# Patient Record
Sex: Male | Born: 1960 | Race: Black or African American | Hispanic: No | Marital: Single | Smoking: Never smoker
Health system: Southern US, Community
[De-identification: ages and names within clinical notes are randomized; demographics above are authoritative.]

## PROBLEM LIST (undated history)

## (undated) DIAGNOSIS — K3184 Gastroparesis: Secondary | ICD-10-CM

## (undated) DIAGNOSIS — N400 Enlarged prostate without lower urinary tract symptoms: Secondary | ICD-10-CM

## (undated) DIAGNOSIS — E1101 Type 2 diabetes mellitus with hyperosmolarity with coma: Secondary | ICD-10-CM

## (undated) DIAGNOSIS — F32A Depression, unspecified: Secondary | ICD-10-CM

## (undated) DIAGNOSIS — J189 Pneumonia, unspecified organism: Secondary | ICD-10-CM

## (undated) DIAGNOSIS — F329 Major depressive disorder, single episode, unspecified: Secondary | ICD-10-CM

## (undated) DIAGNOSIS — E871 Hypo-osmolality and hyponatremia: Secondary | ICD-10-CM

## (undated) DIAGNOSIS — R739 Hyperglycemia, unspecified: Secondary | ICD-10-CM

## (undated) DIAGNOSIS — F840 Autistic disorder: Secondary | ICD-10-CM

## (undated) DIAGNOSIS — R339 Retention of urine, unspecified: Secondary | ICD-10-CM

## (undated) DIAGNOSIS — F79 Unspecified intellectual disabilities: Secondary | ICD-10-CM

## (undated) DIAGNOSIS — F940 Selective mutism: Secondary | ICD-10-CM

## (undated) DIAGNOSIS — N390 Urinary tract infection, site not specified: Secondary | ICD-10-CM

## (undated) DIAGNOSIS — N289 Disorder of kidney and ureter, unspecified: Secondary | ICD-10-CM

---

## 2003-11-16 ENCOUNTER — Other Ambulatory Visit: Payer: Self-pay

## 2006-07-10 ENCOUNTER — Emergency Department: Payer: Self-pay | Admitting: Emergency Medicine

## 2009-07-27 ENCOUNTER — Emergency Department: Payer: Self-pay | Admitting: Emergency Medicine

## 2010-01-11 ENCOUNTER — Inpatient Hospital Stay: Payer: Self-pay | Admitting: Internal Medicine

## 2010-02-08 ENCOUNTER — Inpatient Hospital Stay: Payer: Self-pay | Admitting: Internal Medicine

## 2010-03-25 ENCOUNTER — Emergency Department: Payer: Self-pay | Admitting: Emergency Medicine

## 2010-05-13 ENCOUNTER — Emergency Department (HOSPITAL_COMMUNITY): Admission: EM | Admit: 2010-05-13 | Discharge: 2010-05-13 | Payer: Self-pay | Admitting: Emergency Medicine

## 2010-05-28 ENCOUNTER — Emergency Department (HOSPITAL_COMMUNITY): Admission: EM | Admit: 2010-05-28 | Discharge: 2010-05-29 | Payer: Self-pay | Admitting: Emergency Medicine

## 2010-06-19 ENCOUNTER — Inpatient Hospital Stay (HOSPITAL_COMMUNITY): Admission: EM | Admit: 2010-06-19 | Discharge: 2010-06-22 | Payer: Self-pay | Admitting: Emergency Medicine

## 2010-06-25 ENCOUNTER — Emergency Department (HOSPITAL_COMMUNITY): Admission: EM | Admit: 2010-06-25 | Discharge: 2010-06-25 | Payer: Self-pay | Admitting: Emergency Medicine

## 2010-07-11 ENCOUNTER — Emergency Department (HOSPITAL_COMMUNITY)
Admission: EM | Admit: 2010-07-11 | Discharge: 2010-07-11 | Payer: Self-pay | Source: Home / Self Care | Admitting: Emergency Medicine

## 2010-07-19 ENCOUNTER — Emergency Department (HOSPITAL_COMMUNITY): Admission: EM | Admit: 2010-07-19 | Discharge: 2010-07-19 | Payer: Self-pay | Admitting: Emergency Medicine

## 2010-07-25 ENCOUNTER — Emergency Department (HOSPITAL_COMMUNITY)
Admission: EM | Admit: 2010-07-25 | Discharge: 2010-07-25 | Payer: Self-pay | Source: Home / Self Care | Admitting: Emergency Medicine

## 2010-08-14 ENCOUNTER — Emergency Department (HOSPITAL_COMMUNITY)
Admission: EM | Admit: 2010-08-14 | Discharge: 2010-08-14 | Payer: Self-pay | Source: Home / Self Care | Admitting: Emergency Medicine

## 2010-09-24 ENCOUNTER — Emergency Department (HOSPITAL_COMMUNITY)
Admission: EM | Admit: 2010-09-24 | Discharge: 2010-09-24 | Payer: Self-pay | Source: Home / Self Care | Admitting: Emergency Medicine

## 2010-09-26 LAB — DIFFERENTIAL
Basophils Absolute: 0 10*3/uL (ref 0.0–0.1)
Basophils Relative: 1 % (ref 0–1)
Eosinophils Absolute: 0.1 10*3/uL (ref 0.0–0.7)
Neutro Abs: 4.5 10*3/uL (ref 1.7–7.7)
Neutrophils Relative %: 70 % (ref 43–77)

## 2010-09-26 LAB — BASIC METABOLIC PANEL
CO2: 27 mEq/L (ref 19–32)
Calcium: 9.2 mg/dL (ref 8.4–10.5)
Chloride: 100 mEq/L (ref 96–112)
GFR calc Af Amer: >60 mL/min (ref >60)
Sodium: 133 mEq/L — ABNORMAL LOW (ref 135–145)

## 2010-09-26 LAB — GLUCOSE, CAPILLARY
Glucose-Capillary: 52 mg/dL — ABNORMAL LOW (ref 70–99)
Glucose-Capillary: 165 mg/dL — ABNORMAL HIGH (ref 70–99)

## 2010-09-26 LAB — URINE MICROSCOPIC-ADD ON

## 2010-09-26 LAB — CBC
Hemoglobin: 11 g/dL — ABNORMAL LOW (ref 13.0–17.0)
Platelets: 181 10*3/uL (ref 150–400)
RBC: 4.16 MIL/uL — ABNORMAL LOW (ref 4.22–5.81)

## 2010-09-26 LAB — URINALYSIS, ROUTINE W REFLEX MICROSCOPIC
Specific Gravity, Urine: 1.01 (ref 1.005–1.030)
Urine Glucose, Fasting: NEGATIVE mg/dL
pH: 6 (ref 5.0–8.0)

## 2010-09-27 LAB — URINE CULTURE: Colony Count: >=100000

## 2010-11-14 LAB — BASIC METABOLIC PANEL
BUN: 29 mg/dL — ABNORMAL HIGH (ref 6–23)
BUN: 18 mg/dL (ref 6–23)
Calcium: 9 mg/dL (ref 8.4–10.5)
Calcium: 9.2 mg/dL (ref 8.4–10.5)
Calcium: 9.3 mg/dL (ref 8.4–10.5)
Chloride: 101 mEq/L (ref 96–112)
GFR calc non Af Amer: >60 mL/min (ref >60)
GFR calc non Af Amer: >60 mL/min (ref >60)
GFR calc non Af Amer: >60 mL/min (ref >60)
GFR calc non Af Amer: >60 mL/min (ref >60)
Glucose, Bld: 121 mg/dL — ABNORMAL HIGH (ref 70–99)
Glucose, Bld: 149 mg/dL — ABNORMAL HIGH (ref 70–99)
Glucose, Bld: 160 mg/dL — ABNORMAL HIGH (ref 70–99)
Potassium: 3.3 mEq/L — ABNORMAL LOW (ref 3.5–5.1)
Potassium: 3.5 mEq/L (ref 3.5–5.1)
Potassium: 3.5 mEq/L (ref 3.5–5.1)
Sodium: 135 mEq/L (ref 135–145)
Sodium: 139 mEq/L (ref 135–145)
Sodium: 142 mEq/L (ref 135–145)

## 2010-11-14 LAB — URINALYSIS, ROUTINE W REFLEX MICROSCOPIC
Bilirubin Urine: NEGATIVE
Glucose, UA: 100 mg/dL — AB
Nitrite: POSITIVE — AB
Specific Gravity, Urine: 1.02 (ref 1.005–1.030)
Specific Gravity, Urine: 1.02 (ref 1.005–1.030)
Urobilinogen, UA: 0.2 mg/dL (ref 0.0–1.0)
Urobilinogen, UA: 0.2 mg/dL (ref 0.0–1.0)
pH: 6.5 (ref 5.0–8.0)
pH: 6 (ref 5.0–8.0)

## 2010-11-14 LAB — URINE MICROSCOPIC-ADD ON

## 2010-11-14 LAB — DIFFERENTIAL
Basophils Absolute: 0 10*3/uL (ref 0.0–0.1)
Basophils Relative: 0 % (ref 0–1)
Lymphocytes Relative: 11 % — ABNORMAL LOW (ref 12–46)
Monocytes Relative: 6 % (ref 3–12)
Neutro Abs: 5.7 10*3/uL (ref 1.7–7.7)
Neutrophils Relative %: 82 % — ABNORMAL HIGH (ref 43–77)

## 2010-11-14 LAB — URINE CULTURE: Colony Count: >=100000

## 2010-11-14 LAB — GLUCOSE, CAPILLARY
Glucose-Capillary: 157 mg/dL — ABNORMAL HIGH (ref 70–99)
Glucose-Capillary: 161 mg/dL — ABNORMAL HIGH (ref 70–99)
Glucose-Capillary: 131 mg/dL — ABNORMAL HIGH (ref 70–99)
Glucose-Capillary: 154 mg/dL — ABNORMAL HIGH (ref 70–99)
Glucose-Capillary: 54 mg/dL — ABNORMAL LOW (ref 70–99)

## 2010-11-14 LAB — CBC
HCT: 32 % — ABNORMAL LOW (ref 39.0–52.0)
Hemoglobin: 10.6 g/dL — ABNORMAL LOW (ref 13.0–17.0)
Hemoglobin: 10.4 g/dL — ABNORMAL LOW (ref 13.0–17.0)
MCHC: 33.5 g/dL (ref 30.0–36.0)
MCV: 76.9 fL — ABNORMAL LOW (ref 78.0–100.0)
RDW: 14.3 % (ref 11.5–15.5)
RDW: 14.9 % (ref 11.5–15.5)
WBC: 6.3 10*3/uL (ref 4.0–10.5)
WBC: 6.9 10*3/uL (ref 4.0–10.5)

## 2010-11-14 LAB — LACTIC ACID, PLASMA: Lactic Acid, Venous: 2.5 mmol/L — ABNORMAL HIGH (ref 0.5–2.2)

## 2010-11-15 LAB — GLUCOSE, CAPILLARY
Glucose-Capillary: 115 mg/dL — ABNORMAL HIGH (ref 70–99)
Glucose-Capillary: 130 mg/dL — ABNORMAL HIGH (ref 70–99)
Glucose-Capillary: 140 mg/dL — ABNORMAL HIGH (ref 70–99)
Glucose-Capillary: 141 mg/dL — ABNORMAL HIGH (ref 70–99)
Glucose-Capillary: 143 mg/dL — ABNORMAL HIGH (ref 70–99)
Glucose-Capillary: 184 mg/dL — ABNORMAL HIGH (ref 70–99)
Glucose-Capillary: 203 mg/dL — ABNORMAL HIGH (ref 70–99)
Glucose-Capillary: 207 mg/dL — ABNORMAL HIGH (ref 70–99)
Glucose-Capillary: 228 mg/dL — ABNORMAL HIGH (ref 70–99)
Glucose-Capillary: 275 mg/dL — ABNORMAL HIGH (ref 70–99)
Glucose-Capillary: 304 mg/dL — ABNORMAL HIGH (ref 70–99)
Glucose-Capillary: 416 mg/dL — ABNORMAL HIGH (ref 70–99)
Glucose-Capillary: 55 mg/dL — ABNORMAL LOW (ref 70–99)
Glucose-Capillary: 58 mg/dL — ABNORMAL LOW (ref 70–99)
Glucose-Capillary: 60 mg/dL — ABNORMAL LOW (ref 70–99)
Glucose-Capillary: 61 mg/dL — ABNORMAL LOW (ref 70–99)
Glucose-Capillary: 67 mg/dL — ABNORMAL LOW (ref 70–99)
Glucose-Capillary: 96 mg/dL (ref 70–99)

## 2010-11-15 LAB — MAGNESIUM: Magnesium: 2.5 mg/dL (ref 1.5–2.5)

## 2010-11-15 LAB — URINE CULTURE

## 2010-11-15 LAB — URINALYSIS, ROUTINE W REFLEX MICROSCOPIC
Glucose, UA: >1000 mg/dL — AB
Leukocytes, UA: NEGATIVE
Nitrite: NEGATIVE
Specific Gravity, Urine: >1.03 — ABNORMAL HIGH (ref 1.005–1.030)
Urobilinogen, UA: 1 mg/dL (ref 0.0–1.0)
pH: 5.5 (ref 5.0–8.0)

## 2010-11-15 LAB — COMPREHENSIVE METABOLIC PANEL
ALT: 42 U/L (ref 0–53)
AST: 39 U/L — ABNORMAL HIGH (ref 0–37)
AST: 50 U/L — ABNORMAL HIGH (ref 0–37)
Albumin: 3.7 g/dL (ref 3.5–5.2)
Albumin: 4.5 g/dL (ref 3.5–5.2)
BUN: 53 mg/dL — ABNORMAL HIGH (ref 6–23)
CO2: 19 mEq/L (ref 19–32)
Calcium: 10 mg/dL (ref 8.4–10.5)
Calcium: 9 mg/dL (ref 8.4–10.5)
Calcium: 9.3 mg/dL (ref 8.4–10.5)
Creatinine, Ser: 2.19 mg/dL — ABNORMAL HIGH (ref 0.4–1.5)
Creatinine, Ser: 2.25 mg/dL — ABNORMAL HIGH (ref 0.4–1.5)
Creatinine, Ser: 2.91 mg/dL — ABNORMAL HIGH (ref 0.4–1.5)
GFR calc Af Amer: 38 mL/min — ABNORMAL LOW (ref >60)
GFR calc Af Amer: 39 mL/min — ABNORMAL LOW (ref >60)
GFR calc non Af Amer: 32 mL/min — ABNORMAL LOW (ref >60)
Glucose, Bld: 434 mg/dL — ABNORMAL HIGH (ref 70–99)
Sodium: 133 mEq/L — ABNORMAL LOW (ref 135–145)
Total Bilirubin: 1.3 mg/dL — ABNORMAL HIGH (ref 0.3–1.2)
Total Protein: 6.8 g/dL (ref 6.0–8.3)
Total Protein: 8.5 g/dL — ABNORMAL HIGH (ref 6.0–8.3)

## 2010-11-15 LAB — HEMOGLOBIN A1C: Hgb A1c MFr Bld: 9.4 % — ABNORMAL HIGH (ref <5.7)

## 2010-11-15 LAB — BASIC METABOLIC PANEL
BUN: 25 mg/dL — ABNORMAL HIGH (ref 6–23)
Chloride: 110 mEq/L (ref 96–112)
Creatinine, Ser: 1.43 mg/dL (ref 0.4–1.5)
Glucose, Bld: 126 mg/dL — ABNORMAL HIGH (ref 70–99)

## 2010-11-15 LAB — CBC
MCH: 26.7 pg (ref 26.0–34.0)
MCH: 26.9 pg (ref 26.0–34.0)
MCH: 26.9 pg (ref 26.0–34.0)
MCHC: 33.7 g/dL (ref 30.0–36.0)
MCHC: 34 g/dL (ref 30.0–36.0)
MCHC: 34.2 g/dL (ref 30.0–36.0)
MCV: 78.8 fL (ref 78.0–100.0)
Platelets: 186 10*3/uL (ref 150–400)
Platelets: 198 10*3/uL (ref 150–400)
RDW: 14.3 % (ref 11.5–15.5)
RDW: 14.4 % (ref 11.5–15.5)
RDW: 14.9 % (ref 11.5–15.5)

## 2010-11-15 LAB — DIFFERENTIAL
Basophils Absolute: 0.2 10*3/uL — ABNORMAL HIGH (ref 0.0–0.1)
Eosinophils Absolute: 0 10*3/uL (ref 0.0–0.7)
Eosinophils Relative: 1 % (ref 0–5)
Lymphocytes Relative: 12 % (ref 12–46)
Lymphocytes Relative: 20 % (ref 12–46)
Lymphocytes Relative: 22 % (ref 12–46)
Lymphs Abs: 0.9 10*3/uL (ref 0.7–4.0)
Lymphs Abs: 1.7 10*3/uL (ref 0.7–4.0)
Monocytes Absolute: 0.8 10*3/uL (ref 0.1–1.0)
Monocytes Absolute: 0.8 10*3/uL (ref 0.1–1.0)
Monocytes Relative: 8 % (ref 3–12)
Monocytes Relative: 9 % (ref 3–12)
Neutro Abs: 5.3 10*3/uL (ref 1.7–7.7)
Neutrophils Relative %: 79 % — ABNORMAL HIGH (ref 43–77)

## 2010-11-15 LAB — URINE MICROSCOPIC-ADD ON

## 2010-11-15 LAB — KETONES, QUALITATIVE

## 2010-11-15 LAB — LIPID PANEL
Cholesterol: 202 mg/dL — ABNORMAL HIGH (ref 0–200)
Total CHOL/HDL Ratio: 3.8 RATIO

## 2010-11-15 LAB — PHOSPHORUS: Phosphorus: 3.1 mg/dL (ref 2.3–4.6)

## 2010-11-15 LAB — TSH: TSH: 0.507 u[IU]/mL (ref 0.350–4.500)

## 2010-11-16 LAB — COMPREHENSIVE METABOLIC PANEL WITH GFR
ALT: 37 U/L (ref 0–53)
AST: 50 U/L — ABNORMAL HIGH (ref 0–37)
Albumin: 3.8 g/dL (ref 3.5–5.2)
Alkaline Phosphatase: 79 U/L (ref 39–117)
BUN: 24 mg/dL — ABNORMAL HIGH (ref 6–23)
CO2: 24 meq/L (ref 19–32)
Calcium: 9.3 mg/dL (ref 8.4–10.5)
Chloride: 108 meq/L (ref 96–112)
Creatinine, Ser: 1.53 mg/dL — ABNORMAL HIGH (ref 0.4–1.5)
GFR calc non Af Amer: 49 mL/min — ABNORMAL LOW
Glucose, Bld: 56 mg/dL — ABNORMAL LOW (ref 70–99)
Potassium: 3.9 meq/L (ref 3.5–5.1)
Sodium: 140 meq/L (ref 135–145)
Total Bilirubin: 1 mg/dL (ref 0.3–1.2)
Total Protein: 7.4 g/dL (ref 6.0–8.3)

## 2010-11-16 LAB — URINE MICROSCOPIC-ADD ON

## 2010-11-16 LAB — DIFFERENTIAL
Basophils Absolute: 0.1 10*3/uL (ref 0.0–0.1)
Basophils Relative: 1 % (ref 0–1)
Lymphocytes Relative: 26 % (ref 12–46)
Monocytes Relative: 9 % (ref 3–12)
Neutro Abs: 4.3 10*3/uL (ref 1.7–7.7)
Neutrophils Relative %: 61 % (ref 43–77)

## 2010-11-16 LAB — URINE CULTURE
Colony Count: >=100000
Culture  Setup Time: 201109262016

## 2010-11-16 LAB — CBC
HCT: 33.9 % — ABNORMAL LOW (ref 39.0–52.0)
MCH: 26.6 pg (ref 26.0–34.0)
MCV: 79.3 fL (ref 78.0–100.0)
RDW: 14.3 % (ref 11.5–15.5)
WBC: 7 10*3/uL (ref 4.0–10.5)

## 2010-11-16 LAB — GLUCOSE, CAPILLARY
Glucose-Capillary: 119 mg/dL — ABNORMAL HIGH (ref 70–99)
Glucose-Capillary: 79 mg/dL (ref 70–99)

## 2010-11-16 LAB — URINALYSIS, ROUTINE W REFLEX MICROSCOPIC
Nitrite: POSITIVE — AB
Specific Gravity, Urine: 1.02 (ref 1.005–1.030)
Urobilinogen, UA: 1 mg/dL (ref 0.0–1.0)

## 2010-11-30 ENCOUNTER — Emergency Department (HOSPITAL_COMMUNITY): Payer: Medicaid Other

## 2010-11-30 ENCOUNTER — Emergency Department (HOSPITAL_COMMUNITY)
Admission: EM | Admit: 2010-11-30 | Discharge: 2010-11-30 | Disposition: A | Discharge status: Home / Self Care | Payer: Medicaid Other | Attending: Emergency Medicine | Admitting: Emergency Medicine

## 2010-11-30 DIAGNOSIS — R209 Unspecified disturbances of skin sensation: Secondary | ICD-10-CM | POA: Insufficient documentation

## 2010-11-30 DIAGNOSIS — I10 Essential (primary) hypertension: Secondary | ICD-10-CM | POA: Insufficient documentation

## 2010-11-30 DIAGNOSIS — Z79899 Other long term (current) drug therapy: Secondary | ICD-10-CM | POA: Insufficient documentation

## 2010-11-30 DIAGNOSIS — N390 Urinary tract infection, site not specified: Secondary | ICD-10-CM | POA: Insufficient documentation

## 2010-11-30 DIAGNOSIS — E1169 Type 2 diabetes mellitus with other specified complication: Secondary | ICD-10-CM | POA: Insufficient documentation

## 2010-11-30 LAB — GLUCOSE, CAPILLARY: Glucose-Capillary: 143 mg/dL — ABNORMAL HIGH (ref 70–99)

## 2010-11-30 LAB — URINE MICROSCOPIC-ADD ON

## 2010-11-30 LAB — URINALYSIS, ROUTINE W REFLEX MICROSCOPIC
Bilirubin Urine: NEGATIVE
Ketones, ur: NEGATIVE mg/dL
Nitrite: NEGATIVE
Protein, ur: NEGATIVE mg/dL
pH: 7 (ref 5.0–8.0)

## 2010-12-02 ENCOUNTER — Emergency Department (HOSPITAL_COMMUNITY)
Admission: EM | Admit: 2010-12-02 | Discharge: 2010-12-02 | Disposition: A | Discharge status: Home / Self Care | Payer: Medicaid Other | Attending: Emergency Medicine | Admitting: Emergency Medicine

## 2010-12-02 DIAGNOSIS — F3289 Other specified depressive episodes: Secondary | ICD-10-CM | POA: Insufficient documentation

## 2010-12-02 DIAGNOSIS — F840 Autistic disorder: Secondary | ICD-10-CM | POA: Insufficient documentation

## 2010-12-02 DIAGNOSIS — F329 Major depressive disorder, single episode, unspecified: Secondary | ICD-10-CM | POA: Insufficient documentation

## 2010-12-02 DIAGNOSIS — I10 Essential (primary) hypertension: Secondary | ICD-10-CM | POA: Insufficient documentation

## 2010-12-02 DIAGNOSIS — E1169 Type 2 diabetes mellitus with other specified complication: Secondary | ICD-10-CM | POA: Insufficient documentation

## 2010-12-02 LAB — BASIC METABOLIC PANEL
CO2: 28 mEq/L (ref 19–32)
Chloride: 101 mEq/L (ref 96–112)
GFR calc Af Amer: >60 mL/min (ref >60)
Sodium: 137 mEq/L (ref 135–145)

## 2010-12-02 LAB — CBC
HCT: 33.4 % — ABNORMAL LOW (ref 39.0–52.0)
Hemoglobin: 10.7 g/dL — ABNORMAL LOW (ref 13.0–17.0)
MCH: 25.8 pg — ABNORMAL LOW (ref 26.0–34.0)
MCHC: 32 g/dL (ref 30.0–36.0)
MCV: 80.7 fL (ref 78.0–100.0)

## 2010-12-02 LAB — DIFFERENTIAL
Lymphocytes Relative: 14 % (ref 12–46)
Monocytes Absolute: 0.5 10*3/uL (ref 0.1–1.0)
Monocytes Relative: 8 % (ref 3–12)
Neutro Abs: 4.6 10*3/uL (ref 1.7–7.7)

## 2010-12-02 LAB — GLUCOSE, CAPILLARY: Glucose-Capillary: 406 mg/dL — ABNORMAL HIGH (ref 70–99)

## 2010-12-04 LAB — URINE CULTURE: Colony Count: >=100000

## 2010-12-16 ENCOUNTER — Inpatient Hospital Stay (HOSPITAL_COMMUNITY)
Admission: EM | Admit: 2010-12-16 | Discharge: 2010-12-25 | DRG: 638 | Disposition: A | Discharge status: Nursing Home (Includes ICF) | Payer: Medicaid Other | Attending: Internal Medicine | Admitting: Internal Medicine

## 2010-12-16 ENCOUNTER — Emergency Department (HOSPITAL_COMMUNITY): Payer: Medicaid Other

## 2010-12-16 DIAGNOSIS — E1069 Type 1 diabetes mellitus with other specified complication: Principal | ICD-10-CM | POA: Diagnosis present

## 2010-12-16 DIAGNOSIS — R339 Retention of urine, unspecified: Secondary | ICD-10-CM | POA: Diagnosis present

## 2010-12-16 DIAGNOSIS — E871 Hypo-osmolality and hyponatremia: Secondary | ICD-10-CM | POA: Diagnosis present

## 2010-12-16 DIAGNOSIS — N390 Urinary tract infection, site not specified: Secondary | ICD-10-CM | POA: Diagnosis present

## 2010-12-16 DIAGNOSIS — E1065 Type 1 diabetes mellitus with hyperglycemia: Principal | ICD-10-CM | POA: Diagnosis present

## 2010-12-16 DIAGNOSIS — F79 Unspecified intellectual disabilities: Secondary | ICD-10-CM | POA: Diagnosis present

## 2010-12-16 DIAGNOSIS — E870 Hyperosmolality and hypernatremia: Principal | ICD-10-CM | POA: Diagnosis present

## 2010-12-16 DIAGNOSIS — F840 Autistic disorder: Secondary | ICD-10-CM | POA: Diagnosis present

## 2010-12-16 DIAGNOSIS — B965 Pseudomonas (aeruginosa) (mallei) (pseudomallei) as the cause of diseases classified elsewhere: Secondary | ICD-10-CM | POA: Diagnosis present

## 2010-12-16 DIAGNOSIS — Z794 Long term (current) use of insulin: Secondary | ICD-10-CM

## 2010-12-16 LAB — BLOOD GAS, ARTERIAL
Acid-base deficit: 6.8 mmol/L — ABNORMAL HIGH (ref 0.0–2.0)
pCO2 arterial: 31.9 mmHg — ABNORMAL LOW (ref 35.0–45.0)
pO2, Arterial: 60.9 mmHg — ABNORMAL LOW (ref 80.0–100.0)

## 2010-12-16 LAB — BASIC METABOLIC PANEL
BUN: 28 mg/dL — ABNORMAL HIGH (ref 6–23)
CO2: 22 mEq/L (ref 19–32)
Calcium: 9.3 mg/dL (ref 8.4–10.5)
Creatinine, Ser: 1.83 mg/dL — ABNORMAL HIGH (ref 0.4–1.5)
Glucose, Bld: 630 mg/dL (ref 70–99)

## 2010-12-16 LAB — URINALYSIS, ROUTINE W REFLEX MICROSCOPIC
Glucose, UA: >1000 mg/dL — AB
Nitrite: POSITIVE — AB
Protein, ur: NEGATIVE mg/dL
Urobilinogen, UA: 0.2 mg/dL (ref 0.0–1.0)

## 2010-12-16 LAB — DIFFERENTIAL
Eosinophils Absolute: 0.1 10*3/uL (ref 0.0–0.7)
Lymphocytes Relative: 10 % — ABNORMAL LOW (ref 12–46)
Lymphs Abs: 0.7 10*3/uL (ref 0.7–4.0)
Neutrophils Relative %: 84 % — ABNORMAL HIGH (ref 43–77)

## 2010-12-16 LAB — GLUCOSE, CAPILLARY
Glucose-Capillary: 439 mg/dL — ABNORMAL HIGH (ref 70–99)
Glucose-Capillary: 160 mg/dL — ABNORMAL HIGH (ref 70–99)
Glucose-Capillary: 201 mg/dL — ABNORMAL HIGH (ref 70–99)
Glucose-Capillary: 138 mg/dL — ABNORMAL HIGH (ref 70–99)

## 2010-12-16 LAB — CBC
MCV: 83.2 fL (ref 78.0–100.0)
Platelets: 187 10*3/uL (ref 150–400)
RBC: 3.98 MIL/uL — ABNORMAL LOW (ref 4.22–5.81)
WBC: 7.4 10*3/uL (ref 4.0–10.5)

## 2010-12-16 LAB — URINE MICROSCOPIC-ADD ON

## 2010-12-16 LAB — MRSA PCR SCREENING: MRSA by PCR: POSITIVE — AB

## 2010-12-17 LAB — BASIC METABOLIC PANEL
BUN: 22 mg/dL (ref 6–23)
CO2: 25 mEq/L (ref 19–32)
Glucose, Bld: 317 mg/dL — ABNORMAL HIGH (ref 70–99)
Potassium: 4.2 mEq/L (ref 3.5–5.1)
Sodium: 133 mEq/L — ABNORMAL LOW (ref 135–145)

## 2010-12-17 LAB — GLUCOSE, CAPILLARY
Glucose-Capillary: 298 mg/dL — ABNORMAL HIGH (ref 70–99)
Glucose-Capillary: 311 mg/dL — ABNORMAL HIGH (ref 70–99)
Glucose-Capillary: 210 mg/dL — ABNORMAL HIGH (ref 70–99)
Glucose-Capillary: 46 mg/dL — ABNORMAL LOW (ref 70–99)
Glucose-Capillary: 154 mg/dL — ABNORMAL HIGH (ref 70–99)

## 2010-12-17 LAB — LACTIC ACID, PLASMA: Lactic Acid, Venous: 2.1 mmol/L (ref 0.5–2.2)

## 2010-12-18 LAB — BASIC METABOLIC PANEL
BUN: 17 mg/dL (ref 6–23)
CO2: 25 mEq/L (ref 19–32)
Chloride: 105 mEq/L (ref 96–112)
Creatinine, Ser: 1.14 mg/dL (ref 0.4–1.5)
GFR calc Af Amer: >60 mL/min (ref >60)
Potassium: 3.6 mEq/L (ref 3.5–5.1)

## 2010-12-18 LAB — GLUCOSE, CAPILLARY
Glucose-Capillary: 167 mg/dL — ABNORMAL HIGH (ref 70–99)
Glucose-Capillary: 55 mg/dL — ABNORMAL LOW (ref 70–99)
Glucose-Capillary: 209 mg/dL — ABNORMAL HIGH (ref 70–99)
Glucose-Capillary: 181 mg/dL — ABNORMAL HIGH (ref 70–99)
Glucose-Capillary: 134 mg/dL — ABNORMAL HIGH (ref 70–99)

## 2010-12-18 LAB — CBC
Hemoglobin: 10.4 g/dL — ABNORMAL LOW (ref 13.0–17.0)
MCH: 26.3 pg (ref 26.0–34.0)
MCV: 81.8 fL (ref 78.0–100.0)
Platelets: 204 10*3/uL (ref 150–400)
RBC: 3.95 MIL/uL — ABNORMAL LOW (ref 4.22–5.81)
WBC: 5.5 10*3/uL (ref 4.0–10.5)

## 2010-12-18 LAB — DIFFERENTIAL
Lymphs Abs: 2 10*3/uL (ref 0.7–4.0)
Monocytes Relative: 8 % (ref 3–12)
Neutro Abs: 2.7 10*3/uL (ref 1.7–7.7)
Neutrophils Relative %: 49 % (ref 43–77)

## 2010-12-19 LAB — GLUCOSE, CAPILLARY
Glucose-Capillary: 346 mg/dL — ABNORMAL HIGH (ref 70–99)
Glucose-Capillary: 55 mg/dL — ABNORMAL LOW (ref 70–99)

## 2010-12-19 LAB — URINE CULTURE

## 2010-12-19 LAB — BASIC METABOLIC PANEL
BUN: 16 mg/dL (ref 6–23)
CO2: 25 mEq/L (ref 19–32)
Glucose, Bld: 173 mg/dL — ABNORMAL HIGH (ref 70–99)
Potassium: 3.8 mEq/L (ref 3.5–5.1)
Sodium: 136 mEq/L (ref 135–145)

## 2010-12-20 LAB — BASIC METABOLIC PANEL
BUN: 22 mg/dL (ref 6–23)
Chloride: 101 mEq/L (ref 96–112)
GFR calc Af Amer: >60 mL/min (ref >60)
GFR calc non Af Amer: 51 mL/min — ABNORMAL LOW (ref >60)
Potassium: 3.8 mEq/L (ref 3.5–5.1)
Sodium: 134 mEq/L — ABNORMAL LOW (ref 135–145)

## 2010-12-20 LAB — GLUCOSE, CAPILLARY
Glucose-Capillary: 231 mg/dL — ABNORMAL HIGH (ref 70–99)
Glucose-Capillary: 255 mg/dL — ABNORMAL HIGH (ref 70–99)
Glucose-Capillary: 386 mg/dL — ABNORMAL HIGH (ref 70–99)
Glucose-Capillary: 66 mg/dL — ABNORMAL LOW (ref 70–99)

## 2010-12-20 NOTE — Consult Note (Signed)
NAME:  [name], [name]           ACCOUNT NO.:  [id number]  MEDICAL RECORD NO.:  [id number]           PATIENT TYPE:  I  LOCATION:  IC10                          FACILITY:  [facility]  PHYSICIAN:  Barrington Worley, MD DATE OF BIRTH:  1960/09/13  DATE OF CONSULTATION:  12/19/2010 DATE OF DISCHARGE:                                CONSULTATION   REASON FOR CONSULT:  Diabetes.  HISTORY OF PRESENT ILLNESS:  This is a 50 year old African American gentleman with multiple medical problems including depression, gastroparesis, psychotic disorders, mutism, metal retardation, urinary retention, and indwelling Foley catheter, type 1 diabetes since childhood, and benign prostatic hypertrophy.  The patient is known to me from outpatient visit where he was accompanied by his caretaker several weeks ago and was found to have high A1c and for which he was put on basal bolus insulin utilizing Lantus and NovoLog and was brought to ER for hyperglycemia and was found to have glucose of 630 with nonketotic hyperosmolar state.  He had hyponatremia of 127, subsequently hospitalized to ICU where he received IV insulin per Glucommander protocol.  He now has stabilized his glycemia near normal, however, it stayed fluctuations.  He did not have fever, chest pain, or shortness of breath.  Endocrinology consult was requested to readjust the insulin dosage.  PAST MEDICAL HISTORY:  As above.  PAST SURGICAL HISTORY:  Benign prostatic hypertrophy, indwelling urinary Foley catheter.  SOCIAL HISTORY:  Autism, mental retardation.  Negative for smoking, alcohol, or drug use.  HOME MEDICATIONS: 1. NovoLog 10 units t.i.d. a.c. per sliding scale. 2. Lantus 30 units at bedtime. 3. Geodon 40 mg p.o. b.i.d. 4. Milk of magnesia. 5. Sertraline. 6. Flomax. 7. Trazodone. 8. Metoclopramide.  REVIEW OF SYSTEMS:  Unobtainable at this point.  PHYSICAL EXAMINATION:  GENERAL:  He is alert and awake, not in  acute distress. VITAL SIGNS:  Blood pressure 129/89, pulse rate 80, and temperature 98.1. HEENT:  Moist mucous membranes.  No JVD.  No thyromegaly. CHEST:  Clear to auscultation bilaterally. CEREBROVASCULAR:  Normal S1 and S2.  No murmur, no gallop. ABDOMEN:  Soft and nontender. EXTREMITIES:  No edema. CENTRAL NERVOUS SYSTEM:  Nonfocal.  The patient is aphasic.  His most recent labs showed sodium 136, potassium 3.8, chloride 101, bicarb 25, glucose 173, BUN 16, creatinine 1.25, and glucose 190.  ASSESSMENT/PLAN:  Type 1 diabetes, presenting with impending diabetic ketoacidosis with severe hyperglycemia and hyponatremia, currently status post stabilization using IV insulin, which was subsequently switched basal bolus insulin using Lantus and NovoLog.  Currently, his glycemic excursions are due to his varying oral intake.  I wound advise to continue the same dose of insulin which is significantly currently lower than his home dosage.  This needs to be followed and adjusted according to fingerstick readings.  I will continue to follow up the patient since he is now metabolically stable.  He can be treated on regular floor status post to an ICU.          ______________________________ Loni Beckwith, MD     GN/MEDQ  D:  12/19/2010  T:  12/19/2010  Job:  [id number]  Electronically Signed by Concha Se  Cinthya Bors MD on 12/20/2010 11:22:42 AM

## 2010-12-21 LAB — BASIC METABOLIC PANEL
BUN: 21 mg/dL (ref 6–23)
Creatinine, Ser: 1.26 mg/dL (ref 0.4–1.5)
GFR calc non Af Amer: >60 mL/min (ref >60)
Glucose, Bld: 113 mg/dL — ABNORMAL HIGH (ref 70–99)
Potassium: 4.1 mEq/L (ref 3.5–5.1)

## 2010-12-21 LAB — GLUCOSE, CAPILLARY: Glucose-Capillary: 79 mg/dL (ref 70–99)

## 2010-12-22 LAB — BASIC METABOLIC PANEL
Calcium: 9.4 mg/dL (ref 8.4–10.5)
Chloride: 105 mEq/L (ref 96–112)
Creatinine, Ser: 1.16 mg/dL (ref 0.4–1.5)
GFR calc Af Amer: >60 mL/min (ref >60)
Sodium: 138 mEq/L (ref 135–145)

## 2010-12-22 LAB — GLUCOSE, CAPILLARY
Glucose-Capillary: 47 mg/dL — ABNORMAL LOW (ref 70–99)
Glucose-Capillary: 184 mg/dL — ABNORMAL HIGH (ref 70–99)
Glucose-Capillary: 140 mg/dL — ABNORMAL HIGH (ref 70–99)
Glucose-Capillary: 143 mg/dL — ABNORMAL HIGH (ref 70–99)

## 2010-12-23 LAB — GLUCOSE, CAPILLARY
Glucose-Capillary: 170 mg/dL — ABNORMAL HIGH (ref 70–99)
Glucose-Capillary: 122 mg/dL — ABNORMAL HIGH (ref 70–99)

## 2010-12-23 LAB — BASIC METABOLIC PANEL
BUN: 18 mg/dL (ref 6–23)
Calcium: 9 mg/dL (ref 8.4–10.5)
Chloride: 106 mEq/L (ref 96–112)
GFR calc Af Amer: >60 mL/min (ref >60)
Glucose, Bld: 46 mg/dL — ABNORMAL LOW (ref 70–99)
Potassium: 3.5 mEq/L (ref 3.5–5.1)

## 2010-12-24 LAB — CBC
HCT: 33.8 % — ABNORMAL LOW (ref 39.0–52.0)
Hemoglobin: 11 g/dL — ABNORMAL LOW (ref 13.0–17.0)
MCH: 26.6 pg (ref 26.0–34.0)
MCHC: 32.5 g/dL (ref 30.0–36.0)
MCV: 81.6 fL (ref 78.0–100.0)

## 2010-12-24 LAB — DIFFERENTIAL
Lymphocytes Relative: 29 % (ref 12–46)
Monocytes Absolute: 0.5 10*3/uL (ref 0.1–1.0)
Monocytes Relative: 8 % (ref 3–12)
Neutro Abs: 3.3 10*3/uL (ref 1.7–7.7)

## 2010-12-24 LAB — URINALYSIS, ROUTINE W REFLEX MICROSCOPIC
Bilirubin Urine: NEGATIVE
Ketones, ur: NEGATIVE mg/dL
Nitrite: NEGATIVE
Specific Gravity, Urine: 1.025 (ref 1.005–1.030)
Urobilinogen, UA: 0.2 mg/dL (ref 0.0–1.0)

## 2010-12-24 LAB — GLUCOSE, CAPILLARY: Glucose-Capillary: 220 mg/dL — ABNORMAL HIGH (ref 70–99)

## 2010-12-24 LAB — BASIC METABOLIC PANEL
CO2: 25 mEq/L (ref 19–32)
Calcium: 9 mg/dL (ref 8.4–10.5)
Creatinine, Ser: 1.15 mg/dL (ref 0.4–1.5)
Glucose, Bld: 290 mg/dL — ABNORMAL HIGH (ref 70–99)
Sodium: 136 mEq/L (ref 135–145)

## 2010-12-25 LAB — GLUCOSE, CAPILLARY: Glucose-Capillary: 223 mg/dL — ABNORMAL HIGH (ref 70–99)

## 2010-12-25 LAB — BASIC METABOLIC PANEL
GFR calc non Af Amer: 59 mL/min — ABNORMAL LOW (ref >60)
Glucose, Bld: 380 mg/dL — ABNORMAL HIGH (ref 70–99)
Potassium: 4.2 mEq/L (ref 3.5–5.1)
Sodium: 134 mEq/L — ABNORMAL LOW (ref 135–145)

## 2011-01-02 NOTE — H&P (Signed)
NAME:  [name], [name]           ACCOUNT NO.:  [id number]  MEDICAL RECORD NO.:  [id number]           PATIENT TYPE:  I  LOCATION:  IC10                          FACILITY:  [facility]  PHYSICIAN:  Unk Lightning, MDDATE OF BIRTH:  [birth year]  DATE OF ADMISSION:  12/16/2010 DATE OF DISCHARGE:  LH                             HISTORY & PHYSICAL   The patient is a 51 year old black male nursing home resident, patient of Dr. Legrand Rams who has extensive history of mental retardation, history of psychotic disorder, autism and mutism who apparently has glucose checked and was in excess of high and he was seen in the ER and found to have a glucose of 630 and essentially with a nonketotic hyperosmolar state. His sodium was mildly diminished at 127 corrected was fairly normal with mild renal failure.  He had dry parched mucosa.  He does not give a history.  He is not verbal and the patient will be placed in ICU on Glucommander protocol with attention to fluid resuscitation, electrolyte abnormalities and glycemic control.  He has a indwelling Foley catheter and his urine shows a few white cells.  I am not sure if this was UTI or just chronic colonization.  We will send UACNS to sort this.  PAST MEDICAL HISTORY:  Significant for insulin-dependent diabetes, benign prostatic hypertrophy, depression, gastroparesis, psychotic disorder, mutism, mental retardation, autism, urinary retention, indwelling Foley catheter, status post septicemia.  CURRENT MEDICINES: 1. Novolin 70/30 given for sliding scale regimen. 2. Geodon 40 p.o. b.i.d. 3. Milk of magnesia 2.5 mL p.o. daily p.r.n. 4. Ranitidine 150 p.o. b.i.d.. 5. Sertraline 100 mg p.o. daily. 6. Flomax 0.4 mg p.o. daily. 7. Trazodone 50 mg p.o. at bedtime. 8. Lantus insulin 30 units at bedtime daily. 9. Metoclopramide 10 mg a.c. t.i.d.  PHYSICAL EXAMINATION:  VITAL SIGNS:  Blood pressure is 145/57, temperature 97.7, pulse is 86 and regular,  respiratory rate is 17. EYES:  PERRLA.  Extraocular movements intact.  Sclerae clear. Conjunctivae pink. NECK:  No JVD, no carotid bruits, no thyromegaly, no thyroid bruits. TONGUE:  Dry partial mucosa. LUNGS:  Clear to A and P.  No rales, wheezes or rhonchi appreciable. Diminished breath sounds at base. HEART:  Regular rhythm, 1/6 aortic outflow murmur, systolic.  No S3, S4, or gallops.  No heaves, thrills, or rubs. ABDOMEN:  Soft and nontender.  Bowel sounds are normoactive.  No guarding, rebound, masses, or hepatosplenomegaly. EXTREMITIES:  Trace to 1+ pedal edema.  The patient moves all four extremities.  He is nonverbal due to aforementioned conditions.  IMPRESSION: 1. Hyperosmolar nonketotic state. 2. Hyponatremia with a sodium of 127, mild chronic renal failure with     a creatinine of 1.83, mild anemia with hemoglobin of 10.7,     indwelling Foley catheter and mental retardation, autism, mutism     and depression as well as benign prostatic hypertrophy and insulin-     dependent diabetes.  PLAN:  At present is to continue all current medicines, put him on ICU, Glucommander protocol when the patient __________ altered glycemic scheduling and I will make further recommendations as the database expands.  We will also  send UACNS.     Unk Lightning, MD     RMD/MEDQ  D:  12/16/2010  T:  12/17/2010  Job:  [id number]  Electronically Signed by Lucia Gaskins MD on 01/02/2011 01:43:23 PM

## 2011-01-09 NOTE — Discharge Summary (Signed)
  NAME:  [name], [name]           ACCOUNT NO.:  [id number]  MEDICAL RECORD NO.:  [id number]           PATIENT TYPE:  I  LOCATION:  A325                          FACILITY:  [facility]  PHYSICIAN:  Leialoha Hanna, MD DATE OF BIRTH:  Jan 17, 1961  DATE OF ADMISSION:  12/16/2010 DATE OF DISCHARGE:  LH                              DISCHARGE SUMMARY   DISCHARGE DIAGNOSES: 1. Type 1 diabetes, chronically uncontrolled. 2. Impending diabetic ketoacidosis with severe hyperglycemia.  This is a 50 year old African American gentleman with multiple medical problems including type 1 diabetes since early age, gastroparesis, mutism, mental retardation, depression, urinary retention with indwelling Foley catheter came in with severe hyperglycemia with hyponatremia with impending DKA.  The patient was subsequently put on IV insulin for glycemic stabilization utilizing Glucommander with achievement of near normal glycemia.  However, subsequently the patient continued to have fluctuation in glucose for which his basal insulin had to be readjusted several times.  He was also diagnosed with Pseudomonas aeruginosa UTI, placed on multiple antibiotics for which he was treated with IV ceftazidime and p.o. ciprofloxacin.  Endocrinology consult was utilized while he was in the hospital for these insulin adjustments.  DISCHARGE MEDICATIONS: 1. Insulin glargine 20 units nightly. 2. Insulin NovoLog 5 units t.i.d. a.c. plus moderate or sliding scale. 3. Zoloft  100 mg p.o. daily. 4. Flomax 0.4 mg p.o. daily. 5. Trazodone 50 mg p.o. daily. 6. Geodon 40 mg p.o. daily. 7. Acetaminophen 500 mg every 6 hours p.o. p.r.n. pain. 8. Dulcolax suppository 10 mg PR every 4 hours. 9. Metoclopramide as needed.  DISCHARGE DIAGNOSES:  Severe hyperglycemia with impending diabetic ketoacidosis, uncontrolled type 1 diabetes, Pseudomonas aeruginosa UTI, hyponatremia.  PROCEDURES:  None.  DISCHARGE INSTRUCTIONS:  The patient  will continue his outpatient endocrinology followup for management of type 1 diabetes.  He will continue with indwelling catheter and outpatient followup with his urologist for catheter change as scheduled.  He will see his primary care physician Dr. Dorris Fetch in 1 week after discharge and his urologist for his Foley catheter issues in 2 weeks.                                           ______________________________ Loni Beckwith, MD     GN/MEDQ  D:  12/25/2010  T:  12/25/2010  Job:  [id number]  Electronically Signed by Loni Beckwith MD on 01/09/2011 01:30:51 PM

## 2011-01-14 ENCOUNTER — Emergency Department (HOSPITAL_COMMUNITY)
Admission: EM | Admit: 2011-01-14 | Discharge: 2011-01-14 | Disposition: A | Discharge status: Home Health | Payer: Medicaid Other | Attending: Emergency Medicine | Admitting: Emergency Medicine

## 2011-01-14 DIAGNOSIS — E1169 Type 2 diabetes mellitus with other specified complication: Secondary | ICD-10-CM | POA: Insufficient documentation

## 2011-01-14 LAB — BASIC METABOLIC PANEL
BUN: 26 mg/dL — ABNORMAL HIGH (ref 6–23)
CO2: 26 mEq/L (ref 19–32)
GFR calc non Af Amer: 48 mL/min — ABNORMAL LOW (ref >60)
Glucose, Bld: 118 mg/dL — ABNORMAL HIGH (ref 70–99)
Potassium: 3.7 mEq/L (ref 3.5–5.1)
Sodium: 136 mEq/L (ref 135–145)

## 2011-01-14 LAB — CBC
HCT: 33.6 % — ABNORMAL LOW (ref 39.0–52.0)
Hemoglobin: 10.7 g/dL — ABNORMAL LOW (ref 13.0–17.0)
MCV: 82.2 fL (ref 78.0–100.0)
RDW: 14 % (ref 11.5–15.5)
WBC: 6.4 10*3/uL (ref 4.0–10.5)

## 2011-01-14 LAB — DIFFERENTIAL
Basophils Relative: 1 % (ref 0–1)
Eosinophils Absolute: 0.2 10*3/uL (ref 0.0–0.7)
Eosinophils Relative: 3 % (ref 0–5)
Monocytes Absolute: 0.5 10*3/uL (ref 0.1–1.0)
Monocytes Relative: 8 % (ref 3–12)
Neutrophils Relative %: 65 % (ref 43–77)

## 2011-01-14 LAB — GLUCOSE, CAPILLARY: Glucose-Capillary: 160 mg/dL — ABNORMAL HIGH (ref 70–99)

## 2011-01-15 ENCOUNTER — Emergency Department (HOSPITAL_COMMUNITY)
Admission: EM | Admit: 2011-01-15 | Discharge: 2011-01-15 | Disposition: A | Discharge status: Home / Self Care | Payer: Medicaid Other | Attending: Emergency Medicine | Admitting: Emergency Medicine

## 2011-01-15 DIAGNOSIS — F79 Unspecified intellectual disabilities: Secondary | ICD-10-CM | POA: Insufficient documentation

## 2011-01-15 DIAGNOSIS — F329 Major depressive disorder, single episode, unspecified: Secondary | ICD-10-CM | POA: Insufficient documentation

## 2011-01-15 DIAGNOSIS — F840 Autistic disorder: Secondary | ICD-10-CM | POA: Insufficient documentation

## 2011-01-15 DIAGNOSIS — R259 Unspecified abnormal involuntary movements: Secondary | ICD-10-CM | POA: Insufficient documentation

## 2011-01-15 DIAGNOSIS — F3289 Other specified depressive episodes: Secondary | ICD-10-CM | POA: Insufficient documentation

## 2011-01-15 DIAGNOSIS — Z79899 Other long term (current) drug therapy: Secondary | ICD-10-CM | POA: Insufficient documentation

## 2011-01-15 DIAGNOSIS — E1169 Type 2 diabetes mellitus with other specified complication: Secondary | ICD-10-CM | POA: Insufficient documentation

## 2011-01-15 DIAGNOSIS — Z794 Long term (current) use of insulin: Secondary | ICD-10-CM | POA: Insufficient documentation

## 2011-01-15 DIAGNOSIS — R404 Transient alteration of awareness: Secondary | ICD-10-CM | POA: Insufficient documentation

## 2011-01-15 DIAGNOSIS — I10 Essential (primary) hypertension: Secondary | ICD-10-CM | POA: Insufficient documentation

## 2011-01-15 LAB — COMPREHENSIVE METABOLIC PANEL
AST: 35 U/L (ref 0–37)
Albumin: 3.9 g/dL (ref 3.5–5.2)
Alkaline Phosphatase: 83 U/L (ref 39–117)
BUN: 30 mg/dL — ABNORMAL HIGH (ref 6–23)
Chloride: 100 mEq/L (ref 96–112)
GFR calc Af Amer: >60 mL/min (ref >60)
Potassium: 3.9 mEq/L (ref 3.5–5.1)
Sodium: 137 mEq/L (ref 135–145)
Total Protein: 7.6 g/dL (ref 6.0–8.3)

## 2011-01-15 LAB — DIFFERENTIAL
Basophils Absolute: 0.1 10*3/uL (ref 0.0–0.1)
Lymphocytes Relative: 26 % (ref 12–46)
Lymphs Abs: 1.6 10*3/uL (ref 0.7–4.0)
Neutro Abs: 3.9 10*3/uL (ref 1.7–7.7)
Neutrophils Relative %: 63 % (ref 43–77)

## 2011-01-15 LAB — URINE MICROSCOPIC-ADD ON

## 2011-01-15 LAB — POCT CARDIAC MARKERS: Myoglobin, poc: 57.4 ng/mL (ref 12–200)

## 2011-01-15 LAB — CBC
HCT: 32.1 % — ABNORMAL LOW (ref 39.0–52.0)
Hemoglobin: 10.3 g/dL — ABNORMAL LOW (ref 13.0–17.0)
MCV: 82.5 fL (ref 78.0–100.0)
RBC: 3.89 MIL/uL — ABNORMAL LOW (ref 4.22–5.81)
RDW: 14.1 % (ref 11.5–15.5)
WBC: 6.3 10*3/uL (ref 4.0–10.5)

## 2011-01-15 LAB — URINALYSIS, ROUTINE W REFLEX MICROSCOPIC
Glucose, UA: >1000 mg/dL — AB
Protein, ur: NEGATIVE mg/dL
Specific Gravity, Urine: 1.02 (ref 1.005–1.030)
Urobilinogen, UA: 1 mg/dL (ref 0.0–1.0)

## 2011-01-15 LAB — APTT: aPTT: 28 seconds (ref 24–37)

## 2011-01-15 LAB — GLUCOSE, CAPILLARY

## 2011-01-15 LAB — PROTIME-INR: INR: 1.06 (ref 0.00–1.49)

## 2011-01-18 LAB — URINE CULTURE
Colony Count: >=100000
Culture  Setup Time: 201205152110

## 2011-01-20 ENCOUNTER — Emergency Department (HOSPITAL_COMMUNITY)
Admission: EM | Admit: 2011-01-20 | Discharge: 2011-01-20 | Disposition: A | Discharge status: Home / Self Care | Payer: Medicaid Other | Attending: Emergency Medicine | Admitting: Emergency Medicine

## 2011-01-20 ENCOUNTER — Emergency Department (HOSPITAL_COMMUNITY): Payer: Medicaid Other

## 2011-01-20 DIAGNOSIS — E119 Type 2 diabetes mellitus without complications: Secondary | ICD-10-CM | POA: Insufficient documentation

## 2011-01-20 DIAGNOSIS — I10 Essential (primary) hypertension: Secondary | ICD-10-CM | POA: Insufficient documentation

## 2011-01-20 DIAGNOSIS — F840 Autistic disorder: Secondary | ICD-10-CM | POA: Insufficient documentation

## 2011-01-20 DIAGNOSIS — R109 Unspecified abdominal pain: Secondary | ICD-10-CM | POA: Insufficient documentation

## 2011-01-20 DIAGNOSIS — N19 Unspecified kidney failure: Secondary | ICD-10-CM | POA: Insufficient documentation

## 2011-01-20 LAB — URINALYSIS, ROUTINE W REFLEX MICROSCOPIC
Bilirubin Urine: NEGATIVE
Hgb urine dipstick: NEGATIVE
Ketones, ur: 15 mg/dL — AB
Protein, ur: NEGATIVE mg/dL
Urobilinogen, UA: 0.2 mg/dL (ref 0.0–1.0)

## 2011-01-20 LAB — DIFFERENTIAL
Basophils Relative: 1 % (ref 0–1)
Lymphocytes Relative: 14 % (ref 12–46)
Lymphs Abs: 1.1 10*3/uL (ref 0.7–4.0)
Monocytes Relative: 6 % (ref 3–12)
Neutro Abs: 6.1 10*3/uL (ref 1.7–7.7)
Neutrophils Relative %: 78 % — ABNORMAL HIGH (ref 43–77)

## 2011-01-20 LAB — COMPREHENSIVE METABOLIC PANEL
AST: 37 U/L (ref 0–37)
CO2: 22 mEq/L (ref 19–32)
Chloride: 97 mEq/L (ref 96–112)
Creatinine, Ser: 2.28 mg/dL — ABNORMAL HIGH (ref 0.4–1.5)
GFR calc Af Amer: 37 mL/min — ABNORMAL LOW (ref >60)
GFR calc non Af Amer: 31 mL/min — ABNORMAL LOW (ref >60)
Total Bilirubin: 0.5 mg/dL (ref 0.3–1.2)

## 2011-01-20 LAB — CBC
Hemoglobin: 11.6 g/dL — ABNORMAL LOW (ref 13.0–17.0)
MCH: 26.7 pg (ref 26.0–34.0)
MCV: 82 fL (ref 78.0–100.0)
RBC: 4.34 MIL/uL (ref 4.22–5.81)

## 2011-01-20 LAB — GLUCOSE, CAPILLARY: Glucose-Capillary: 358 mg/dL — ABNORMAL HIGH (ref 70–99)

## 2011-03-22 ENCOUNTER — Encounter: Payer: Self-pay | Admitting: Emergency Medicine

## 2011-03-22 ENCOUNTER — Emergency Department (HOSPITAL_COMMUNITY)
Admission: EM | Admit: 2011-03-22 | Discharge: 2011-03-22 | Disposition: A | Discharge status: Other Acute Inpatient Hospital | Payer: Medicaid Other | Attending: Emergency Medicine | Admitting: Emergency Medicine

## 2011-03-22 DIAGNOSIS — R739 Hyperglycemia, unspecified: Secondary | ICD-10-CM

## 2011-03-22 DIAGNOSIS — Z794 Long term (current) use of insulin: Secondary | ICD-10-CM | POA: Insufficient documentation

## 2011-03-22 DIAGNOSIS — E119 Type 2 diabetes mellitus without complications: Secondary | ICD-10-CM | POA: Insufficient documentation

## 2011-03-22 DIAGNOSIS — F840 Autistic disorder: Secondary | ICD-10-CM | POA: Insufficient documentation

## 2011-03-22 DIAGNOSIS — F79 Unspecified intellectual disabilities: Secondary | ICD-10-CM | POA: Insufficient documentation

## 2011-03-22 DIAGNOSIS — N390 Urinary tract infection, site not specified: Secondary | ICD-10-CM

## 2011-03-22 HISTORY — DX: Depression, unspecified: F32.A

## 2011-03-22 HISTORY — DX: Selective mutism: F94.0

## 2011-03-22 HISTORY — DX: Benign prostatic hyperplasia without lower urinary tract symptoms: N40.0

## 2011-03-22 HISTORY — DX: Hypo-osmolality and hyponatremia: E87.1

## 2011-03-22 HISTORY — DX: Major depressive disorder, single episode, unspecified: F32.9

## 2011-03-22 HISTORY — DX: Type 2 diabetes mellitus with hyperosmolarity with coma: E11.01

## 2011-03-22 HISTORY — DX: Autistic disorder: F84.0

## 2011-03-22 HISTORY — DX: Gastroparesis: K31.84

## 2011-03-22 HISTORY — DX: Retention of urine, unspecified: R33.9

## 2011-03-22 HISTORY — DX: Unspecified intellectual disabilities: F79

## 2011-03-22 HISTORY — DX: Hyperglycemia, unspecified: R73.9

## 2011-03-22 HISTORY — DX: Disorder of kidney and ureter, unspecified: N28.9

## 2011-03-22 LAB — CBC
HCT: 31.6 % — ABNORMAL LOW (ref 39.0–52.0)
Hemoglobin: 10.2 g/dL — ABNORMAL LOW (ref 13.0–17.0)
MCH: 26.9 pg (ref 26.0–34.0)
MCHC: 32.3 g/dL (ref 30.0–36.0)
RDW: 13.5 % (ref 11.5–15.5)

## 2011-03-22 LAB — URINALYSIS, ROUTINE W REFLEX MICROSCOPIC
Bilirubin Urine: NEGATIVE
Ketones, ur: >80 mg/dL — AB
Nitrite: POSITIVE — AB
Protein, ur: NEGATIVE mg/dL
Urobilinogen, UA: 0.2 mg/dL (ref 0.0–1.0)

## 2011-03-22 LAB — GLUCOSE, CAPILLARY

## 2011-03-22 LAB — BASIC METABOLIC PANEL
BUN: 47 mg/dL — ABNORMAL HIGH (ref 6–23)
Creatinine, Ser: 1.98 mg/dL — ABNORMAL HIGH (ref 0.50–1.35)
GFR calc Af Amer: 44 mL/min — ABNORMAL LOW (ref >60)
GFR calc non Af Amer: 36 mL/min — ABNORMAL LOW (ref >60)

## 2011-03-22 LAB — DIFFERENTIAL
Basophils Relative: 0 % (ref 0–1)
Eosinophils Absolute: 0.1 10*3/uL (ref 0.0–0.7)
Monocytes Absolute: 0.5 10*3/uL (ref 0.1–1.0)
Monocytes Relative: 5 % (ref 3–12)
Neutrophils Relative %: 83 % — ABNORMAL HIGH (ref 43–77)

## 2011-03-22 MED ORDER — SODIUM CHLORIDE 0.9 % IV SOLN
Freq: Once | INTRAVENOUS | Status: AC
  Administered 2011-03-22: 10:00:00 via INTRAVENOUS

## 2011-03-22 MED ORDER — CEPHALEXIN 500 MG PO CAPS: 500.0000 mg | ORAL_CAPSULE | Freq: Four times a day (QID) | ORAL | Status: AC

## 2011-03-22 MED ORDER — SODIUM CHLORIDE 0.9 % IV SOLN
Freq: Once | INTRAVENOUS | Status: AC
  Administered 2011-03-22: 09:00:00 via INTRAVENOUS

## 2011-03-22 MED ORDER — CEPHALEXIN 500 MG PO CAPS
500.0000 mg | ORAL_CAPSULE | Freq: Once | ORAL | Status: AC
  Administered 2011-03-22: 500 mg via ORAL
  Filled 2011-03-22: qty 1

## 2011-03-22 MED ORDER — SODIUM CHLORIDE 0.9 % IV BOLUS (SEPSIS)
1000.0000 mL | Freq: Once | INTRAVENOUS | Status: AC
  Administered 2011-03-22: 1000 mL via INTRAVENOUS

## 2011-03-22 MED ORDER — INSULIN ASPART 100 UNIT/ML ~~LOC~~ SOLN
20.0000 [IU] | Freq: Once | SUBCUTANEOUS | Status: AC
  Administered 2011-03-22: 20 [IU] via SUBCUTANEOUS

## 2011-03-22 NOTE — ED Provider Notes (Signed)
History     Chief Complaint  Patient presents with  . Hyperglycemia   HPI Comments: Pt brought to ED from NH with report of high blood sugar this AM, unsure of exact measurement. States he did not eat breakfast this AM and also did not take usual 5 units novolog. Pt answers yes to feeling tired but denies pain, change in vision, or nausea.   Patient is a 50 y.o. male presenting with diabetes problem. The history is provided by the patient. The history is limited by the condition of the patient and the absence of a caregiver (mutism/autism).  Diabetes He has type 2 diabetes mellitus. Pertinent negatives for hypoglycemia include no headaches or seizures. (None) Associated symptoms include fatigue and polydipsia. Pertinent negatives for diabetes include no blurred vision, no chest pain and no polyuria. (Unknown) Current diabetic treatment includes insulin injections. He is currently taking insulin pre-breakfast, pre-dinner and pre-lunch.    Past Medical History  Diagnosis Date  . Renal disorder   . Elective mutism   . Autism   . Hyperosmolar (nonketotic) coma   . Hyperglycemia   . Diabetes mellitus   . Hyponatremia   . Mental retardation   . Urinary retention   . BPH (benign prostatic hyperplasia)   . Gastroparesis   . Depression     History reviewed. No pertinent past surgical history.  History reviewed. No pertinent family history.  History  Substance Use Topics  . Smoking status: Never Smoker   . Smokeless tobacco: Not on file  . Alcohol Use: No      Review of Systems  Constitutional: Positive for fatigue.  HENT: Negative for congestion, sinus pressure and ear discharge.   Eyes: Negative for blurred vision and discharge.  Respiratory: Negative for cough.   Cardiovascular: Negative for chest pain.  Gastrointestinal: Negative for nausea, abdominal pain and diarrhea.  Genitourinary: Negative for polyuria, frequency and hematuria.  Musculoskeletal: Negative for back  pain.  Skin: Negative for rash.  Neurological: Negative for seizures and headaches.  Hematological: Positive for polydipsia.  Psychiatric/Behavioral: Negative for hallucinations.  All other systems reviewed and are negative.    Physical Exam  BP 147/94  Pulse 93  Temp(Src) 97.7 F (36.5 C) (Oral)  Resp 24  Ht 6\' 1"  (1.854 m)  Wt 160 lb (72.576 kg)  BMI 21.11 kg/m2  SpO2 92%  Physical Exam  Constitutional: He appears well-developed.  HENT:  Head: Normocephalic and atraumatic.  Mouth/Throat: Mucous membranes are dry.  Eyes: Conjunctivae and EOM are normal. No scleral icterus.  Neck: Neck supple. No thyromegaly present.  Cardiovascular: Normal rate and regular rhythm.  Exam reveals no gallop and no friction rub.   No murmur heard. Pulmonary/Chest: No stridor. He has no wheezes. He has no rales. He exhibits no tenderness.  Abdominal: He exhibits no distension. There is no tenderness. There is no rebound.  Genitourinary:       Foley catheter in place, still draining moderately dark urine after 1 L bolus in ED  Musculoskeletal: Normal range of motion. He exhibits no edema.  Lymphadenopathy:    He has no cervical adenopathy.  Neurological: He is alert.       Awake and alert; does not speak but does answer yes/no questions by shaking his head; follows all commands well; strength normal in all extremities  Skin: No rash noted. No erythema.  Psychiatric: He has a normal mood and affect. His behavior is normal.    ED Course  Procedures  Results for  orders placed during the hospital encounter of 03/22/11  GLUCOSE, CAPILLARY      Component Value Range   Glucose-Capillary >600 (*) 70 - 99 (mg/dL)  CBC      Component Value Range   WBC 9.9  4.0 - 10.5 (K/uL)   RBC 3.79 (*) 4.22 - 5.81 (MIL/uL)   Hemoglobin 10.2 (*) 13.0 - 17.0 (g/dL)   HCT 31.6 (*) 39.0 - 52.0 (%)   MCV 83.4  78.0 - 100.0 (fL)   MCH 26.9  26.0 - 34.0 (pg)   MCHC 32.3  30.0 - 36.0 (g/dL)   RDW 13.5  11.5 -  15.5 (%)   Platelets 191  150 - 400 (K/uL)  DIFFERENTIAL      Component Value Range   Neutrophils Relative 83 (*) 43 - 77 (%)   Neutro Abs 8.3 (*) 1.7 - 7.7 (K/uL)   Lymphocytes Relative 10 (*) 12 - 46 (%)   Lymphs Abs 1.0  0.7 - 4.0 (K/uL)   Monocytes Relative 5  3 - 12 (%)   Monocytes Absolute 0.5  0.1 - 1.0 (K/uL)   Eosinophils Relative 1  0 - 5 (%)   Eosinophils Absolute 0.1  0.0 - 0.7 (K/uL)   Basophils Relative 0  0 - 1 (%)   Basophils Absolute 0.0  0.0 - 0.1 (K/uL)  BASIC METABOLIC PANEL      Component Value Range   Sodium 128 (*) 135 - 145 (mEq/L)   Potassium 5.1  3.5 - 5.1 (mEq/L)   Chloride 90 (*) 96 - 112 (mEq/L)   CO2 14 (*) 19 - 32 (mEq/L)   Glucose, Bld 677 (*) 70 - 99 (mg/dL)   BUN 47 (*) 6 - 23 (mg/dL)   Creatinine, Ser 1.98 (*) 0.50 - 1.35 (mg/dL)   Calcium 9.8  8.4 - 10.5 (mg/dL)   GFR calc non Af Amer 36 (*) >60 (mL/min)   GFR calc Af Amer 44 (*) >60 (mL/min)  URINALYSIS, ROUTINE W REFLEX MICROSCOPIC      Component Value Range   Color, Urine YELLOW  YELLOW    Appearance CLEAR  CLEAR    Specific Gravity, Urine 1.010  1.005 - 1.030    pH 7.0  5.0 - 8.0    Glucose, UA >1000 (*) NEGATIVE (mg/dL)   Hgb urine dipstick NEGATIVE  NEGATIVE    Bilirubin Urine NEGATIVE  NEGATIVE    Ketones, ur >80 (*) NEGATIVE (mg/dL)   Protein, ur NEGATIVE  NEGATIVE (mg/dL)   Urobilinogen, UA 0.2  0.0 - 1.0 (mg/dL)   Nitrite POSITIVE (*) NEGATIVE    Leukocytes, UA TRACE (*) NEGATIVE   GLUCOSE, CAPILLARY      Component Value Range   Glucose-Capillary 526 (*) 70 - 99 (mg/dL)  URINE MICROSCOPIC-ADD ON      Component Value Range   WBC, UA 7-10  <3 (WBC/hpf)   Bacteria, UA MANY (*) RARE    I personally performed the services described in this documentation, which was scribed in my presence. The recorded information has been reviewed and considered.   MDM Results discussed.  Pt improved    Written by Caryl Bis acting as scribe for Maudry Diego, MD.   Maudry Diego, MD 03/22/11 [phone]

## 2011-03-22 NOTE — ED Notes (Signed)
PT to ED from [facility] due to glucometer reading of "high". Pt moaning at times. PT gestures hurting (soreness) all over. NAD at this time. Pt answers questions appropriately by gestures and shaking head.

## 2011-03-22 NOTE — ED Notes (Signed)
Staff from [facility] here to pick pt up, foley is to remain in place, pt assisted to wheelchair, crackers and juice given per his request,

## 2011-03-22 NOTE — ED Notes (Signed)
Pt blood sugar read high on the meter this am. Pt brought in by nursing facility.

## 2011-03-22 NOTE — ED Notes (Signed)
Called [facility], gave report to [employer] (med tech).  They to send someone to pick up pt

## 2011-03-22 NOTE — ED Notes (Signed)
Critical Glucose reading received from Lab. Glucose of 667 reported to Dr Roderic Palau.

## 2011-03-24 ENCOUNTER — Emergency Department (HOSPITAL_COMMUNITY)
Admission: EM | Admit: 2011-03-24 | Discharge: 2011-03-25 | Disposition: A | Discharge status: Skilled Nursing Facility | Payer: Medicaid Other | Attending: Emergency Medicine | Admitting: Emergency Medicine

## 2011-03-24 ENCOUNTER — Emergency Department (HOSPITAL_COMMUNITY): Payer: Medicaid Other

## 2011-03-24 ENCOUNTER — Encounter (HOSPITAL_COMMUNITY): Payer: Self-pay | Admitting: *Deleted

## 2011-03-24 DIAGNOSIS — N390 Urinary tract infection, site not specified: Secondary | ICD-10-CM | POA: Insufficient documentation

## 2011-03-24 DIAGNOSIS — Z79899 Other long term (current) drug therapy: Secondary | ICD-10-CM | POA: Insufficient documentation

## 2011-03-24 DIAGNOSIS — F79 Unspecified intellectual disabilities: Secondary | ICD-10-CM | POA: Insufficient documentation

## 2011-03-24 DIAGNOSIS — F840 Autistic disorder: Secondary | ICD-10-CM | POA: Insufficient documentation

## 2011-03-24 DIAGNOSIS — E119 Type 2 diabetes mellitus without complications: Secondary | ICD-10-CM | POA: Insufficient documentation

## 2011-03-24 DIAGNOSIS — Z794 Long term (current) use of insulin: Secondary | ICD-10-CM | POA: Insufficient documentation

## 2011-03-24 DIAGNOSIS — F3289 Other specified depressive episodes: Secondary | ICD-10-CM | POA: Insufficient documentation

## 2011-03-24 DIAGNOSIS — F329 Major depressive disorder, single episode, unspecified: Secondary | ICD-10-CM | POA: Insufficient documentation

## 2011-03-24 LAB — COMPREHENSIVE METABOLIC PANEL
ALT: 28 U/L (ref 0–53)
AST: 29 U/L (ref 0–37)
Albumin: 3.2 g/dL — ABNORMAL LOW (ref 3.5–5.2)
Alkaline Phosphatase: 76 U/L (ref 39–117)
CO2: 27 mEq/L (ref 19–32)
Chloride: 101 mEq/L (ref 96–112)
Creatinine, Ser: 1.15 mg/dL (ref 0.50–1.35)
GFR calc non Af Amer: >60 mL/min (ref >60)
Potassium: 3.7 mEq/L (ref 3.5–5.1)
Sodium: 135 mEq/L (ref 135–145)
Total Bilirubin: 0.4 mg/dL (ref 0.3–1.2)

## 2011-03-24 LAB — GLUCOSE, CAPILLARY: Glucose-Capillary: 207 mg/dL — ABNORMAL HIGH (ref 70–99)

## 2011-03-24 LAB — URINALYSIS, ROUTINE W REFLEX MICROSCOPIC
Bilirubin Urine: NEGATIVE
Glucose, UA: 500 mg/dL — AB
Ketones, ur: NEGATIVE mg/dL
Protein, ur: NEGATIVE mg/dL
Urobilinogen, UA: 0.2 mg/dL (ref 0.0–1.0)

## 2011-03-24 LAB — URINE MICROSCOPIC-ADD ON

## 2011-03-24 LAB — URINE CULTURE
Colony Count: 90000
Culture  Setup Time: 201207200344

## 2011-03-24 LAB — CBC
MCV: 79.2 fL (ref 78.0–100.0)
Platelets: 238 10*3/uL (ref 150–400)
RBC: 3.95 MIL/uL — ABNORMAL LOW (ref 4.22–5.81)
WBC: 6.2 10*3/uL (ref 4.0–10.5)

## 2011-03-24 MED ORDER — SODIUM CHLORIDE 0.9 % IJ SOLN
INTRAMUSCULAR | Status: AC
  Filled 2011-03-24: qty 10

## 2011-03-24 NOTE — ED Provider Notes (Signed)
History     Chief Complaint  Patient presents with  . Abdominal Pain   Patient is a 50 y.o. male presenting with abdominal pain. The history is provided by the patient and the nursing home. The history is limited by the absence of a caregiver (Pt does not speak. Signals head yes or no).  Abdominal Pain The primary symptoms of the illness include abdominal pain. The primary symptoms of the illness do not include fever, shortness of breath, vomiting or dysuria. The current episode started 3 to 5 hours ago.  Additional symptoms associated with the illness include constipation. Symptoms associated with the illness do not include chills, diaphoresis, urgency, hematuria, frequency or back pain. Significant associated medical issues include diabetes. Significant associated medical issues do not include PUD.    Past Medical History  Diagnosis Date  . Renal disorder   . Elective mutism   . Autism   . Hyperosmolar (nonketotic) coma   . Hyperglycemia   . Diabetes mellitus   . Hyponatremia   . Mental retardation   . Urinary retention   . BPH (benign prostatic hyperplasia)   . Gastroparesis   . Depression     History reviewed. No pertinent past surgical history.  History reviewed. No pertinent family history.  History  Substance Use Topics  . Smoking status: Never Smoker   . Smokeless tobacco: Not on file  . Alcohol Use: No      Review of Systems  Constitutional: Positive for appetite change. Negative for fever, chills, diaphoresis and activity change.       All ROS Neg except as noted in HPI  HENT: Negative for nosebleeds, trouble swallowing and neck pain.   Eyes: Negative for photophobia and discharge.  Respiratory: Negative for cough and shortness of breath.   Cardiovascular: Negative for chest pain and palpitations.  Gastrointestinal: Positive for abdominal pain and constipation. Negative for vomiting.  Genitourinary: Negative for dysuria, urgency, frequency and hematuria.    Musculoskeletal: Negative for back pain and arthralgias.  Skin: Negative.   Neurological: Negative for dizziness, seizures and speech difficulty.  Psychiatric/Behavioral:       Unable to check due to pt not talking, only nodding head.    Physical Exam  BP 188/75  Pulse 67  Temp(Src) 98.3 F (36.8 C) (Oral)  Resp 21  SpO2 100%  Physical Exam  Nursing note and vitals reviewed. Constitutional: He appears well-developed and well-nourished.  HENT:  Head: Normocephalic and atraumatic.  Eyes: Conjunctivae are normal. Pupils are equal, round, and reactive to light.  Neck: Normal range of motion.  Cardiovascular: Normal rate and normal heart sounds.   Pulmonary/Chest: Breath sounds normal. No stridor. No respiratory distress.  Abdominal: Soft. He exhibits no mass. There is no tenderness. There is no rebound and no guarding.  Musculoskeletal: Normal range of motion.  Lymphadenopathy:    He has no cervical adenopathy.  Neurological: He is alert.       Can not assess further. Pt does not speak, only motions head yes and no.  Skin: Skin is warm and dry.    ED Course  Procedures  MDM I have reviewed nursing notes, vital signs, and all appropriate lab and imaging results for this patient.      Lenox Ahr, [place] 03/25/11 [place], [place] 03/25/11 [phone]

## 2011-03-24 NOTE — ED Notes (Signed)
Rolled over on side,feels better.  states pain is 1/10

## 2011-03-24 NOTE — ED Notes (Signed)
Pt brought in by [facility] for c/o abd pain from [facility]; pt states he hasn't had BM x3 days

## 2011-03-24 NOTE — ED Notes (Signed)
Pt brought in by [facility] for c/o abd pain that started today; pt states he has not had a BM x 3 days

## 2011-03-25 LAB — GLUCOSE, CAPILLARY: Glucose-Capillary: 154 mg/dL — ABNORMAL HIGH (ref 70–99)

## 2011-03-25 MED ORDER — DEXTROSE 5 % IV SOLN
INTRAVENOUS | Status: AC
  Filled 2011-03-25: qty 1

## 2011-03-25 MED ORDER — CEFTRIAXONE SODIUM 1 G IJ SOLR
1.0000 g | Freq: Once | INTRAMUSCULAR | Status: AC
  Administered 2011-03-25: 1 g via INTRAVENOUS

## 2011-03-25 NOTE — Progress Notes (Signed)
Urine in foley bag clear. Tolerated IV Rocephin without problem. Pt appears comfortable at discharge.

## 2011-03-25 NOTE — ED Provider Notes (Signed)
History     Chief Complaint  Patient presents with  . Abdominal Pain   Patient is a 50 y.o. male presenting with abdominal pain. The history is provided by the patient and the nursing home. The history is limited by a developmental delay.  Abdominal Pain The primary symptoms of the illness include abdominal pain. The current episode started 6 to 12 hours ago. The onset of the illness was gradual. The problem has not changed since onset. Change in bowel habit: no bm in 3 days. Additional symptoms associated with the illness include constipation.    Past Medical History  Diagnosis Date  . Renal disorder   . Elective mutism   . Autism   . Hyperosmolar (nonketotic) coma   . Hyperglycemia   . Diabetes mellitus   . Hyponatremia   . Mental retardation   . Urinary retention   . BPH (benign prostatic hyperplasia)   . Gastroparesis   . Depression     History reviewed. No pertinent past surgical history.  History reviewed. No pertinent family history.  History  Substance Use Topics  . Smoking status: Never Smoker   . Smokeless tobacco: Not on file  . Alcohol Use: No      Review of Systems  Unable to perform ROS Gastrointestinal: Positive for abdominal pain and constipation.    Physical Exam  BP 145/63  Pulse 70  Temp(Src) 98.3 F (36.8 C) (Oral)  Resp 21  SpO2 100%  Physical Exam  Nursing note and vitals reviewed. Constitutional: He is oriented to person, place, and time. He appears well-developed and well-nourished.  HENT:  Head: Normocephalic and atraumatic.  Eyes: Conjunctivae and EOM are normal. Pupils are equal, round, and reactive to light.  Neck: Normal range of motion. Neck supple.  Cardiovascular: Normal rate and regular rhythm.   Pulmonary/Chest: Effort normal and breath sounds normal.  Abdominal: Soft. Bowel sounds are normal.       Min tenderness;  No acute abd  Musculoskeletal: Normal range of motion.  Neurological: He is alert and oriented to  person, place, and time.  Skin: Skin is warm and dry.  Psychiatric: He has a normal mood and affect.    ED Course  Procedures  MDM No acute abd;  3 way abd neg acute; ua shows infection;  rx rocephin;  Can return to facility   Results for orders placed during the hospital encounter of 03/24/11  GLUCOSE, CAPILLARY      Component Value Range   Glucose-Capillary 207 (*) 70 - 99 (mg/dL)  CBC      Component Value Range   WBC 6.2  4.0 - 10.5 (K/uL)   RBC 3.95 (*) 4.22 - 5.81 (MIL/uL)   Hemoglobin 10.6 (*) 13.0 - 17.0 (g/dL)   HCT 31.3 (*) 39.0 - 52.0 (%)   MCV 79.2  78.0 - 100.0 (fL)   MCH 26.8  26.0 - 34.0 (pg)   MCHC 33.9  30.0 - 36.0 (g/dL)   RDW 13.4  11.5 - 15.5 (%)   Platelets 238  150 - 400 (K/uL)  COMPREHENSIVE METABOLIC PANEL      Component Value Range   Sodium 135  135 - 145 (mEq/L)   Potassium 3.7  3.5 - 5.1 (mEq/L)   Chloride 101  96 - 112 (mEq/L)   CO2 27  19 - 32 (mEq/L)   Glucose, Bld 173 (*) 70 - 99 (mg/dL)   BUN 25 (*) 6 - 23 (mg/dL)   Creatinine, Ser 1.15  0.50 -  1.35 (mg/dL)   Calcium 9.5  8.4 - 10.5 (mg/dL)   Total Protein 7.5  6.0 - 8.3 (g/dL)   Albumin 3.2 (*) 3.5 - 5.2 (g/dL)   AST 29  0 - 37 (U/L)   ALT 28  0 - 53 (U/L)   Alkaline Phosphatase 76  39 - 117 (U/L)   Total Bilirubin 0.4  0.3 - 1.2 (mg/dL)   GFR calc non Af Amer >60  >60 (mL/min)   GFR calc Af Amer >60  >60 (mL/min)  URINALYSIS, ROUTINE W REFLEX MICROSCOPIC      Component Value Range   Color, Urine STRAW (*) YELLOW    Appearance HAZY (*) CLEAR    Specific Gravity, Urine 1.015  1.005 - 1.030    pH 8.0  5.0 - 8.0    Glucose, UA 500 (*) NEGATIVE (mg/dL)   Hgb urine dipstick TRACE (*) NEGATIVE    Bilirubin Urine NEGATIVE  NEGATIVE    Ketones, ur NEGATIVE  NEGATIVE (mg/dL)   Protein, ur NEGATIVE  NEGATIVE (mg/dL)   Urobilinogen, UA 0.2  0.0 - 1.0 (mg/dL)   Nitrite POSITIVE (*) NEGATIVE    Leukocytes, UA NEGATIVE  NEGATIVE   URINE MICROSCOPIC-ADD ON      Component Value Range    Squamous Epithelial / LPF FEW (*) RARE    WBC, UA 11-20  <3 (WBC/hpf)   RBC / HPF 7-10  <3 (RBC/hpf)   Bacteria, UA MANY (*) RARE    Casts GRANULAR CAST (*) NEGATIVE   URINE CULTURE      Component Value Range   Specimen Description URINE, CATHETERIZED     Special Requests CEPHALEXIN     Setup Time [id number]     Colony Count >=100,000 COLONIES/ML     Culture       Value: Multiple bacterial morphotypes present, none predominant. Suggest appropriate recollection if clinically indicated.   Report Status 03/26/2011 FINAL    GLUCOSE, CAPILLARY      Component Value Range   Glucose-Capillary 154 (*) 70 - 99 (mg/dL)   Comment 1 Documented in Chart     Comment 2 Notify RN     Dg Abd Acute W/chest  03/25/2011  *RADIOLOGY REPORT*  Clinical Data: Abdominal pain  ACUTE ABDOMEN SERIES (ABDOMEN 2 VIEW & CHEST 1 VIEW)  Comparison: None.  Findings: Lungs are clear. No pleural effusion or pneumothorax. The cardiomediastinal contours are within normal limits. The visualized bones and soft tissues are without significant appreciable abnormality.  Nonobstructive bowel gas pattern.  No free intraperitoneal air identified.  IMPRESSION: No acute cardiopulmonary process.  Nonobstructive bowel gas pattern.  Original Report Authenticated By: Suanne Marker, M.D.      Nat Christen, MD 04/12/11 [phone]

## 2011-03-26 LAB — URINE CULTURE
Colony Count: >=100000
Culture  Setup Time: 201207222027

## 2011-05-02 NOTE — Progress Notes (Signed)
Encounter addended by: Darrell Jewel on: 05/02/2011 12:50 PM<BR>     Documentation filed: Flowsheet VN

## 2011-05-10 ENCOUNTER — Emergency Department (HOSPITAL_COMMUNITY)
Admission: EM | Admit: 2011-05-10 | Discharge: 2011-05-10 | Disposition: A | Discharge status: Skilled Nursing Facility | Payer: Medicaid Other | Attending: Emergency Medicine | Admitting: Emergency Medicine

## 2011-05-10 ENCOUNTER — Encounter (HOSPITAL_COMMUNITY): Payer: Self-pay

## 2011-05-10 DIAGNOSIS — F79 Unspecified intellectual disabilities: Secondary | ICD-10-CM | POA: Insufficient documentation

## 2011-05-10 DIAGNOSIS — E119 Type 2 diabetes mellitus without complications: Secondary | ICD-10-CM | POA: Insufficient documentation

## 2011-05-10 DIAGNOSIS — Z466 Encounter for fitting and adjustment of urinary device: Secondary | ICD-10-CM | POA: Insufficient documentation

## 2011-05-10 DIAGNOSIS — T839XXA Unspecified complication of genitourinary prosthetic device, implant and graft, initial encounter: Secondary | ICD-10-CM

## 2011-05-10 DIAGNOSIS — F840 Autistic disorder: Secondary | ICD-10-CM | POA: Insufficient documentation

## 2011-05-10 NOTE — ED Notes (Signed)
No bleeding from penis at this time.  Staff says did not bleed at facility either.

## 2011-05-10 NOTE — ED Notes (Signed)
Pt alert and at baseline. D/C instructions reviewed with caretaker. Pt to POV via w/c.

## 2011-05-10 NOTE — ED Notes (Signed)
[facility] I notified of pt d/c and contacted regarding transportation back.

## 2011-05-10 NOTE — ED Provider Notes (Signed)
History     CSN: [id number] Arrival date & time: 05/10/2011  3:09 PM  Chief Complaint  Patient presents with  . Vascular Access Problem   The history is provided by the nursing home.  He has a foley catheter, which was noted to have come out at the nursing home. He does not speak, and it is not known if he pulled the catheter out, and if the balloon was intact. He denies any complaints.  Past Medical History  Diagnosis Date  . Renal disorder   . Elective mutism   . Autism   . Hyperosmolar (nonketotic) coma   . Hyperglycemia   . Diabetes mellitus   . Hyponatremia   . Mental retardation   . Urinary retention   . BPH (benign prostatic hyperplasia)   . Gastroparesis   . Depression     History reviewed. No pertinent past surgical history.  No family history on file.  History  Substance Use Topics  . Smoking status: Never Smoker   . Smokeless tobacco: Not on file  . Alcohol Use: No      Review of Systems  Unable to perform ROS: Psychiatric disorder    Physical Exam  BP 104/54  Pulse 79  Temp(Src) 97.3 F (36.3 C) (Oral)  Resp 24  Ht 5\' 10"  (1.778 m)  Wt 155 lb (70.308 kg)  BMI 22.24 kg/m2  SpO2 100%  Physical Exam  Constitutional: He appears well-developed and well-nourished. No distress.  HENT:  Head: Normocephalic and atraumatic.  Right Ear: External ear normal.  Left Ear: External ear normal.  Mouth/Throat: Oropharynx is clear and moist.  Eyes: Conjunctivae and EOM are normal. Pupils are equal, round, and reactive to light. No scleral icterus.  Neck: Normal range of motion. Neck supple. No JVD present.  Cardiovascular: Normal rate, regular rhythm and normal heart sounds.   No murmur heard. Pulmonary/Chest: Effort normal and breath sounds normal. He has no wheezes. He has no rales. He exhibits no tenderness.  Abdominal: Soft. Bowel sounds are normal. He exhibits no mass. There is no tenderness.  Genitourinary:       No evidence of urethral injury - no  bleeding, new catheter is draining clear urine.  Musculoskeletal: Normal range of motion. He exhibits no edema and no tenderness.  Lymphadenopathy:    He has cervical adenopathy.  Neurological: He is alert. No cranial nerve deficit. Coordination normal.  Skin: Skin is warm and dry.  Psychiatric:       He does not speak, but does follow commands appropriately.    ED Course  Procedures Foley catheter placed by tech. MDM Foley replaced without difficulty.      Delora Fuel, MD [id number]

## 2011-05-10 NOTE — ED Notes (Signed)
Staff at [facility] state pt's foley catheter came out this morning.  Staff denies any bleeding.  Staff unsure if bulb was intact on foley.  Pt denies pain or bleeding.  Pt answering yes and no, staff says pt doesn't speak by choice.

## 2011-07-24 IMAGING — CT CT ABD-PELV W/O CM
2 of 4 series · 17 of 46 positions shown, 19 images · non-contrast
Comparison: MRI of 06/20/2010.  CT of 05/29/2010

CLINICAL DATA: Nonspecific abdominal pain.  Hypertension.
Diabetes.  Mental retardation.

CT ABDOMEN AND PELVIS WITHOUT CONTRAST
TECHNIQUE: Multidetector CT imaging of the abdomen and pelvis was
performed following the standard protocol without intravenous
contrast.

[Series 2: abdomen/pelvis w/o contrast · axial · non-contrast · 0.65mm/px · z∈[-430,-40]mm · 14 of 92 slices shown, 16 images]
[im 7/92  soft-tissue]
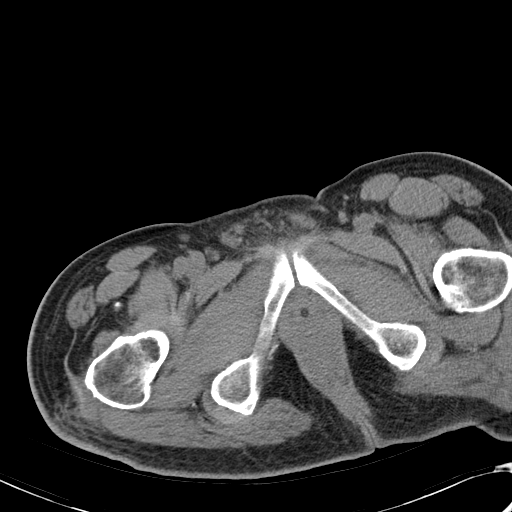
[im 7/92  bone]
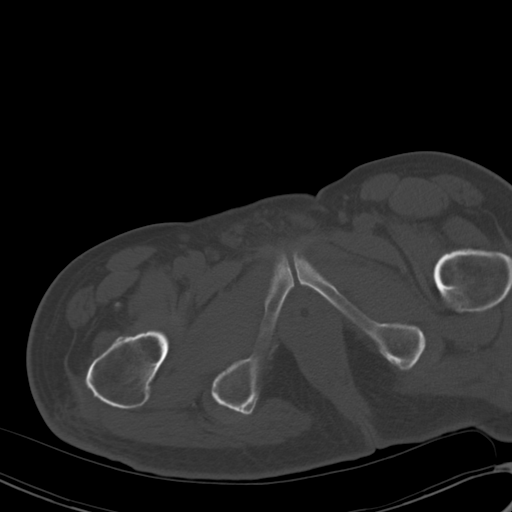
[im 13/92  soft-tissue]
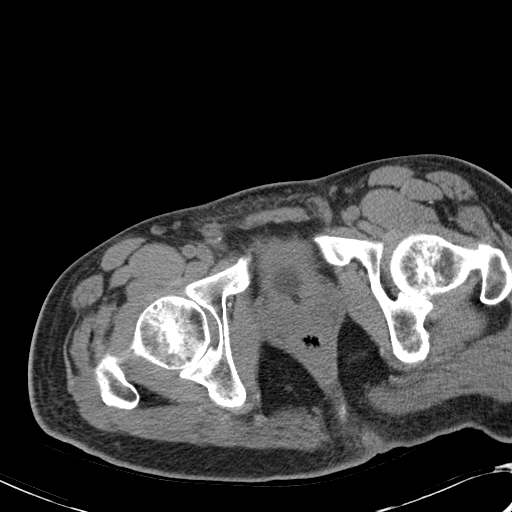
[im 19/92  soft-tissue]
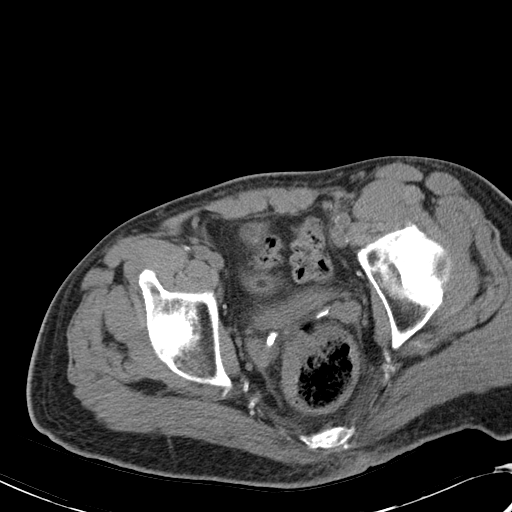
[im 25/92  soft-tissue]
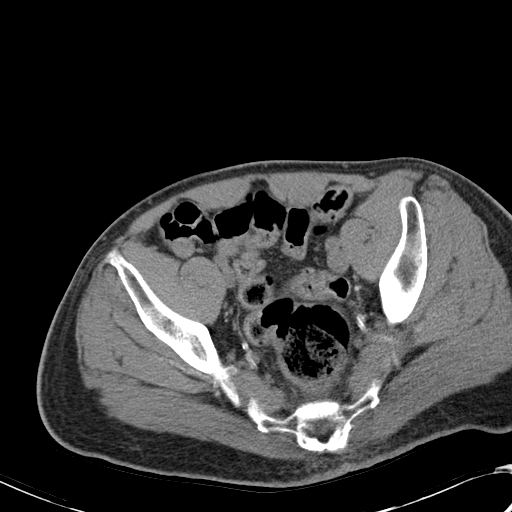
[im 31/92  soft-tissue]
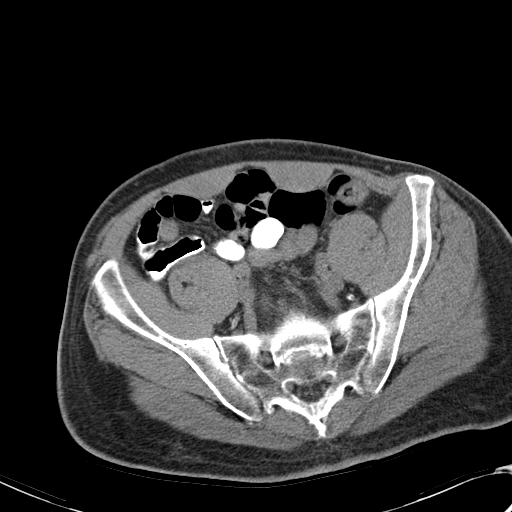
[im 37/92  soft-tissue]
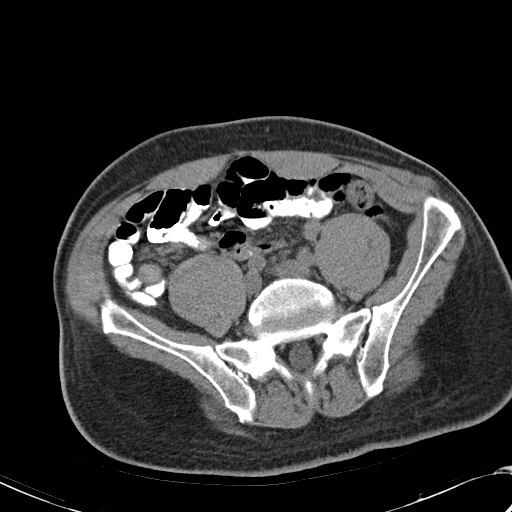
[im 43/92  soft-tissue]
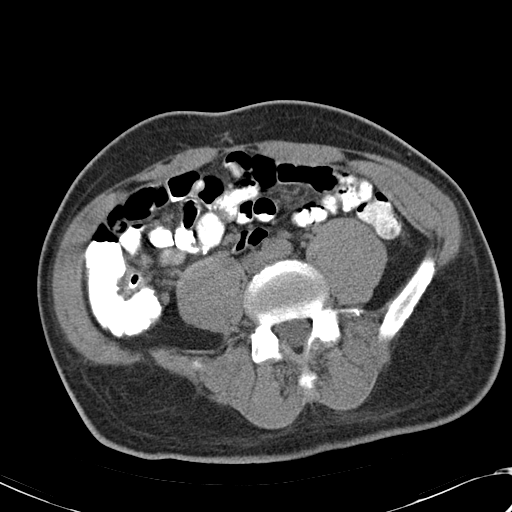
[im 49/92  soft-tissue]
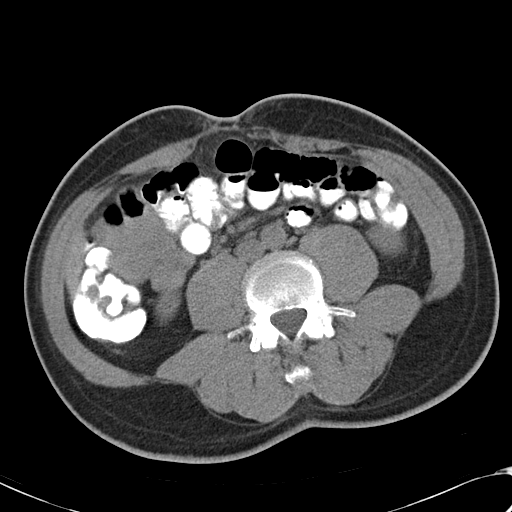
[im 55/92  soft-tissue]
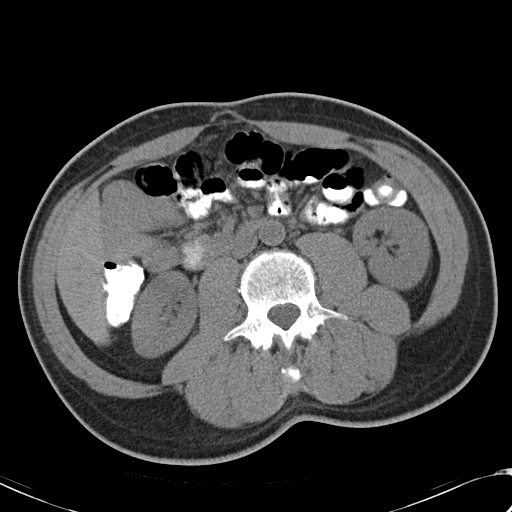
[im 55/92  bone]
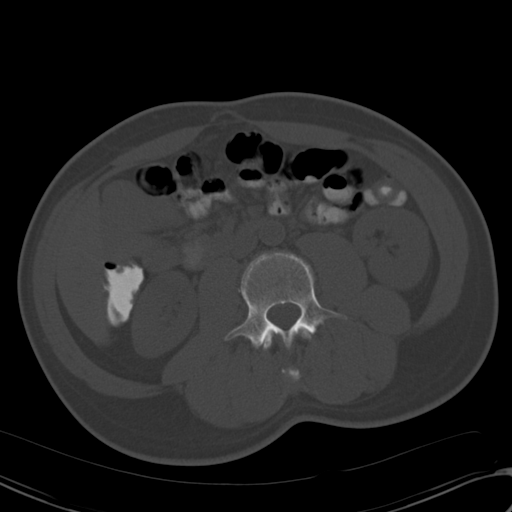
[im 61/92  soft-tissue]
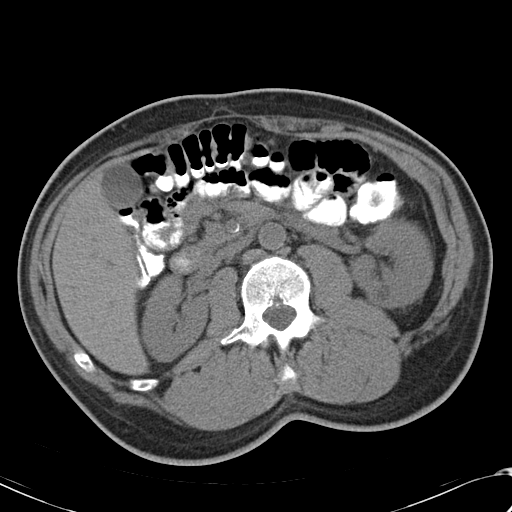
[im 67/92  soft-tissue]
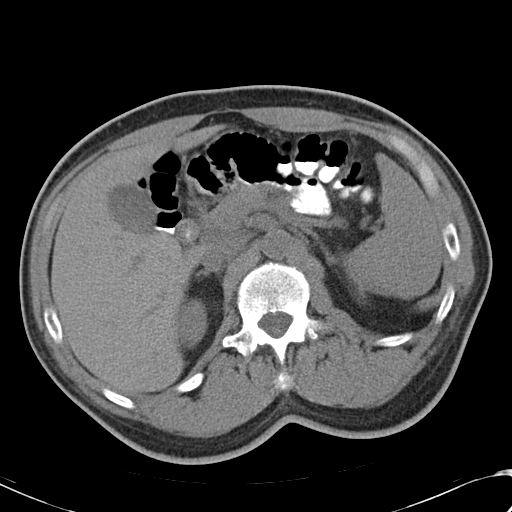
[im 73/92  soft-tissue]
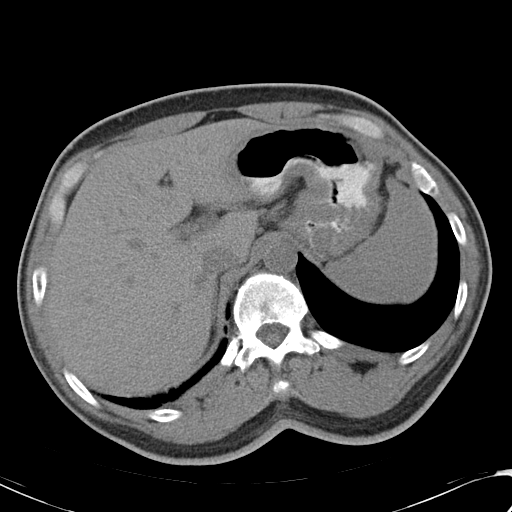
[im 79/92  soft-tissue]
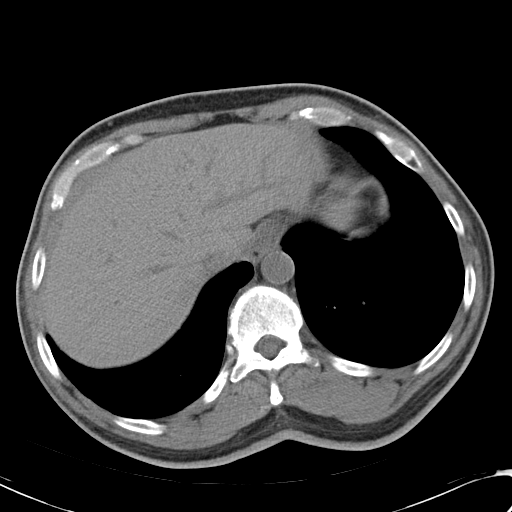
[im 85/92  soft-tissue]
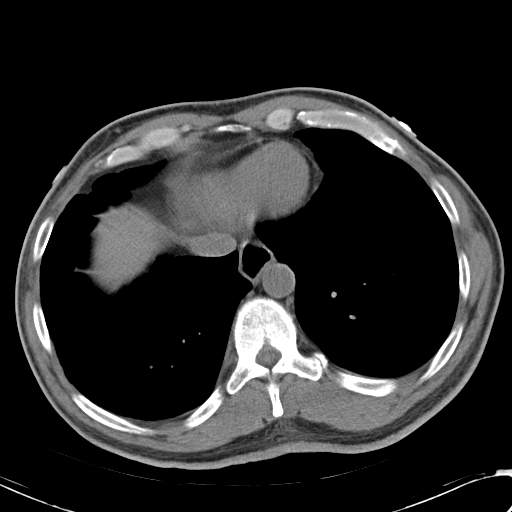

[Series 4: mpr cor (id) · coronal · 0.82mm/px · 3 of 76 slices shown]
[im 26/76  soft-tissue]
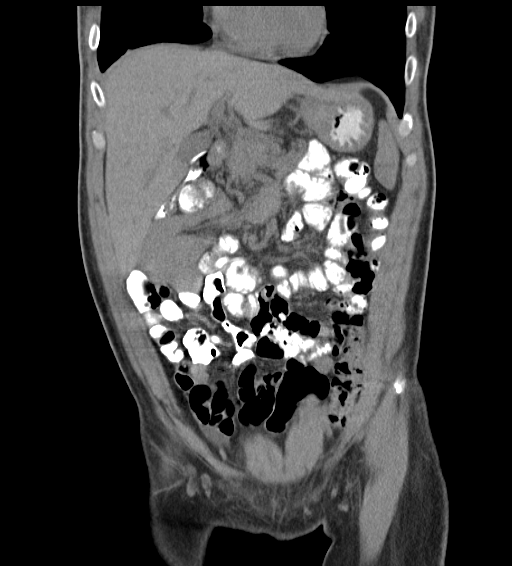
[im 34/76  soft-tissue]
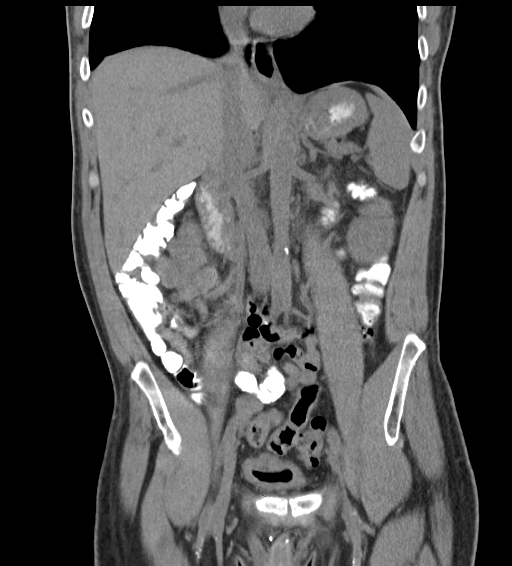
[im 42/76  soft-tissue]
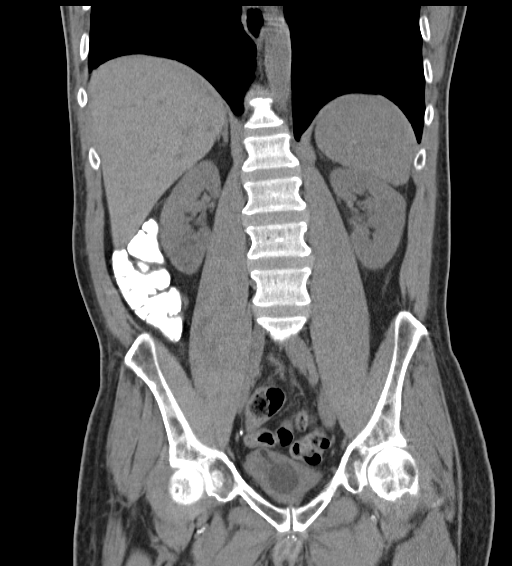

[17 of 46 positions shown; findings below may reference images not displayed]

FINDINGS: Clear lung bases.  Normal heart size without pericardial
or pleural effusion.  Normal uninfused appearance of the liver,
spleen, stomach.  The pancreas is poorly visualized but felt to be
within normal limits in this unenhanced CT.  Normal gallbladder,
biliary tract, adrenal glands, kidneys.  Interpolar sub centimeter
left renal lesion is likely a cyst but is suboptimally evaluated.
The age advanced aortic and branch vessel atherosclerosis. No
retroperitoneal or retrocrural adenopathy.

Moderate rectal stool 5.2 cm.  Normal terminal ileum.  The appendix
is suboptimally evaluated.  Felt to be normal on image 48.  Soft
tissue density more inferiorly on image 57  represents unopacified
small bowel.

Normal small bowel without abdominal ascites.

  No pelvic adenopathy.  Foley catheter within urinary bladder.
Normal prostate without significant free pelvic fluid.  No acute
osseous abnormality.
IMPRESSION: 1. No acute process in the abdomen or pelvis.
2.  Possible fecal impaction.

## 2011-08-30 ENCOUNTER — Encounter (HOSPITAL_COMMUNITY): Payer: Self-pay | Admitting: Emergency Medicine

## 2011-08-30 ENCOUNTER — Emergency Department (HOSPITAL_COMMUNITY)
Admission: EM | Admit: 2011-08-30 | Discharge: 2011-08-31 | Disposition: A | Discharge status: Home / Self Care | Payer: Medicaid Other | Attending: Emergency Medicine | Admitting: Emergency Medicine

## 2011-08-30 DIAGNOSIS — IMO0001 Reserved for inherently not codable concepts without codable children: Secondary | ICD-10-CM | POA: Insufficient documentation

## 2011-08-30 DIAGNOSIS — F940 Selective mutism: Secondary | ICD-10-CM | POA: Insufficient documentation

## 2011-08-30 DIAGNOSIS — F329 Major depressive disorder, single episode, unspecified: Secondary | ICD-10-CM | POA: Insufficient documentation

## 2011-08-30 DIAGNOSIS — F3289 Other specified depressive episodes: Secondary | ICD-10-CM | POA: Insufficient documentation

## 2011-08-30 DIAGNOSIS — R011 Cardiac murmur, unspecified: Secondary | ICD-10-CM | POA: Insufficient documentation

## 2011-08-30 DIAGNOSIS — E1165 Type 2 diabetes mellitus with hyperglycemia: Secondary | ICD-10-CM

## 2011-08-30 DIAGNOSIS — K3184 Gastroparesis: Secondary | ICD-10-CM | POA: Insufficient documentation

## 2011-08-30 DIAGNOSIS — F79 Unspecified intellectual disabilities: Secondary | ICD-10-CM | POA: Insufficient documentation

## 2011-08-30 DIAGNOSIS — N400 Enlarged prostate without lower urinary tract symptoms: Secondary | ICD-10-CM | POA: Insufficient documentation

## 2011-08-30 DIAGNOSIS — Z794 Long term (current) use of insulin: Secondary | ICD-10-CM | POA: Insufficient documentation

## 2011-08-30 LAB — GLUCOSE, CAPILLARY
Glucose-Capillary: 157 mg/dL — ABNORMAL HIGH (ref 70–99)
Glucose-Capillary: 171 mg/dL — ABNORMAL HIGH (ref 70–99)
Glucose-Capillary: 45 mg/dL — ABNORMAL LOW (ref 70–99)

## 2011-08-30 NOTE — ED Provider Notes (Signed)
History     CSN: [id number]  Arrival date & time 08/30/11  2132   None     Chief Complaint  Patient presents with  . Blood Sugar Problem    (Consider location/radiation/quality/duration/timing/severity/associated sxs/prior treatment) HPI Comments: Patient is a resident of time for [place] assisted [facility] center here in the [place] area. The patient was sent to the emergency department because of" fluctuating blood sugars" the nursing staff at the assisted living center report that the patient's blood sugars have been fluctuating for approximately 30 days. They report that during the day today his blood sugar was as low as 32. EMS was called. Upon their arrival the blood sugar was 90 and while in route the blood sugar dropped down to 53. The patient has mild to moderate mental retardation, and mutism and cannot provide adequate history for himself  Level V caveat  The history is provided by the nursing home.    Past Medical History  Diagnosis Date  . Renal disorder   . Elective mutism   . Autism   . Hyperosmolar (nonketotic) coma   . Hyperglycemia   . Diabetes mellitus   . Hyponatremia   . Mental retardation   . Urinary retention   . BPH (benign prostatic hyperplasia)   . Gastroparesis   . Depression     History reviewed. No pertinent past surgical history.  No family history on file.  History  Substance Use Topics  . Smoking status: Never Smoker   . Smokeless tobacco: Not on file  . Alcohol Use: No      Review of Systems  Unable to perform ROS: Other   level V caveat  Allergies  Review of patient's allergies indicates no known allergies.  Home Medications   Current Outpatient Rx  Name Route Sig Dispense Refill  . INSULIN ASPART 100 UNIT/ML [place] SOLN Subcutaneous Inject into the skin 3 (three) times daily before meals. Per sliding scale instructions: If 150-200= 1 unit, 201-250= 2 units, 251-300= 3 units, 301-350= 4 units, 351-400= 5 units, Greater than 400=  6 units    . INSULIN ASPART 100 UNIT/ML [place] SOLN Subcutaneous Inject 10 Units into the skin 3 (three) times daily before meals. In addition to sliding scale instructions. **Not to be administered for blood sugars below 70    . INSULIN GLARGINE 100 UNIT/ML [place] SOLN Subcutaneous Inject 30 Units into the skin at bedtime.      Marland Kitchen LISINOPRIL 10 MG PO TABS Oral Take 10 mg by mouth daily.      Marland Kitchen NITROFURANTOIN MONOHYD MACRO 100 MG PO CAPS Oral Take 100 mg by mouth daily at 8 pm.      . RANITIDINE HCL 150 MG PO CAPS Oral Take 150 mg by mouth 2 (two) times daily.      . SERTRALINE HCL 100 MG PO TABS Oral Take 100 mg by mouth daily.      . SERTRALINE HCL 50 MG PO TABS Oral Take 150 mg by mouth daily.     . SULFAMETHOXAZOLE-TMP DS 800-160 MG PO TABS Oral Take 1 tablet by mouth daily.      Marland Kitchen TAMSULOSIN HCL 0.4 MG PO CAPS Oral Take 0.4 mg by mouth daily.      . TRAZODONE HCL 50 MG PO TABS Oral Take 50 mg by mouth at bedtime.      Marland Kitchen ZIPRASIDONE HCL 40 MG PO CAPS Oral Take 40 mg by mouth 2 (two) times daily with a meal.  BP 150/90  Pulse 91  Temp(Src) 97.9 F (36.6 C) (Oral)  Resp 20  SpO2 100%  Physical Exam  Nursing note and vitals reviewed. Constitutional: He is oriented to person, place, and time. He appears well-developed and well-nourished.  Non-toxic appearance. No distress.  HENT:  Head: Normocephalic.  Right Ear: Tympanic membrane and external ear normal.  Left Ear: Tympanic membrane and external ear normal.       Patient does not speak. Mouth and throat clear.  Eyes: EOM and lids are normal. Pupils are equal, round, and reactive to light.  Neck: Normal range of motion. Neck supple. Carotid bruit is not present.  Cardiovascular: Normal rate, regular rhythm, intact distal pulses and normal pulses.   Murmur heard. Pulmonary/Chest: Breath sounds normal. No respiratory distress. He has no wheezes. He exhibits no tenderness.  Abdominal: Soft. Bowel sounds are normal. There is no tenderness.  There is no guarding.  Musculoskeletal: Normal range of motion.  Lymphadenopathy:       Head (right side): No submandibular adenopathy present.       Head (left side): No submandibular adenopathy present.    He has no cervical adenopathy.  Neurological: He is alert and oriented to person, place, and time. He has normal strength. No cranial nerve deficit or sensory deficit.  Skin: Skin is warm and dry.  Psychiatric: He has a normal mood and affect. His speech is normal.    ED Course  Procedures (including critical care time)  Labs Reviewed  GLUCOSE, CAPILLARY - Abnormal; Notable for the following:    Glucose-Capillary 45 (*)    All other components within normal limits  GLUCOSE, CAPILLARY - Abnormal; Notable for the following:    Glucose-Capillary 157 (*)    All other components within normal limits  GLUCOSE, CAPILLARY - Abnormal; Notable for the following:    Glucose-Capillary 171 (*)    All other components within normal limits   No results found.  Dx: Uncontrolled insulin-dependent diabetes mellitus   MDM  I have reviewed nursing notes, vital signs, and all appropriate lab and imaging results for this patient.  There has been no change in patient's mentation since his admission to the emergency department. His blood sugars have been followed closely. His electrolytes have been assessed. Old records were reviewed and patient has been having fluctuations in his blood sugar or over a year. Patient drinking orange juice in the emergency department without problem. It is safe for patient to be discharged back to the time for [place] assisted [facility]. Nursing staff advised to have patient return if any changes, problems, or concerns. Nursing staff to have primary physician evaluate medications for changes and adjustments.      Lenox Ahr, [place] 08/31/11 [phone]

## 2011-08-30 NOTE — ED Notes (Signed)
Patient presents to ER via EMS from [facility] for concerns with fluctuating blood sugar.  According to papers sent from facility, patient has fluctuating blood sugars x 30 days.  Per facility, blood sugar was 32, upon EMS arrival at facility, blood sugar was 90 and en route it was 53.  Patient has mild MR.

## 2011-08-31 LAB — URINE MICROSCOPIC-ADD ON

## 2011-08-31 LAB — BASIC METABOLIC PANEL
Calcium: 9.9 mg/dL (ref 8.4–10.5)
GFR calc non Af Amer: 42 mL/min — ABNORMAL LOW (ref >90)
Glucose, Bld: 183 mg/dL — ABNORMAL HIGH (ref 70–99)
Potassium: 5 mEq/L (ref 3.5–5.1)
Sodium: 133 mEq/L — ABNORMAL LOW (ref 135–145)

## 2011-08-31 LAB — CBC
Hemoglobin: 10.5 g/dL — ABNORMAL LOW (ref 13.0–17.0)
MCH: 27.1 pg (ref 26.0–34.0)
MCHC: 32.8 g/dL (ref 30.0–36.0)
Platelets: 176 10*3/uL (ref 150–400)
RBC: 3.88 MIL/uL — ABNORMAL LOW (ref 4.22–5.81)

## 2011-08-31 LAB — URINALYSIS, ROUTINE W REFLEX MICROSCOPIC
Nitrite: NEGATIVE
Protein, ur: NEGATIVE mg/dL
Specific Gravity, Urine: <1.005 — ABNORMAL LOW (ref 1.005–1.030)
Urobilinogen, UA: 0.2 mg/dL (ref 0.0–1.0)

## 2011-08-31 NOTE — ED Provider Notes (Signed)
Medical screening examination/treatment/procedure(s) were performed by non-physician practitioner and as supervising physician I was immediately available for consultation/collaboration.   Wynetta Fines, MD 08/31/11 [phone]

## 2011-08-31 NOTE — ED Notes (Signed)
Spoke to [employer]" at [facility] and advised her that pt was being discharged and needs transportation. Advised by her that she would be sending someone to get pt.

## 2011-08-31 NOTE — ED Notes (Signed)
Pt resting calm and quiet on stretcher with eyes closed and appears to be asleep. Rise and fall of chest present.

## 2011-08-31 NOTE — ED Notes (Signed)
Checked blood sugar 171 as per Levada Dy

## 2011-09-18 ENCOUNTER — Inpatient Hospital Stay (HOSPITAL_COMMUNITY)
Admission: EM | Admit: 2011-09-18 | Discharge: 2011-09-24 | DRG: 871 | Disposition: A | Discharge status: Nursing Home (Includes ICF) | Payer: Medicaid Other | Attending: Internal Medicine | Admitting: Internal Medicine

## 2011-09-18 ENCOUNTER — Encounter (HOSPITAL_COMMUNITY): Payer: Self-pay | Admitting: Emergency Medicine

## 2011-09-18 ENCOUNTER — Emergency Department (HOSPITAL_COMMUNITY): Payer: Medicaid Other

## 2011-09-18 DIAGNOSIS — A419 Sepsis, unspecified organism: Principal | ICD-10-CM | POA: Diagnosis present

## 2011-09-18 DIAGNOSIS — J690 Pneumonitis due to inhalation of food and vomit: Secondary | ICD-10-CM | POA: Diagnosis present

## 2011-09-18 DIAGNOSIS — E119 Type 2 diabetes mellitus without complications: Secondary | ICD-10-CM | POA: Diagnosis present

## 2011-09-18 DIAGNOSIS — E1065 Type 1 diabetes mellitus with hyperglycemia: Secondary | ICD-10-CM | POA: Diagnosis present

## 2011-09-18 DIAGNOSIS — J189 Pneumonia, unspecified organism: Secondary | ICD-10-CM | POA: Diagnosis present

## 2011-09-18 DIAGNOSIS — N179 Acute kidney failure, unspecified: Secondary | ICD-10-CM | POA: Diagnosis present

## 2011-09-18 DIAGNOSIS — A498 Other bacterial infections of unspecified site: Secondary | ICD-10-CM | POA: Diagnosis present

## 2011-09-18 DIAGNOSIS — N400 Enlarged prostate without lower urinary tract symptoms: Secondary | ICD-10-CM | POA: Diagnosis present

## 2011-09-18 DIAGNOSIS — F79 Unspecified intellectual disabilities: Secondary | ICD-10-CM | POA: Diagnosis present

## 2011-09-18 DIAGNOSIS — Z794 Long term (current) use of insulin: Secondary | ICD-10-CM

## 2011-09-18 DIAGNOSIS — N390 Urinary tract infection, site not specified: Secondary | ICD-10-CM | POA: Diagnosis present

## 2011-09-18 DIAGNOSIS — IMO0002 Reserved for concepts with insufficient information to code with codable children: Secondary | ICD-10-CM | POA: Diagnosis present

## 2011-09-18 LAB — CBC
HCT: 28.8 % — ABNORMAL LOW (ref 39.0–52.0)
Hemoglobin: 9.7 g/dL — ABNORMAL LOW (ref 13.0–17.0)
MCH: 27.5 pg (ref 26.0–34.0)
MCHC: 33.7 g/dL (ref 30.0–36.0)
MCV: 81.6 fL (ref 78.0–100.0)
RDW: 13.6 % (ref 11.5–15.5)

## 2011-09-18 LAB — CULTURE, BLOOD (ROUTINE X 2): Culture: NO GROWTH

## 2011-09-18 LAB — DIFFERENTIAL
Basophils Relative: 0 % (ref 0–1)
Eosinophils Absolute: 0 10*3/uL (ref 0.0–0.7)
Eosinophils Relative: 0 % (ref 0–5)
Monocytes Absolute: 0.3 10*3/uL (ref 0.1–1.0)
Monocytes Relative: 3 % (ref 3–12)
Neutro Abs: 10.6 10*3/uL — ABNORMAL HIGH (ref 1.7–7.7)

## 2011-09-18 LAB — URINE CULTURE: Colony Count: >=100000

## 2011-09-18 LAB — URINALYSIS, ROUTINE W REFLEX MICROSCOPIC
Bilirubin Urine: NEGATIVE
Ketones, ur: NEGATIVE mg/dL
Specific Gravity, Urine: 1.025 (ref 1.005–1.030)
Urobilinogen, UA: 1 mg/dL (ref 0.0–1.0)

## 2011-09-18 LAB — BASIC METABOLIC PANEL
BUN: 40 mg/dL — ABNORMAL HIGH (ref 6–23)
Calcium: 9.5 mg/dL (ref 8.4–10.5)
GFR calc non Af Amer: 41 mL/min — ABNORMAL LOW (ref >90)
Glucose, Bld: 321 mg/dL — ABNORMAL HIGH (ref 70–99)
Sodium: 131 mEq/L — ABNORMAL LOW (ref 135–145)

## 2011-09-18 LAB — URINE MICROSCOPIC-ADD ON

## 2011-09-18 MED ORDER — PIPERACILLIN-TAZOBACTAM 3.375 G IVPB
3.3750 g | Freq: Once | INTRAVENOUS | Status: AC
  Administered 2011-09-18: 3.375 g via INTRAVENOUS
  Filled 2011-09-18: qty 50

## 2011-09-18 MED ORDER — SODIUM CHLORIDE 0.9 % IV BOLUS (SEPSIS)
500.0000 mL | Freq: Once | INTRAVENOUS | Status: AC
  Administered 2011-09-18: 1000 mL via INTRAVENOUS

## 2011-09-18 MED ORDER — ACETAMINOPHEN 500 MG PO TABS
1000.0000 mg | ORAL_TABLET | Freq: Once | ORAL | Status: AC
  Administered 2011-09-18: 1000 mg via ORAL
  Filled 2011-09-18: qty 2

## 2011-09-18 MED ORDER — SODIUM CHLORIDE 0.9 % IV SOLN: INTRAVENOUS | Status: DC

## 2011-09-18 MED ORDER — VANCOMYCIN HCL IN DEXTROSE 1-5 GM/200ML-% IV SOLN
1000.0000 mg | Freq: Once | INTRAVENOUS | Status: AC
  Administered 2011-09-19: 1000 mg via INTRAVENOUS
  Filled 2011-09-18: qty 200

## 2011-09-18 NOTE — ED Notes (Signed)
Patient presents to ER via EMS from [facility] with c/o high blood sugar.  Staff members at [facility] states blood sugar was 293; EMS states blood sugar was 404 en route.  CBG completed on arrival with result of 332.

## 2011-09-18 NOTE — ED Notes (Signed)
Patients recheck temp. Reported to RN

## 2011-09-18 NOTE — ED Provider Notes (Signed)
History    This chart was scribed for [employer]. Alvino Chapel, MD, MD by Rhae Lerner. The patient was seen in room APA18 and the patient's care was started at 10:24PM.   CSN: [id number]  Arrival date & time 09/18/11  2202   First MD Initiated Contact with Patient 09/18/11 2206      Chief Complaint  Patient presents with  . Hyperglycemia   level V caveat due to 2 mental retardation and elective mutism  (Consider location/radiation/quality/duration/timing/severity/associated sxs/prior treatment) The history is provided by the EMS personnel.   [name] is a 51 y.o. male who presents to the Emergency Department BIB EMS complaining of hyperglycemia onset today. Pt has history of mental retardation and autism. EMS reports pt's glucose level was 404 en route to ED. CBG upon arrival was 332.   Past Medical History  Diagnosis Date  . Renal disorder   . Elective mutism   . Autism   . Hyperosmolar (nonketotic) coma   . Hyperglycemia   . Diabetes mellitus   . Hyponatremia   . Mental retardation   . Urinary retention   . BPH (benign prostatic hyperplasia)   . Gastroparesis   . Depression     History reviewed. No pertinent past surgical history.  No family history on file.  History  Substance Use Topics  . Smoking status: Never Smoker   . Smokeless tobacco: Not on file  . Alcohol Use: No      Review of Systems  Unable to perform ROS: Mental status change     Allergies  Review of patient's allergies indicates no known allergies.  Home Medications   Current Outpatient Rx  Name Route Sig Dispense Refill  . INSULIN ASPART 100 UNIT/ML [place] SOLN Subcutaneous Inject 15 Units into the skin 3 (three) times daily before meals. Per sliding scale instructions: If 150-200= 1 unit, 201-250= 2 units, 251-300= 3 units, 301-350= 4 units, 351-400= 5 units, Greater than 400= 6 units    . INSULIN GLARGINE 100 UNIT/ML [place] SOLN Subcutaneous Inject 40 Units into the skin at bedtime.       Marland Kitchen LISINOPRIL 10 MG PO TABS Oral Take 10 mg by mouth every morning.     Marland Kitchen RANITIDINE HCL 150 MG PO CAPS Oral Take 150 mg by mouth 2 (two) times daily.      . SERTRALINE HCL 50 MG PO TABS Oral Take 150 mg by mouth every morning.     . SULFAMETHOXAZOLE-TMP DS 800-160 MG PO TABS Oral Take 1 tablet by mouth every evening.     Marland Kitchen TAMSULOSIN HCL 0.4 MG PO CAPS Oral Take 0.4 mg by mouth every morning.     . TRAZODONE HCL 50 MG PO TABS Oral Take 50 mg by mouth at bedtime.      Marland Kitchen ZIPRASIDONE HCL 40 MG PO CAPS Oral Take 40 mg by mouth 2 (two) times daily with a meal.        BP 123/71  Pulse 109  Temp(Src) 104.6 F (40.3 C) (Rectal)  Resp 22  SpO2 100%  Physical Exam  Nursing note and vitals reviewed. Constitutional: He is oriented to person, place, and time. He appears well-developed and well-nourished. No distress.  HENT:  Head: Normocephalic and atraumatic.  Eyes: EOM are normal. Pupils are equal, round, and reactive to light.  Neck: Neck supple. No thyromegaly present.  Cardiovascular: Normal rate, regular rhythm and normal heart sounds.   Pulmonary/Chest: Effort normal and breath sounds normal. No respiratory distress.  Abdominal: He exhibits no distension. There is no tenderness.       Surgical scar on abdomen Catheter present   Musculoskeletal: Normal range of motion. He exhibits no tenderness.  Neurological: He is alert and oriented to person, place, and time.  Skin: Skin is warm and dry.  Psychiatric: He has a normal mood and affect.    ED Course  Procedures (including critical care time)  DIAGNOSTIC STUDIES: Oxygen Saturation is 100% on room air, normal by my interpretation.    COORDINATION OF CARE:  22:30 EDP ordered medication: 0.9% sodium chloride bolus and 0.9% sodium chloride infusion    22:45 EDP ordered medication: Tylenol 1000mg  tablet  Labs Reviewed  CBC - Abnormal; Notable for the following:    WBC 11.3 (*)    RBC 3.53 (*)    Hemoglobin 9.7 (*)    HCT 28.8  (*)    All other components within normal limits  DIFFERENTIAL - Abnormal; Notable for the following:    Neutrophils Relative 94 (*)    Neutro Abs 10.6 (*)    Lymphocytes Relative 3 (*)    Lymphs Abs 0.4 (*)    All other components within normal limits  URINALYSIS, ROUTINE W REFLEX MICROSCOPIC - Abnormal; Notable for the following:    APPearance HAZY (*)    Glucose, UA 500 (*)    Hgb urine dipstick TRACE (*)    Protein, ur 30 (*)    Leukocytes, UA MODERATE (*)    All other components within normal limits  BASIC METABOLIC PANEL - Abnormal; Notable for the following:    Sodium 131 (*)    Potassium 5.5 (*)    Glucose, Bld 321 (*)    BUN 40 (*)    Creatinine, Ser 1.87 (*)    GFR calc non Af Amer 41 (*)    GFR calc Af Amer 47 (*)    All other components within normal limits  URINE MICROSCOPIC-ADD ON - Abnormal; Notable for the following:    Squamous Epithelial / LPF FEW (*)    Bacteria, UA MANY (*)    Crystals HIPPURIC ACID CRYSTALS (*)    All other components within normal limits  CULTURE, BLOOD (ROUTINE X 2)  CULTURE, BLOOD (ROUTINE X 2)  URINE CULTURE  LACTIC ACID, PLASMA   Dg Chest Port 1 View  09/18/2011  *RADIOLOGY REPORT*  Clinical Data: Fever, hyperglycemia  PORTABLE CHEST - 1 VIEW  Comparison: 01/20/2011  Findings: Normal heart size and vascularity.  Slight increased left hilar opacity compared to the prior studies, suspicious for atelectasis versus early left perihilar pneumonia.  Trachea midline.  Right lung remains clear.  Stable blunting of the right costophrenic angle.  No pneumothorax.  IMPRESSION: Mild left perihilar patchy airspace opacity, suspicious for atelectasis or developing pneumonia.  Recommend radiographic follow- up to document complete resolution.  Original Report Authenticated By: Jerilynn Mages. Daryll Brod, M.D.     1. Urosepsis   2. HCAP (healthcare-associated pneumonia)       MDM  Patient sent in for hyperglycemia. Found to be febrile to 104. He appears  to have a UTI and pneumonia. He be treated with Zosyn and Vanc. He'll be admitted to the hospital       I personally performed the services described in this documentation, which was scribed in my presence. The recorded information has been reviewed and considered.    Jasper Riling. Alvino Chapel, MD 09/18/11 2355

## 2011-09-18 NOTE — ED Notes (Signed)
Arrived via EMS - skin hot and dry.  Visible chills noted.  Indwelling Foley catheter- yellow cloudy drainage with discolored drain tubing.  CBG checked on arrival(332).    EMS reports per nursing home patient was treated earlier today for a low glucose - EMS called to scene and one amp of dextrose given and call cancelled.    This pm around 8:00 patient vomited x 1 what staff at nsg home describe as phlegm.

## 2011-09-19 ENCOUNTER — Encounter (HOSPITAL_COMMUNITY): Payer: Self-pay | Admitting: *Deleted

## 2011-09-19 ENCOUNTER — Emergency Department (HOSPITAL_COMMUNITY): Payer: Medicaid Other

## 2011-09-19 ENCOUNTER — Inpatient Hospital Stay (HOSPITAL_COMMUNITY): Payer: Medicaid Other

## 2011-09-19 DIAGNOSIS — N179 Acute kidney failure, unspecified: Secondary | ICD-10-CM | POA: Diagnosis present

## 2011-09-19 DIAGNOSIS — N390 Urinary tract infection, site not specified: Secondary | ICD-10-CM | POA: Diagnosis present

## 2011-09-19 DIAGNOSIS — J690 Pneumonitis due to inhalation of food and vomit: Secondary | ICD-10-CM | POA: Diagnosis present

## 2011-09-19 DIAGNOSIS — F79 Unspecified intellectual disabilities: Secondary | ICD-10-CM | POA: Diagnosis present

## 2011-09-19 DIAGNOSIS — N400 Enlarged prostate without lower urinary tract symptoms: Secondary | ICD-10-CM | POA: Diagnosis present

## 2011-09-19 DIAGNOSIS — E119 Type 2 diabetes mellitus without complications: Secondary | ICD-10-CM | POA: Diagnosis present

## 2011-09-19 DIAGNOSIS — A419 Sepsis, unspecified organism: Secondary | ICD-10-CM | POA: Diagnosis present

## 2011-09-19 LAB — RETICULOCYTES
RBC.: 3.56 MIL/uL — ABNORMAL LOW (ref 4.22–5.81)
Retic Count, Absolute: 39.2 10*3/uL (ref 19.0–186.0)
Retic Ct Pct: 1.1 % (ref 0.4–3.1)

## 2011-09-19 LAB — GLUCOSE, CAPILLARY
Glucose-Capillary: 332 mg/dL — ABNORMAL HIGH (ref 70–99)
Glucose-Capillary: 218 mg/dL — ABNORMAL HIGH (ref 70–99)
Glucose-Capillary: 310 mg/dL — ABNORMAL HIGH (ref 70–99)

## 2011-09-19 LAB — VITAMIN B12: Vitamin B-12: 253 pg/mL (ref 211–911)

## 2011-09-19 LAB — LEGIONELLA ANTIGEN, URINE

## 2011-09-19 LAB — PROCALCITONIN: Procalcitonin: 0.61 ng/mL

## 2011-09-19 LAB — HEPATIC FUNCTION PANEL
AST: 32 U/L (ref 0–37)
Alkaline Phosphatase: 84 U/L (ref 39–117)
Bilirubin, Direct: 0.2 mg/dL (ref 0.0–0.3)
Total Bilirubin: 0.7 mg/dL (ref 0.3–1.2)

## 2011-09-19 LAB — IRON AND TIBC
Saturation Ratios: 6 % — ABNORMAL LOW (ref 20–55)
UIBC: 266 ug/dL (ref 125–400)

## 2011-09-19 LAB — STREP PNEUMONIAE URINARY ANTIGEN: Strep Pneumo Urinary Antigen: NEGATIVE

## 2011-09-19 LAB — FOLATE: Folate: 6.1 ng/mL

## 2011-09-19 LAB — HEMOGLOBIN A1C: Hgb A1c MFr Bld: 8.3 % — ABNORMAL HIGH (ref <5.7)

## 2011-09-19 LAB — INFLUENZA PANEL BY PCR (TYPE A & B): H1N1 flu by pcr: NOT DETECTED

## 2011-09-19 MED ORDER — ONDANSETRON HCL 4 MG/2ML IJ SOLN: 4.0000 mg | Freq: Four times a day (QID) | INTRAMUSCULAR | Status: DC | PRN

## 2011-09-19 MED ORDER — FLEET ENEMA 7-19 GM/118ML RE ENEM: 1.0000 | ENEMA | Freq: Once | RECTAL | Status: AC | PRN

## 2011-09-19 MED ORDER — INSULIN ASPART 100 UNIT/ML ~~LOC~~ SOLN
0.0000 [IU] | Freq: Every day | SUBCUTANEOUS | Status: DC
  Administered 2011-09-19: 3 [IU] via SUBCUTANEOUS
  Administered 2011-09-20: 2 [IU] via SUBCUTANEOUS

## 2011-09-19 MED ORDER — INSULIN GLARGINE 100 UNIT/ML ~~LOC~~ SOLN
40.0000 [IU] | Freq: Every day | SUBCUTANEOUS | Status: DC
  Administered 2011-09-19: 40 [IU] via SUBCUTANEOUS

## 2011-09-19 MED ORDER — DEXTROSE 5 % IV SOLN
1.0000 g | Freq: Three times a day (TID) | INTRAVENOUS | Status: DC
  Administered 2011-09-19 – 2011-09-24 (×16): 1 g via INTRAVENOUS
  Filled 2011-09-19 (×21): qty 1

## 2011-09-19 MED ORDER — ACETAMINOPHEN 325 MG PO TABS: 650.0000 mg | ORAL_TABLET | ORAL | Status: DC | PRN

## 2011-09-19 MED ORDER — TAMSULOSIN HCL 0.4 MG PO CAPS
0.4000 mg | ORAL_CAPSULE | ORAL | Status: DC
  Administered 2011-09-19 – 2011-09-24 (×6): 0.4 mg via ORAL
  Filled 2011-09-19 (×6): qty 1

## 2011-09-19 MED ORDER — SODIUM CHLORIDE 0.9 % IV SOLN
Freq: Once | INTRAVENOUS | Status: AC
  Administered 2011-09-19: 02:00:00 via INTRAVENOUS

## 2011-09-19 MED ORDER — PRO-STAT SUGAR FREE PO LIQD
30.0000 mL | Freq: Three times a day (TID) | ORAL | Status: DC
  Administered 2011-09-19 – 2011-09-24 (×13): 30 mL via ORAL
  Filled 2011-09-19 (×13): qty 30

## 2011-09-19 MED ORDER — ZIPRASIDONE HCL 40 MG PO CAPS
40.0000 mg | ORAL_CAPSULE | Freq: Two times a day (BID) | ORAL | Status: DC
  Administered 2011-09-19 – 2011-09-24 (×11): 40 mg via ORAL
  Filled 2011-09-19 (×16): qty 1

## 2011-09-19 MED ORDER — ACETAMINOPHEN 650 MG RE SUPP
650.0000 mg | RECTAL | Status: DC | PRN
  Filled 2011-09-19: qty 1

## 2011-09-19 MED ORDER — ENOXAPARIN SODIUM 30 MG/0.3ML ~~LOC~~ SOLN
30.0000 mg | SUBCUTANEOUS | Status: DC
  Administered 2011-09-19 – 2011-09-21 (×3): 30 mg via SUBCUTANEOUS
  Filled 2011-09-19 (×3): qty 0.3

## 2011-09-19 MED ORDER — BISACODYL 10 MG RE SUPP
10.0000 mg | Freq: Every day | RECTAL | Status: DC | PRN
  Administered 2011-09-22: 10 mg via RECTAL
  Filled 2011-09-19: qty 1

## 2011-09-19 MED ORDER — SODIUM CHLORIDE 0.9 % IV SOLN
INTRAVENOUS | Status: DC
  Administered 2011-09-19 – 2011-09-20 (×2): via INTRAVENOUS
  Administered 2011-09-21: 1000 mL via INTRAVENOUS
  Administered 2011-09-21 – 2011-09-22 (×3): via INTRAVENOUS

## 2011-09-19 MED ORDER — TRAZODONE HCL 50 MG PO TABS
50.0000 mg | ORAL_TABLET | Freq: Every day | ORAL | Status: DC
  Administered 2011-09-19 – 2011-09-23 (×5): 50 mg via ORAL
  Filled 2011-09-19 (×6): qty 1

## 2011-09-19 MED ORDER — SERTRALINE HCL 50 MG PO TABS
150.0000 mg | ORAL_TABLET | ORAL | Status: DC
  Administered 2011-09-19 – 2011-09-24 (×6): 150 mg via ORAL
  Filled 2011-09-19: qty 3
  Filled 2011-09-19: qty 2
  Filled 2011-09-19 (×4): qty 3
  Filled 2011-09-19: qty 1

## 2011-09-19 MED ORDER — DEXTROSE 5 % IV SOLN
INTRAVENOUS | Status: AC
  Filled 2011-09-19: qty 1

## 2011-09-19 MED ORDER — LEVOFLOXACIN IN D5W 750 MG/150ML IV SOLN
750.0000 mg | INTRAVENOUS | Status: DC
  Administered 2011-09-19 – 2011-09-22 (×4): 750 mg via INTRAVENOUS
  Filled 2011-09-19 (×6): qty 150

## 2011-09-19 MED ORDER — LEVOFLOXACIN IN D5W 750 MG/150ML IV SOLN
INTRAVENOUS | Status: AC
  Filled 2011-09-19: qty 150

## 2011-09-19 MED ORDER — ONDANSETRON HCL 4 MG PO TABS
4.0000 mg | ORAL_TABLET | ORAL | Status: DC | PRN
  Filled 2011-09-19: qty 1

## 2011-09-19 MED ORDER — VANCOMYCIN HCL IN DEXTROSE 1-5 GM/200ML-% IV SOLN
1000.0000 mg | Freq: Two times a day (BID) | INTRAVENOUS | Status: DC
  Administered 2011-09-19 – 2011-09-20 (×4): 1000 mg via INTRAVENOUS
  Filled 2011-09-19 (×5): qty 200

## 2011-09-19 MED ORDER — HYDROMORPHONE HCL PF 1 MG/ML IJ SOLN
0.5000 mg | Freq: Once | INTRAMUSCULAR | Status: AC
  Administered 2011-09-19: 0.5 mg via INTRAVENOUS
  Filled 2011-09-19: qty 1

## 2011-09-19 MED ORDER — FAMOTIDINE 20 MG PO TABS
20.0000 mg | ORAL_TABLET | Freq: Two times a day (BID) | ORAL | Status: DC
  Administered 2011-09-19 – 2011-09-24 (×11): 20 mg via ORAL
  Filled 2011-09-19 (×11): qty 1

## 2011-09-19 MED ORDER — INSULIN ASPART 100 UNIT/ML ~~LOC~~ SOLN
0.0000 [IU] | Freq: Three times a day (TID) | SUBCUTANEOUS | Status: DC
  Administered 2011-09-19: 3 [IU] via SUBCUTANEOUS
  Administered 2011-09-19: 7 [IU] via SUBCUTANEOUS
  Administered 2011-09-19: 2 [IU] via SUBCUTANEOUS
  Administered 2011-09-20: 1 [IU] via SUBCUTANEOUS
  Administered 2011-09-20: 9 [IU] via SUBCUTANEOUS
  Administered 2011-09-20 – 2011-09-22 (×2): 5 [IU] via SUBCUTANEOUS
  Administered 2011-09-22: 3 [IU] via SUBCUTANEOUS
  Administered 2011-09-23 – 2011-09-24 (×2): 5 [IU] via SUBCUTANEOUS
  Administered 2011-09-24: 7 [IU] via SUBCUTANEOUS
  Filled 2011-09-19 (×3): qty 3

## 2011-09-19 MED ORDER — HYDROMORPHONE HCL PF 1 MG/ML IJ SOLN
0.5000 mg | INTRAMUSCULAR | Status: DC | PRN
  Administered 2011-09-19: 1 mg via INTRAVENOUS
  Filled 2011-09-19: qty 1

## 2011-09-19 MED ORDER — GLUCERNA SHAKE PO LIQD
237.0000 mL | Freq: Two times a day (BID) | ORAL | Status: DC
  Administered 2011-09-19 – 2011-09-24 (×9): 237 mL via ORAL

## 2011-09-19 MED ORDER — HYDROMORPHONE HCL PF 2 MG/ML IJ SOLN
2.0000 mg | INTRAMUSCULAR | Status: DC | PRN
  Administered 2011-09-22: 2 mg via INTRAVENOUS
  Filled 2011-09-19: qty 2

## 2011-09-19 NOTE — Consult Note (Signed)
ANTIBIOTIC CONSULT NOTE - INITIAL  Pharmacy Consult for Vancomycin Indication: pneumonia Also on Levaquin and Cefepime  No Known Allergies  Patient Measurements: Height: 6' (182.9 cm) Weight: 165 lb 5.5 oz (75 kg) IBW/kg (Calculated) : 77.6   Vital Signs: Temp: 98.3 F (36.8 C) (01/16 0430) Temp src: Oral (01/16 0430) BP: 103/34 mmHg (01/16 0600) Pulse Rate: 81  (01/16 0600) Intake/Output from previous day: 01/15 0701 - 01/16 0700 In: -  Out: 50 [Urine:50] Intake/Output from this shift:    Labs:  Basename 09/18/11 2249  WBC 11.3*  HGB 9.7*  PLT 199  LABCREA --  CREATININE 1.87*   Estimated Creatinine Clearance: 50.7 ml/min (by C-G formula based on Cr of 1.87). No results found for this basename: VANCOTROUGH:2,VANCOPEAK:2,VANCORANDOM:2,GENTTROUGH:2,GENTPEAK:2,GENTRANDOM:2,TOBRATROUGH:2,TOBRAPEAK:2,TOBRARND:2,AMIKACINPEAK:2,AMIKACINTROU:2,AMIKACIN:2, in the last 72 hours   Microbiology: Recent Results (from the past 720 hour(s))  CULTURE, BLOOD (ROUTINE X 2)     Status: Normal (Preliminary result)   Collection Time   09/18/11 10:50 PM      Component Value Range Status Comment   Specimen Description RIGHT ANTECUBITAL   Final    Special Requests BOTTLES DRAWN AEROBIC AND ANAEROBIC 5CC   Final    Culture PENDING   Incomplete    Report Status PENDING   Incomplete   CULTURE, BLOOD (ROUTINE X 2)     Status: Normal (Preliminary result)   Collection Time   09/18/11 10:50 PM      Component Value Range Status Comment   Specimen Description BLOOD RIGHT HAND   Final    Special Requests BOTTLES DRAWN AEROBIC AND ANAEROBIC 5CC   Final    Culture PENDING   Incomplete    Report Status PENDING   Incomplete   MRSA PCR SCREENING     Status: Normal   Collection Time   09/19/11  4:27 AM      Component Value Range Status Comment   MRSA by PCR NEGATIVE  NEGATIVE  Final     Medical History: Past Medical History  Diagnosis Date  . Renal disorder   . Elective mutism   . Autism     . Hyperosmolar (nonketotic) coma   . Hyperglycemia   . Diabetes mellitus   . Hyponatremia   . Mental retardation   . Urinary retention   . BPH (benign prostatic hyperplasia)   . Gastroparesis   . Depression     Medications:  Scheduled:    . sodium chloride   Intravenous Once  . acetaminophen  1,000 mg Oral Once  . ceFEPime (MAXIPIME) IV  1 g Intravenous Q8H  . enoxaparin  30 mg Subcutaneous Q24H  . famotidine  20 mg Oral BID  . HYDROmorphone  0.5 mg Intravenous Once  . insulin aspart  0-5 Units Subcutaneous QHS  . insulin aspart  0-9 Units Subcutaneous TID WC  . insulin glargine  40 Units Subcutaneous QHS  . levofloxacin (LEVAQUIN) IV  750 mg Intravenous Q24H  . piperacillin-tazobactam (ZOSYN)  IV  3.375 g Intravenous Once  . sertraline  150 mg Oral Q0700  . sodium chloride  500 mL Intravenous Once  . Tamsulosin HCl  0.4 mg Oral Q0700  . traZODone  50 mg Oral QHS  . vancomycin  1,000 mg Intravenous Once  . vancomycin  1,000 mg Intravenous Q12H  . ziprasidone  40 mg Oral BID WC   Assessment: 51yo with clcr ~ 50  Goal of Therapy:  Vancomycin trough level 15-20 mcg/ml  Plan: Continue current Levaquin and Cefepime Rx Vancomycin 1gm  iv q12hrs Check trough at steady state Labs per protocol  Hart Robinsons A 09/19/2011,8:13 AM

## 2011-09-19 NOTE — H&P (Signed)
PCP:   FANTA,TESFAYE, MD   Chief Complaint:  Fever and abdominal pain unclear duration  HPI: [name] is an 51 y.o. African American male.   Mental retardation and poor verbal skills making history difficult. The patient presents with a history of a temperature of 105, cough and abdominal pain. Unable to get further detailed; no history of vomiting or diarrhea; no history of syncope.  Patient does have a chronic indwelling Foley for BPH  Chest x-ray showed pneumonia and the hospitalist service was called to assist.  Rewiew of Systems:  Unable to get a full review of systems because of patient's condition   Past Medical History  Diagnosis Date  . Renal disorder   . Elective mutism   . Autism   . Hyperosmolar (nonketotic) coma   . Hyperglycemia   . Diabetes mellitus   . Hyponatremia   . Mental retardation   . Urinary retention   . BPH (benign prostatic hyperplasia)   . Gastroparesis   . Depression     History reviewed. No pertinent past surgical history.  Medications:  HOME MEDS: Prior to Admission medications   Medication Sig Start Date End Date Taking? Authorizing Provider  insulin aspart (NOVOLOG) 100 UNIT/ML injection Inject 15 Units into the skin 3 (three) times daily before meals. Per sliding scale instructions: If 150-200= 1 unit, 201-250= 2 units, 251-300= 3 units, 301-350= 4 units, 351-400= 5 units, Greater than 400= 6 units   Yes Historical Provider, MD  insulin glargine (LANTUS) 100 UNIT/ML injection Inject 40 Units into the skin at bedtime.    Yes Historical Provider, MD  lisinopril (PRINIVIL,ZESTRIL) 10 MG tablet Take 10 mg by mouth every morning.    Yes Historical Provider, MD  ranitidine (ZANTAC) 150 MG capsule Take 150 mg by mouth 2 (two) times daily.     Yes Historical Provider, MD  sertraline (ZOLOFT) 50 MG tablet Take 150 mg by mouth every morning.    Yes Historical Provider, MD  sulfamethoxazole-trimethoprim (BACTRIM DS) 800-160 MG per tablet  Take 1 tablet by mouth every evening.  03/27/11 10/22/11 Yes Historical Provider, MD  Tamsulosin HCl (FLOMAX) 0.4 MG CAPS Take 0.4 mg by mouth every morning.    Yes Historical Provider, MD  traZODone (DESYREL) 50 MG tablet Take 50 mg by mouth at bedtime.     Yes Historical Provider, MD  ziprasidone (GEODON) 40 MG capsule Take 40 mg by mouth 2 (two) times daily with a meal.     Yes Historical Provider, MD     Allergies:  No Known Allergies  Social History:   reports that he has never smoked. He does not have any smokeless tobacco history on file. He reports that he does not drink alcohol or use illicit drugs.  Family History: No family history on file.   Physical Exam: Filed Vitals:   09/18/11 2206 09/18/11 2211 09/19/11 0009  BP: 123/71  121/79  Pulse: 109  85  Temp: 99.9 F (37.7 C) 104.6 F (40.3 C) 102.1 F (38.9 C)  TempSrc: Oral Rectal Rectal  Resp: 22    SpO2: 100%  100%   Blood pressure 121/79, pulse 85, temperature 102.1 F (38.9 C), temperature source Rectal, resp. rate 22, SpO2 100.00%.  GEN:  Dehydrated middle-aged African American gentleman lying flat in the stretcher; writhing around with spontaneous abnormal seem related to his chronic condition rather than an acute problem. He will nod and shake his head in response to question.   PSYCH:  alert and  oriented x1;  HEENT: Mucous membranes pink and anicteric; t; no cervical lymphadenopathy nor thyromegaly or carotid bruit; no JVD; Breasts:: Not examined CHEST WALL: No tenderness CHEST: Chest is surprisingly clear to auscultation  HEART: Regular rate and rhythm; no murmurs rubs or gallops BACK:  no CVA tenderness ABDOMEN: , soft lower abdominal-tender; ; no masses, no organomegaly, normal abdominal bowel sounds; no pannus; no intertriginous candida. Rectal Exam: Not done EXTREMITIES: ; no edema; no ulcerations. Genitalia: not examined PULSES: 2+ and symmetric SKIN: Normal hydration no rash or ulceration CNS:  Cranial nerves 2-12 grossly intact no focal neurologic deficit   Labs & Imaging Results for orders placed during the hospital encounter of 09/18/11 (from the past 48 hour(s))  URINALYSIS, ROUTINE W REFLEX MICROSCOPIC     Status: Abnormal   Collection Time   09/18/11 10:43 PM      Component Value Range Comment   Color, Urine YELLOW  YELLOW     APPearance HAZY (*) CLEAR     Specific Gravity, Urine 1.025  1.005 - 1.030     pH 7.0  5.0 - 8.0     Glucose, UA 500 (*) NEGATIVE (mg/dL)    Hgb urine dipstick TRACE (*) NEGATIVE     Bilirubin Urine NEGATIVE  NEGATIVE     Ketones, ur NEGATIVE  NEGATIVE (mg/dL)    Protein, ur 30 (*) NEGATIVE (mg/dL)    Urobilinogen, UA 1.0  0.0 - 1.0 (mg/dL)    Nitrite NEGATIVE  NEGATIVE     Leukocytes, UA MODERATE (*) NEGATIVE    URINE MICROSCOPIC-ADD ON     Status: Abnormal   Collection Time   09/18/11 10:43 PM      Component Value Range Comment   Squamous Epithelial / LPF FEW (*) RARE     WBC, UA TOO NUMEROUS TO COUNT  <3 (WBC/hpf)    RBC / HPF 3-6  <3 (RBC/hpf)    Bacteria, UA MANY (*) RARE     Crystals HIPPURIC ACID CRYSTALS (*) NEGATIVE    CBC     Status: Abnormal   Collection Time   09/18/11 10:49 PM      Component Value Range Comment   WBC 11.3 (*) 4.0 - 10.5 (K/uL)    RBC 3.53 (*) 4.22 - 5.81 (MIL/uL)    Hemoglobin 9.7 (*) 13.0 - 17.0 (g/dL)    HCT 28.8 (*) 39.0 - 52.0 (%)    MCV 81.6  78.0 - 100.0 (fL)    MCH 27.5  26.0 - 34.0 (pg)    MCHC 33.7  30.0 - 36.0 (g/dL)    RDW 13.6  11.5 - 15.5 (%)    Platelets 199  150 - 400 (K/uL)   DIFFERENTIAL     Status: Abnormal   Collection Time   09/18/11 10:49 PM      Component Value Range Comment   Neutrophils Relative 94 (*) 43 - 77 (%)    Neutro Abs 10.6 (*) 1.7 - 7.7 (K/uL)    Lymphocytes Relative 3 (*) 12 - 46 (%)    Lymphs Abs 0.4 (*) 0.7 - 4.0 (K/uL)    Monocytes Relative 3  3 - 12 (%)    Monocytes Absolute 0.3  0.1 - 1.0 (K/uL)    Eosinophils Relative 0  0 - 5 (%)    Eosinophils Absolute 0.0   0.0 - 0.7 (K/uL)    Basophils Relative 0  0 - 1 (%)    Basophils Absolute 0.0  0.0 - 0.1 (K/uL)  BASIC METABOLIC PANEL     Status: Abnormal   Collection Time   09/18/11 10:49 PM      Component Value Range Comment   Sodium 131 (*) 135 - 145 (mEq/L)    Potassium 5.5 (*) 3.5 - 5.1 (mEq/L)    Chloride 101  96 - 112 (mEq/L)    CO2 19  19 - 32 (mEq/L)    Glucose, Bld 321 (*) 70 - 99 (mg/dL)    BUN 40 (*) 6 - 23 (mg/dL)    Creatinine, Ser 1.87 (*) 0.50 - 1.35 (mg/dL)    Calcium 9.5  8.4 - 10.5 (mg/dL)    GFR calc non Af Amer 41 (*) >90 (mL/min)    GFR calc Af Amer 47 (*) >90 (mL/min)   CULTURE, BLOOD (ROUTINE X 2)     Status: Normal (Preliminary result)   Collection Time   09/18/11 10:50 PM      Component Value Range Comment   Specimen Description RIGHT ANTECUBITAL      Special Requests BOTTLES DRAWN AEROBIC AND ANAEROBIC 5CC      Culture PENDING      Report Status PENDING     CULTURE, BLOOD (ROUTINE X 2)     Status: Normal (Preliminary result)   Collection Time   09/18/11 10:50 PM      Component Value Range Comment   Specimen Description BLOOD RIGHT HAND      Special Requests BOTTLES DRAWN AEROBIC AND ANAEROBIC 5CC      Culture PENDING      Report Status PENDING     LACTIC ACID, PLASMA     Status: Abnormal   Collection Time   09/18/11 11:28 PM      Component Value Range Comment   Lactic Acid, Venous 2.6 (*) 0.5 - 2.2 (mmol/L)    Dg Chest Port 1 View  09/18/2011  *RADIOLOGY REPORT*  Clinical Data: Fever, hyperglycemia  PORTABLE CHEST - 1 VIEW  Comparison: 01/20/2011  Findings: Normal heart size and vascularity.  Slight increased left hilar opacity compared to the prior studies, suspicious for atelectasis versus early left perihilar pneumonia.  Trachea midline.  Right lung remains clear.  Stable blunting of the right costophrenic angle.  No pneumothorax.  IMPRESSION: Mild left perihilar patchy airspace opacity, suspicious for atelectasis or developing pneumonia.  Recommend radiographic  follow- up to document complete resolution.  Original Report Authenticated By: Jerilynn Mages. Daryll Brod, M.D.      Assessment Present on Admission:  .Sepsis  .Type II or unspecified type diabetes mellitus without mention of complication, not stated as uncontrolled .Acute renal failure .Pneumonia -healthcare associated   .UTI (lower urinary tract infection) .Mental retardation .BPH (benign prostatic hyperplasia)   PLAN: Admit this gentleman to the step down unit for management of impending sepsis; will give high rate IV fluid, broad-spectrum antibiotics; cultures of the blood and urine;  Because of complains of abdominal pain will get a CT scan of his abdomen, and but will be unable to use contrast because of his acute renal failure.   Other plans as per orders.  Critical care time: 60 minutes.  [name] 09/19/2011, 1:31 AM

## 2011-09-19 NOTE — ED Notes (Signed)
Moaning and motioning that he is having pain in abdomen.  Points to epigastric area and the area above unbilicus - generalized pain.  Skin warm and dry.

## 2011-09-19 NOTE — Progress Notes (Signed)
UR Chart Review Completed  

## 2011-09-19 NOTE — Progress Notes (Addendum)
INITIAL ADULT NUTRITION ASSESSMENT Date: 09/19/2011   Time: 1:20 PM  Reason for Assessment: Pt appears malnourished  ASSESSMENT: Male 51 y.o.  Dx: Sepsis    Past Medical History  Diagnosis Date  . Renal disorder   . Elective mutism   . Autism   . Hyperosmolar (nonketotic) coma   . Hyperglycemia   . Diabetes mellitus   . Hyponatremia   . Mental retardation   . Urinary retention   . BPH (benign prostatic hyperplasia)   . Gastroparesis   . Depression     Scheduled Meds:   . sodium chloride   Intravenous Once  . acetaminophen  1,000 mg Oral Once  . ceFEPime (MAXIPIME) IV  1 g Intravenous Q8H  . enoxaparin  30 mg Subcutaneous Q24H  . famotidine  20 mg Oral BID  . HYDROmorphone  0.5 mg Intravenous Once  . insulin aspart  0-5 Units Subcutaneous QHS  . insulin aspart  0-9 Units Subcutaneous TID WC  . insulin glargine  40 Units Subcutaneous QHS  . levofloxacin (LEVAQUIN) IV  750 mg Intravenous Q24H  . piperacillin-tazobactam (ZOSYN)  IV  3.375 g Intravenous Once  . sertraline  150 mg Oral Q0700  . sodium chloride  500 mL Intravenous Once  . Tamsulosin HCl  0.4 mg Oral Q0700  . traZODone  50 mg Oral QHS  . vancomycin  1,000 mg Intravenous Once  . vancomycin  1,000 mg Intravenous Q12H  . ziprasidone  40 mg Oral BID WC   Continuous Infusions:   . sodium chloride 125 mL/hr at 09/19/11 1100  . DISCONTD: sodium chloride 125 mL/hr at 09/18/11 2310   PRN Meds:.acetaminophen, acetaminophen, bisacodyl, HYDROmorphone (DILAUDID) injection, ondansetron (ZOFRAN) IV, ondansetron, sodium phosphate, DISCONTD:  HYDROmorphone (DILAUDID) injection  Ht: 6' (182.9 cm)  Wt: 165 lb 5.5 oz (75 kg)  Ideal Wt:  77.6 kg % Ideal Wt: 96%  Usual Wt: Pt unable to provide    Body mass index is 22.42 kg/(m^2).  Food/Nutrition Related Hx: Limited hx obtained due to pt limited verbal interaction. He will answer yes/no questions by nodding his head. Denies swallow chewing difficulty.Po intake  poor-fair (50%); inadequate oral intake coupled with increased nutrient needs associated with sepsis, and electrolyte abnormalities.    CMP     Component Value Date/Time   NA 131* 09/18/2011 2249   K 5.5* 09/18/2011 2249   CL 101 09/18/2011 2249   CO2 19 09/18/2011 2249   GLUCOSE 321* 09/18/2011 2249   BUN 40* 09/18/2011 2249   CREATININE 1.87* 09/18/2011 2249   CALCIUM 9.5 09/18/2011 2249   PROT 7.1 09/19/2011 0126   ALBUMIN 3.5 09/19/2011 0126   AST 32 09/19/2011 0126   ALT 36 09/19/2011 0126   ALKPHOS 84 09/19/2011 0126   BILITOT 0.7 09/19/2011 0126   GFRNONAA 41* 09/18/2011 2249   GFRAA 47* 09/18/2011 2249   CBG (last 3)   Basename 09/19/11 0745 09/19/11 0441 09/19/11 0331  GLUCAP 250* 218* 197*    Intake/Output Summary (Last 24 hours) at 09/19/11 1327 Last data filed at 09/19/11 1100  Gross per 24 hour  Intake   1400 ml  Output     50 ml  Net   1350 ml    Diet Order: Heart Healthy diet  Supplements/Tube Feeding:none at this time  IVF:    sodium chloride Last Rate: 125 mL/hr at 09/19/11 1100  DISCONTD: sodium chloride Last Rate: 125 mL/hr at 09/18/11 2310    Estimated Nutritional Needs:   Kcal:1875-2100  Protein:112-128 gr Fluid:1.9-2.1 L/d  NUTRITION DIAGNOSIS: -Inadequate oral intake (NI-2.1).  Status: Ongoing  RELATED TO: infection  AS EVIDENCE BY: sepsis and observed meal intake 0-50%  MONITORING: po intake, glucose, electrolytes and wt changes  GOAL: Pt will improve oral intake to meet >85% of est nutritional needs  EDUCATION NEEDS: -Education not appropriate at this time due pt unable to participate  INTERVENTION: -Agree with CDE rec CHO Modified Medium diet -Add Glucerna BID between meals -ProStat 30 ml BID  Dietitian [id number]  DOCUMENTATION CODES Per approved criteria  -Not Applicable    Frederik Schmidt 09/19/2011, 1:20 PM

## 2011-09-19 NOTE — Progress Notes (Signed)
Inpatient Diabetes Program Recommendations  AACE/ADA: New Consensus Statement on Inpatient Glycemic Control (2009)  Target Ranges:  Prepandial:   less than 140 mg/dL      Peak postprandial:   less than 180 mg/dL (1-2 hours)      Critically ill patients:  140 - 180 mg/dL   Reason for Visit: Type 1 DM  Inpatient Diabetes Program Recommendations Insulin - Meal Coverage: Add meal coverage:  Patient takes Novolog 15 units with each meal Diet: Add CHO modified medium to diet

## 2011-09-19 NOTE — Plan of Care (Signed)
Problem: Consults Goal: Diabetes Guidelines if Diabetic/Glucose > 140 If diabetic or lab glucose is > 140 mg/dl - Initiate Diabetes/Hyperglycemia Guidelines & Document Interventions  Outcome: Progressing Pt CBG 332 on admission  In ED and now on s/s AC/HS  Problem: Phase I Progression Outcomes Goal: Pain controlled with appropriate interventions Outcome: Progressing Pt received 0.5mg  of dilaudid in ED for c/o abdominal pain & now pt is sleeping soundly. Pt is for abdominal ultrasound in AM Goal: Hemodynamically stable Outcome: Progressing VS are stable. Pt temp in ED rectal temp= 104.6  and given tylenol  Chest xray =PNA  & pt is on droplet precautions

## 2011-09-19 NOTE — Progress Notes (Signed)
NAME:  [name], [name]           ACCOUNT NO.:  [id number]  MEDICAL RECORD NO.:  [id number]  LOCATION:  IC11                          FACILITY:  [facility]  PHYSICIAN:  Kenidee Cregan D. Legrand Rams, MD   DATE OF BIRTH:  1961-07-10  DATE OF PROCEDURE:  09/19/2011 DATE OF DISCHARGE:                                PROGRESS NOTE   SUBJECTIVE:  The patient complains of lower abdominal pain.  He was admitted last night with sepsis and UTI.  OBJECTIVE:  GENERAL:  The patient is alert and awake, but sick looking. VITAL SIGNS:  Blood pressure 123/71, pulse 109, respiratory rate 35, temperature 104.6. CHEST:  Decreased air entry, bilateral rhonchi. CARDIOVASCULAR SYSTEM:  First and second heart sounds heard.  No murmur. No gallop. ABDOMEN:  Soft and lax.  Bowel sound is positive.  There is tenderness of lower abdominal area. EXTREMITIES:  No leg edema.  ASSESSMENT: 1. Probably sepsis. 2. Urinary tract infection. 3. Rule out pneumonia. 4. Diabetes mellitus. 5. History of mental retardation.  PLAN:  Continue the patient on IV antibiotics.  We will follow cultures result.  We will continue his regular medications, and continue supportive care.     Laquitta Dominski D. Legrand Rams, MD     TDF/MEDQ  D:  09/19/2011  T:  09/19/2011  Job:  [id number]

## 2011-09-19 NOTE — Progress Notes (Signed)
Discontinued droplet precautions due to negative influenza panel.

## 2011-09-19 NOTE — ED Notes (Signed)
Foley removed per order and new foley placed by A Dillard, NT.

## 2011-09-20 LAB — GLUCOSE, CAPILLARY: Glucose-Capillary: 369 mg/dL — ABNORMAL HIGH (ref 70–99)

## 2011-09-20 LAB — CBC
HCT: 29 % — ABNORMAL LOW (ref 39.0–52.0)
Hemoglobin: 9.5 g/dL — ABNORMAL LOW (ref 13.0–17.0)
MCH: 27.1 pg (ref 26.0–34.0)
MCHC: 32.8 g/dL (ref 30.0–36.0)

## 2011-09-20 LAB — BASIC METABOLIC PANEL
BUN: 34 mg/dL — ABNORMAL HIGH (ref 6–23)
Calcium: 9.7 mg/dL (ref 8.4–10.5)
GFR calc non Af Amer: 50 mL/min — ABNORMAL LOW (ref >90)
Glucose, Bld: 123 mg/dL — ABNORMAL HIGH (ref 70–99)

## 2011-09-20 MED ORDER — SODIUM CHLORIDE 0.9 % IJ SOLN
INTRAMUSCULAR | Status: AC
  Filled 2011-09-20: qty 3

## 2011-09-20 MED ORDER — VANCOMYCIN HCL IN DEXTROSE 1-5 GM/200ML-% IV SOLN
INTRAVENOUS | Status: AC
  Filled 2011-09-20: qty 200

## 2011-09-20 MED ORDER — SODIUM CHLORIDE 0.9 % IJ SOLN
INTRAMUSCULAR | Status: AC
  Administered 2011-09-20: 10 mL
  Filled 2011-09-20: qty 3

## 2011-09-20 MED ORDER — DEXTROSE 5 % IV SOLN
INTRAVENOUS | Status: AC
  Filled 2011-09-20 (×2): qty 1

## 2011-09-20 MED ORDER — LEVOFLOXACIN IN D5W 750 MG/150ML IV SOLN
INTRAVENOUS | Status: AC
  Filled 2011-09-20: qty 150

## 2011-09-20 NOTE — Consult Note (Signed)
ANTIBIOTIC CONSULT NOTE   Pharmacy Consult for Vancomycin Indication: pneumonia Also on Levaquin and Cefepime  No Known Allergies  Patient Measurements: Height: 6' (182.9 cm) Weight: 167 lb 8.8 oz (76 kg) IBW/kg (Calculated) : 77.6   Vital Signs: Temp: 98.7 F (37.1 C) (01/17 0400) Temp src: Oral (01/17 0400) BP: 125/64 mmHg (01/17 0600) Pulse Rate: 74  (01/17 0600) Intake/Output from previous day: 01/16 0701 - 01/17 0700 In: 4445 [P.O.:1020; I.V.:2875; IV Piggyback:550] Out: 2850 [Urine:2850] Intake/Output from this shift:    Labs:  [facility] 09/20/11 0454 09/18/11 2249  WBC 12.5* 11.3*  HGB 9.5* 9.7*  PLT 197 199  LABCREA -- --  CREATININE 1.57* 1.87*   Estimated Creatinine Clearance: 61.2 ml/min (by C-G formula based on Cr of 1.57). No results found for this basename: VANCOTROUGH:2,VANCOPEAK:2,VANCORANDOM:2,GENTTROUGH:2,GENTPEAK:2,GENTRANDOM:2,TOBRATROUGH:2,TOBRAPEAK:2,TOBRARND:2,AMIKACINPEAK:2,AMIKACINTROU:2,AMIKACIN:2, in the last 72 hours   Microbiology: Recent Results (from the past 720 hour(s))  CULTURE, BLOOD (ROUTINE X 2)     Status: Normal (Preliminary result)   Collection Time   09/18/11 10:50 PM      Component Value Range Status Comment   Specimen Description RIGHT ANTECUBITAL   Final    Special Requests BOTTLES DRAWN AEROBIC AND ANAEROBIC 5CC   Final    Culture NO GROWTH 1 DAY   Final    Report Status PENDING   Incomplete   CULTURE, BLOOD (ROUTINE X 2)     Status: Normal (Preliminary result)   Collection Time   09/18/11 10:50 PM      Component Value Range Status Comment   Specimen Description BLOOD RIGHT HAND   Final    Special Requests BOTTLES DRAWN AEROBIC AND ANAEROBIC 5CC   Final    Culture NO GROWTH 1 DAY   Final    Report Status PENDING   Incomplete   MRSA PCR SCREENING     Status: Normal   Collection Time   09/19/11  4:27 AM      Component Value Range Status Comment   MRSA by PCR NEGATIVE  NEGATIVE  Final     Medical History: Past  Medical History  Diagnosis Date  . Renal disorder   . Elective mutism   . Autism   . Hyperosmolar (nonketotic) coma   . Hyperglycemia   . Diabetes mellitus   . Hyponatremia   . Mental retardation   . Urinary retention   . BPH (benign prostatic hyperplasia)   . Gastroparesis   . Depression     Medications:  Scheduled:     . ceFEPime (MAXIPIME) IV  1 g Intravenous Q8H  . enoxaparin  30 mg Subcutaneous Q24H  . famotidine  20 mg Oral BID  . feeding supplement  237 mL Oral BID  . feeding supplement  30 mL Oral TID WC  . insulin aspart  0-5 Units Subcutaneous QHS  . insulin aspart  0-9 Units Subcutaneous TID WC  . insulin glargine  40 Units Subcutaneous QHS  . levofloxacin (LEVAQUIN) IV  750 mg Intravenous Q24H  . sertraline  150 mg Oral Q0700  . Tamsulosin HCl  0.4 mg Oral Q0700  . traZODone  50 mg Oral QHS  . vancomycin  1,000 mg Intravenous Q12H  . ziprasidone  40 mg Oral BID WC   Assessment: 50yo with clcr ~ 61 SCr improving  Goal of Therapy:  Vancomycin trough level 15-20 mcg/ml  Plan: Continue current Levaquin and Cefepime Rx Vancomycin 1gm iv q12hrs Check trough tomorrow am Labs per protocol  Hart Robinsons A 09/20/2011,8:04 AM

## 2011-09-20 NOTE — Progress Notes (Signed)
NAME:  [name], [name]           ACCOUNT NO.:  [id number]  MEDICAL RECORD NO.:  [id number]  LOCATION:  IC11                          FACILITY:  [facility]  PHYSICIAN:  Jenissa Tyrell D. Legrand Rams, MD   DATE OF BIRTH:  10/02/1960  DATE OF PROCEDURE:  09/20/2011 DATE OF DISCHARGE:                                PROGRESS NOTE   SUBJECTIVE:  The patient feels better.  His fever has subsided.  His abdominal pain also has improved.  OBJECTIVE:  GENERAL:  The patient is alert, awake, and resting. VITAL SIGNS:  Blood pressure 104/61, pulse 67, respiratory rate 25, temperature 99 degrees Fahrenheit. CHEST:  Decreased air entry, few rhonchi. CARDIOVASCULAR:  First and second heart sound heard.  No murmur.  No gallop. ABDOMEN:  Soft and lax.  Bowel sound is positive.  No mass or organomegaly. EXTREMITIES:  No leg edema.  ASSESSMENT: 1. Probably sepsis. 2. Urinary tract infection. 3. History of mental retardation. 4. Diabetes mellitus.  PLAN:  We will continue the patient on empiric IV antibiotics.  We will follow culture results.  Continue regular treatment.     Osha Errico D. Legrand Rams, MD     TDF/MEDQ  D:  09/20/2011  T:  09/20/2011  Job:  [id number]

## 2011-09-20 NOTE — Progress Notes (Signed)
Report given to [place], RN on Dept 300.  Pt is alert and oriented.  Have explained to pt the plan of care for him and he nods that he understands.  Pt taken up to room 323 via wheelchair by L. Webster, NT.  Pt's belongings taken to room and pt placed on telemetry.

## 2011-09-21 LAB — BASIC METABOLIC PANEL
BUN: 29 mg/dL — ABNORMAL HIGH (ref 6–23)
Calcium: 9.9 mg/dL (ref 8.4–10.5)
GFR calc Af Amer: 63 mL/min — ABNORMAL LOW (ref >90)
GFR calc non Af Amer: 55 mL/min — ABNORMAL LOW (ref >90)
Glucose, Bld: 127 mg/dL — ABNORMAL HIGH (ref 70–99)
Sodium: 136 mEq/L (ref 135–145)

## 2011-09-21 LAB — GLUCOSE, CAPILLARY
Glucose-Capillary: 112 mg/dL — ABNORMAL HIGH (ref 70–99)
Glucose-Capillary: 498 mg/dL — ABNORMAL HIGH (ref 70–99)
Glucose-Capillary: 116 mg/dL — ABNORMAL HIGH (ref 70–99)

## 2011-09-21 MED ORDER — INSULIN ASPART 100 UNIT/ML ~~LOC~~ SOLN
8.0000 [IU] | Freq: Three times a day (TID) | SUBCUTANEOUS | Status: DC
  Administered 2011-09-23: 8 [IU] via SUBCUTANEOUS

## 2011-09-21 MED ORDER — INSULIN GLARGINE 100 UNIT/ML ~~LOC~~ SOLN
30.0000 [IU] | Freq: Every day | SUBCUTANEOUS | Status: DC
  Administered 2011-09-21 – 2011-09-22 (×2): 30 [IU] via SUBCUTANEOUS

## 2011-09-21 MED ORDER — INSULIN ASPART 100 UNIT/ML ~~LOC~~ SOLN
25.0000 [IU] | Freq: Once | SUBCUTANEOUS | Status: AC
  Administered 2011-09-21: 25 [IU] via SUBCUTANEOUS

## 2011-09-21 MED ORDER — ENOXAPARIN SODIUM 40 MG/0.4ML ~~LOC~~ SOLN
40.0000 mg | SUBCUTANEOUS | Status: DC
  Administered 2011-09-22 – 2011-09-24 (×3): 40 mg via SUBCUTANEOUS
  Filled 2011-09-21 (×3): qty 0.4

## 2011-09-21 MED ORDER — VANCOMYCIN HCL 1000 MG IV SOLR
1500.0000 mg | INTRAVENOUS | Status: DC
  Administered 2011-09-21 – 2011-09-23 (×3): 1500 mg via INTRAVENOUS
  Filled 2011-09-21 (×4): qty 1500

## 2011-09-21 NOTE — Progress Notes (Signed)
CARE MANAGEMENT NOTE 09/21/2011  Patient:  [name], [name]   Account Number:  [id number]  Date Initiated:  09/21/2011  Documentation initiated by:  Claretha Cooper  Subjective/Objective Assessment:   Pt admitted with UTI. PTA pt was a resident at [facility]. Per [facility], pt did not currently have [facility] services.     Action/Plan:   Pt will dc back to [facility] if possible. Previously, [place] saw this pt at [place] Date:  09/23/2011   Anticipated DC Plan:  ASSISTED LIVING / [place]  CM consult      Choice offered to / List presented to:             Status of service:  In process, will continue to follow Medicare Important Message given?   (If response is "NO", the following Medicare IM given date fields will be blank) Date Medicare IM given:   Date Additional Medicare IM given:    Discharge Disposition:    Per UR Regulation:    Comments:  09/21/11 [place] BSN CM If discharged over weekend and need [facility] services, please contact [place].

## 2011-09-21 NOTE — Progress Notes (Signed)
Inpatient Diabetes Program Recommendations  AACE/ADA: New Consensus Statement on Inpatient Glycemic Control (2009)  Target Ranges:  Prepandial:   less than 140 mg/dL      Peak postprandial:   less than 180 mg/dL (1-2 hours)      Critically ill patients:  140 - 180 mg/dL   Reason for Visit: Elevated prandial glucose: 128, 369, 255, 208 mg/dL  Inpatient Diabetes Program Recommendations Insulin - Meal Coverage: Add meal coverage:  Patient takes Novolog 15 units with each meal Diet: Add CHO modified medium to diet

## 2011-09-21 NOTE — Consult Note (Signed)
ANTIBIOTIC CONSULT NOTE   Pharmacy Consult for Vancomycin Indication: pneumonia Also on Levaquin and Cefepime  No Known Allergies  Patient Measurements: Height: 6' (182.9 cm) Weight: 167 lb 8.8 oz (76 kg) IBW/kg (Calculated) : 77.6   Vital Signs: Temp: 97.9 F (36.6 C) (01/18 0554) Temp src: Oral (01/18 0554) BP: 156/80 mmHg (01/18 0554) Pulse Rate: 71  (01/18 0554) Intake/Output from previous day: 01/17 0701 - 01/18 0700 In: 1930 [P.O.:680; I.V.:1000; IV Piggyback:250] Out: 2550 [Urine:2550] Intake/Output from this shift:    Labs:  Basename 09/21/11 0542 09/20/11 0454 09/18/11 2249  WBC -- 12.5* 11.3*  HGB -- 9.5* 9.7*  PLT -- 197 199  LABCREA -- -- --  CREATININE 1.46* 1.57* 1.87*   Estimated Creatinine Clearance: 65.8 ml/min (by C-G formula based on Cr of 1.46).  Basename 09/21/11 0838  VANCOTROUGH 27.7*  VANCOPEAK --  Jake Michaelis --  GENTTROUGH --  GENTPEAK --  GENTRANDOM --  TOBRATROUGH --  Shelah Lewandowsky --  TOBRARND --  AMIKACINPEAK --  AMIKACINTROU --  AMIKACIN --     Microbiology: Recent Results (from the past 720 hour(s))  URINE CULTURE     Status: Normal (Preliminary result)   Collection Time   09/18/11 10:43 PM      Component Value Range Status Comment   Specimen Description URINE, CATHETERIZED   Final    Special Requests NONE   Final    Setup Time [id number]   Final    Colony Count PENDING   Incomplete    Culture Culture reincubated for better growth   Final    Report Status PENDING   Incomplete   CULTURE, BLOOD (ROUTINE X 2)     Status: Normal (Preliminary result)   Collection Time   09/18/11 10:50 PM      Component Value Range Status Comment   Specimen Description RIGHT ANTECUBITAL   Final    Special Requests BOTTLES DRAWN AEROBIC AND ANAEROBIC 5CC   Final    Culture NO GROWTH 2 DAYS   Final    Report Status PENDING   Incomplete   CULTURE, BLOOD (ROUTINE X 2)     Status: Normal (Preliminary result)   Collection Time   09/18/11 10:50  PM      Component Value Range Status Comment   Specimen Description BLOOD RIGHT HAND   Final    Special Requests BOTTLES DRAWN AEROBIC AND ANAEROBIC 5CC   Final    Culture NO GROWTH 2 DAYS   Final    Report Status PENDING   Incomplete   MRSA PCR SCREENING     Status: Normal   Collection Time   09/19/11  4:27 AM      Component Value Range Status Comment   MRSA by PCR NEGATIVE  NEGATIVE  Final     Medical History: Past Medical History  Diagnosis Date  . Renal disorder   . Elective mutism   . Autism   . Hyperosmolar (nonketotic) coma   . Hyperglycemia   . Diabetes mellitus   . Hyponatremia   . Mental retardation   . Urinary retention   . BPH (benign prostatic hyperplasia)   . Gastroparesis   . Depression     Medications:  Scheduled:     . ceFEPime (MAXIPIME) IV  1 g Intravenous Q8H  . enoxaparin  30 mg Subcutaneous Q24H  . famotidine  20 mg Oral BID  . feeding supplement  237 mL Oral BID  . feeding supplement  30 mL Oral TID WC  .  insulin aspart  0-5 Units Subcutaneous QHS  . insulin aspart  0-9 Units Subcutaneous TID WC  . insulin glargine  40 Units Subcutaneous QHS  . levofloxacin (LEVAQUIN) IV  750 mg Intravenous Q24H  . sertraline  150 mg Oral Q0700  . sodium chloride      . sodium chloride      . Tamsulosin HCl  0.4 mg Oral Q0700  . traZODone  50 mg Oral QHS  . vancomycin  1,500 mg Intravenous Q24H  . ziprasidone  40 mg Oral BID WC  . DISCONTD: vancomycin  1,000 mg Intravenous Q12H   Assessment: Trough level above goal Vancomycin clearance not as good as predicted  Goal of Therapy:  Vancomycin trough level 15-20 mcg/ml  Plan: Continue current Levaquin and Cefepime Rx Change Vancomycin to 1500mg  iv q24hrs F/U trough next week Labs per protocol  Hart Robinsons A 09/21/2011,9:27 AM

## 2011-09-21 NOTE — Progress Notes (Signed)
Pt CBG 50 mg/dl.  Pt asymptomatic. 248ml grape juice given with snack.  Pt intake 100%.  Will reassess CBG in 33min.

## 2011-09-21 NOTE — Progress Notes (Signed)
Nutrition Progress Note Date: 09/21/11 Time: 9:15 AM   Received consult for diet education. Pt is not appropriate for diet education, due to inability to talk and hx of MR.  Refer to nutrition note dated 09/19/11 for assessment and interventions. Will continue to monitor.   Joaquim Lai, RD, LDN Pager: [phone]

## 2011-09-21 NOTE — Consult Note (Signed)
  [id number] 

## 2011-09-21 NOTE — Consult Note (Signed)
[name], [name]           ACCOUNT NO.:  [id number]  MEDICAL RECORD NO.:  [id number]  LOCATION:  [id number]                          FACILITY:  [facility]  PHYSICIAN:  Loni Beckwith, MD DATE OF BIRTH:  23-Oct-1960  DATE OF CONSULTATION:  09/21/2011 DATE OF DISCHARGE:                                CONSULTATION   REASON FOR CONSULT:  Uncontrolled diabetes.  HISTORY OF PRESENT ILLNESS:  This is a 51 year old African American gentleman with multiple medical problems, including autism with mental retardation, type 1 diabetes, recurrent urinary tract infection, gastroparesis, depression.  He is familiar to me as an outpatient for diabetes care.  He came to the hospital on January 15 with a complaint of fever at 105, cough, abdominal pain.  He was subsequently diagnosed with urinary tract infection, pneumonia, acute renal failure, and currently on treatment.  He was also found to have severe hyperglycemia. Hence Endocrinology consultation was requested.  The patient at home is on basal bolus insulin involving Lantus 30 units nightly and NovoLog 10 units t.i.d. before meals plus sliding scale.  He has uncontrolled diabetes for a long time with his A1c being 8.3% during this admission, however, has been higher in the past.  In house his blood sugar has been ranging between 200-400.  The patient is with significant mental retardation and detailed history is difficult to obtain from him.  PAST MEDICAL HISTORY:  As above.  PAST SURGICAL HISTORY:  Indwelling catheter for atonic bladder.  HOME MEDICATIONS: 1. NovoLog 10-15 units t.i.d. before meals. 2. Lantus 30-40 units nightly. 3. Zoloft 150 mg daily. 4. Bactrim DS 1 tab every day for urinary tract infection prophylaxis. 5. Tamsulosin 0.4 mg by mouth daily. 6. Trazodone 50 mg daily. 7. Ziprasidone 40 mg b.i.d.  ALLERGIES:  He is not allergic to any medications.  SOCIAL HISTORY:  Negative for smoking, alcohol, or drug use.  REVIEW  OF SYSTEMS:  Difficult to obtain.  PHYSICAL EXAMINATION:  VITAL SIGNS:  Today blood pressure 131/72, pulse rate 78, temperature 97.6.  HEENT:  Moist mucous membranes.  Negative for JVD or thyromegaly. CHEST:  Clear to auscultation bilaterally. CARDIOVASCULAR:  Normal S1 and S2.  No murmur.  No gallop.  ABDOMEN: Soft and nontender. EXTREMITIES:  No edema. CNS:  Moves his extremities with power of 2-3/5.  No sensory deficit. He has speech difficulty and basically expressive aphasia is noted.  His blood work shows sodium 136, potassium 4.2, chloride 106, bicarb 22, BUN 29, creatinine 1.46, GFR 55.  His A1c is 8.3%.  ASSESSMENT: 1. Type 1 diabetes, chronically uncontrolled with recent A1c of 8.3%. 2. Pneumonia. 3. Urinary tract infection. 4. Mental retardation. 5. Probable sepsis secondary to urinary tract infection and pneumonia.  PLAN:  I will lower his Lantus to 30 units nightly, however, initiate NovoLog 10 units t.i.d. before meals plus sliding scale.  He is known to have labile blood glucose profile even with most strict basal bolus insulin coverage.  Hence he will need close monitoring with Accu-Cheks before meals and at bedtime.  Further insulin adjustment will be based on his blood glucose readings.  Notes that he is on vancomycin and levofloxacin for his infections.  I will continue to  follow him in-house as well as an outpatient for his diabetes care.  Dear Dr. Legrand Rams, thank you for the opportunity to participate in the care of this pleasant patient.          ______________________________ Loni Beckwith, MD     GN/MEDQ  D:  09/21/2011  T:  09/21/2011  Job:  [id number]

## 2011-09-21 NOTE — Progress Notes (Signed)
CRITICAL VALUE ALERT  Critical value received:  Vancomycin Troph 27.7   Date of notification:  09/21/11  Time of notification:  0919  Critical value read back:yes  Nurse who received alert:  Evelene Croon, RN  MD notified (1st page):  Pharmacist notified at [place], [facility]

## 2011-09-21 NOTE — Progress Notes (Signed)
NAME:  [name], [name]           ACCOUNT NO.:  [id number]  MEDICAL RECORD NO.:  [id number]  LOCATION:  [id number]                          FACILITY:  [facility]  PHYSICIAN:  Rielynn Trulson D. Legrand Rams, MD   DATE OF BIRTH:  [birth year]  DATE OF PROCEDURE:  09/21/2011 DATE OF DISCHARGE:                                PROGRESS NOTE   SUBJECTIVE:  The patient feels better.  His fever is subsiding.  OBJECTIVE:  GENERAL:  The patient is more alert, awake, and resting. VITAL SIGNS:  Blood pressure 118/69, pulse 70, respiratory rate 13, temperature 97.9 degrees Fahrenheit. CHEST:  Decreased air entry, few rhonchi. CARDIOVASCULAR SYSTEM: First and second heart sounds heard.  No murmur. No gallop. ABDOMEN:  Soft, and lax.  Bowel sound is positive.  No mass or organomegaly. EXTREMITIES:  No leg edema.  LABS:  Sodium 136, potassium 4.6, chloride 106, carbon dioxide 22, glucose 127, BUN 27, creatinine 1.46, calcium 9.9.  ASSESSMENT: 1. Urinary tract infection. 2. Probably sepsis secondary to the above. 3. Mental retardation. 4. Diabetes mellitus. 5. Probably pneumonia.  PLAN:  To continue the patient on combination IV antibiotics.  Continue Accu-Chek and sliding scale.  Continue supportive care.     Rhyse Skowron D. Legrand Rams, MD     TDF/MEDQ  D:  09/21/2011  T:  09/21/2011  Job:  [id number]

## 2011-09-22 LAB — GLUCOSE, CAPILLARY
Glucose-Capillary: 82 mg/dL (ref 70–99)
Glucose-Capillary: 103 mg/dL — ABNORMAL HIGH (ref 70–99)
Glucose-Capillary: 218 mg/dL — ABNORMAL HIGH (ref 70–99)

## 2011-09-22 MED ORDER — MAGNESIUM CITRATE PO SOLN
1.0000 | Freq: Once | ORAL | Status: AC
  Administered 2011-09-22: 1 via ORAL
  Filled 2011-09-22: qty 296

## 2011-09-22 MED ORDER — LEVOFLOXACIN 500 MG PO TABS
750.0000 mg | ORAL_TABLET | Freq: Every day | ORAL | Status: DC
  Administered 2011-09-23 – 2011-09-24 (×2): 750 mg via ORAL
  Filled 2011-09-22 (×2): qty 1

## 2011-09-22 NOTE — Progress Notes (Signed)
NAMEMOSSES, MCDILL           ACCOUNT NO.:  [id number]  MEDICAL RECORD NO.:  [id number]  LOCATION:  [id number]                          FACILITY:  [facility]  PHYSICIAN:  Loni Beckwith, MD DATE OF BIRTH:  Jan 17, 1961  DATE OF PROCEDURE:  09/22/2011 DATE OF DISCHARGE:                                PROGRESS NOTE   CHIEF COMPLAINT:  Follow up for uncontrolled type 1 diabetes.  SUBJECTIVE:  The patient feels better.  Blood glucose controlled today to near normal levels.  The patient is eating very well.  No hypoglycemia reported.  OBJECTIVE:  GENERAL:  He is alert and awake, in no acute distress. VITAL SIGNS:  Blood pressure is 103/58, pulse rate 95, temperature 97.4, respiratory rate 26. HEENT:  Moist mucous membranes.  No signs of dehydration. NECK:  Negative for JVD. CHEST:  Clear to auscultation. CARDIOVASCULAR:  Distant heart sounds.  Otherwise, no murmur, no gallop. ABDOMEN:  Bowel sounds present.  No tenderness, no rebound. EXTREMITIES:  No edema. CNS:  Motor function 2-3/5 on bilateral lower extremities. SKIN:  No rash.  No hyperemia.  His blood glucose ranges from 103-180.  His chemistry today shows sodium 136, potassium 4.2, chloride 106, bicarb 22, BUN 29, creatinine 1.46.  ASSESSMENT: 1. Type 1 diabetes, chronically uncontrolled, recent A1c 8.3%. 2. Pneumonia. 3. Urinary tract infection. 4. Mental retardation.  PLAN:  We will continue with intensive insulin therapy utilizing Lantus and NovoLog.  Lantus 30 units nightly and NovoLog 8 units t.i.d. a.c. plus moderate dose sliding scale.  We will continue insulin dose titration based on finger stick blood glucose readings.          ______________________________ Loni Beckwith, MD     GN/MEDQ  D:  09/22/2011  T:  09/22/2011  Job:  [id number]

## 2011-09-22 NOTE — Progress Notes (Signed)
Pt found this am to be in extreme pain, sweating profusely and breathing is labored, pt moaning. Pt is unable to communicate his needs but points to rectum and is rubbing his stomach, nods when asked if he needs to have a BM. Suppository given, no impaction felt when placing the suppository, also gave pt pain medication as ordered.  BP has dropped since last night and earlier this am and HR increased. O2 Sats in upper 90's. BG 103.  Notified MD, orders given.

## 2011-09-22 NOTE — Progress Notes (Signed)
PHARMACIST - PHYSICIAN COMMUNICATION DR:   Legrand Rams CONCERNING: Antibiotic IV to Oral Route Change Policy  RECOMMENDATION: This patient is receiving Levaquin by the intravenous route.  Based on criteria approved by the Pharmacy and Therapeutics Committee, the antibiotic(s) is/are being converted to the equivalent oral dose form(s).   DESCRIPTION: These criteria include:  Patient being treated for a respiratory tract infection, urinary tract infection, or cellulitis  The patient is not neutropenic and does not exhibit a GI malabsorption state  The patient is eating (either orally or via tube) and/or has been taking other orally administered medications for a least 24 hours  The patient is improving clinically and has a Tmax < 100.5  If you have questions about this conversion, please contact the Pharmacy Department  [x]   [phone] )  [facility] []   [phone] )  [facility]  []   [phone] )  [facility] []   [phone] )  [facility]

## 2011-09-22 NOTE — Progress Notes (Signed)
NAME:  COURY, SCHOEPP           ACCOUNT NO.:  [id number]  MEDICAL RECORD NO.:  [id number]  LOCATION:  [id number]                          FACILITY:  [facility]  PHYSICIAN:  Mehlani Blankenburg D. Legrand Rams, MD   DATE OF BIRTH:  09-Sep-1960  DATE OF PROCEDURE:  09/22/2011 DATE OF DISCHARGE:                                PROGRESS NOTE   SUBJECTIVE:  The patient feels better.  He is receiving IV antibiotics.  OBJECTIVE:  GENERAL:  The patient is more alert and awake and resting. VITAL SIGNS:  Blood pressure 131/72, pulse 61, respiratory rate 16, temperature 97.6 degrees Fahrenheit. CHEST:  Decreased air entry, few rhonchi. CARDIOVASCULAR:  First and second heart sounds heard.  No murmur.  No gallop. ABDOMEN:  Soft and lax.  Bowel sound is positive.  No mass or organomegaly. EXTREMITIES:  No leg edema.  ASSESSMENT: 1. Urinary tract infection. 2. Diabetes mellitus, poorly controlled. 3. Mental retardation. 4. Pneumonia.  PLAN:  Continue the patient on IV antibiotics.  Continue current insulin therapy according to Endocrine recommendation.  Continue supportive care.     Trica Usery D. Legrand Rams, MD     TDF/MEDQ  D:  09/22/2011  T:  09/22/2011  Job:  [id number]

## 2011-09-22 NOTE — Progress Notes (Signed)
Patient ID: [name], male   DOB: [birth year], 51 y.o.   MRN: [id number]

## 2011-09-23 LAB — GLUCOSE, CAPILLARY
Glucose-Capillary: 31 mg/dL — CL (ref 70–99)
Glucose-Capillary: 347 mg/dL — ABNORMAL HIGH (ref 70–99)

## 2011-09-23 MED ORDER — INSULIN ASPART 100 UNIT/ML ~~LOC~~ SOLN
5.0000 [IU] | Freq: Three times a day (TID) | SUBCUTANEOUS | Status: DC
  Administered 2011-09-24 (×2): 5 [IU] via SUBCUTANEOUS

## 2011-09-23 MED ORDER — INSULIN GLARGINE 100 UNIT/ML ~~LOC~~ SOLN
15.0000 [IU] | Freq: Every day | SUBCUTANEOUS | Status: DC
  Administered 2011-09-23: 15 [IU] via SUBCUTANEOUS

## 2011-09-23 NOTE — Progress Notes (Signed)
Patient blood sugar of 31,orange juice with sugar given. Blood sugar 106 after 15 minutes. Patient sitting up in bed eating breakfast.

## 2011-09-23 NOTE — Progress Notes (Signed)
Patient ID: [name], male   DOB: 1960/09/12, 51 y.o.   MRN: [id number]

## 2011-09-23 NOTE — Progress Notes (Signed)
Patient blood sugar of 31,orange juice and sugar given with blood sugar increase to 99.

## 2011-09-24 LAB — BASIC METABOLIC PANEL
BUN: 26 mg/dL — ABNORMAL HIGH (ref 6–23)
Calcium: 9.7 mg/dL (ref 8.4–10.5)
GFR calc non Af Amer: 70 mL/min — ABNORMAL LOW (ref >90)
Glucose, Bld: 205 mg/dL — ABNORMAL HIGH (ref 70–99)
Sodium: 138 mEq/L (ref 135–145)

## 2011-09-24 LAB — GLUCOSE, CAPILLARY: Glucose-Capillary: 257 mg/dL — ABNORMAL HIGH (ref 70–99)

## 2011-09-24 NOTE — Discharge Summary (Signed)
NAME:  [name], [name]           ACCOUNT NO.:  [id number]  MEDICAL RECORD NO.:  [id number]  LOCATION:  [id number]                          FACILITY:  [facility]  PHYSICIAN:  Meili Kleckley D. Legrand Rams, MD   DATE OF BIRTH:  17-Apr-1961  DATE OF ADMISSION:  09/18/2011 DATE OF DISCHARGE:  01/21/2013LH                              DISCHARGE SUMMARY   DISCHARGE DIAGNOSES: 1. Urinary tract infection. 2. Healthcare-associated pneumonia. 3. Probably sepsis secondary to the above. 4. Diabetes mellitus. 5. Mental retardation. 6. History of benign prostatic hypertrophy.  DISCHARGE MEDICATIONS: 1. Flomax 0.4 mg daily. 2. Accu-Chek with sliding scale coverage with NovoLog insulin. 3. Lantus insulin 40 mg at bedtime. 4. Lisinopril 10 mg daily. 5. Zantac 150 mg 2 tablets daily. 6. Zoloft 50 mg daily. 7. Septra DS 1 tablet p.o. daily. 8. Trazodone 50 mg at bedtime. 9. Geodon 40 mg 2 tablets daily with meals.  DISPOSITION:  The patient will be discharged back to assisted living.  DISCHARGE INSTRUCTIONS:  The patient will be followed in the office in 1- week duration.  He will continue his regular medications.  LABORATORY DATA:  On discharge, sodium 138, potassium 4.2, chloride 106, carbon dioxide 24, glucose 205, BUN 26, creatinine 1.19, calcium 9.7.  HOSPITAL COURSE:  This is a 51 year old male patient with history of multiple medical illnesses including mental retardation who was admitted due to fever chills and change in mental status.  He was found to have healthcare-associated pneumonia as well as urinary tract infection.  His urine culture grew E. coli.  The patient was treated with cefepime and over the hospital stay the patient gradually improved.  He was also seen by endocrinologist for his diabetes.  The patient is back to his baseline.  He will be discharged home and will be followed in 1-week duration in the office.     Tianna Baus D. Legrand Rams, MD     TDF/MEDQ  D:  09/24/2011  T:  09/24/2011   Job:  [id number]

## 2011-09-24 NOTE — Progress Notes (Signed)
CARE MANAGEMENT NOTE 09/24/2011  Patient:  [name], [name]   Account Number:  [id number]  Date Initiated:  09/21/2011  Documentation initiated by:  [name]  Subjective/Objective Assessment:   Pt admitted with UTI. PTA pt was a resident at [facility]. Per [facility], pt did not currently have [facility] services.     Action/Plan:   Pt will dc back to [facility] if possible. Previously, [place] saw this pt at [place] Date:  09/23/2011   Anticipated DC Plan:  ASSISTED LIVING / [place]  CM consult      Choice offered to / List presented to:             Status of service:  Completed, signed off Medicare Important Message given?   (If response is "NO", the following Medicare IM given date fields will be blank) Date Medicare IM given:   Date Additional Medicare IM given:    Discharge Disposition:  REST HOME  Per UR Regulation:    Comments:  09/24/11 Oscar Cooper RN BSN CM  09/21/11 [place] BSN CM If discharged over weekend and need [facility] services, please contact [place].

## 2011-09-24 NOTE — Progress Notes (Signed)
Patient discharged back to  [facility] assisted  Living,caretakers to transport patient.No C/O pain or discomfort noted.Accompanied by staff to an awaiting vehicle.

## 2011-09-24 NOTE — Consult Note (Signed)
ANTIBIOTIC CONSULT NOTE   Pharmacy Consult for Vancomycin Indication: pneumonia Also on Levaquin and Cefepime  No Known Allergies  Patient Measurements: Height: 6' (182.9 cm) Weight: 220 lb 7.4 oz (100 kg) IBW/kg (Calculated) : 77.6   Vital Signs: Temp: 97.4 F (36.3 C) (01/21 0513) Temp src: Oral (01/21 0513) BP: 163/74 mmHg (01/21 0513) Pulse Rate: 73  (01/21 0513) Intake/Output from previous day: 01/20 0701 - 01/21 0700 In: 730 [P.O.:480; I.V.:100; IV Piggyback:150] Out: 2850 [Urine:2850] Intake/Output from this shift:    Labs:  [facility] 09/24/11 0506  WBC --  HGB --  PLT --  LABCREA --  CREATININE 1.19   Estimated Creatinine Clearance: 92 ml/min (by C-G formula based on Cr of 1.19).  Basename 09/21/11 0838  VANCOTROUGH 27.7*  VANCOPEAK --  Jake Michaelis --  GENTTROUGH --  GENTPEAK --  GENTRANDOM --  TOBRATROUGH --  Shelah Lewandowsky --  TOBRARND --  AMIKACINPEAK --  AMIKACINTROU --  AMIKACIN --    Microbiology: Recent Results (from the past 720 hour(s))  URINE CULTURE     Status: Normal   Collection Time   09/18/11 10:43 PM      Component Value Range Status Comment   Specimen Description URINE, CATHETERIZED   Final    Special Requests NONE   Final    Setup Time [id number]   Final    Colony Count >=100,000 COLONIES/ML   Final    Culture     Final    Value: MORGANELLA MORGANII     ESCHERICHIA COLI   Report Status 09/22/2011 FINAL   Final    Organism ID, Bacteria MORGANELLA MORGANII   Final    Organism ID, Bacteria ESCHERICHIA COLI   Final   CULTURE, BLOOD (ROUTINE X 2)     Status: Normal   Collection Time   09/18/11 10:50 PM      Component Value Range Status Comment   Specimen Description RIGHT ANTECUBITAL   Final    Special Requests BOTTLES DRAWN AEROBIC AND ANAEROBIC 5CC   Final    Culture NO GROWTH 5 DAYS   Final    Report Status 09/23/2011 FINAL   Final   CULTURE, BLOOD (ROUTINE X 2)     Status: Normal   Collection Time   09/18/11 10:50 PM   Component Value Range Status Comment   Specimen Description BLOOD RIGHT HAND   Final    Special Requests BOTTLES DRAWN AEROBIC AND ANAEROBIC 5CC   Final    Culture NO GROWTH 5 DAYS   Final    Report Status 09/23/2011 FINAL   Final   MRSA PCR SCREENING     Status: Normal   Collection Time   09/19/11  4:27 AM      Component Value Range Status Comment   MRSA by PCR NEGATIVE  NEGATIVE  Final    Medical History: Past Medical History  Diagnosis Date  . Renal disorder   . Elective mutism   . Autism   . Hyperosmolar (nonketotic) coma   . Hyperglycemia   . Diabetes mellitus   . Hyponatremia   . Mental retardation   . Urinary retention   . BPH (benign prostatic hyperplasia)   . Gastroparesis   . Depression    Medications:  Scheduled:     . ceFEPime (MAXIPIME) IV  1 g Intravenous Q8H  . enoxaparin  40 mg Subcutaneous Q24H  . famotidine  20 mg Oral BID  . feeding supplement  237 mL Oral BID  . feeding supplement  30 mL Oral TID WC  . insulin aspart  0-9 Units Subcutaneous TID WC  . insulin aspart  5 Units Subcutaneous TID WC  . insulin glargine  15 Units Subcutaneous QHS  . levofloxacin  750 mg Oral Daily  . sertraline  150 mg Oral Q0700  . Tamsulosin HCl  0.4 mg Oral Q0700  . traZODone  50 mg Oral QHS  . vancomycin  1,500 mg Intravenous Q24H  . ziprasidone  40 mg Oral BID WC  . DISCONTD: insulin aspart  8 Units Subcutaneous TID WC  . DISCONTD: insulin glargine  30 Units Subcutaneous QHS   Assessment: Renal fxn stable Vancomycin clearance not as good as predicted  Goal of Therapy:  Vancomycin trough level 15-20 mcg/ml  Plan: Continue current Levaquin and Cefepime Rx Vancomycin to 1500mg  iv q24hrs F/U trough tomorrow before dose Labs per protocol  Hart Robinsons A 09/24/2011,7:50 AM

## 2011-09-24 NOTE — Progress Notes (Signed)
NAME:  [name], [name]           ACCOUNT NO.:  [id number]  MEDICAL RECORD NO.:  [id number]  LOCATION:                                 FACILITY:  PHYSICIAN:  Loni Beckwith, MD DATE OF BIRTH:  06-08-1961  DATE OF PROCEDURE:  09/23/2011 DATE OF DISCHARGE:                                PROGRESS NOTE   CHIEF COMPLAINT:  Followup for uncontrolled type 1 diabetes.  SUBJECTIVE:  The patient eats very well, however, had 2 episodes of hypoglycemia today in the range of 30s.  The patient is asymptomatic even with those hypoglycemic episodes.  OBJECTIVE:  GENERAL:  He is alert and awake, in no acute distress. VITAL SIGNS:  Blood pressure of 106/55, pulse rate 72, temperature 97.4. HEENT:  Moist mucous membranes. CHEST:  Clear to auscultation. CARDIOVASCULAR:  Normal S1, S2.  No murmur, no gallop. ABDOMEN:  Soft and nontender. EXTREMITIES:  No edema.  His glucose reading was 31 this morning and then after treatment with orange juice and dextrose, rises to 278 before lunch, for which he received 13 units of NovoLog which breaks down blood sugar 36 mg/dL.  He is said to be eating very well.  ASSESSMENT: 1. Type 1 diabetes, chronically uncontrolled with labile blood glucose     profile in hospital.  Recent A1c is 8.3%. 2. Pneumonia. 3. Urinary tract infection. 4. Mental retardation.  PLAN:  I will decrease his Lantus to 15 units at bedtime and NovoLog to 5 units t.i.d. before meals only if he eats his regular meals.  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  Hence, we would be cautious to not to over-tighten his glycemic control.  He will need more frequent monitoring and the timely administration of his meals and his insulin. In this particular patient, we can give his prandial insulin after we determine how much he can eat.     ______________________________ Loni Beckwith, MD     GN/MEDQ  D:  09/23/2011  T:  09/23/2011  Job:  [id number]

## 2011-09-24 NOTE — Progress Notes (Signed)
Pt d/c today by MD. [name] is aware and agreeable and will transport pt. [name], RN, reviewed [facility] for accuracy. D/C summary and FL2 faxed to facility.   [name]

## 2011-10-06 ENCOUNTER — Encounter (HOSPITAL_COMMUNITY): Payer: Self-pay | Admitting: *Deleted

## 2011-10-06 ENCOUNTER — Other Ambulatory Visit: Payer: Self-pay

## 2011-10-06 ENCOUNTER — Emergency Department (HOSPITAL_COMMUNITY)
Admission: EM | Admit: 2011-10-06 | Discharge: 2011-10-06 | Disposition: A | Discharge status: Other Acute Inpatient Hospital | Payer: Medicaid Other | Attending: Emergency Medicine | Admitting: Emergency Medicine

## 2011-10-06 DIAGNOSIS — F329 Major depressive disorder, single episode, unspecified: Secondary | ICD-10-CM | POA: Insufficient documentation

## 2011-10-06 DIAGNOSIS — E162 Hypoglycemia, unspecified: Secondary | ICD-10-CM

## 2011-10-06 DIAGNOSIS — F79 Unspecified intellectual disabilities: Secondary | ICD-10-CM | POA: Insufficient documentation

## 2011-10-06 DIAGNOSIS — Z794 Long term (current) use of insulin: Secondary | ICD-10-CM | POA: Insufficient documentation

## 2011-10-06 DIAGNOSIS — E860 Dehydration: Secondary | ICD-10-CM

## 2011-10-06 DIAGNOSIS — Z79899 Other long term (current) drug therapy: Secondary | ICD-10-CM | POA: Insufficient documentation

## 2011-10-06 DIAGNOSIS — F3289 Other specified depressive episodes: Secondary | ICD-10-CM | POA: Insufficient documentation

## 2011-10-06 DIAGNOSIS — E1169 Type 2 diabetes mellitus with other specified complication: Secondary | ICD-10-CM | POA: Insufficient documentation

## 2011-10-06 DIAGNOSIS — N400 Enlarged prostate without lower urinary tract symptoms: Secondary | ICD-10-CM | POA: Insufficient documentation

## 2011-10-06 DIAGNOSIS — F4329 Adjustment disorder with other symptoms: Secondary | ICD-10-CM | POA: Insufficient documentation

## 2011-10-06 DIAGNOSIS — F840 Autistic disorder: Secondary | ICD-10-CM | POA: Insufficient documentation

## 2011-10-06 DIAGNOSIS — N289 Disorder of kidney and ureter, unspecified: Secondary | ICD-10-CM | POA: Insufficient documentation

## 2011-10-06 LAB — GLUCOSE, CAPILLARY
Glucose-Capillary: 19 mg/dL — CL (ref 70–99)
Glucose-Capillary: 141 mg/dL — ABNORMAL HIGH (ref 70–99)

## 2011-10-06 LAB — URINALYSIS, ROUTINE W REFLEX MICROSCOPIC
Bilirubin Urine: NEGATIVE
Nitrite: NEGATIVE
Urobilinogen, UA: 0.2 mg/dL (ref 0.0–1.0)
pH: 6 (ref 5.0–8.0)

## 2011-10-06 LAB — BASIC METABOLIC PANEL
BUN: 43 mg/dL — ABNORMAL HIGH (ref 6–23)
Creatinine, Ser: 1.88 mg/dL — ABNORMAL HIGH (ref 0.50–1.35)
GFR calc Af Amer: 47 mL/min — ABNORMAL LOW (ref >90)
GFR calc non Af Amer: 40 mL/min — ABNORMAL LOW (ref >90)

## 2011-10-06 LAB — URINE MICROSCOPIC-ADD ON

## 2011-10-06 MED ORDER — DEXTROSE 50 % IV SOLN
INTRAVENOUS | Status: AC
  Administered 2011-10-06: 50 mL via INTRAVENOUS
  Filled 2011-10-06: qty 50

## 2011-10-06 MED ORDER — SODIUM CHLORIDE 0.9 % IV BOLUS (SEPSIS)
1000.0000 mL | Freq: Once | INTRAVENOUS | Status: AC
  Administered 2011-10-06: 1000 mL via INTRAVENOUS

## 2011-10-06 MED ORDER — DEXTROSE 50 % IV SOLN
50.0000 mL | Freq: Once | INTRAVENOUS | Status: AC
  Administered 2011-10-06: 50 mL via INTRAVENOUS

## 2011-10-06 NOTE — ED Notes (Signed)
Discussed pt's d/c instructions with caregiver of pt - [name], Med Tech - caregiver denies any further questions or concerns at present and states staff is on their way to pick pt up.

## 2011-10-06 NOTE — ED Notes (Signed)
Bladder scanned pt - no urine scanned at this time - foley catheter currently draining to gravity, yellow clear urine noted approx 244ml UOP.

## 2011-10-06 NOTE — ED Notes (Signed)
No urine output today per staff of [facility].

## 2011-10-06 NOTE — ED Notes (Signed)
Old foley catheter removed without difficulty and catheter intact

## 2011-10-06 NOTE — ED Notes (Signed)
Pt noted to be very diaphoretic - arousable by voice - CBg obtained and reads 19 -Evalee [place], PA notified - 1 Amp D50 given - pt also able to eat peanut butter crackers and given orange juice. CBG recheck @ 2036 reads 132 - will continue to monitor. Pt placed on cardiac monitor, EKG obtained and VS WNL.

## 2011-10-09 LAB — URINE CULTURE: Colony Count: 75000

## 2011-10-10 ENCOUNTER — Emergency Department (HOSPITAL_COMMUNITY)
Admission: EM | Admit: 2011-10-10 | Discharge: 2011-10-10 | Disposition: A | Discharge status: Skilled Nursing Facility | Payer: Medicaid Other | Attending: Emergency Medicine | Admitting: Emergency Medicine

## 2011-10-10 ENCOUNTER — Encounter (HOSPITAL_COMMUNITY): Payer: Self-pay | Admitting: *Deleted

## 2011-10-10 DIAGNOSIS — F840 Autistic disorder: Secondary | ICD-10-CM | POA: Insufficient documentation

## 2011-10-10 DIAGNOSIS — F79 Unspecified intellectual disabilities: Secondary | ICD-10-CM | POA: Insufficient documentation

## 2011-10-10 DIAGNOSIS — R142 Eructation: Secondary | ICD-10-CM | POA: Insufficient documentation

## 2011-10-10 DIAGNOSIS — Z794 Long term (current) use of insulin: Secondary | ICD-10-CM | POA: Insufficient documentation

## 2011-10-10 DIAGNOSIS — N489 Disorder of penis, unspecified: Secondary | ICD-10-CM | POA: Insufficient documentation

## 2011-10-10 DIAGNOSIS — R109 Unspecified abdominal pain: Secondary | ICD-10-CM | POA: Insufficient documentation

## 2011-10-10 DIAGNOSIS — R143 Flatulence: Secondary | ICD-10-CM | POA: Insufficient documentation

## 2011-10-10 DIAGNOSIS — R10819 Abdominal tenderness, unspecified site: Secondary | ICD-10-CM | POA: Insufficient documentation

## 2011-10-10 DIAGNOSIS — B3749 Other urogenital candidiasis: Secondary | ICD-10-CM | POA: Insufficient documentation

## 2011-10-10 DIAGNOSIS — R141 Gas pain: Secondary | ICD-10-CM | POA: Insufficient documentation

## 2011-10-10 DIAGNOSIS — Z79899 Other long term (current) drug therapy: Secondary | ICD-10-CM | POA: Insufficient documentation

## 2011-10-10 DIAGNOSIS — F329 Major depressive disorder, single episode, unspecified: Secondary | ICD-10-CM | POA: Insufficient documentation

## 2011-10-10 DIAGNOSIS — R338 Other retention of urine: Secondary | ICD-10-CM

## 2011-10-10 DIAGNOSIS — E119 Type 2 diabetes mellitus without complications: Secondary | ICD-10-CM | POA: Insufficient documentation

## 2011-10-10 DIAGNOSIS — F3289 Other specified depressive episodes: Secondary | ICD-10-CM | POA: Insufficient documentation

## 2011-10-10 LAB — URINALYSIS, ROUTINE W REFLEX MICROSCOPIC
Glucose, UA: 250 mg/dL — AB
Ketones, ur: NEGATIVE mg/dL
Nitrite: NEGATIVE
Protein, ur: NEGATIVE mg/dL
Urobilinogen, UA: 0.2 mg/dL (ref 0.0–1.0)

## 2011-10-10 LAB — URINE MICROSCOPIC-ADD ON

## 2011-10-10 MED ORDER — FLUCONAZOLE 100 MG PO TABS
100.0000 mg | ORAL_TABLET | Freq: Once | ORAL | Status: AC
  Administered 2011-10-10: 100 mg via ORAL
  Filled 2011-10-10: qty 1

## 2011-10-10 MED ORDER — FLUCONAZOLE 100 MG PO TABS: 100.0000 mg | ORAL_TABLET | Freq: Every day | ORAL | Status: AC

## 2011-10-10 MED ORDER — FLUCONAZOLE 200 MG PO TABS: 100.0000 mg | ORAL_TABLET | Freq: Every day | ORAL | Status: DC

## 2011-10-10 NOTE — ED Notes (Signed)
Patient is in pain at this time. RN Marya Amsler aware.

## 2011-10-10 NOTE — ED Notes (Signed)
Pt from [facility]. Brought in due to pain at catheter site.

## 2011-10-10 NOTE — ED Notes (Signed)
MD at bedside to reassess pt

## 2011-10-10 NOTE — ED Notes (Signed)
Patient is comfortable at this time.

## 2011-10-10 NOTE — ED Notes (Signed)
Pt stated he feels a lot of relief

## 2011-10-10 NOTE — ED Provider Notes (Signed)
History     CSN: [id number]  Arrival date & time 10/10/11  1038   First MD Initiated Contact with Patient 10/10/11 1344      Chief Complaint  Patient presents with  . Penis Pain   Level V caveat for speech impediment (mutism)  (Consider location/radiation/quality/duration/timing/severity/associated sxs/prior treatment) HPI  She indicates he started having abdominal pain today and points to suprapubic region. Patient has a Foley catheter inserted. He indicates the catheter stopped draining this morning after they emptied his bag. He denies nausea or vomiting  ECP Dr. Legrand Rams   Past Medical History  Diagnosis Date  . Renal disorder   . Elective mutism   . Autism   . Hyperosmolar (nonketotic) coma   . Hyperglycemia   . Diabetes mellitus   . Hyponatremia   . Mental retardation   . Urinary retention   . BPH (benign prostatic hyperplasia)   . Gastroparesis   . Depression     History reviewed. No pertinent past surgical history.  History reviewed. No pertinent family history.  History  Substance Use Topics  . Smoking status: Never Smoker   . Smokeless tobacco: Not on file  . Alcohol Use: No  Lives in nursing home    Review of Systems  Unable to perform ROS: Other    Allergies  Review of patient's allergies indicates no known allergies.  Home Medications   Current Outpatient Rx  Name Route Sig Dispense Refill  . INSULIN ASPART 100 UNIT/ML [place] SOLN Subcutaneous Inject 15 Units into the skin 3 (three) times daily before meals. Per sliding scale instructions: If 150-200= 1 unit, 201-250= 2 units, 251-300= 3 units, 301-350= 4 units, 351-400= 5 units, Greater than 400= 6 units    . INSULIN GLARGINE 100 UNIT/ML [place] SOLN Subcutaneous Inject 40 Units into the skin at bedtime.     Marland Kitchen LISINOPRIL 10 MG PO TABS Oral Take 10 mg by mouth every morning.     Marland Kitchen RANITIDINE HCL 150 MG PO CAPS Oral Take 150 mg by mouth 2 (two) times daily.      . SERTRALINE HCL 50 MG PO TABS Oral  Take 150 mg by mouth every morning.     . SULFAMETHOXAZOLE-TMP DS 800-160 MG PO TABS Oral Take 1 tablet by mouth every evening.     Marland Kitchen TAMSULOSIN HCL 0.4 MG PO CAPS Oral Take 0.4 mg by mouth every morning.     . TRAZODONE HCL 50 MG PO TABS Oral Take 50 mg by mouth at bedtime.      Marland Kitchen ZIPRASIDONE HCL 40 MG PO CAPS Oral Take 40 mg by mouth 2 (two) times daily with a meal.        BP 150/79  Pulse 73  Temp(Src) 97.2 F (36.2 C) (Oral)  Resp 18  SpO2 100%  Vital signs normal    Physical Exam  Nursing note and vitals reviewed. Constitutional: He appears well-developed and well-nourished.  Non-toxic appearance. He does not appear ill. He appears distressed.  HENT:  Head: Normocephalic and atraumatic.  Right Ear: External ear normal.  Left Ear: External ear normal.  Nose: Nose normal. No mucosal edema or rhinorrhea.  Mouth/Throat: Mucous membranes are normal. No dental abscesses or uvula swelling.  Eyes: Conjunctivae and EOM are normal. Pupils are equal, round, and reactive to light.  Neck: Normal range of motion and full passive range of motion without pain. Neck supple.  Cardiovascular: Normal rate, regular rhythm and normal heart sounds.  Exam reveals no gallop and  no friction rub.   No murmur heard. Pulmonary/Chest: Effort normal and breath sounds normal. No respiratory distress. He has no wheezes. He has no rhonchi. He has no rales. He exhibits no tenderness and no crepitus.  Abdominal: Normal appearance and bowel sounds are normal. He exhibits distension. There is tenderness. There is no rebound and no guarding.       Patient's very tender and distended in the suprapubic region.  Musculoskeletal: Normal range of motion. He exhibits no edema and no tenderness.       Moves all extremities well.   Neurological: He is alert. He has normal strength. No cranial nerve deficit.  Skin: Skin is warm, dry and intact. No rash noted. No erythema. No pallor.  Psychiatric: He has a normal mood and  affect. His speech is normal and behavior is normal. His mood appears not anxious.       Cooperative but does not speak    ED Course  Procedures (including critical care time)  Bladder scan done showing almost 500 cc urine in the bladder. The nurse irrigate his catheter after which he had good return and patient's pain went away. He had 700 cc of urine in the bag when I rechecked him.  Review of his chart shows he was admitted January 15 for unresponsiveness and hypotension felt to have a UTI and possible urosepsis. He also had a urine culture done on February 5 they grew out 75,000 yeast.  1756 discuss with Dr. Luan Pulling who is on-call for Dr. Legrand Rams he recommends treating with Diflucan 100 mg a day for 10 days.   Results for orders placed during the hospital encounter of 10/10/11  URINALYSIS, ROUTINE W REFLEX MICROSCOPIC      Component Value Range   Color, Urine YELLOW  YELLOW    APPearance CLOUDY (*) CLEAR    Specific Gravity, Urine 1.025  1.005 - 1.030    pH 5.5  5.0 - 8.0    Glucose, UA 250 (*) NEGATIVE (mg/dL)   Hgb urine dipstick SMALL (*) NEGATIVE    Bilirubin Urine NEGATIVE  NEGATIVE    Ketones, ur NEGATIVE  NEGATIVE (mg/dL)   Protein, ur NEGATIVE  NEGATIVE (mg/dL)   Urobilinogen, UA 0.2  0.0 - 1.0 (mg/dL)   Nitrite NEGATIVE  NEGATIVE    Leukocytes, UA MODERATE (*) NEGATIVE   URINE MICROSCOPIC-ADD ON      Component Value Range   Squamous Epithelial / LPF RARE  RARE    WBC, UA TOO NUMEROUS TO COUNT  <3 (WBC/hpf)   RBC / HPF 7-10  <3 (RBC/hpf)   Bacteria, UA FEW (*) RARE    Urine-Other MANY YEAST     Laboratory interpretation all normal except UTI      1. Acute urinary retention   2. Yeast UTI    New Prescriptions   FLUCONAZOLE (DIFLUCAN) 100 MG TABLET    Take 1 tablet (100 mg total) by mouth daily.   Plan discharge Rolland Porter, MD, [place], MD 10/10/11 [phone]

## 2011-10-11 NOTE — ED Provider Notes (Signed)
History     CSN: [id number]  Arrival date & time 10/06/11  1708   First MD Initiated Contact with Patient 10/06/11 1733      Chief Complaint  Patient presents with  . Urinary Retention    (Consider location/radiation/quality/duration/timing/severity/associated sxs/prior treatment) HPI Comments: Patient is transported here from his nursing facility secondary to decreased urinary output from his foley catheter today.  He has some pelvic discomfort.  Level 5 caveat applied as patient has "elective mutism",  Yet can respond to yes and no questions.  He does have some surprapubic discomfort.  He has had no fevers, no nausea or vomiting.  It is unclear why he has a foley catheter,  But it is chronic.  He does not know when the foley was changed last.  His symptoms have been present today only.  The history is provided by the patient and the nursing home.    Past Medical History  Diagnosis Date  . Renal disorder   . Elective mutism   . Autism   . Hyperosmolar (nonketotic) coma   . Hyperglycemia   . Diabetes mellitus   . Hyponatremia   . Mental retardation   . Urinary retention   . BPH (benign prostatic hyperplasia)   . Gastroparesis   . Depression     History reviewed. No pertinent past surgical history.  No family history on file.  History  Substance Use Topics  . Smoking status: Never Smoker   . Smokeless tobacco: Not on file  . Alcohol Use: No      Review of Systems  Constitutional: Negative for fever.  HENT: Negative for congestion, sore throat and neck pain.   Eyes: Negative.   Respiratory: Negative for chest tightness and shortness of breath.   Cardiovascular: Negative for chest pain.  Gastrointestinal: Negative for nausea and abdominal pain.  Genitourinary: Positive for decreased urine volume.  Musculoskeletal: Negative for joint swelling and arthralgias.  Skin: Negative.  Negative for rash and wound.  Neurological: Negative for dizziness, weakness,  light-headedness, numbness and headaches.  Hematological: Negative.   Psychiatric/Behavioral: Negative.     Allergies  Review of patient's allergies indicates no known allergies.  Home Medications   Current Outpatient Rx  Name Route Sig Dispense Refill  . FLUCONAZOLE 100 MG PO TABS Oral Take 1 tablet (100 mg total) by mouth daily. 10 tablet 0  . INSULIN ASPART 100 UNIT/ML [place] SOLN Subcutaneous Inject 15 Units into the skin 3 (three) times daily before meals. Per sliding scale instructions: If 150-200= 1 unit, 201-250= 2 units, 251-300= 3 units, 301-350= 4 units, 351-400= 5 units, Greater than 400= 6 units    . INSULIN GLARGINE 100 UNIT/ML [place] SOLN Subcutaneous Inject 40 Units into the skin at bedtime.     Marland Kitchen LISINOPRIL 10 MG PO TABS Oral Take 10 mg by mouth every morning.     Marland Kitchen RANITIDINE HCL 150 MG PO CAPS Oral Take 150 mg by mouth 2 (two) times daily.      . SERTRALINE HCL 50 MG PO TABS Oral Take 150 mg by mouth every morning.     . SULFAMETHOXAZOLE-TMP DS 800-160 MG PO TABS Oral Take 1 tablet by mouth every evening.     Marland Kitchen TAMSULOSIN HCL 0.4 MG PO CAPS Oral Take 0.4 mg by mouth every morning.     . TRAZODONE HCL 50 MG PO TABS Oral Take 50 mg by mouth at bedtime.      Marland Kitchen ZIPRASIDONE HCL 40 MG PO CAPS Oral  Take 40 mg by mouth 2 (two) times daily with a meal.        BP 139/66  Pulse 76  Temp(Src) 98.7 F (37.1 C) (Oral)  Resp 15  Ht 6' (1.829 m)  Wt 200 lb (90.719 kg)  BMI 27.12 kg/m2  SpO2 98%  Physical Exam  Nursing note and vitals reviewed. Constitutional: He is oriented to person, place, and time. He appears well-developed and well-nourished.  HENT:  Head: Normocephalic and atraumatic.  Eyes: Conjunctivae are normal.  Neck: Normal range of motion.  Cardiovascular: Normal rate, regular rhythm, normal heart sounds and intact distal pulses.   Pulmonary/Chest: Effort normal and breath sounds normal. He has no wheezes.  Abdominal: Soft. Bowel sounds are normal. There is no  tenderness.  Musculoskeletal: Normal range of motion.  Neurological: He is alert and oriented to person, place, and time.  Skin: Skin is warm and dry.  Psychiatric: He has a normal mood and affect.    ED Course  Procedures (including critical care time)  Results for orders placed during the hospital encounter of [id number]  BASIC METABOLIC PANEL      Component Value Range   Sodium 133 (*) 135 - 145 (mEq/L)   Potassium 5.4 (*) 3.5 - 5.1 (mEq/L)   Chloride 100  96 - 112 (mEq/L)   CO2 21  19 - 32 (mEq/L)   Glucose, Bld 256 (*) 70 - 99 (mg/dL)   BUN 43 (*) 6 - 23 (mg/dL)   Creatinine, Ser 1.88 (*) 0.50 - 1.35 (mg/dL)   Calcium 10.4  8.4 - 10.5 (mg/dL)   GFR calc non Af Amer 40 (*) >90 (mL/min)   GFR calc Af Amer 47 (*) >90 (mL/min)  URINALYSIS, ROUTINE W REFLEX MICROSCOPIC      Component Value Range   Color, Urine YELLOW  YELLOW    APPearance CLEAR  CLEAR    Specific Gravity, Urine 1.015  1.005 - 1.030    pH 6.0  5.0 - 8.0    Glucose, UA >1000 (*) NEGATIVE (mg/dL)   Hgb urine dipstick TRACE (*) NEGATIVE    Bilirubin Urine NEGATIVE  NEGATIVE    Ketones, ur TRACE (*) NEGATIVE (mg/dL)   Protein, ur NEGATIVE  NEGATIVE (mg/dL)   Urobilinogen, UA 0.2  0.0 - 1.0 (mg/dL)   Nitrite NEGATIVE  NEGATIVE    Leukocytes, UA NEGATIVE  NEGATIVE   POTASSIUM      Component Value Range   Potassium 4.6  3.5 - 5.1 (mEq/L)  URINE MICROSCOPIC-ADD ON      Component Value Range   WBC, UA 11-20  <3 (WBC/hpf)   RBC / HPF 7-10  <3 (RBC/hpf)   Bacteria, UA FEW (*) RARE    Urine-Other YEAST    GLUCOSE, CAPILLARY      Component Value Range   Glucose-Capillary 19 (*) 70 - 99 (mg/dL)   Comment 1 Documented in Chart     Comment 2 Call MD NNP PA CNM    GLUCOSE, CAPILLARY      Component Value Range   Glucose-Capillary 132 (*) 70 - 99 (mg/dL)  GLUCOSE, CAPILLARY      Component Value Range   Glucose-Capillary 141 (*) 70 - 99 (mg/dL)  URINE CULTURE      Component Value Range   Specimen Description  URINE, CATHETERIZED     Special Requests Normal     Culture  Setup Time [id number]     Colony Count 75,000 COLONIES/ML     Culture YEAST  Report Status 10/09/2011 FINAL        1. Dehydration   2. Renal insufficiency   3. Hypoglycemia     Urine culture sent.  Patients foley replaced,  Normal urine volumes produced during ed visit,  With normal specific gravity and bladder scanner revealed no significant urine residual in bladder.  During ed visit,  Patient became hypoglycemic,  Which per chart review does occur frequently.  Given 1 amp D50,  Also ate crackers,  Peanut butter,  Drank orange juice.  Pt stable at time of discharge.  Advised nursing home staff to check cbg at least once overnight tonight.   [place], [place] 10/11/11 2159

## 2011-10-12 LAB — URINE CULTURE

## 2011-10-12 NOTE — ED Provider Notes (Signed)
Medical screening examination/treatment/procedure(s) were performed by non-physician practitioner and as supervising physician I was immediately available for consultation/collaboration.  Gypsy Balsam. Olin Hauser, MD 10/12/11 1510

## 2011-10-13 NOTE — ED Notes (Signed)
Treated per protocol MD

## 2011-10-22 ENCOUNTER — Inpatient Hospital Stay (HOSPITAL_COMMUNITY)
Admission: EM | Admit: 2011-10-22 | Discharge: 2011-10-26 | DRG: 871 | Disposition: A | Discharge status: Home Health | Payer: Medicaid Other | Attending: Internal Medicine | Admitting: Internal Medicine

## 2011-10-22 ENCOUNTER — Encounter (HOSPITAL_COMMUNITY): Payer: Self-pay | Admitting: Emergency Medicine

## 2011-10-22 ENCOUNTER — Emergency Department (HOSPITAL_COMMUNITY): Payer: Medicaid Other

## 2011-10-22 ENCOUNTER — Other Ambulatory Visit: Payer: Self-pay

## 2011-10-22 DIAGNOSIS — N289 Disorder of kidney and ureter, unspecified: Secondary | ICD-10-CM | POA: Diagnosis present

## 2011-10-22 DIAGNOSIS — N179 Acute kidney failure, unspecified: Secondary | ICD-10-CM | POA: Diagnosis present

## 2011-10-22 DIAGNOSIS — F329 Major depressive disorder, single episode, unspecified: Secondary | ICD-10-CM | POA: Diagnosis present

## 2011-10-22 DIAGNOSIS — Z794 Long term (current) use of insulin: Secondary | ICD-10-CM

## 2011-10-22 DIAGNOSIS — J189 Pneumonia, unspecified organism: Secondary | ICD-10-CM | POA: Diagnosis present

## 2011-10-22 DIAGNOSIS — E871 Hypo-osmolality and hyponatremia: Secondary | ICD-10-CM | POA: Diagnosis present

## 2011-10-22 DIAGNOSIS — A419 Sepsis, unspecified organism: Secondary | ICD-10-CM

## 2011-10-22 DIAGNOSIS — E119 Type 2 diabetes mellitus without complications: Secondary | ICD-10-CM | POA: Diagnosis present

## 2011-10-22 DIAGNOSIS — N400 Enlarged prostate without lower urinary tract symptoms: Secondary | ICD-10-CM | POA: Diagnosis present

## 2011-10-22 DIAGNOSIS — D649 Anemia, unspecified: Secondary | ICD-10-CM | POA: Diagnosis present

## 2011-10-22 DIAGNOSIS — F79 Unspecified intellectual disabilities: Secondary | ICD-10-CM | POA: Diagnosis present

## 2011-10-22 DIAGNOSIS — E872 Acidosis, unspecified: Secondary | ICD-10-CM | POA: Diagnosis present

## 2011-10-22 DIAGNOSIS — F840 Autistic disorder: Secondary | ICD-10-CM | POA: Diagnosis present

## 2011-10-22 DIAGNOSIS — R739 Hyperglycemia, unspecified: Secondary | ICD-10-CM | POA: Diagnosis present

## 2011-10-22 DIAGNOSIS — F3289 Other specified depressive episodes: Secondary | ICD-10-CM | POA: Diagnosis present

## 2011-10-22 DIAGNOSIS — N189 Chronic kidney disease, unspecified: Secondary | ICD-10-CM | POA: Diagnosis present

## 2011-10-22 DIAGNOSIS — R8290 Unspecified abnormal findings in urine: Secondary | ICD-10-CM | POA: Diagnosis present

## 2011-10-22 DIAGNOSIS — Z23 Encounter for immunization: Secondary | ICD-10-CM

## 2011-10-22 DIAGNOSIS — R112 Nausea with vomiting, unspecified: Secondary | ICD-10-CM | POA: Diagnosis present

## 2011-10-22 DIAGNOSIS — K5900 Constipation, unspecified: Secondary | ICD-10-CM | POA: Diagnosis present

## 2011-10-22 DIAGNOSIS — R6510 Systemic inflammatory response syndrome (SIRS) of non-infectious origin without acute organ dysfunction: Secondary | ICD-10-CM | POA: Diagnosis present

## 2011-10-22 DIAGNOSIS — N19 Unspecified kidney failure: Secondary | ICD-10-CM

## 2011-10-22 DIAGNOSIS — E875 Hyperkalemia: Secondary | ICD-10-CM | POA: Diagnosis present

## 2011-10-22 DIAGNOSIS — Z978 Presence of other specified devices: Secondary | ICD-10-CM

## 2011-10-22 LAB — INFLUENZA PANEL BY PCR (TYPE A & B): Influenza B By PCR: NEGATIVE

## 2011-10-22 LAB — DIFFERENTIAL
Basophils Absolute: 0 10*3/uL (ref 0.0–0.1)
Basophils Relative: 0 % (ref 0–1)
Eosinophils Absolute: 0.1 10*3/uL (ref 0.0–0.7)
Monocytes Relative: 3 % (ref 3–12)
Neutro Abs: 9.1 10*3/uL — ABNORMAL HIGH (ref 1.7–7.7)
Neutrophils Relative %: 91 % — ABNORMAL HIGH (ref 43–77)

## 2011-10-22 LAB — BASIC METABOLIC PANEL
Calcium: 8.9 mg/dL (ref 8.4–10.5)
Creatinine, Ser: 1.82 mg/dL — ABNORMAL HIGH (ref 0.50–1.35)
GFR calc non Af Amer: 42 mL/min — ABNORMAL LOW (ref >90)
Glucose, Bld: 317 mg/dL — ABNORMAL HIGH (ref 70–99)
Sodium: 134 mEq/L — ABNORMAL LOW (ref 135–145)

## 2011-10-22 LAB — URINALYSIS, ROUTINE W REFLEX MICROSCOPIC
Bilirubin Urine: NEGATIVE
Glucose, UA: >1000 mg/dL — AB
Leukocytes, UA: NEGATIVE
Nitrite: NEGATIVE
Specific Gravity, Urine: 1.015 (ref 1.005–1.030)
pH: 6 (ref 5.0–8.0)

## 2011-10-22 LAB — COMPREHENSIVE METABOLIC PANEL
AST: 46 U/L — ABNORMAL HIGH (ref 0–37)
Albumin: 4 g/dL (ref 3.5–5.2)
Alkaline Phosphatase: 83 U/L (ref 39–117)
BUN: 53 mg/dL — ABNORMAL HIGH (ref 6–23)
Potassium: 5.8 mEq/L — ABNORMAL HIGH (ref 3.5–5.1)
Total Protein: 8 g/dL (ref 6.0–8.3)

## 2011-10-22 LAB — GLUCOSE, CAPILLARY
Glucose-Capillary: 329 mg/dL — ABNORMAL HIGH (ref 70–99)
Glucose-Capillary: 216 mg/dL — ABNORMAL HIGH (ref 70–99)
Glucose-Capillary: 149 mg/dL — ABNORMAL HIGH (ref 70–99)
Glucose-Capillary: 304 mg/dL — ABNORMAL HIGH (ref 70–99)

## 2011-10-22 LAB — CBC
Hemoglobin: 10.5 g/dL — ABNORMAL LOW (ref 13.0–17.0)
MCH: 26.9 pg (ref 26.0–34.0)
MCHC: 33 g/dL (ref 30.0–36.0)
Platelets: 197 10*3/uL (ref 150–400)
RDW: 13.8 % (ref 11.5–15.5)

## 2011-10-22 LAB — MRSA PCR SCREENING: MRSA by PCR: NEGATIVE

## 2011-10-22 LAB — LIPASE, BLOOD: Lipase: 13 U/L (ref 11–59)

## 2011-10-22 LAB — URINE MICROSCOPIC-ADD ON

## 2011-10-22 MED ORDER — INSULIN ASPART 100 UNIT/ML ~~LOC~~ SOLN
0.0000 [IU] | Freq: Three times a day (TID) | SUBCUTANEOUS | Status: DC
Start: 2011-10-22 — End: 2011-10-26
  Administered 2011-10-22: 2 [IU] via SUBCUTANEOUS
  Administered 2011-10-22: 11 [IU] via SUBCUTANEOUS
  Administered 2011-10-23: 3 [IU] via SUBCUTANEOUS
  Administered 2011-10-23: 13:00:00 via SUBCUTANEOUS
  Administered 2011-10-24: 8 [IU] via SUBCUTANEOUS
  Administered 2011-10-24: 3 [IU] via SUBCUTANEOUS
  Administered 2011-10-25 (×3): 8 [IU] via SUBCUTANEOUS
  Administered 2011-10-26: 3 [IU] via SUBCUTANEOUS
  Administered 2011-10-26: 8 [IU] via SUBCUTANEOUS
  Filled 2011-10-22: qty 3

## 2011-10-22 MED ORDER — ACETAMINOPHEN 325 MG PO TABS
650.0000 mg | ORAL_TABLET | Freq: Four times a day (QID) | ORAL | Status: DC | PRN
  Administered 2011-10-22 – 2011-10-23 (×4): 650 mg via ORAL
  Filled 2011-10-22 (×4): qty 2

## 2011-10-22 MED ORDER — SODIUM POLYSTYRENE SULFONATE 15 GM/60ML PO SUSP
60.0000 g | Freq: Once | ORAL | Status: AC
  Administered 2011-10-22: 60 g via ORAL
  Filled 2011-10-22: qty 240

## 2011-10-22 MED ORDER — VANCOMYCIN HCL 1000 MG IV SOLR
1250.0000 mg | INTRAVENOUS | Status: DC
  Administered 2011-10-23 – 2011-10-25 (×2): 1250 mg via INTRAVENOUS
  Filled 2011-10-22 (×4): qty 1250

## 2011-10-22 MED ORDER — PIPERACILLIN-TAZOBACTAM 3.375 G IVPB
3.3750 g | Freq: Once | INTRAVENOUS | Status: AC
  Administered 2011-10-22: 3.375 g via INTRAVENOUS
  Filled 2011-10-22: qty 50

## 2011-10-22 MED ORDER — SODIUM POLYSTYRENE SULFONATE 15 GM/60ML PO SUSP
30.0000 g | ORAL | Status: AC
  Administered 2011-10-22: 30 g via ORAL
  Filled 2011-10-22: qty 120

## 2011-10-22 MED ORDER — SODIUM CHLORIDE 0.9 % IV SOLN
INTRAVENOUS | Status: DC
  Administered 2011-10-22 – 2011-10-24 (×5): via INTRAVENOUS
  Administered 2011-10-25: 125 mL/h via INTRAVENOUS

## 2011-10-22 MED ORDER — SODIUM CHLORIDE 0.9 % IJ SOLN
INTRAMUSCULAR | Status: AC
  Administered 2011-10-22: 3 mL
  Filled 2011-10-22: qty 3

## 2011-10-22 MED ORDER — VANCOMYCIN HCL IN DEXTROSE 1-5 GM/200ML-% IV SOLN
1000.0000 mg | Freq: Once | INTRAVENOUS | Status: AC
  Administered 2011-10-22: 1000 mg via INTRAVENOUS
  Filled 2011-10-22: qty 200

## 2011-10-22 MED ORDER — INSULIN ASPART 100 UNIT/ML ~~LOC~~ SOLN
0.0000 [IU] | Freq: Every day | SUBCUTANEOUS | Status: DC
  Administered 2011-10-23: 2 [IU] via SUBCUTANEOUS
  Administered 2011-10-24 – 2011-10-25 (×2): 3 [IU] via SUBCUTANEOUS

## 2011-10-22 MED ORDER — MORPHINE SULFATE 4 MG/ML IJ SOLN
4.0000 mg | Freq: Once | INTRAMUSCULAR | Status: AC
  Administered 2011-10-22: 4 mg via INTRAVENOUS
  Filled 2011-10-22: qty 1

## 2011-10-22 MED ORDER — SODIUM CHLORIDE 0.9 % IV BOLUS (SEPSIS)
1000.0000 mL | Freq: Once | INTRAVENOUS | Status: AC
  Administered 2011-10-22: 1000 mL via INTRAVENOUS

## 2011-10-22 MED ORDER — LEVOFLOXACIN IN D5W 750 MG/150ML IV SOLN
750.0000 mg | INTRAVENOUS | Status: DC
  Administered 2011-10-22 – 2011-10-25 (×4): 750 mg via INTRAVENOUS
  Filled 2011-10-22 (×4): qty 150

## 2011-10-22 MED ORDER — TAMSULOSIN HCL 0.4 MG PO CAPS
0.4000 mg | ORAL_CAPSULE | ORAL | Status: DC
  Administered 2011-10-22 – 2011-10-26 (×5): 0.4 mg via ORAL
  Filled 2011-10-22 (×5): qty 1

## 2011-10-22 MED ORDER — TRAZODONE HCL 50 MG PO TABS
50.0000 mg | ORAL_TABLET | Freq: Every day | ORAL | Status: DC
  Administered 2011-10-22 – 2011-10-25 (×4): 50 mg via ORAL
  Filled 2011-10-22 (×4): qty 1

## 2011-10-22 MED ORDER — DEXTROSE 5 % IV SOLN
1.0000 g | Freq: Three times a day (TID) | INTRAVENOUS | Status: DC
  Administered 2011-10-22 – 2011-10-25 (×10): 1 g via INTRAVENOUS
  Filled 2011-10-22 (×13): qty 1

## 2011-10-22 MED ORDER — ACETAMINOPHEN 650 MG RE SUPP
975.0000 mg | Freq: Once | RECTAL | Status: AC
  Administered 2011-10-22: 975 mg via RECTAL
  Filled 2011-10-22: qty 3

## 2011-10-22 MED ORDER — INSULIN GLARGINE 100 UNIT/ML ~~LOC~~ SOLN
20.0000 [IU] | Freq: Every day | SUBCUTANEOUS | Status: DC
  Administered 2011-10-22 – 2011-10-25 (×4): 20 [IU] via SUBCUTANEOUS
  Filled 2011-10-22: qty 3

## 2011-10-22 MED ORDER — INSULIN ASPART 100 UNIT/ML IV SOLN
10.0000 [IU] | Freq: Once | INTRAVENOUS | Status: AC
  Administered 2011-10-22: 10 [IU] via INTRAVENOUS
  Filled 2011-10-22: qty 0.1

## 2011-10-22 MED ORDER — HEPARIN SODIUM (PORCINE) 5000 UNIT/ML IJ SOLN
5000.0000 [IU] | Freq: Three times a day (TID) | INTRAMUSCULAR | Status: DC
  Administered 2011-10-22 – 2011-10-26 (×12): 5000 [IU] via SUBCUTANEOUS
  Filled 2011-10-22 (×12): qty 1

## 2011-10-22 MED ORDER — ONDANSETRON HCL 4 MG/2ML IJ SOLN
4.0000 mg | Freq: Once | INTRAMUSCULAR | Status: AC
  Administered 2011-10-22: 4 mg via INTRAVENOUS
  Filled 2011-10-22: qty 2

## 2011-10-22 MED ORDER — SODIUM CHLORIDE 0.9 % IJ SOLN
INTRAMUSCULAR | Status: AC
  Administered 2011-10-22: 10 mL
  Filled 2011-10-22: qty 3

## 2011-10-22 MED ORDER — ALUM & MAG HYDROXIDE-SIMETH 200-200-20 MG/5ML PO SUSP
30.0000 mL | ORAL | Status: DC | PRN
  Administered 2011-10-22 – 2011-10-25 (×3): 30 mL via ORAL
  Filled 2011-10-22 (×5): qty 30

## 2011-10-22 MED ORDER — ZIPRASIDONE HCL 40 MG PO CAPS
40.0000 mg | ORAL_CAPSULE | Freq: Two times a day (BID) | ORAL | Status: DC
  Administered 2011-10-22 – 2011-10-26 (×9): 40 mg via ORAL
  Filled 2011-10-22 (×15): qty 1

## 2011-10-22 MED ORDER — SERTRALINE HCL 50 MG PO TABS
150.0000 mg | ORAL_TABLET | ORAL | Status: DC
  Administered 2011-10-22 – 2011-10-26 (×5): 150 mg via ORAL
  Filled 2011-10-22 (×5): qty 3

## 2011-10-22 MED ORDER — PNEUMOCOCCAL VAC POLYVALENT 25 MCG/0.5ML IJ INJ
0.5000 mL | INJECTION | INTRAMUSCULAR | Status: AC
  Administered 2011-10-23: 0.5 mL via INTRAMUSCULAR
  Filled 2011-10-22: qty 0.5

## 2011-10-22 NOTE — ED Notes (Signed)
Patient complaining of vomiting and abdominal pain starting approximately 30 minutes ago.

## 2011-10-22 NOTE — Progress Notes (Signed)
Patient admitted from [place] on paper chart for MD Signature- will await call back from ALF Rep for futher d/c planning and determination of POA/Guardian. Eduard Clos, MSW, [place]

## 2011-10-22 NOTE — ED Notes (Signed)
Patient complaining of abdominal pain starting approximately 30 minutes ago. Started vomiting during transport to ED.

## 2011-10-22 NOTE — Progress Notes (Signed)
Subjective: This is an assumption of care note. [name] is a patient of Dr. Josephine Cables and Dr. Legrand Rams is out of town. I have never seen Mr. School before but I have reviewed the information in his electronic medical record. He has a history of mental retardation, renal dysfunction, diabetes and autism. He also has listed diagnoses of gastroparesis and depression. He communicates by nodding and shaking his head. He lives in a nursing home. He was brought to the hospital because of pneumonia. When asked he says he does not have any complaints right now.  Objective: Vital signs in last 24 hours: Temp:  [97.8 F (36.6 C)-101.3 F (38.5 C)] 97.8 F (36.6 C) (02/18 0700) Pulse Rate:  [85-109] 85  (02/18 0700) Resp:  [18-20] 20  (02/18 0700) BP: (126-168)/(58-74) 155/62 mmHg (02/18 0700) SpO2:  [93 %-100 %] 93 % (02/18 0700) Weight:  [74.9 kg (165 lb 2 oz)-86.183 kg (190 lb)] 74.9 kg (165 lb 2 oz) (02/18 0700) Weight change:  Last BM Date: 10/20/11 (pt believes couple days ago)  Intake/Output from previous day:    PHYSICAL EXAM General appearance: alert and mild distress Resp: clear to auscultation bilaterally Cardio: regular rate and rhythm, S1, S2 normal, no murmur, click, rub or gallop GI: soft, non-tender; bowel sounds normal; no masses,  no organomegaly Extremities: extremities normal, atraumatic, no cyanosis or edema  Lab Results:    Basic Metabolic Panel:  Basename 10/22/11 0419  NA 130*  K 5.8*  CL 96  CO2 18*  GLUCOSE 357*  BUN 53*  CREATININE 2.17*  CALCIUM 10.3  MG --  PHOS --   Liver Function Tests:  [facility] 10/22/11 0419  AST 46*  ALT 48  ALKPHOS 83  BILITOT 0.5  PROT 8.0  ALBUMIN 4.0    Basename 10/22/11 0419  LIPASE 13  AMYLASE --   No results found for this basename: AMMONIA:2 in the last 72 hours CBC:  Basename 10/22/11 0419  WBC 10.1  NEUTROABS 9.1*  HGB 10.5*  HCT 31.8*  MCV 81.3  PLT 197   Cardiac Enzymes: No results found for  this basename: CKTOTAL:3,CKMB:3,CKMBINDEX:3,TROPONINI:3 in the last 72 hours BNP: No results found for this basename: PROBNP:3 in the last 72 hours D-Dimer: No results found for this basename: DDIMER:2 in the last 72 hours CBG:  Basename 10/22/11 0554 10/22/11 0412  GLUCAP 216* 329*   Hemoglobin A1C: No results found for this basename: HGBA1C in the last 72 hours Fasting Lipid Panel: No results found for this basename: CHOL,HDL,LDLCALC,TRIG,CHOLHDL,LDLDIRECT in the last 72 hours Thyroid Function Tests: No results found for this basename: TSH,T4TOTAL,FREET4,T3FREE,THYROIDAB in the last 72 hours Anemia Panel: No results found for this basename: VITAMINB12,FOLATE,FERRITIN,TIBC,IRON,RETICCTPCT in the last 72 hours Coagulation: No results found for this basename: LABPROT:2,INR:2 in the last 72 hours Urine Drug Screen: Drugs of Abuse  No results found for this basename: labopia, cocainscrnur, labbenz, amphetmu, thcu, labbarb    Alcohol Level: No results found for this basename: ETH:2 in the last 72 hours Urinalysis:  Basename 10/22/11 0449  COLORURINE YELLOW  LABSPEC 1.015  PHURINE 6.0  GLUCOSEU >1000*  HGBUR LARGE*  BILIRUBINUR NEGATIVE  KETONESUR TRACE*  PROTEINUR NEGATIVE  UROBILINOGEN 0.2  NITRITE NEGATIVE  [place]. Labs:  ABGS No results found for this basename: PHART,PCO2,PO2ART,TCO2,HCO3 in the last 72 hours CULTURES Recent Results (from the past 240 hour(s))  CULTURE, BLOOD (ROUTINE X 2)     Status: Normal (Preliminary result)   Collection Time  10/22/11  4:19 AM      Component Value Range Status Comment   Specimen Description Blood IV START   Final    Special Requests     Final    Value: BOTTLES DRAWN AEROBIC AND ANAEROBIC 6CC DRAWN BY RN   Culture PENDING   Incomplete    Report Status PENDING   Incomplete   CULTURE, BLOOD (ROUTINE X 2)     Status: Normal (Preliminary result)   Collection Time   10/22/11  4:27 AM      Component Value  Range Status Comment   Specimen Description Blood LEFT ANTECUBITAL   Final    Special Requests BOTTLES DRAWN AEROBIC AND ANAEROBIC [facility]   Final    Culture PENDING   Incomplete    Report Status PENDING   Incomplete    Studies/Results: Dg Chest Port 1 View  10/22/2011  *RADIOLOGY REPORT*  Clinical Data: Chest pain and abdominal pain; lower back pain.  PORTABLE CHEST - 1 VIEW  Comparison: Chest radiograph performed 09/18/2011  Findings: The lungs are well-aerated.  Patchy right midlung airspace opacity raises concern for pneumonia.  The left lung now appears essentially clear.  There is no evidence of pleural effusion or pneumothorax.  The cardiomediastinal silhouette is within normal limits.  No acute osseous abnormalities are seen.  IMPRESSION: Patchy right midlung airspace opacity raises concern for pneumonia.  Original Report Authenticated By: Santa Lighter, M.D.    Medications:  Prior to Admission:  Prescriptions prior to admission  Medication Sig Dispense Refill  . acetaminophen (TYLENOL) 325 MG tablet Take 650 mg by mouth every 6 (six) hours as needed.      . bisacodyl (DULCOLAX) 10 MG suppository Place 10 mg rectally as needed.      . insulin aspart (NOVOLOG) 100 UNIT/ML injection Inject 15 Units into the skin 3 (three) times daily before meals. Per sliding scale instructions: If 150-200= 1 unit, 201-250= 2 units, 251-300= 3 units, 301-350= 4 units, 351-400= 5 units, Greater than 400= 6 units      . insulin glargine (LANTUS) 100 UNIT/ML injection Inject 40 Units into the skin at bedtime.       Marland Kitchen lisinopril (PRINIVIL,ZESTRIL) 10 MG tablet Take 10 mg by mouth every morning.       . ranitidine (ZANTAC) 150 MG capsule Take 150 mg by mouth 2 (two) times daily.        . sertraline (ZOLOFT) 50 MG tablet Take 150 mg by mouth every morning.       . sulfamethoxazole-trimethoprim (BACTRIM DS) 800-160 MG per tablet Take 1 tablet by mouth every evening.       . Tamsulosin HCl (FLOMAX) 0.4 MG CAPS Take  0.4 mg by mouth every morning.       . traZODone (DESYREL) 50 MG tablet Take 50 mg by mouth at bedtime.        . ziprasidone (GEODON) 40 MG capsule Take 40 mg by mouth 2 (two) times daily with a meal.         Scheduled:   . acetaminophen  975 mg Rectal Once  . ceFEPime (MAXIPIME) IV  1 g Intravenous Q8H  . heparin  5,000 Units Subcutaneous Q8H  . insulin aspart  0-15 Units Subcutaneous TID WC  . insulin aspart  0-5 Units Subcutaneous QHS  . insulin aspart  10 Units Intravenous Once  . insulin glargine  20 Units Subcutaneous QHS  . levofloxacin (LEVAQUIN) IV  750 mg Intravenous Q24H  .  morphine  injection  4 mg Intravenous Once  . ondansetron  4 mg Intravenous Once  . piperacillin-tazobactam (ZOSYN)  IV  3.375 g Intravenous Once  . sertraline  150 mg Oral BH-q7a  . sodium chloride  1,000 mL Intravenous Once  . sodium chloride  1,000 mL Intravenous Once  . sodium chloride  1,000 mL Intravenous Once  . sodium polystyrene  30 g Oral NOW  . Tamsulosin HCl  0.4 mg Oral BH-q7a  . traZODone  50 mg Oral QHS  . vancomycin  1,250 mg Intravenous Q24H  . vancomycin  1,000 mg Intravenous Once  . ziprasidone  40 mg Oral BID WC   Continuous:   . sodium chloride 125 mL/hr at 10/22/11 0813   PRN:  Assesment: He has healthcare associated pneumonia. This is complicated by the fact that he has mental retardation. He does have some inflammatory response and he is on triple antibiotic treatment now. He looks pretty comfortable at this point. He also does have a chronic indwelling Foley catheter because of BPH Principal Problem:  *HCAP (healthcare-associated pneumonia) Active Problems:  Mental retardation  BPH (benign prostatic hyperplasia)  SIRS (systemic inflammatory response syndrome)  ARF (acute renal failure)  Hyperkalemia  Anemia  Hyperglycemia  Abnormal urinalysis  Chronic indwelling foley catheter    Plan: I will continue with his Triple Antibiotic coverage. He has an influenza swab  pending. I did not change any of his other treatments.    LOS: 0 days   Demetri Kerman L 10/22/2011, 8:10 AM

## 2011-10-22 NOTE — ED Notes (Signed)
Staff at [facility] report pt's foley cath was changed at the beginning of the month, Dr. Zollie Scale advises to replace catheter

## 2011-10-22 NOTE — Plan of Care (Signed)
Problem: Phase I Progression Outcomes Goal: Voiding-avoid urinary catheter unless indicated Outcome: Not Applicable Date Met:  [id number] Patient has chronic foley catheter in place.

## 2011-10-22 NOTE — Progress Notes (Signed)
10/22/11 1423 MRSA PCR and flu PCR negative per lab report, droplet and contact precautions d/c'd. Notified Dr Luan Pulling of results and K+ of 5.6 this afternoon, orders received. Also notified of c/o abdominal pain intermittently, pt stated decreased with maalox as ordered earlier today for gas pains, will monitor.

## 2011-10-22 NOTE — H&P (Signed)
[name] is an 51 y.o. male.    PCP: Rosita Fire, MD, MD   Chief Complaint: Nausea, vomiting, fever  HPI: This is a 51 year old, African American male, who lives in a local nursing facility. He has a history of mental retardation and is unable to provide much history because he is unable to communicate. Actually, more specifically, he is unable to verbalize, but he can shake his head for yes or no answer. He was sent over here because patient was having some vomiting, and abdominal pain, that started about 2 hours ago, in the nursing facility. Patient at this time denies any abdominal discomfort. He did have one episode of vomiting. He denies any cough. Denies any chest pain. Denies any difficulty breathing. He appears to be no distress at this time. He, says the pain has completely resolved in his abdomen. History is limited because of his mental retardation.   Home Medications: Prior to Admission medications   Medication Sig Start Date End Date Taking? Authorizing Provider  acetaminophen (TYLENOL) 325 MG tablet Take 650 mg by mouth every 6 (six) hours as needed.   Yes Historical Provider, MD  bisacodyl (DULCOLAX) 10 MG suppository Place 10 mg rectally as needed.   Yes Historical Provider, MD  insulin aspart (NOVOLOG) 100 UNIT/ML injection Inject 15 Units into the skin 3 (three) times daily before meals. Per sliding scale instructions: If 150-200= 1 unit, 201-250= 2 units, 251-300= 3 units, 301-350= 4 units, 351-400= 5 units, Greater than 400= 6 units    Historical Provider, MD  insulin glargine (LANTUS) 100 UNIT/ML injection Inject 40 Units into the skin at bedtime.     Historical Provider, MD  lisinopril (PRINIVIL,ZESTRIL) 10 MG tablet Take 10 mg by mouth every morning.     Historical Provider, MD  ranitidine (ZANTAC) 150 MG capsule Take 150 mg by mouth 2 (two) times daily.      Historical Provider, MD  sertraline (ZOLOFT) 50 MG tablet Take 150 mg by mouth every morning.      Historical Provider, MD  sulfamethoxazole-trimethoprim (BACTRIM DS) 800-160 MG per tablet Take 1 tablet by mouth every evening.  03/27/11 10/22/11  Historical Provider, MD  Tamsulosin HCl (FLOMAX) 0.4 MG CAPS Take 0.4 mg by mouth every morning.     Historical Provider, MD  traZODone (DESYREL) 50 MG tablet Take 50 mg by mouth at bedtime.      Historical Provider, MD  ziprasidone (GEODON) 40 MG capsule Take 40 mg by mouth 2 (two) times daily with a meal.      Historical Provider, MD    Allergies: No Known Allergies  Past Medical History: Past Medical History  Diagnosis Date  . Renal disorder   . Elective mutism   . Autism   . Hyperosmolar (nonketotic) coma   . Hyperglycemia   . Diabetes mellitus   . Hyponatremia   . Mental retardation   . Urinary retention   . BPH (benign prostatic hyperplasia)   . Gastroparesis   . Depression     Social History:  reports that he has never smoked. He does not have any smokeless tobacco history on file. He reports that he does not drink alcohol or use illicit drugs.  Family History: Unable to obtain from the patient  Review of Systems - unobtainable from patient due to mental status  Physical Examination Blood pressure 141/65, pulse 89, temperature 101.3 F (38.5 C), temperature source Rectal, resp. rate 20, height 6' (1.829 m), weight 86.183 kg (190 lb),  SpO2 100.00%.  General appearance: alert, cooperative, distracted and no distress Head: Normocephalic, without obvious abnormality, atraumatic Eyes: conjunctivae/corneas clear. PERRL, EOM's intact. Throat: lips, mucosa, and tongue normal; teeth and gums normal Neck: no adenopathy, no carotid bruit, no JVD, supple, symmetrical, trachea midline and thyroid not enlarged, symmetric, no tenderness/mass/nodules Resp: Decreased air entry at the bases. No wheezing is appreciated. No definite crackles heard. Cardio: S1-S2, slightly tachycardic, but regular. No S3, S4. No rubs, murmurs, or bruits. No  pedal edema. No JVD. GI: soft, non-tender; bowel sounds normal; no masses,  no organomegaly Extremities: extremities normal, atraumatic, no cyanosis or edema Pulses: 2+ and symmetric Skin: Skin color, texture, turgor normal. No rashes or lesions Lymph nodes: Cervical, supraclavicular, and axillary nodes normal. Neurologic: Patient does have mental retardation. He is unable to verbalize. Otherwise, no focal deficits were noted.  Laboratory Data: Results for orders placed during the hospital encounter of 10/22/11 (from the past 48 hour(s))  GLUCOSE, CAPILLARY     Status: Abnormal   Collection Time   10/22/11  4:12 AM      Component Value Range Comment   Glucose-Capillary 329 (*) 70 - 99 (mg/dL)   CBC     Status: Abnormal   Collection Time   10/22/11  4:19 AM      Component Value Range Comment   WBC 10.1  4.0 - 10.5 (K/uL)    RBC 3.91 (*) 4.22 - 5.81 (MIL/uL)    Hemoglobin 10.5 (*) 13.0 - 17.0 (g/dL)    HCT 31.8 (*) 39.0 - 52.0 (%)    MCV 81.3  78.0 - 100.0 (fL)    MCH 26.9  26.0 - 34.0 (pg)    MCHC 33.0  30.0 - 36.0 (g/dL)    RDW 13.8  11.5 - 15.5 (%)    Platelets 197  150 - 400 (K/uL)   DIFFERENTIAL     Status: Abnormal   Collection Time   10/22/11  4:19 AM      Component Value Range Comment   Neutrophils Relative 91 (*) 43 - 77 (%)    Neutro Abs 9.1 (*) 1.7 - 7.7 (K/uL)    Lymphocytes Relative 5 (*) 12 - 46 (%)    Lymphs Abs 0.6 (*) 0.7 - 4.0 (K/uL)    Monocytes Relative 3  3 - 12 (%)    Monocytes Absolute 0.3  0.1 - 1.0 (K/uL)    Eosinophils Relative 1  0 - 5 (%)    Eosinophils Absolute 0.1  0.0 - 0.7 (K/uL)    Basophils Relative 0  0 - 1 (%)    Basophils Absolute 0.0  0.0 - 0.1 (K/uL)   COMPREHENSIVE METABOLIC PANEL     Status: Abnormal   Collection Time   10/22/11  4:19 AM      Component Value Range Comment   Sodium 130 (*) 135 - 145 (mEq/L)    Potassium 5.8 (*) 3.5 - 5.1 (mEq/L)    Chloride 96  96 - 112 (mEq/L)    CO2 18 (*) 19 - 32 (mEq/L)    Glucose, Bld 357 (*) 70  - 99 (mg/dL)    BUN 53 (*) 6 - 23 (mg/dL)    Creatinine, Ser 2.17 (*) 0.50 - 1.35 (mg/dL)    Calcium 10.3  8.4 - 10.5 (mg/dL)    Total Protein 8.0  6.0 - 8.3 (g/dL)    Albumin 4.0  3.5 - 5.2 (g/dL)    AST 46 (*) 0 - 37 (U/L)  ALT 48  0 - 53 (U/L)    Alkaline Phosphatase 83  39 - 117 (U/L)    Total Bilirubin 0.5  0.3 - 1.2 (mg/dL)    GFR calc non Af Amer 34 (*) >90 (mL/min)    GFR calc Af Amer 39 (*) >90 (mL/min)   LIPASE, BLOOD     Status: Normal   Collection Time   10/22/11  4:19 AM      Component Value Range Comment   Lipase 13  11 - 59 (U/L)   CULTURE, BLOOD (ROUTINE X 2)     Status: Normal (Preliminary result)   Collection Time   10/22/11  4:19 AM      Component Value Range Comment   Specimen Description Blood IV START      Special Requests        Value: BOTTLES DRAWN AEROBIC AND ANAEROBIC 6CC DRAWN BY RN   Culture PENDING      Report Status PENDING     LACTIC ACID, PLASMA     Status: Abnormal   Collection Time   10/22/11  4:26 AM      Component Value Range Comment   Lactic Acid, Venous 4.3 (*) 0.5 - 2.2 (mmol/L)   PROCALCITONIN     Status: Normal   Collection Time   10/22/11  4:27 AM      Component Value Range Comment   Procalcitonin 0.16     CULTURE, BLOOD (ROUTINE X 2)     Status: Normal (Preliminary result)   Collection Time   10/22/11  4:27 AM      Component Value Range Comment   Specimen Description Blood LEFT ANTECUBITAL      Special Requests BOTTLES DRAWN AEROBIC AND ANAEROBIC 6CC      Culture PENDING      Report Status PENDING     URINALYSIS, ROUTINE W REFLEX MICROSCOPIC     Status: Abnormal   Collection Time   10/22/11  4:49 AM      Component Value Range Comment   Color, Urine YELLOW  YELLOW     APPearance CLEAR  CLEAR     Specific Gravity, Urine 1.015  1.005 - 1.030     pH 6.0  5.0 - 8.0     Glucose, UA >1000 (*) NEGATIVE (mg/dL)    Hgb urine dipstick LARGE (*) NEGATIVE     Bilirubin Urine NEGATIVE  NEGATIVE     Ketones, ur TRACE (*) NEGATIVE (mg/dL)     Protein, ur NEGATIVE  NEGATIVE (mg/dL)    Urobilinogen, UA 0.2  0.0 - 1.0 (mg/dL)    Nitrite NEGATIVE  NEGATIVE     Leukocytes, UA NEGATIVE  NEGATIVE    URINE MICROSCOPIC-ADD ON     Status: Abnormal   Collection Time   10/22/11  4:49 AM      Component Value Range Comment   Squamous Epithelial / LPF RARE  RARE     WBC, UA 3-6  <3 (WBC/hpf)    RBC / HPF 21-50  <3 (RBC/hpf)    Bacteria, UA MANY (*) RARE     Urine-Other MANY YEAST     GLUCOSE, CAPILLARY     Status: Abnormal   Collection Time   10/22/11  5:54 AM      Component Value Range Comment   Glucose-Capillary 216 (*) 70 - 99 (mg/dL)     Radiology Reports: Dg Chest Port 1 View  10/22/2011  *RADIOLOGY REPORT*  Clinical Data: Chest pain and abdominal pain; lower back  pain.  PORTABLE CHEST - 1 VIEW  Comparison: Chest radiograph performed 09/18/2011  Findings: The lungs are well-aerated.  Patchy right midlung airspace opacity raises concern for pneumonia.  The left lung now appears essentially clear.  There is no evidence of pleural effusion or pneumothorax.  The cardiomediastinal silhouette is within normal limits.  No acute osseous abnormalities are seen.  IMPRESSION: Patchy right midlung airspace opacity raises concern for pneumonia.  Original Report Authenticated By: Santa Lighter, M.D.    Electrocardiogram: EKG shows sinus rhythm at 99 beats per minute with normal axis. Intervals appear to be in the normal range.  Possible Q waves in leads V1 and V2.Marland Kitchen No concerning ST changes. Slightly prominent T waves noted in lead V3.   Assessment/Plan  Principal Problem:  *HCAP (healthcare-associated pneumonia) Active Problems:  Mental retardation  BPH (benign prostatic hyperplasia)  SIRS (systemic inflammatory response syndrome)  ARF (acute renal failure)  Hyperkalemia  Anemia  Hyperglycemia  Abnormal urinalysis  Chronic indwelling foley catheter    #1 healthcare associated pneumonia with SIRS: He'll be treated with broad-spectrum  antibiotics. Blood cultures will be followed up on. Influenza B PCR will be sent. Oxygen will be provided.  #2 abnormal, UA: He has been positive for yeast in the past and has just finished the course of Diflucan. His Foley catheter will be changed. Urine cultures will be followed up on.  #3 acute renal failure with hyperkalemia: He'll be given Kayexalate. He'll be given IV fluids. A repeat metabolic panel will be checked later today.  #4 hyponatremia. Will be corrected with IV fluids.  #5 he does have mild metabolic acidosis. It's most probably from his renal insufficiency. We'll give him IV fluids, and reevaluate his Bmet later today.  #6 hyperglycemia in the setting of known diabetes. HbA1c will be checked to see how well. His blood sugars are controlled. He'll be put on a sliding scale insulin and Lantus for now will be resumed on a twice a day basis and this can be changed to a nightly dose in a day or 2.  #7 history of BPH: Continue with Flomax.  #8 anemia continue to monitor.  #9 transient abdominal pain, and vomiting: His abdomen is completely benign at this time. The symptoms may have been due to fever and acute illness, as well as the Foley catheter. At this time no further need for imaging studies. If he has recurrence of his symptoms imaging studies can be considered  As far as we know patient is a full code.  Dr. Legrand Rams will assume care of this patient later today   [facility] Pager [phone]  10/22/2011, 6:13 AM

## 2011-10-22 NOTE — Consult Note (Signed)
ANTIBIOTIC CONSULT NOTE - INITIAL  Pharmacy Consult for Vancomycin, Levaquin, Cefepime  Indication: rule out pneumonia  No Known Allergies  Patient Measurements: Height: 6' (182.9 cm) Weight: 165 lb 2 oz (74.9 kg) IBW/kg (Calculated) : 77.6   Vital Signs: Temp: 97.8 F (36.6 C) (02/18 0700) Temp src: Oral (02/18 0700) BP: 155/62 mmHg (02/18 0700) Pulse Rate: 85  (02/18 0700) Intake/Output from previous day:   Intake/Output from this shift:    Labs:  [facility] 10/22/11 0419  WBC 10.1  HGB 10.5*  PLT 197  LABCREA --  CREATININE 2.17*   Estimated Creatinine Clearance: 43.6 ml/min (by C-G formula based on Cr of 2.17). No results found for this basename: VANCOTROUGH:2,VANCOPEAK:2,VANCORANDOM:2,GENTTROUGH:2,GENTPEAK:2,GENTRANDOM:2,TOBRATROUGH:2,TOBRAPEAK:2,TOBRARND:2,AMIKACINPEAK:2,AMIKACINTROU:2,AMIKACIN:2, in the last 72 hours   Microbiology: Recent Results (from the past 720 hour(s))  URINE CULTURE     Status: Normal   Collection Time   10/06/11  7:30 PM      Component Value Range Status Comment   Specimen Description URINE, CATHETERIZED   Final    Special Requests Normal   Final    Culture  Setup Time [id number]   Final    Colony Count 75,000 COLONIES/ML   Final    Culture YEAST   Final    Report Status 10/09/2011 FINAL   Final   URINE CULTURE     Status: Normal   Collection Time   10/10/11  4:07 PM      Component Value Range Status Comment   Specimen Description URINE, CATHETERIZED   Final    Special Requests NONE   Final    Culture  Setup Time [id number]   Final    Colony Count >=100,000 COLONIES/ML   Final    Culture YEAST   Final    Report Status 10/12/2011 FINAL   Final   CULTURE, BLOOD (ROUTINE X 2)     Status: Normal (Preliminary result)   Collection Time   10/22/11  4:19 AM      Component Value Range Status Comment   Specimen Description Blood IV START   Final    Special Requests     Final    Value: BOTTLES DRAWN AEROBIC AND ANAEROBIC 6CC DRAWN BY RN    Culture PENDING   Incomplete    Report Status PENDING   Incomplete   CULTURE, BLOOD (ROUTINE X 2)     Status: Normal (Preliminary result)   Collection Time   10/22/11  4:27 AM      Component Value Range Status Comment   Specimen Description Blood LEFT ANTECUBITAL   Final    Special Requests BOTTLES DRAWN AEROBIC AND ANAEROBIC [facility]   Final    Culture PENDING   Incomplete    Report Status PENDING   Incomplete     Medical History: Past Medical History  Diagnosis Date  . Renal disorder   . Elective mutism   . Autism   . Hyperosmolar (nonketotic) coma   . Hyperglycemia   . Diabetes mellitus   . Hyponatremia   . Mental retardation   . Urinary retention   . BPH (benign prostatic hyperplasia)   . Gastroparesis   . Depression    Medications:  Scheduled:    . acetaminophen  975 mg Rectal Once  . ceFEPime (MAXIPIME) IV  1 g Intravenous Q8H  . heparin  5,000 Units Subcutaneous Q8H  . insulin aspart  0-15 Units Subcutaneous TID WC  . insulin aspart  0-5 Units Subcutaneous QHS  . insulin aspart  10 Units Intravenous  Once  . insulin glargine  20 Units Subcutaneous QHS  . levofloxacin (LEVAQUIN) IV  750 mg Intravenous Q24H  .  morphine injection  4 mg Intravenous Once  . ondansetron  4 mg Intravenous Once  . piperacillin-tazobactam (ZOSYN)  IV  3.375 g Intravenous Once  . sertraline  150 mg Oral BH-q7a  . sodium chloride  1,000 mL Intravenous Once  . sodium chloride  1,000 mL Intravenous Once  . sodium chloride  1,000 mL Intravenous Once  . sodium polystyrene  30 g Oral NOW  . Tamsulosin HCl  0.4 mg Oral BH-q7a  . traZODone  50 mg Oral QHS  . vancomycin  1,250 mg Intravenous Q24H  . vancomycin  1,000 mg Intravenous Once  . ziprasidone  40 mg Oral BID WC   Assessment: Elevated SCr, renal insuff.  Goal of Therapy:  Vancomycin trough level 15-20 mcg/ml  Plan: Vancomycin 1gm already given today Vancomycin 1250mg  iv q24hrs (maintenance dose) Monitor SCr daily x 3, adjust ABX  doses as needed Cefepime 1gm iv q8hrs Levaquin 750mg  iv q24hrs  [name], [name] 10/22/2011,7:50 AM

## 2011-10-22 NOTE — Progress Notes (Signed)
Inpatient Diabetes Program Recommendations  AACE/ADA: New Consensus Statement on Inpatient Glycemic Control (2009)  Target Ranges:  Prepandial:   less than 140 mg/dL      Peak postprandial:   less than 180 mg/dL (1-2 hours)      Critically ill patients:  140 - 180 mg/dL   Reason for Visit: Elevated glucose: 317 mg/dL  Inpatient Diabetes Program Recommendations Insulin - Basal: Increase to  pre-admission dose of Lantus of 40 units qhs

## 2011-10-22 NOTE — ED Provider Notes (Signed)
History     CSN: [id number]  Arrival date & time 10/22/11  0403   First MD Initiated Contact with Patient 10/22/11 0408      Chief Complaint  Patient presents with  . Abdominal Pain  . Emesis    Patient is a 51 y.o. male presenting with abdominal pain and vomiting. The history is limited by a developmental delay.  Abdominal Pain The primary symptoms of the illness include abdominal pain, fever, nausea and vomiting. The primary symptoms of the illness do not include diarrhea. The current episode started less than 1 hour ago. The onset of the illness was sudden. The problem has been gradually worsening.  Additional symptoms associated with the illness include chills.  Emesis  Associated symptoms include abdominal pain, chills and a fever. Pertinent negatives include no diarrhea.  Pt presents from nursing facility for abdominal pain Pt is mostly nonverbal at baseline However, it is reported that he was in pain and was pointing to his abdomen as source of pain En route, EMS reports that he had large amt of nonbloody emesis He is able to answer questions by shaking his head yes/no He denies cp/sob He reports cough He denies new weakness He denies new back pain  Past Medical History  Diagnosis Date  . Renal disorder   . Elective mutism   . Autism   . Hyperosmolar (nonketotic) coma   . Hyperglycemia   . Diabetes mellitus   . Hyponatremia   . Mental retardation   . Urinary retention   . BPH (benign prostatic hyperplasia)   . Gastroparesis   . Depression     History reviewed. No pertinent past surgical history.  History reviewed. No pertinent family history.  History  Substance Use Topics  . Smoking status: Never Smoker   . Smokeless tobacco: Not on file  . Alcohol Use: No     History of mental retardation and nonverbal - level 5 caveat Review of Systems  Unable to perform ROS: Other  Constitutional: Positive for fever and chills.  Gastrointestinal: Positive for  nausea, vomiting and abdominal pain. Negative for diarrhea.    Allergies  Review of patient's allergies indicates no known allergies.  Home Medications   Current Outpatient Rx  Name Route Sig Dispense Refill  . INSULIN ASPART 100 UNIT/ML [place] SOLN Subcutaneous Inject 15 Units into the skin 3 (three) times daily before meals. Per sliding scale instructions: If 150-200= 1 unit, 201-250= 2 units, 251-300= 3 units, 301-350= 4 units, 351-400= 5 units, Greater than 400= 6 units    . INSULIN GLARGINE 100 UNIT/ML [place] SOLN Subcutaneous Inject 40 Units into the skin at bedtime.     Marland Kitchen LISINOPRIL 10 MG PO TABS Oral Take 10 mg by mouth every morning.     Marland Kitchen RANITIDINE HCL 150 MG PO CAPS Oral Take 150 mg by mouth 2 (two) times daily.      . SERTRALINE HCL 50 MG PO TABS Oral Take 150 mg by mouth every morning.     . SULFAMETHOXAZOLE-TMP DS 800-160 MG PO TABS Oral Take 1 tablet by mouth every evening.     Marland Kitchen TAMSULOSIN HCL 0.4 MG PO CAPS Oral Take 0.4 mg by mouth every morning.     . TRAZODONE HCL 50 MG PO TABS Oral Take 50 mg by mouth at bedtime.      Marland Kitchen ZIPRASIDONE HCL 40 MG PO CAPS Oral Take 40 mg by mouth 2 (two) times daily with a meal.  BP 168/74  Pulse 109  Temp(Src) 100.2 F (37.9 C) (Oral)  Resp 18  Ht 6' (1.829 m)  Wt 190 lb (86.183 kg)  BMI 25.77 kg/m2  SpO2 96%  Physical Exam CONSTITUTIONAL: Well developed/well nourished HEAD AND FACE: Normocephalic/atraumatic EYES: EOMI/PERRL, no scleral icterus ENMT: Mucous membranes dry NECK: supple no meningeal signs SPINE:entire spine nontender CV: S1/S2 noted, no murmurs/rubs/gallops noted LUNGS: tachypnea noted.  Poor air entry bilaterally ABDOMEN: soft, tenderness noted to LLQ, no rigidity noted GU:no cva tenderness.  Foley catheter in place on arrival to the ER, no signs of trauma.  No tenderness to scrotum.  Chaperone present NEURO: Pt is awake/alert, moves all extremitiesx4.  He is nonverbal but is able to respond by shaking his head  yes or now EXTREMITIES: pulses normal, full ROM SKIN: warm, color normal.  No wounds noted back/buttocks PSYCH: anxious  ED Course  Procedures   CRITICAL CARE Performed by: Sharyon Cable   Total critical care time: 40  Critical care time was exclusive of separately billable procedures and treating other patients.  Critical care was necessary to treat or prevent imminent or life-threatening deterioration.  Critical care was time spent personally by me on the following activities: development of treatment plan with patient and/or surrogate as well as nursing, discussions with consultants, evaluation of patient's response to treatment, examination of patient, obtaining history from patient or surrogate, ordering and performing treatments and interventions, ordering and review of laboratory studies, ordering and review of radiographic studies, pulse oximetry and re-evaluation of patient's condition.   Labs Reviewed  GLUCOSE, CAPILLARY - Abnormal; Notable for the following:    Glucose-Capillary 329 (*)    All other components within normal limits  CBC  DIFFERENTIAL  COMPREHENSIVE METABOLIC PANEL  LIPASE, BLOOD  URINALYSIS, ROUTINE W REFLEX MICROSCOPIC  URINE CULTURE  LACTIC ACID, PLASMA  PROCALCITONIN  CULTURE, BLOOD (ROUTINE X 2)  CULTURE, BLOOD (ROUTINE X 2)   4:23 AM Pt with abd pain/vomiting, low grade fever Has h/o indwelling foley Will start with labs and acute abd xray Will follow closely  4:59 AM Pt has no abd tenderness at this time Will defer abd imaging for now Will get portable CXR  5:23 AM Lactate elevated Continue IV fluids Antibiotics ordered, urine could be source of likely sepsis His abdomen remain soft and he denies tenderness  5:28 AM D/w critical care, aware of patient, zosyn ordered and will add on vancomycin and admit to [facility] Will call for admission Pt stabilized in the ED   MDM  Nursing notes reviewed and considered in  documentation All labs/vitals reviewed and considered Previous records reviewed and considered xrays reviewed and considered        Date: 10/22/2011  Rate: 99  Rhythm: normal sinus rhythm  QRS Axis: normal  Intervals: normal  ST/T Wave abnormalities: nonspecific ST changes  Conduction Disutrbances:none      Sharyon Cable, MD 10/22/11 [phone]

## 2011-10-23 LAB — CBC
HCT: 27.2 % — ABNORMAL LOW (ref 39.0–52.0)
MCHC: 32.7 g/dL (ref 30.0–36.0)
MCV: 81.4 fL (ref 78.0–100.0)
RDW: 13.7 % (ref 11.5–15.5)

## 2011-10-23 LAB — GLUCOSE, CAPILLARY
Glucose-Capillary: 389 mg/dL — ABNORMAL HIGH (ref 70–99)
Glucose-Capillary: 62 mg/dL — ABNORMAL LOW (ref 70–99)
Glucose-Capillary: 211 mg/dL — ABNORMAL HIGH (ref 70–99)

## 2011-10-23 LAB — COMPREHENSIVE METABOLIC PANEL
ALT: 40 U/L (ref 0–53)
Albumin: 2.8 g/dL — ABNORMAL LOW (ref 3.5–5.2)
Alkaline Phosphatase: 78 U/L (ref 39–117)
Potassium: 4.4 mEq/L (ref 3.5–5.1)
Sodium: 135 mEq/L (ref 135–145)
Total Protein: 6.5 g/dL (ref 6.0–8.3)

## 2011-10-23 LAB — URINE CULTURE: Culture  Setup Time: 201302181015

## 2011-10-23 MED ORDER — FLEET ENEMA 7-19 GM/118ML RE ENEM: 1.0000 | ENEMA | Freq: Once | RECTAL | Status: DC

## 2011-10-23 MED ORDER — BISACODYL 10 MG RE SUPP
10.0000 mg | Freq: Once | RECTAL | Status: AC
  Administered 2011-10-23: 10 mg via RECTAL
  Filled 2011-10-23: qty 1

## 2011-10-23 MED ORDER — SODIUM CHLORIDE 0.9 % IJ SOLN
INTRAMUSCULAR | Status: AC
  Administered 2011-10-23: 3 mL
  Filled 2011-10-23: qty 3

## 2011-10-23 MED ORDER — PANTOPRAZOLE SODIUM 40 MG PO TBEC
40.0000 mg | DELAYED_RELEASE_TABLET | Freq: Every day | ORAL | Status: DC
  Administered 2011-10-23 – 2011-10-26 (×4): 40 mg via ORAL
  Filled 2011-10-23 (×4): qty 1

## 2011-10-23 MED ORDER — LISINOPRIL 10 MG PO TABS
10.0000 mg | ORAL_TABLET | Freq: Every day | ORAL | Status: DC
  Administered 2011-10-23 – 2011-10-26 (×4): 10 mg via ORAL
  Filled 2011-10-23 (×4): qty 1

## 2011-10-23 MED ORDER — HYDROCODONE-ACETAMINOPHEN 5-325 MG PO TABS
1.0000 | ORAL_TABLET | ORAL | Status: DC | PRN
  Administered 2011-10-23 – 2011-10-24 (×2): 1 via ORAL
  Filled 2011-10-23 (×2): qty 1

## 2011-10-23 NOTE — Progress Notes (Signed)
Subjective: He is being treated for healthcare associated pneumonia. His treatment is complicated by his mental retardation and difficulty with communication. He indicates that he is still having some abdominal discomfort which was a problem yesterday.  Objective: Vital signs in last 24 hours: Temp:  [97.5 F (36.4 C)-99 F (37.2 C)] 98.1 F (36.7 C) (02/19 0455) Pulse Rate:  [74-77] 77  (02/19 0455) Resp:  [18-20] 20  (02/19 0455) BP: (153-163)/(72-77) 163/72 mmHg (02/19 0455) SpO2:  [93 %-97 %] 94 % (02/19 0455) Weight change:  Last BM Date: 10/20/11  Intake/Output from previous day: 02/18 0701 - 02/19 0700 In: 3766 [P.O.:960; I.V.:2256; IV Piggyback:550] Out: 4150 [Urine:4150]  PHYSICAL EXAM General appearance: alert, mild distress and moderately obese Resp: rhonchi bilaterally Cardio: regular rate and rhythm, S1, S2 normal, no murmur, click, rub or gallop GI: Mildly diffusely tender with normal active bowel sounds Extremities: extremities normal, atraumatic, no cyanosis or edema  Lab Results:    Basic Metabolic Panel:  Basename 10/23/11 0538 10/22/11 1129  NA 135 134*  K 4.4 5.6*  CL 104 105  CO2 22 20  GLUCOSE 198* 317*  BUN 25* 40*  CREATININE 1.55* 1.82*  CALCIUM 9.3 8.9  MG -- --  PHOS -- --   Liver Function Tests:  [facility] 10/23/11 0538 10/22/11 0419  AST 41* 46*  ALT 40 48  ALKPHOS 78 83  BILITOT 0.8 0.5  PROT 6.5 8.0  ALBUMIN 2.8* 4.0    Basename 10/22/11 0419  LIPASE 13  AMYLASE --   No results found for this basename: AMMONIA:2 in the last 72 hours CBC:  Basename 10/23/11 0538 10/22/11 0419  WBC 12.1* 10.1  NEUTROABS -- 9.1*  HGB 8.9* 10.5*  HCT 27.2* 31.8*  MCV 81.4 81.3  PLT 163 197   Cardiac Enzymes: No results found for this basename: CKTOTAL:3,CKMB:3,CKMBINDEX:3,TROPONINI:3 in the last 72 hours BNP: No results found for this basename: PROBNP:3 in the last 72 hours D-Dimer: No results found for this basename: DDIMER:2 in  the last 72 hours CBG:  Basename 10/23/11 0730 10/22/11 2038 10/22/11 1620 10/22/11 1124 10/22/11 0814 10/22/11 0554  GLUCAP 197* 161* 71 304* 149* 216*   Hemoglobin A1C:  Basename 10/22/11 0912  HGBA1C 8.0*   Fasting Lipid Panel: No results found for this basename: CHOL,HDL,LDLCALC,TRIG,CHOLHDL,LDLDIRECT in the last 72 hours Thyroid Function Tests: No results found for this basename: TSH,T4TOTAL,FREET4,T3FREE,THYROIDAB in the last 72 hours Anemia Panel: No results found for this basename: VITAMINB12,FOLATE,FERRITIN,TIBC,IRON,RETICCTPCT in the last 72 hours Coagulation: No results found for this basename: LABPROT:2,INR:2 in the last 72 hours Urine Drug Screen: Drugs of Abuse  No results found for this basename: labopia, cocainscrnur, labbenz, amphetmu, thcu, labbarb    Alcohol Level: No results found for this basename: ETH:2 in the last 72 hours Urinalysis:  Basename 10/22/11 0449  COLORURINE YELLOW  LABSPEC 1.015  PHURINE 6.0  GLUCOSEU >1000*  HGBUR LARGE*  BILIRUBINUR NEGATIVE  KETONESUR TRACE*  PROTEINUR NEGATIVE  UROBILINOGEN 0.2  NITRITE NEGATIVE  [place]. Labs:  ABGS No results found for this basename: PHART,PCO2,PO2ART,TCO2,HCO3 in the last 72 hours CULTURES Recent Results (from the past 240 hour(s))  CULTURE, BLOOD (ROUTINE X 2)     Status: Normal (Preliminary result)   Collection Time   10/22/11  4:19 AM      Component Value Range Status Comment   Specimen Description Blood IV START   Final    Special Requests     Final  Value: BOTTLES DRAWN AEROBIC AND ANAEROBIC 6CC DRAWN BY RN   Culture NO GROWTH <24 HRS   Final    Report Status PENDING   Incomplete   CULTURE, BLOOD (ROUTINE X 2)     Status: Normal (Preliminary result)   Collection Time   10/22/11  4:27 AM      Component Value Range Status Comment   Specimen Description Blood LEFT ANTECUBITAL   Final    Special Requests BOTTLES DRAWN AEROBIC AND ANAEROBIC 6CC   Final     Culture NO GROWTH <24 HRS   Final    Report Status PENDING   Incomplete   URINE CULTURE     Status: Normal   Collection Time   10/22/11  4:49 AM      Component Value Range Status Comment   Specimen Description URINE, CATHETERIZED   Final    Special Requests NONE   Final    Culture  Setup Time [id number]   Final    Colony Count NO GROWTH   Final    Culture NO GROWTH   Final    Report Status 10/23/2011 FINAL   Final   MRSA PCR SCREENING     Status: Normal   Collection Time   10/22/11  8:12 AM      Component Value Range Status Comment   MRSA by PCR NEGATIVE  NEGATIVE  Final    Studies/Results: Dg Chest Port 1 View  10/22/2011  *RADIOLOGY REPORT*  Clinical Data: Chest pain and abdominal pain; lower back pain.  PORTABLE CHEST - 1 VIEW  Comparison: Chest radiograph performed 09/18/2011  Findings: The lungs are well-aerated.  Patchy right midlung airspace opacity raises concern for pneumonia.  The left lung now appears essentially clear.  There is no evidence of pleural effusion or pneumothorax.  The cardiomediastinal silhouette is within normal limits.  No acute osseous abnormalities are seen.  IMPRESSION: Patchy right midlung airspace opacity raises concern for pneumonia.  Original Report Authenticated By: Santa Lighter, M.D.    Medications:  Scheduled:   . ceFEPime (MAXIPIME) IV  1 g Intravenous Q8H  . heparin  5,000 Units Subcutaneous Q8H  . insulin aspart  0-15 Units Subcutaneous TID WC  . insulin aspart  0-5 Units Subcutaneous QHS  . insulin glargine  20 Units Subcutaneous QHS  . levofloxacin (LEVAQUIN) IV  750 mg Intravenous Q24H  . lisinopril  10 mg Oral Daily  . pantoprazole  40 mg Oral Q1200  . piperacillin-tazobactam (ZOSYN)  IV  3.375 g Intravenous Once  . pneumococcal 23 valent vaccine  0.5 mL Intramuscular Tomorrow-1000  . sertraline  150 mg Oral BH-q7a  . sodium chloride      . sodium chloride      . sodium chloride      . sodium polystyrene  60 g Oral Once  .  Tamsulosin HCl  0.4 mg Oral BH-q7a  . traZODone  50 mg Oral QHS  . vancomycin  1,250 mg Intravenous Q24H  . ziprasidone  40 mg Oral BID WC   Continuous:   . sodium chloride 125 mL/hr at 10/23/11 0340   [id number], alum & mag hydroxide-simeth  Assesment: He has pneumonia. He has mental retardation. He has a chronic indwelling Foley catheter. Principal Problem:  *HCAP (healthcare-associated pneumonia) Active Problems:  Mental retardation  BPH (benign prostatic hyperplasia)  SIRS (systemic inflammatory response syndrome)  ARF (acute renal failure)  Hyperkalemia  Anemia  Hyperglycemia  Abnormal urinalysis  Chronic indwelling foley catheter  Plan: I added protonix plan to continue his other treatments.    LOS: 1 day   Jarrah Seher L 10/23/2011, 8:32 AM

## 2011-10-23 NOTE — Progress Notes (Signed)
UR Chart Review Completed  

## 2011-10-24 LAB — BASIC METABOLIC PANEL
BUN: 21 mg/dL (ref 6–23)
Chloride: 105 mEq/L (ref 96–112)
Creatinine, Ser: 1.44 mg/dL — ABNORMAL HIGH (ref 0.50–1.35)
GFR calc Af Amer: 65 mL/min — ABNORMAL LOW (ref >90)
GFR calc non Af Amer: 56 mL/min — ABNORMAL LOW (ref >90)

## 2011-10-24 LAB — GLUCOSE, CAPILLARY
Glucose-Capillary: 262 mg/dL — ABNORMAL HIGH (ref 70–99)
Glucose-Capillary: 197 mg/dL — ABNORMAL HIGH (ref 70–99)

## 2011-10-24 MED ORDER — SODIUM CHLORIDE 0.9 % IJ SOLN
INTRAMUSCULAR | Status: AC
  Administered 2011-10-24: 10 mL
  Filled 2011-10-24: qty 3

## 2011-10-24 NOTE — Progress Notes (Signed)
Subjective: He had significant trouble with abdominal pain yesterday but then had at least 2 bowel movements and says he's better. His breathing seems to be doing better. Overall I think he has improved.  Objective: Vital signs in last 24 hours: Temp:  [98.1 F (36.7 C)-100.7 F (38.2 C)] 98.5 F (36.9 C) (02/20 0452) Pulse Rate:  [73-81] 73  (02/20 0452) Resp:  [18-20] 18  (02/20 0452) BP: (107-172)/(61-85) 172/85 mmHg (02/20 0452) SpO2:  [97 %-99 %] 99 % (02/20 0452) Weight change:  Last BM Date: 10/23/11  Intake/Output from previous day: 02/19 0701 - 02/20 0700 In: 2270 [P.O.:720; I.V.:1500; IV Piggyback:50] Out: [id number] [Urine:4050]  PHYSICAL EXAM General appearance: alert, cooperative, mild distress and With poor communication but he does communicate by occasional words and gestures Resp: rhonchi bilaterally Cardio: regular rate and rhythm, S1, S2 normal, no murmur, click, rub or gallop GI: Minimal tenderness but much improved Extremities: extremities normal, atraumatic, no cyanosis or edema  Lab Results:    Basic Metabolic Panel:  Basename 10/24/11 0532 10/23/11 0538  NA 136 135  K 4.0 4.4  CL 105 104  CO2 22 22  GLUCOSE 121* 198*  BUN 21 25*  CREATININE 1.44* 1.55*  CALCIUM 9.6 9.3  MG -- --  PHOS -- --   Liver Function Tests:  [facility] 10/23/11 0538 10/22/11 0419  AST 41* 46*  ALT 40 48  ALKPHOS 78 83  BILITOT 0.8 0.5  PROT 6.5 8.0  ALBUMIN 2.8* 4.0    Basename 10/22/11 0419  LIPASE 13  AMYLASE --   No results found for this basename: AMMONIA:2 in the last 72 hours CBC:  Basename 10/23/11 0538 10/22/11 0419  WBC 12.1* 10.1  NEUTROABS -- 9.1*  HGB 8.9* 10.5*  HCT 27.2* 31.8*  MCV 81.4 81.3  PLT 163 197   Cardiac Enzymes: No results found for this basename: CKTOTAL:3,CKMB:3,CKMBINDEX:3,TROPONINI:3 in the last 72 hours BNP: No results found for this basename: PROBNP:3 in the last 72 hours D-Dimer: No results found for this basename:  DDIMER:2 in the last 72 hours CBG:  Basename 10/24/11 0727 10/23/11 2108 10/23/11 1738 10/23/11 1648 10/23/11 1130 10/23/11 0730  GLUCAP 96 211* 118* 62* 389* 197*   Hemoglobin A1C:  Basename 10/22/11 0912  HGBA1C 8.0*   Fasting Lipid Panel: No results found for this basename: CHOL,HDL,LDLCALC,TRIG,CHOLHDL,LDLDIRECT in the last 72 hours Thyroid Function Tests: No results found for this basename: TSH,T4TOTAL,FREET4,T3FREE,THYROIDAB in the last 72 hours Anemia Panel: No results found for this basename: VITAMINB12,FOLATE,FERRITIN,TIBC,IRON,RETICCTPCT in the last 72 hours Coagulation: No results found for this basename: LABPROT:2,INR:2 in the last 72 hours Urine Drug Screen: Drugs of Abuse  No results found for this basename: labopia, cocainscrnur, labbenz, amphetmu, thcu, labbarb    Alcohol Level: No results found for this basename: ETH:2 in the last 72 hours Urinalysis:  Basename 10/22/11 0449  COLORURINE YELLOW  LABSPEC 1.015  PHURINE 6.0  GLUCOSEU >1000*  HGBUR LARGE*  BILIRUBINUR NEGATIVE  KETONESUR TRACE*  PROTEINUR NEGATIVE  UROBILINOGEN 0.2  NITRITE NEGATIVE  [place]. Labs:  ABGS No results found for this basename: PHART,PCO2,PO2ART,TCO2,HCO3 in the last 72 hours CULTURES Recent Results (from the past 240 hour(s))  CULTURE, BLOOD (ROUTINE X 2)     Status: Normal (Preliminary result)   Collection Time   10/22/11  4:19 AM      Component Value Range Status Comment   Specimen Description BLOOD DRAWN BY RN IV START   Final    Special  Requests BOTTLES DRAWN AEROBIC AND ANAEROBIC 6CC   Final    Culture NO GROWTH 1 DAY   Final    Report Status PENDING   Incomplete   CULTURE, BLOOD (ROUTINE X 2)     Status: Normal (Preliminary result)   Collection Time   10/22/11  4:27 AM      Component Value Range Status Comment   Specimen Description BLOOD LEFT ANTECUBITAL   Final    Special Requests BOTTLES DRAWN AEROBIC AND ANAEROBIC 6CC   Final    Culture  NO GROWTH 1 DAY   Final    Report Status PENDING   Incomplete   URINE CULTURE     Status: Normal   Collection Time   10/22/11  4:49 AM      Component Value Range Status Comment   Specimen Description URINE, CATHETERIZED   Final    Special Requests NONE   Final    Culture  Setup Time [id number]   Final    Colony Count NO GROWTH   Final    Culture NO GROWTH   Final    Report Status 10/23/2011 FINAL   Final   MRSA PCR SCREENING     Status: Normal   Collection Time   10/22/11  8:12 AM      Component Value Range Status Comment   MRSA by PCR NEGATIVE  NEGATIVE  Final    Studies/Results: No results found.  Medications:  Scheduled:   . bisacodyl  10 mg Rectal Once  . ceFEPime (MAXIPIME) IV  1 g Intravenous Q8H  . heparin  5,000 Units Subcutaneous Q8H  . insulin aspart  0-15 Units Subcutaneous TID WC  . insulin aspart  0-5 Units Subcutaneous QHS  . insulin glargine  20 Units Subcutaneous QHS  . levofloxacin (LEVAQUIN) IV  750 mg Intravenous Q24H  . lisinopril  10 mg Oral Daily  . pantoprazole  40 mg Oral Q1200  . pneumococcal 23 valent vaccine  0.5 mL Intramuscular Tomorrow-1000  . sertraline  150 mg Oral BH-q7a  . sodium phosphate  1 enema Rectal Once  . Tamsulosin HCl  0.4 mg Oral BH-q7a  . traZODone  50 mg Oral QHS  . vancomycin  1,250 mg Intravenous Q24H  . ziprasidone  40 mg Oral BID WC   Continuous:   . sodium chloride 125 mL/hr at 10/24/11 0315   [id number], alum & mag hydroxide-simeth, HYDROcodone-acetaminophen  Assesment: He has pneumonia. He had problems with his abdomen. He is much improved. He has a chronic indwelling Foley catheter. He is continuing antibiotic treatment and looks much better Principal Problem:  *HCAP (healthcare-associated pneumonia) Active Problems:  Mental retardation  BPH (benign prostatic hyperplasia)  SIRS (systemic inflammatory response syndrome)  ARF (acute renal failure)  Hyperkalemia  Anemia  Hyperglycemia  Abnormal  urinalysis  Chronic indwelling foley catheter    Plan: No change in treatments today we'll reevaluate tomorrow he may be getting close to being able to be discharged fairly soon    LOS: 2 days   Lenny Bouchillon L 10/24/2011, 8:42 AM

## 2011-10-24 NOTE — Progress Notes (Signed)
10/24/11 1529 Patient assisted up to chair this afternoon with two assist, tolerated well, some general weakness. Chair alarm on for safety. Nursing to monitor.

## 2011-10-24 NOTE — Progress Notes (Signed)
Spoke with Bethena Roys at [facility] and they are prepared to accept him back once medically stable- possibly soon per MD note. Will follow-  Eduard Clos, MSW, [place]

## 2011-10-25 LAB — GLUCOSE, CAPILLARY

## 2011-10-25 LAB — BASIC METABOLIC PANEL
BUN: 20 mg/dL (ref 6–23)
CO2: 20 mEq/L (ref 19–32)
GFR calc non Af Amer: 60 mL/min — ABNORMAL LOW (ref >90)
Glucose, Bld: 304 mg/dL — ABNORMAL HIGH (ref 70–99)
Potassium: 4.3 mEq/L (ref 3.5–5.1)

## 2011-10-25 MED ORDER — LEVOFLOXACIN 500 MG PO TABS
500.0000 mg | ORAL_TABLET | Freq: Every day | ORAL | Status: DC
  Administered 2011-10-25 – 2011-10-26 (×2): 500 mg via ORAL
  Filled 2011-10-25 (×2): qty 1

## 2011-10-25 MED ORDER — LEVOFLOXACIN 500 MG PO TABS: 750.0000 mg | ORAL_TABLET | Freq: Every day | ORAL | Status: DC

## 2011-10-25 MED ORDER — SODIUM CHLORIDE 0.9 % IJ SOLN
INTRAMUSCULAR | Status: AC
  Filled 2011-10-25: qty 3

## 2011-10-25 NOTE — Consult Note (Addendum)
ANTIBIOTIC CONSULT NOTE   Pharmacy Consult for Vancomycin, Levaquin, Cefepime  Indication: rule out pneumonia  No Known Allergies  Patient Measurements: Height: 6' (182.9 cm) Weight: 165 lb 2 oz (74.9 kg) IBW/kg (Calculated) : 77.6   Vital Signs: Temp: 98.7 F (37.1 C) (02/21 0547) BP: 170/76 mmHg (02/21 0547) Pulse Rate: 63  (02/21 0547) Intake/Output from previous day: 02/20 0701 - 02/21 0700 In: 4272.1 [P.O.:970; I.V.:2752.1; IV Piggyback:550] Out: 4700 [Urine:4700] Intake/Output from this shift: Total I/O In: 320 [P.O.:120; IV Piggyback:200] Out: -   Labs:  Basename 10/25/11 0520 10/24/11 0532 10/23/11 0538  WBC -- -- 12.1*  HGB -- -- 8.9*  PLT -- -- 163  LABCREA -- -- --  CREATININE 1.36* 1.44* 1.55*   Estimated Creatinine Clearance: 69.6 ml/min (by C-G formula based on Cr of 1.36).  Basename 10/25/11 0520  VANCOTROUGH 15.9  VANCOPEAK --  VANCORANDOM --  GENTTROUGH --  GENTPEAK --  GENTRANDOM --  TOBRATROUGH --  TOBRAPEAK --  TOBRARND --  AMIKACINPEAK --  AMIKACINTROU --  AMIKACIN --     Microbiology: Recent Results (from the past 720 hour(s))  URINE CULTURE     Status: Normal   Collection Time   10/06/11  7:30 PM      Component Value Range Status Comment   Specimen Description URINE, CATHETERIZED   Final    Special Requests Normal   Final    Culture  Setup Time [id number]   Final    Colony Count 75,000 COLONIES/ML   Final    Culture YEAST   Final    Report Status 10/09/2011 FINAL   Final   URINE CULTURE     Status: Normal   Collection Time   10/10/11  4:07 PM      Component Value Range Status Comment   Specimen Description URINE, CATHETERIZED   Final    Special Requests NONE   Final    Culture  Setup Time [id number]   Final    Colony Count >=100,000 COLONIES/ML   Final    Culture YEAST   Final    Report Status 10/12/2011 FINAL   Final   CULTURE, BLOOD (ROUTINE X 2)     Status: Normal (Preliminary result)   Collection Time   10/22/11   4:19 AM      Component Value Range Status Comment   Specimen Description BLOOD DRAWN BY RN IV START   Final    Special Requests BOTTLES DRAWN AEROBIC AND ANAEROBIC 6CC   Final    Culture NO GROWTH 3 DAYS   Final    Report Status PENDING   Incomplete   CULTURE, BLOOD (ROUTINE X 2)     Status: Normal (Preliminary result)   Collection Time   10/22/11  4:27 AM      Component Value Range Status Comment   Specimen Description BLOOD LEFT ANTECUBITAL   Final    Special Requests BOTTLES DRAWN AEROBIC AND ANAEROBIC 6CC   Final    Culture NO GROWTH 3 DAYS   Final    Report Status PENDING   Incomplete   URINE CULTURE     Status: Normal   Collection Time   10/22/11  4:49 AM      Component Value Range Status Comment   Specimen Description URINE, CATHETERIZED   Final    Special Requests NONE   Final    Culture  Setup Time [id number]   Final    Colony Count NO GROWTH   Final  Culture NO GROWTH   Final    Report Status 10/23/2011 FINAL   Final   MRSA PCR SCREENING     Status: Normal   Collection Time   10/22/11  8:12 AM      Component Value Range Status Comment   MRSA by PCR NEGATIVE  NEGATIVE  Final    Medical History: Past Medical History  Diagnosis Date  . Renal disorder   . Elective mutism   . Autism   . Hyperosmolar (nonketotic) coma   . Hyperglycemia   . Diabetes mellitus   . Hyponatremia   . Mental retardation   . Urinary retention   . BPH (benign prostatic hyperplasia)   . Gastroparesis   . Depression    Medications:  Scheduled:     . ceFEPime (MAXIPIME) IV  1 g Intravenous Q8H  . heparin  5,000 Units Subcutaneous Q8H  . insulin aspart  0-15 Units Subcutaneous TID WC  . insulin aspart  0-5 Units Subcutaneous QHS  . insulin glargine  20 Units Subcutaneous QHS  . levofloxacin (LEVAQUIN) IV  750 mg Intravenous Q24H  . lisinopril  10 mg Oral Daily  . pantoprazole  40 mg Oral Q1200  . sertraline  150 mg Oral BH-q7a  . sodium chloride      . sodium chloride      .  sodium phosphate  1 enema Rectal Once  . Tamsulosin HCl  0.4 mg Oral BH-q7a  . traZODone  50 mg Oral QHS  . vancomycin  1,250 mg Intravenous Q24H  . ziprasidone  40 mg Oral BID WC   Assessment: Vancomycin trough level on target  Goal of Therapy:  Vancomycin trough level 15-20 mcg/ml  Plan: Vancomycin 1250mg  iv q24hrs (maintenance dose) Cont current Rx (Levaquin changed to PO per protocol) Labs per protocol  Hart Robinsons A 10/25/2011,10:39 AM

## 2011-10-25 NOTE — Progress Notes (Signed)
NAME:  HEYWOOD, DABISH           ACCOUNT NO.:  [id number]  MEDICAL RECORD NO.:  [id number]  LOCATION:  A306                          FACILITY:  [facility]  PHYSICIAN:  Meztli Llanas L. Luan Pulling, M.D.DATE OF BIRTH:  1961/07/31  DATE OF PROCEDURE: DATE OF DISCHARGE:                                PROGRESS NOTE   Mr. Kingham is admitted with pneumonia.  He has also had some abdominal disturbance, it appeared to be related to constipation.  This morning, he is feeling better and has no new complaints.  He is more verbal than he has been.  PHYSICAL EXAMINATION:  VITAL SIGNS:  Shows that his blood pressure 170/76, which is up higher than it has been, respirations 20, pulse 63, temperature is 98.7. CHEST:  Clearing. ABDOMEN:  Soft.  Bowel sounds present and active. NEUROLOGICAL:  He is about the same.  He has an indwelling Foley catheter, which is long-term.  His electrolytes this morning show sodium is 134, his creatinine is 1.36.  ASSESSMENT:  He was admitted with healthcare-associated pneumonia and he is better.  He had some abdominal disturbance.  I would like one more day of IV antibiotics and probably discharge him back to his assisted living facility tomorrow.     Dailynn Nancarrow L. Luan Pulling, M.D.     ELH/MEDQ  D:  10/25/2011  T:  10/25/2011  Job:  [id number]

## 2011-10-25 NOTE — Progress Notes (Signed)
[id number] 

## 2011-10-25 NOTE — Progress Notes (Signed)
Inpatient Diabetes Program Recommendations  AACE/ADA: New Consensus Statement on Inpatient Glycemic Control (2009)  Target Ranges:  Prepandial:   less than 140 mg/dL      Peak postprandial:   less than 180 mg/dL (1-2 hours)      Critically ill patients:  140 - 180 mg/dL   Reason for Visit: Elevated glucose  Results for EDGAR, ALLEGRO (MRN [id number]) as of 10/25/2011 18:12  Ref. Range 10/25/2011 07:05 10/25/2011 11:24 10/25/2011 16:41  Glucose-Capillary Latest Range: 70-99 mg/dL 264 (H) 271 (H) 296 (H)    Inpatient Diabetes Program Recommendations Insulin - Basal: Increase to  pre-admission dose of Lantus of 40 units qhs

## 2011-10-26 MED ORDER — LEVOFLOXACIN 500 MG PO TABS: 500.0000 mg | ORAL_TABLET | Freq: Every day | ORAL | Status: AC

## 2011-10-26 MED ORDER — SULFAMETHOXAZOLE-TMP DS 800-160 MG PO TABS: 1.0000 | ORAL_TABLET | Freq: Every evening | ORAL | Status: DC

## 2011-10-26 NOTE — Progress Notes (Signed)
Subjective: He says he feels well. The changes recommended by the diabetes coordinator are noted but I am going to send him home today so I will not institute them. He has no new complaints. His abdomen is doing well. He is not short of breath.  Objective: Vital signs in last 24 hours: Temp:  [97.5 F (36.4 C)-97.9 F (36.6 C)] 97.6 F (36.4 C) (02/22 0536) Pulse Rate:  [65-77] 65  (02/22 0536) Resp:  [16-20] 20  (02/22 0536) BP: (109-186)/(61-81) 186/81 mmHg (02/22 0536) SpO2:  [97 %-99 %] 97 % (02/22 0536) Weight change:  Last BM Date: 10/25/11  Intake/Output from previous day: 02/21 0701 - 02/22 0700 In: [id number] [P.O.:600; I.V.:850; IV Piggyback:200] Out: 2525 [Urine:2525]  PHYSICAL EXAM General appearance: alert, cooperative and no distress Resp: clear to auscultation bilaterally Cardio: regular rate and rhythm, S1, S2 normal, no murmur, click, rub or gallop GI: soft, non-tender; bowel sounds normal; no masses,  no organomegaly Extremities: extremities normal, atraumatic, no cyanosis or edema  Lab Results:    Basic Metabolic Panel:  Basename 10/25/11 0520 10/24/11 0532  NA 134* 136  K 4.3 4.0  CL 103 105  CO2 20 22  GLUCOSE 304* 121*  BUN 20 21  CREATININE 1.36* 1.44*  CALCIUM 9.0 9.6  MG -- --  PHOS -- --   Liver Function Tests: No results found for this basename: AST:2,ALT:2,ALKPHOS:2,BILITOT:2,PROT:2,ALBUMIN:2 in the last 72 hours No results found for this basename: LIPASE:2,AMYLASE:2 in the last 72 hours No results found for this basename: AMMONIA:2 in the last 72 hours CBC: No results found for this basename: WBC:2,NEUTROABS:2,HGB:2,HCT:2,MCV:2,PLT:2 in the last 72 hours Cardiac Enzymes: No results found for this basename: CKTOTAL:3,CKMB:3,CKMBINDEX:3,TROPONINI:3 in the last 72 hours BNP: No results found for this basename: PROBNP:3 in the last 72 hours D-Dimer: No results found for this basename: DDIMER:2 in the last 72 hours CBG:  Basename 10/26/11  0724 10/25/11 2116 10/25/11 1641 10/25/11 1124 10/25/11 0705 10/24/11 2104  GLUCAP 170* 261* 296* 271* 264* 285*   Hemoglobin A1C: No results found for this basename: HGBA1C in the last 72 hours Fasting Lipid Panel: No results found for this basename: CHOL,HDL,LDLCALC,TRIG,CHOLHDL,LDLDIRECT in the last 72 hours Thyroid Function Tests: No results found for this basename: TSH,T4TOTAL,FREET4,T3FREE,THYROIDAB in the last 72 hours Anemia Panel: No results found for this basename: VITAMINB12,FOLATE,FERRITIN,TIBC,IRON,RETICCTPCT in the last 72 hours Coagulation: No results found for this basename: LABPROT:2,INR:2 in the last 72 hours Urine Drug Screen: Drugs of Abuse  No results found for this basename: labopia, cocainscrnur, labbenz, amphetmu, thcu, labbarb    Alcohol Level: No results found for this basename: ETH:2 in the last 72 hours Urinalysis: No results found for this basename: COLORURINE:2,APPERANCEUR:2,LABSPEC:2,PHURINE:2,GLUCOSEU:2,HGBUR:2,BILIRUBINUR:2,KETONESUR:2,PROTEINUR:2,UROBILINOGEN:2,NITRITE:2,LEUKOCYTESUR:2 in the last 72 hours Misc. Labs:  ABGS No results found for this basename: PHART,PCO2,PO2ART,TCO2,HCO3 in the last 72 hours CULTURES Recent Results (from the past 240 hour(s))  CULTURE, BLOOD (ROUTINE X 2)     Status: Normal (Preliminary result)   Collection Time   10/22/11  4:19 AM      Component Value Range Status Comment   Specimen Description BLOOD DRAWN BY RN IV START   Final    Special Requests BOTTLES DRAWN AEROBIC AND ANAEROBIC 6CC   Final    Culture NO GROWTH 3 DAYS   Final    Report Status PENDING   Incomplete   CULTURE, BLOOD (ROUTINE X 2)     Status: Normal (Preliminary result)   Collection Time   10/22/11  4:27 AM  Component Value Range Status Comment   Specimen Description BLOOD LEFT ANTECUBITAL   Final    Special Requests BOTTLES DRAWN AEROBIC AND ANAEROBIC 6CC   Final    Culture NO GROWTH 3 DAYS   Final    Report Status PENDING   Incomplete     URINE CULTURE     Status: Normal   Collection Time   10/22/11  4:49 AM      Component Value Range Status Comment   Specimen Description URINE, CATHETERIZED   Final    Special Requests NONE   Final    Culture  Setup Time [id number]   Final    Colony Count NO GROWTH   Final    Culture NO GROWTH   Final    Report Status 10/23/2011 FINAL   Final   MRSA PCR SCREENING     Status: Normal   Collection Time   10/22/11  8:12 AM      Component Value Range Status Comment   MRSA by PCR NEGATIVE  NEGATIVE  Final    Studies/Results: No results found.  Medications:  Prior to Admission:  Prescriptions prior to admission  Medication Sig Dispense Refill  . acetaminophen (TYLENOL) 325 MG tablet Take 650 mg by mouth every 6 (six) hours as needed.      . bisacodyl (DULCOLAX) 10 MG suppository Place 10 mg rectally as needed.      . fluconazole (DIFLUCAN) 100 MG tablet Take 100 mg by mouth daily. Finished 7 day course      . insulin aspart (NOVOLOG) 100 UNIT/ML injection Inject 15 Units into the skin 3 (three) times daily before meals. Per sliding scale instructions: If 150-200= 1 unit, 201-250= 2 units, 251-300= 3 units, 301-350= 4 units, 351-400= 5 units, Greater than 400= 6 units      . insulin aspart (NOVOLOG) 100 UNIT/ML injection Inject 15 Units into the skin 3 (three) times daily before meals. In addition to Sliding Scale Do NOT administer  for <70      . insulin glargine (LANTUS) 100 UNIT/ML injection Inject 40 Units into the skin at bedtime.       Marland Kitchen lisinopril (PRINIVIL,ZESTRIL) 10 MG tablet Take 10 mg by mouth every morning.       . ranitidine (ZANTAC) 150 MG capsule Take 150 mg by mouth 2 (two) times daily.        . sertraline (ZOLOFT) 50 MG tablet Take 150 mg by mouth every morning.       . sulfamethoxazole-trimethoprim (BACTRIM DS) 800-160 MG per tablet Take 1 tablet by mouth every evening.       . Tamsulosin HCl (FLOMAX) 0.4 MG CAPS Take 0.4 mg by mouth every morning.       . traZODone  (DESYREL) 50 MG tablet Take 50 mg by mouth at bedtime.        . ziprasidone (GEODON) 40 MG capsule Take 40 mg by mouth 2 (two) times daily with a meal.         Scheduled:   . heparin  5,000 Units Subcutaneous Q8H  . insulin aspart  0-15 Units Subcutaneous TID WC  . insulin aspart  0-5 Units Subcutaneous QHS  . insulin glargine  20 Units Subcutaneous QHS  . levofloxacin  500 mg Oral Daily  . lisinopril  10 mg Oral Daily  . pantoprazole  40 mg Oral Q1200  . sertraline  150 mg Oral BH-q7a  . sodium chloride      . sodium phosphate  1 enema Rectal Once  . Tamsulosin HCl  0.4 mg Oral BH-q7a  . traZODone  50 mg Oral QHS  . ziprasidone  40 mg Oral BID WC  . DISCONTD: ceFEPime (MAXIPIME) IV  1 g Intravenous Q8H  . DISCONTD: levofloxacin (LEVAQUIN) IV  750 mg Intravenous Q24H  . DISCONTD: levofloxacin  750 mg Oral Daily  . DISCONTD: vancomycin  1,250 mg Intravenous Q24H   Continuous:   . DISCONTD: sodium chloride 125 mL/hr at 10/25/11 0600   [id number], alum & mag hydroxide-simeth, HYDROcodone-acetaminophen  Assesment: He was admitted with healthcare associated pneumonia. He had some abdominal discomfort was probably related to constipation. He has diabetes. He has mental retardation. He is overall much improved and ready for discharge Principal Problem:  *HCAP (healthcare-associated pneumonia) Active Problems:  Mental retardation  BPH (benign prostatic hyperplasia)  SIRS (systemic inflammatory response syndrome)  ARF (acute renal failure)  Hyperkalemia  Anemia  Hyperglycemia  Abnormal urinalysis  Chronic indwelling foley catheter    Plan: Discharge today    LOS: 4 days   [name] L 10/26/2011, 8:31 AM

## 2011-10-26 NOTE — Discharge Summary (Signed)
Physician Discharge Summary  Patient ID: [name] MRN: [id number] DOB/AGE: [birth year] 50 y.o. Primary [place], MD, MD Admit date: 10/22/2011 Discharge date: 10/26/2011    Discharge Diagnoses:   Principal Problem:  *HCAP (healthcare-associated pneumonia) Active Problems:  Type II or unspecified type diabetes mellitus without mention of complication, not stated as uncontrolled  Mental retardation  BPH (benign prostatic hyperplasia)  SIRS (systemic inflammatory response syndrome)  ARF (acute renal failure)  Hyperkalemia  Anemia  Hyperglycemia  Abnormal urinalysis  Chronic indwelling foley catheter   Medication List  As of 10/26/2011  9:08 AM   TAKE these medications         acetaminophen 325 MG tablet   Commonly known as: TYLENOL   Take 650 mg by mouth every 6 (six) hours as needed.      bisacodyl 10 MG suppository   Commonly known as: DULCOLAX   Place 10 mg rectally as needed.      FLOMAX 0.4 MG Caps   Generic drug: Tamsulosin HCl   Take 0.4 mg by mouth every morning.      fluconazole 100 MG tablet   Commonly known as: DIFLUCAN   Take 100 mg by mouth daily. Finished 7 day course      insulin aspart 100 UNIT/ML injection   Commonly known as: novoLOG   Inject 15 Units into the skin 3 (three) times daily before meals. Per sliding scale instructions: If 150-200= 1 unit, 201-250= 2 units, 251-300= 3 units, 301-350= 4 units, 351-400= 5 units, Greater than 400= 6 units      insulin aspart 100 UNIT/ML injection   Commonly known as: novoLOG   Inject 15 Units into the skin 3 (three) times daily before meals. In addition to Sliding Scale  Do NOT administer  for <70      insulin glargine 100 UNIT/ML injection   Commonly known as: LANTUS   Inject 40 Units into the skin at bedtime.      levofloxacin 500 MG tablet   Commonly known as: LEVAQUIN   Take 1 tablet (500 mg total) by mouth daily.      lisinopril 10 MG tablet   Commonly known as:  PRINIVIL,ZESTRIL   Take 10 mg by mouth every morning.      ranitidine 150 MG capsule   Commonly known as: ZANTAC   Take 150 mg by mouth 2 (two) times daily.      sertraline 50 MG tablet   Commonly known as: ZOLOFT   Take 150 mg by mouth every morning.      sulfamethoxazole-trimethoprim 800-160 MG per tablet   Commonly known as: BACTRIM DS   Take 1 tablet by mouth every evening.      traZODone 50 MG tablet   Commonly known as: DESYREL   Take 50 mg by mouth at bedtime.      ziprasidone 40 MG capsule   Commonly known as: GEODON   Take 40 mg by mouth 2 (two) times daily with a meal.            Discharged Condition: Improved    Consults: None  Significant Diagnostic Studies: Dg Chest Port 1 View  10/22/2011  *RADIOLOGY REPORT*  Clinical Data: Chest pain and abdominal pain; lower back pain.  PORTABLE CHEST - 1 VIEW  Comparison: Chest radiograph performed 09/18/2011  Findings: The lungs are well-aerated.  Patchy right midlung airspace opacity raises concern for pneumonia.  The left lung now appears essentially clear.  There is no evidence of  pleural effusion or pneumothorax.  The cardiomediastinal silhouette is within normal limits.  No acute osseous abnormalities are seen.  IMPRESSION: Patchy right midlung airspace opacity raises concern for pneumonia.  Original Report Authenticated By: Santa Lighter, M.D.    Lab Results: Basic Metabolic Panel:  Basename 10/25/11 0520 10/24/11 0532  NA 134* 136  K 4.3 4.0  CL 103 105  CO2 20 22  GLUCOSE 304* 121*  BUN 20 21  CREATININE 1.36* 1.44*  CALCIUM 9.0 9.6  MG -- --  PHOS -- --   Liver Function Tests: No results found for this basename: AST:2,ALT:2,ALKPHOS:2,BILITOT:2,PROT:2,ALBUMIN:2 in the last 72 hours   CBC: No results found for this basename: WBC:2,NEUTROABS:2,HGB:2,HCT:2,MCV:2,PLT:2 in the last 72 hours  Recent Results (from the past 240 hour(s))  CULTURE, BLOOD (ROUTINE X 2)     Status: Normal (Preliminary  result)   Collection Time   10/22/11  4:19 AM      Component Value Range Status Comment   Specimen Description BLOOD DRAWN BY RN IV START   Final    Special Requests BOTTLES DRAWN AEROBIC AND ANAEROBIC 6CC   Final    Culture NO GROWTH 3 DAYS   Final    Report Status PENDING   Incomplete   CULTURE, BLOOD (ROUTINE X 2)     Status: Normal (Preliminary result)   Collection Time   10/22/11  4:27 AM      Component Value Range Status Comment   Specimen Description BLOOD LEFT ANTECUBITAL   Final    Special Requests BOTTLES DRAWN AEROBIC AND ANAEROBIC 6CC   Final    Culture NO GROWTH 3 DAYS   Final    Report Status PENDING   Incomplete   URINE CULTURE     Status: Normal   Collection Time   10/22/11  4:49 AM      Component Value Range Status Comment   Specimen Description URINE, CATHETERIZED   Final    Special Requests NONE   Final    Culture  Setup Time [id number]   Final    Colony Count NO GROWTH   Final    Culture NO GROWTH   Final    Report Status 10/23/2011 FINAL   Final   MRSA PCR SCREENING     Status: Normal   Collection Time   10/22/11  8:12 AM      Component Value Range Status Comment   MRSA by PCR NEGATIVE  NEGATIVE  Final      Hospital Course: He was admitted with pneumonia. He lives in a facility so it was felt to be healthcare associated. He had some problems with abdominal discomfort that seemed to be related to constipation. He improved as far as his breathing was concerned over the next several days to the point that he was able to be discharged. His abdominal pain resolved after he had bowel movements.  Discharge Exam: Blood pressure 186/81, pulse 65, temperature 97.6 F (36.4 C), temperature source Oral, resp. rate 20, height 6' (1.829 m), weight 74.9 kg (165 lb 2 oz), SpO2 97.00%. He was able to respond with single word phrases. His chest was much clearer. His abdomen was soft.  Disposition: Discharge back to his facility  Discharge Orders    Future Orders Please  Complete By Expires   Discharge patient         Follow-up Information    Follow up with [facility], MD .         Signed: Alonza Bogus Pager [phone]  10/26/2011, 9:08 AM

## 2011-10-26 NOTE — Progress Notes (Signed)
Patient for d/c today back to ALF bed at [facility]. Patient agreeable to this plan- plan transfer via ALF transportation.   Eduard Clos, MSW, [place]

## 2011-10-27 LAB — CULTURE, BLOOD (ROUTINE X 2): Culture: NO GROWTH

## 2011-11-07 ENCOUNTER — Emergency Department (HOSPITAL_COMMUNITY)
Admission: EM | Admit: 2011-11-07 | Discharge: 2011-11-08 | Disposition: A | Discharge status: Skilled Nursing Facility | Payer: Medicaid Other | Attending: Emergency Medicine | Admitting: Emergency Medicine

## 2011-11-07 ENCOUNTER — Emergency Department (HOSPITAL_COMMUNITY)
Admission: EM | Admit: 2011-11-07 | Discharge: 2011-11-07 | Disposition: A | Discharge status: Nursing Home (Includes ICF) | Payer: Medicaid Other | Source: Home / Self Care | Attending: Emergency Medicine | Admitting: Emergency Medicine

## 2011-11-07 ENCOUNTER — Encounter (HOSPITAL_COMMUNITY): Payer: Self-pay | Admitting: Emergency Medicine

## 2011-11-07 ENCOUNTER — Encounter (HOSPITAL_COMMUNITY): Payer: Self-pay | Admitting: *Deleted

## 2011-11-07 ENCOUNTER — Emergency Department (HOSPITAL_COMMUNITY): Payer: Medicaid Other

## 2011-11-07 DIAGNOSIS — R109 Unspecified abdominal pain: Secondary | ICD-10-CM | POA: Insufficient documentation

## 2011-11-07 DIAGNOSIS — F329 Major depressive disorder, single episode, unspecified: Secondary | ICD-10-CM | POA: Insufficient documentation

## 2011-11-07 DIAGNOSIS — K5900 Constipation, unspecified: Secondary | ICD-10-CM | POA: Insufficient documentation

## 2011-11-07 DIAGNOSIS — F840 Autistic disorder: Secondary | ICD-10-CM | POA: Insufficient documentation

## 2011-11-07 DIAGNOSIS — Z794 Long term (current) use of insulin: Secondary | ICD-10-CM | POA: Insufficient documentation

## 2011-11-07 DIAGNOSIS — E1169 Type 2 diabetes mellitus with other specified complication: Secondary | ICD-10-CM | POA: Insufficient documentation

## 2011-11-07 DIAGNOSIS — K6289 Other specified diseases of anus and rectum: Secondary | ICD-10-CM | POA: Insufficient documentation

## 2011-11-07 DIAGNOSIS — R6250 Unspecified lack of expected normal physiological development in childhood: Secondary | ICD-10-CM | POA: Insufficient documentation

## 2011-11-07 DIAGNOSIS — E119 Type 2 diabetes mellitus without complications: Secondary | ICD-10-CM | POA: Insufficient documentation

## 2011-11-07 DIAGNOSIS — F3289 Other specified depressive episodes: Secondary | ICD-10-CM | POA: Insufficient documentation

## 2011-11-07 DIAGNOSIS — Z79899 Other long term (current) drug therapy: Secondary | ICD-10-CM | POA: Insufficient documentation

## 2011-11-07 DIAGNOSIS — F79 Unspecified intellectual disabilities: Secondary | ICD-10-CM | POA: Insufficient documentation

## 2011-11-07 DIAGNOSIS — R739 Hyperglycemia, unspecified: Secondary | ICD-10-CM

## 2011-11-07 LAB — GLUCOSE, CAPILLARY: Glucose-Capillary: 233 mg/dL — ABNORMAL HIGH (ref 70–99)

## 2011-11-07 LAB — BASIC METABOLIC PANEL
BUN: 57 mg/dL — ABNORMAL HIGH (ref 6–23)
Chloride: 97 mEq/L (ref 96–112)
Creatinine, Ser: 2.31 mg/dL — ABNORMAL HIGH (ref 0.50–1.35)
GFR calc Af Amer: 36 mL/min — ABNORMAL LOW (ref >90)
GFR calc non Af Amer: 31 mL/min — ABNORMAL LOW (ref >90)

## 2011-11-07 LAB — CBC
HCT: 30.3 % — ABNORMAL LOW (ref 39.0–52.0)
Hemoglobin: 10 g/dL — ABNORMAL LOW (ref 13.0–17.0)
MCH: 26.6 pg (ref 26.0–34.0)
MCHC: 33 g/dL (ref 30.0–36.0)
MCV: 80.6 fL (ref 78.0–100.0)

## 2011-11-07 LAB — DIFFERENTIAL
Basophils Relative: 0 % (ref 0–1)
Eosinophils Absolute: 0.1 10*3/uL (ref 0.0–0.7)
Monocytes Absolute: 0.3 10*3/uL (ref 0.1–1.0)
Monocytes Relative: 4 % (ref 3–12)

## 2011-11-07 MED ORDER — SODIUM CHLORIDE 0.9 % IV SOLN
INTRAVENOUS | Status: DC
  Administered 2011-11-07: 4.8 [IU]/h via INTRAVENOUS
  Filled 2011-11-07: qty 1

## 2011-11-07 MED ORDER — DEXTROSE-NACL 5-0.45 % IV SOLN: INTRAVENOUS | Status: DC

## 2011-11-07 MED ORDER — BISACODYL 10 MG RE SUPP
10.0000 mg | Freq: Once | RECTAL | Status: AC
  Administered 2011-11-07: 10 mg via RECTAL
  Filled 2011-11-07: qty 1

## 2011-11-07 MED ORDER — SODIUM CHLORIDE 0.9 % IV BOLUS (SEPSIS)
1000.0000 mL | Freq: Once | INTRAVENOUS | Status: AC
  Administered 2011-11-07: 1000 mL via INTRAVENOUS

## 2011-11-07 MED ORDER — DEXTROSE 50 % IV SOLN: 25.0000 mL | INTRAVENOUS | Status: DC | PRN

## 2011-11-07 MED ORDER — SODIUM CHLORIDE 0.9 % IV SOLN: INTRAVENOUS | Status: DC

## 2011-11-07 MED ORDER — INSULIN REGULAR BOLUS VIA INFUSION
0.0000 [IU] | Freq: Three times a day (TID) | INTRAVENOUS | Status: DC
  Administered 2011-11-07: 10 [IU] via INTRAVENOUS
  Filled 2011-11-07: qty 10

## 2011-11-07 MED ORDER — POLYETHYLENE GLYCOL 3350 17 GM/SCOOP PO POWD: 17.0000 g | Freq: Every day | ORAL | Status: AC

## 2011-11-07 NOTE — ED Provider Notes (Signed)
History   This chart was scribed for Mervin Kung, MD by Carolyne Littles. The patient was seen in room APA06/APA06. Patient's care was started at [place].    CSN: [id number]  Arrival date & time 11/07/11  0745   First MD Initiated Contact with Patient 11/07/11 0813      Chief Complaint  Patient presents with  . Hyperglycemia    (Consider location/radiation/quality/duration/timing/severity/associated sxs/prior treatment) HPI [name] is a 51 y.o. male who presents to the Emergency Department from [facility] complaining of constant moderate abdominal pain onset 2 days ago with associated constipation per [facility] staff. [facility] Staff also notes that patient the patient presented with hyperglycemia this morning after being given 21 units of Insulin as his blood glucose level was measured at 591. Patient denies nausea, vomiting, chest pain, HA. Patient with h/o diabetes, renal disorder, elective mutism, autism, mental retardation, gastroparesis.   PCP- Legrand Rams  Past Medical History  Diagnosis Date  . Renal disorder   . Elective mutism   . Autism   . Hyperosmolar (nonketotic) coma   . Hyperglycemia   . Diabetes mellitus   . Hyponatremia   . Mental retardation   . Urinary retention   . BPH (benign prostatic hyperplasia)   . Gastroparesis   . Depression     History reviewed. No pertinent past surgical history.  History reviewed. No pertinent family history.  History  Substance Use Topics  . Smoking status: Never Smoker   . Smokeless tobacco: Not on file  . Alcohol Use: No      Review of Systems  Constitutional: Negative for fever and chills.       Hyperglycemia.   HENT: Negative for rhinorrhea and neck pain.   Eyes: Negative for pain.  Respiratory: Negative for cough and shortness of breath.   Cardiovascular: Negative for chest pain.  Gastrointestinal: Positive for abdominal pain and constipation. Negative for nausea,  vomiting and diarrhea.  Genitourinary: Negative for dysuria.  Musculoskeletal: Negative for back pain.  Skin: Negative for rash.  Neurological: Negative for dizziness and weakness.     Allergies  Review of patient's allergies indicates no known allergies.  Home Medications   Current Outpatient Rx  Name Route Sig Dispense Refill  . INSULIN ASPART 100 UNIT/ML [place] SOLN Subcutaneous Inject 15 Units into the skin 3 (three) times daily before meals. Per sliding scale instructions: If 150-200= 1 unit, 201-250= 2 units, 251-300= 3 units, 301-350= 4 units, 351-400= 5 units, Greater than 400= 6 units    . INSULIN ASPART 100 UNIT/ML  SOLN Subcutaneous Inject 15 Units into the skin 3 (three) times daily before meals. In addition to Sliding Scale Do NOT administer  for <70    . LEVOFLOXACIN 500 MG PO TABS Oral Take 500 mg by mouth daily.    Marland Kitchen LISINOPRIL 10 MG PO TABS Oral Take 10 mg by mouth every morning.     Marland Kitchen RANITIDINE HCL 150 MG PO CAPS Oral Take 150 mg by mouth 2 (two) times daily.      . SERTRALINE HCL 50 MG PO TABS Oral Take 150 mg by mouth every morning.     . SULFAMETHOXAZOLE-TMP DS 800-160 MG PO TABS Oral Take 1 tablet by mouth every evening. 30 tablet 1  . TAMSULOSIN HCL 0.4 MG PO CAPS Oral Take 0.4 mg by mouth every morning.     . TRAZODONE HCL 50 MG PO TABS Oral Take 50 mg by mouth at bedtime.      Marland Kitchen  ZIPRASIDONE HCL 40 MG PO CAPS Oral Take 40 mg by mouth 2 (two) times daily with a meal.      . ACETAMINOPHEN 325 MG PO TABS Oral Take 650 mg by mouth every 6 (six) hours as needed.    Marland Kitchen BISACODYL 10 MG RE SUPP Rectal Place 10 mg rectally as needed.    . INSULIN GLARGINE 100 UNIT/ML [place] SOLN Subcutaneous Inject 40 Units into the skin at bedtime.       BP 131/90  Pulse 84  Temp(Src) 98.1 F (36.7 C) (Oral)  Resp 20  SpO2 100%  Physical Exam  Nursing note and vitals reviewed. Constitutional: He is oriented to person, place, and time. He appears well-developed and well-nourished. No  distress.  HENT:  Head: Normocephalic and atraumatic.  Mouth/Throat: Oropharynx is clear and moist.       Mucous membranes moist.   Eyes: Conjunctivae and EOM are normal. Pupils are equal, round, and reactive to light.  Neck: Neck supple. No tracheal deviation present.  Cardiovascular: Normal rate, regular rhythm and intact distal pulses.  Exam reveals no gallop and no friction rub.   No murmur heard. Pulmonary/Chest: Effort normal and breath sounds normal. No respiratory distress. He has no wheezes. He has no rales.  Abdominal: Soft. Bowel sounds are normal. He exhibits no distension. There is no tenderness. There is no rebound and no guarding.  Genitourinary:       Catheter in place.   Musculoskeletal: Normal range of motion. He exhibits no edema and no tenderness.  Neurological: He is alert and oriented to person, place, and time. He has normal strength. No cranial nerve deficit or sensory deficit. He displays no seizure activity.  Skin: Skin is warm and dry. No rash noted. He is not diaphoretic.  Psychiatric: He has a normal mood and affect. His behavior is normal.    ED Course  Procedures (including critical care time)  DIAGNOSTIC STUDIES: Oxygen Saturation is 100% on room air, normal by my interpretation.    COORDINATION OF CARE: 8:28AM- Patient informed of current plan for treatment and evaluation. 9:10AM- Mervin Kung, MD informed of patient's blood glucose measure of 428.  10:13AM- Mervin Kung, MD informed patients blood glucose level now 145.    Results for orders placed during the hospital encounter of 11/07/11  CBC      Component Value Range   WBC 6.9  4.0 - 10.5 (K/uL)   RBC 3.76 (*) 4.22 - 5.81 (MIL/uL)   Hemoglobin 10.0 (*) 13.0 - 17.0 (g/dL)   HCT 30.3 (*) 39.0 - 52.0 (%)   MCV 80.6  78.0 - 100.0 (fL)   MCH 26.6  26.0 - 34.0 (pg)   MCHC 33.0  30.0 - 36.0 (g/dL)   RDW 13.7  11.5 - 15.5 (%)   Platelets 209  150 - 400 (K/uL)  DIFFERENTIAL       Component Value Range   Neutrophils Relative 76  43 - 77 (%)   Neutro Abs 5.2  1.7 - 7.7 (K/uL)   Lymphocytes Relative 18  12 - 46 (%)   Lymphs Abs 1.2  0.7 - 4.0 (K/uL)   Monocytes Relative 4  3 - 12 (%)   Monocytes Absolute 0.3  0.1 - 1.0 (K/uL)   Eosinophils Relative 2  0 - 5 (%)   Eosinophils Absolute 0.1  0.0 - 0.7 (K/uL)   Basophils Relative 0  0 - 1 (%)   Basophils Absolute 0.0  0.0 - 0.1 (K/uL)  BASIC METABOLIC PANEL      Component Value Range   Sodium 131 (*) 135 - 145 (mEq/L)   Potassium 4.7  3.5 - 5.1 (mEq/L)   Chloride 97  96 - 112 (mEq/L)   CO2 16 (*) 19 - 32 (mEq/L)   Glucose, Bld 428 (*) 70 - 99 (mg/dL)   BUN 57 (*) 6 - 23 (mg/dL)   Creatinine, Ser 2.31 (*) 0.50 - 1.35 (mg/dL)   Calcium 9.8  8.4 - 10.5 (mg/dL)   GFR calc non Af Amer 31 (*) >90 (mL/min)   GFR calc Af Amer 36 (*) >90 (mL/min)  GLUCOSE, CAPILLARY      Component Value Range   Glucose-Capillary 145 (*) 70 - 99 (mg/dL)  GLUCOSE, CAPILLARY      Component Value Range   Glucose-Capillary 89  70 - 99 (mg/dL)  GLUCOSE, CAPILLARY      Component Value Range   Glucose-Capillary 123 (*) 70 - 99 (mg/dL)  GLUCOSE, CAPILLARY      Component Value Range   Glucose-Capillary 233 (*) 70 - 99 (mg/dL)    No results found.   1. Hyperglycemia       MDM  Arrived markedly hyperglycemic, with the acidosis at 16. However patient clinically very stable and started on glucose stabilized her and blood sugar rapidly came down from over 500 to nursing home down to 123. She had received fluids at this point the glucose stabilize her was stopped from an insulin drip standpoint he also received 10 units of IV insulin push prior. And patient was fed which he completed without any difficulty no persistent nausea and vomiting and blood sugar slowly increased to 33 white cell at discharge she was in no acute distress. Despite the appearance of DKA clinically mild patient looking very good when discharged back to facility, and  should be fine as long as blood sugars remain controlled.         I personally performed the services described in this documentation, which was scribed in my presence. The recorded information has been reviewed and considered.     Mervin Kung, MD 11/07/11 [phone]

## 2011-11-07 NOTE — ED Notes (Signed)
Discharge instructions reviewed with pt, questions answered. Pt verbalized understanding.  

## 2011-11-07 NOTE — Discharge Instructions (Signed)
Blood sugars controlled in the emergency department came down to the 100s and patient given food now currently 233. Return as needed for hyperglycemia.

## 2011-11-07 NOTE — ED Notes (Signed)
Patient is still in pain.

## 2011-11-07 NOTE — ED Notes (Signed)
Pt from [facility]. Pt is alert/orieted. Pt normally mute. Pt c/o pressure to bottom area and says it is because he is constipated. No bm x 2 days. PF called ems due to high bs. ems read high.  Received 21 units of insulin at PF and then got bs of 591. Has indwelling cath which was emptied pta. Pt says he feels tired. nad at this itme. Iv en route.

## 2011-11-07 NOTE — ED Notes (Signed)
Patient still initiates that his bottom hurts

## 2011-11-07 NOTE — ED Notes (Signed)
Pt moaning and points to bottom area. edp aware.

## 2011-11-09 NOTE — ED Provider Notes (Signed)
Medical screening examination/treatment/procedure(s) were performed by non-physician practitioner and as supervising physician I was immediately available for consultation/collaboration.   Julianne Rice, MD 11/09/11 1600

## 2011-11-09 NOTE — ED Provider Notes (Signed)
History     CSN: [id number]  Arrival date & time 11/07/11  2044   First MD Initiated Contact with Patient 11/07/11 2049      Chief Complaint  Patient presents with  . Abdominal Pain  . Constipation    (Consider location/radiation/quality/duration/timing/severity/associated sxs/prior treatment) Patient is a 51 y.o. male presenting with constipation. The history is limited by a language barrier (Patient has "elective mutism", difficulty verbalizing complaint.  Does respond appropriately to yes and no questions.).  Constipation  The current episode started yesterday. The onset was gradual. The problem occurs continuously. The problem has been gradually worsening. The pain is moderate (Patient has low abdominal cramping and rectal pain). The stool is described as hard. Prior successful therapies include stool softeners. Ineffective treatments: Was given bisacodyl supp prior to arrival with no relief. Associated symptoms include rectal pain. Pertinent negatives include no fever, no abdominal pain, no nausea, no chest pain, no headaches and no rash. Associated symptoms comments: Abdominal cramping. He has been behaving normally. He has been eating and drinking normally. His past medical history is significant for developmental delay. His past medical history does not include recent change in diet. Recently, medical care has been given at this facility (He was seen here early this morning for hyperglycemia.).    Past Medical History  Diagnosis Date  . Renal disorder   . Elective mutism   . Autism   . Hyperosmolar (nonketotic) coma   . Hyperglycemia   . Diabetes mellitus   . Hyponatremia   . Mental retardation   . Urinary retention   . BPH (benign prostatic hyperplasia)   . Gastroparesis   . Depression     History reviewed. No pertinent past surgical history.  History reviewed. No pertinent family history.  History  Substance Use Topics  . Smoking status: Never Smoker   . Smokeless  tobacco: Not on file  . Alcohol Use: No      Review of Systems  Constitutional: Negative for fever.  HENT: Negative for congestion, sore throat and neck pain.   Eyes: Negative.   Respiratory: Negative for chest tightness and shortness of breath.   Cardiovascular: Negative for chest pain.  Gastrointestinal: Positive for constipation and rectal pain. Negative for nausea, abdominal pain, abdominal distention and anal bleeding.  Genitourinary: Negative.   Musculoskeletal: Negative for joint swelling and arthralgias.  Skin: Negative.  Negative for rash and wound.  Neurological: Negative for dizziness, weakness, light-headedness, numbness and headaches.  Hematological: Negative.   Psychiatric/Behavioral: Negative.     Allergies  Review of patient's allergies indicates no known allergies.  Home Medications   Current Outpatient Rx  Name Route Sig Dispense Refill  . INSULIN ASPART 100 UNIT/ML [place] SOLN Subcutaneous Inject 15 Units into the skin 3 (three) times daily before meals. Per sliding scale instructions: If 150-200= 1 unit, 201-250= 2 units, 251-300= 3 units, 301-350= 4 units, 351-400= 5 units, Greater than 400= 6 units    . INSULIN GLARGINE 100 UNIT/ML [place] SOLN Subcutaneous Inject 40 Units into the skin at bedtime.     Marland Kitchen LISINOPRIL 10 MG PO TABS Oral Take 10 mg by mouth every morning.     Marland Kitchen RANITIDINE HCL 150 MG PO CAPS Oral Take 150 mg by mouth 2 (two) times daily.      . SERTRALINE HCL 50 MG PO TABS Oral Take 150 mg by mouth every morning.     . SULFAMETHOXAZOLE-TMP DS 800-160 MG PO TABS Oral Take 1 tablet by mouth every  evening. 30 tablet 1  . TAMSULOSIN HCL 0.4 MG PO CAPS Oral Take 0.4 mg by mouth every morning.     . TRAZODONE HCL 50 MG PO TABS Oral Take 50 mg by mouth at bedtime.      Marland Kitchen ZIPRASIDONE HCL 40 MG PO CAPS Oral Take 40 mg by mouth 2 (two) times daily with a meal.      . ACETAMINOPHEN 325 MG PO TABS Oral Take 650 mg by mouth every 6 (six) hours as needed.    Marland Kitchen  BISACODYL 10 MG RE SUPP Rectal Place 10 mg rectally as needed.    Marland Kitchen LEVOFLOXACIN 500 MG PO TABS Oral Take 500 mg by mouth daily.    Marland Kitchen POLYETHYLENE GLYCOL 3350 PO POWD Oral Take 17 g by mouth daily. 527 g 0    BP 153/98  Pulse 138  Temp(Src) 97.4 F (36.3 C) (Axillary)  Resp 22  SpO2 94%  Physical Exam  Nursing note and vitals reviewed. Constitutional: He is oriented to person, place, and time. He appears well-developed and well-nourished.  HENT:  Head: Normocephalic and atraumatic.  Eyes: Conjunctivae are normal.  Neck: Normal range of motion.  Cardiovascular: Normal rate, regular rhythm, normal heart sounds and intact distal pulses.   Pulmonary/Chest: Effort normal and breath sounds normal. He has no wheezes.  Abdominal: Soft. Bowel sounds are normal. He exhibits no distension. There is no tenderness. There is no rebound.  Genitourinary:       Soft stool at distal rectum,  Higher hard ,  Possible impacted stool felt higher up in rectum.    Musculoskeletal: Normal range of motion.  Neurological: He is alert and oriented to person, place, and time.  Skin: Skin is warm and dry.  Psychiatric: He has a normal mood and affect.    ED Course  Procedures (including critical care time)  Labs Reviewed - No data to display Dg Abd 2 Views  11/07/2011  *RADIOLOGY REPORT*  Clinical Data: Abdominal pain  ABDOMEN - 2 VIEW  Comparison: 03/24/2011  Findings: Negative for bowel obstruction.  No pneumoperitoneum is identified.  No renal calculi.  No acute bony abnormality.  IMPRESSION: Negative  Original Report Authenticated By: Truett Perna, M.D.     1. Constipation     Patient given dulcolax rectal suppository,  And produced a large stool with complete resolution of sx by time of dc back to his rest home facility.   Hen[place], [place] 11/09/11 1549

## 2011-11-13 ENCOUNTER — Encounter (HOSPITAL_COMMUNITY): Payer: Self-pay

## 2011-11-13 ENCOUNTER — Emergency Department (HOSPITAL_COMMUNITY)
Admission: EM | Admit: 2011-11-13 | Discharge: 2011-11-13 | Disposition: A | Discharge status: Home / Self Care | Payer: Medicaid Other | Attending: Emergency Medicine | Admitting: Emergency Medicine

## 2011-11-13 DIAGNOSIS — F840 Autistic disorder: Secondary | ICD-10-CM | POA: Insufficient documentation

## 2011-11-13 DIAGNOSIS — F79 Unspecified intellectual disabilities: Secondary | ICD-10-CM | POA: Insufficient documentation

## 2011-11-13 DIAGNOSIS — E119 Type 2 diabetes mellitus without complications: Secondary | ICD-10-CM | POA: Insufficient documentation

## 2011-11-13 DIAGNOSIS — Z794 Long term (current) use of insulin: Secondary | ICD-10-CM | POA: Insufficient documentation

## 2011-11-13 DIAGNOSIS — K5900 Constipation, unspecified: Secondary | ICD-10-CM | POA: Insufficient documentation

## 2011-11-13 DIAGNOSIS — R109 Unspecified abdominal pain: Secondary | ICD-10-CM

## 2011-11-13 HISTORY — DX: Pneumonia, unspecified organism: J18.9

## 2011-11-13 HISTORY — DX: Urinary tract infection, site not specified: N39.0

## 2011-11-13 LAB — GLUCOSE, CAPILLARY: Glucose-Capillary: 228 mg/dL — ABNORMAL HIGH (ref 70–99)

## 2011-11-13 MED ORDER — HYOSCYAMINE SULFATE 0.125 MG PO TABS
0.2500 mg | ORAL_TABLET | Freq: Once | ORAL | Status: AC
  Administered 2011-11-13: 0.25 mg via ORAL
  Filled 2011-11-13 (×2): qty 1

## 2011-11-13 MED ORDER — HYOSCYAMINE SULFATE 0.125 MG SL SUBL: SUBLINGUAL_TABLET | SUBLINGUAL | Status: DC

## 2011-11-13 MED ORDER — POLYETHYLENE GLYCOL 3350 17 GM/SCOOP PO POWD: 17.0000 g | Freq: Every day | ORAL | Status: DC

## 2011-11-13 NOTE — Discharge Instructions (Signed)
Constipation in Adults Constipation is having fewer than 2 bowel movements per week. Usually, the stools are hard. As we grow older, constipation is more common. If you try to fix constipation with laxatives, the problem may get worse. This is because laxatives taken over a long period of time make the colon muscles weaker. A low-fiber diet, not taking in enough fluids, and taking some medicines may make these problems worse. MEDICATIONS THAT MAY CAUSE CONSTIPATION  Water pills (diuretics).   Calcium channel blockers (used to control blood pressure and for the heart).   Certain pain medicines (narcotics).   Anticholinergics.   Anti-inflammatory agents.   Antacids that contain aluminum.  DISEASES THAT CONTRIBUTE TO CONSTIPATION  Diabetes.   Parkinson's disease.   Dementia.   Stroke.   Depression.   Illnesses that cause problems with salt and water metabolism.  HOME CARE INSTRUCTIONS   Constipation is usually best cared for without medicines. Increasing dietary fiber and eating more fruits and vegetables is the best way to manage constipation.   Slowly increase fiber intake to 25 to 38 grams per day. Whole grains, fruits, vegetables, and legumes are good sources of fiber. A dietitian can further help you incorporate high-fiber foods into your diet.   Drink enough water and fluids to keep your urine clear or pale yellow.   A fiber supplement may be added to your diet if you cannot get enough fiber from foods.   Increasing your activities also helps improve regularity.   Suppositories, as suggested by your caregiver, will also help. If you are using antacids, such as aluminum or calcium containing products, it will be helpful to switch to products containing magnesium if your caregiver says it is okay.   If you have been given a liquid injection (enema) today, this is only a temporary measure. It should not be relied on for treatment of longstanding (chronic) constipation.    Stronger measures, such as magnesium sulfate, should be avoided if possible. This may cause uncontrollable diarrhea. Using magnesium sulfate may not allow you time to make it to the bathroom.  SEEK IMMEDIATE MEDICAL CARE IF:   There is bright red blood in the stool.   The constipation stays for more than 4 days.   There is belly (abdominal) or rectal pain.   You do not seem to be getting better.   You have any questions or concerns.  MAKE SURE YOU:   Understand these instructions.   Will watch your condition.   Will get help right away if you are not doing well or get worse.  Document Released: 05/18/2004 Document Revised: 08/09/2011 Document Reviewed: 07/24/2011 [facility] Patient Information 2012 [place], [place].   I strongly encourage you start using the miralax that you were prescribed during your visit here 4 days ago.  You may also use the medicine prescribed for abdominal cramping,  But use this medicine sparingly.  Call Dr. Legrand Rams for a recheck if you continue to have symptoms.

## 2011-11-13 NOTE — ED Notes (Signed)
Denies vomiting or diarrhea.  LBM was yesterday but says was very small.

## 2011-11-13 NOTE — ED Notes (Signed)
Pt DC to [facility].  Pt left via wc with urinary bag in place.  Caregiver given instructions and prescriptions in hand and verbalized understanding.

## 2011-11-13 NOTE — ED Provider Notes (Signed)
Medical screening examination/treatment/procedure(s) were conducted as a shared visit with non-physician practitioner(s) and myself.  I personally evaluated the patient during the encounter.  51yM with abdominal pain. Pt with multiple ER evaluations for similar. Abdomen benign on my exam. History is somewhat limited given what I suspect to be mild MR but very low suspicion for acute emergent surgical process. Feel that labs and imaging likely to be very low yield. Will give trial on bentyl.   Virgel Manifold, MD 11/13/11 [phone]

## 2011-11-13 NOTE — ED Notes (Signed)
[facility] called and report given.  Rep says they will be at ER in "a few minutes"

## 2011-11-13 NOTE — ED Provider Notes (Signed)
History     CSN: [id number]  Arrival date & time 11/13/11  [id number]   First MD Initiated Contact with Patient 11/13/11 [phone]      Chief Complaint  Patient presents with  . Abdominal Pain    (Consider location/radiation/quality/duration/timing/severity/associated sxs/prior treatment) HPI Comments: Patient presents for lower abdominal and rectal pain which started today.  He was seen here 4 days ago for similar symptoms at which time he had a fecal impaction resolved with Dulcolax suppository.  He was prescribed MiraLax, but states he is not currently taking this.  His last bowel movement was this morning, half a small hard stool which did not improve his symptoms.  Of note, patient has mild MR, but is able to give history the yes and no answers and shaking of his head.  Patient is a 51 y.o. male presenting with abdominal pain. The history is provided by the patient.  Abdominal Pain The primary symptoms of the illness include abdominal pain. The primary symptoms of the illness do not include fever, shortness of breath, nausea or vomiting. The current episode started 3 to 5 hours ago. The onset of the illness was gradual. The problem has not changed since onset. The abdominal pain is located in the LLQ, RLQ and perineum (patient has history of constipation with recent impaction, and indicates he feels he is impacted again today.  His last bowel movement was earlier today, but hard and scant.  It did not relieve his symptoms.). The abdominal pain does not radiate. The severity of the abdominal pain is 10/10. The abdominal pain is relieved by nothing. Exacerbated by: he did eat breakfast today, this did not worsen his pain.  He denies vomiting, denies fevers.  The patient has not had a change in bowel habit (has chronic constipation.). Additional symptoms associated with the illness include constipation. Symptoms associated with the illness do not include chills, anorexia, hematuria, frequency or back  pain. Significant associated medical issues include diabetes.    Past Medical History  Diagnosis Date  . Renal disorder   . Elective mutism   . Autism   . Hyperosmolar (nonketotic) coma   . Hyperglycemia   . Diabetes mellitus   . Hyponatremia   . Mental retardation   . Urinary retention   . BPH (benign prostatic hyperplasia)   . Gastroparesis   . Depression   . Pneumonia   . UTI (urinary tract infection)   . Mental retardation     History reviewed. No pertinent past surgical history.  No family history on file.  History  Substance Use Topics  . Smoking status: Never Smoker   . Smokeless tobacco: Not on file  . Alcohol Use: No      Review of Systems  Constitutional: Negative for fever and chills.  HENT: Negative for congestion, sore throat and neck pain.   Eyes: Negative.   Respiratory: Negative for chest tightness and shortness of breath.   Cardiovascular: Negative for chest pain.  Gastrointestinal: Positive for abdominal pain, constipation and rectal pain. Negative for nausea, vomiting and anorexia.  Genitourinary: Negative.  Negative for frequency and hematuria.  Musculoskeletal: Negative for back pain, joint swelling and arthralgias.  Skin: Negative.  Negative for rash and wound.  Neurological: Negative for dizziness, weakness, light-headedness, numbness and headaches.  Hematological: Negative.   Psychiatric/Behavioral: Negative.     Allergies  Review of patient's allergies indicates no known allergies.  Home Medications   Current Outpatient Rx  Name Route Sig Dispense Refill  .  ACETAMINOPHEN 325 MG PO TABS Oral Take 650 mg by mouth every 6 (six) hours as needed.    Marland Kitchen BISACODYL 10 MG RE SUPP Rectal Place 10 mg rectally as needed.    Marland Kitchen HYOSCYAMINE SULFATE 0.125 MG SL SUBL  Place one or two tablets under tongue every 4 hours if needed for relief of abdominal pain. 30 tablet 0  . INSULIN ASPART 100 UNIT/ML [place] SOLN Subcutaneous Inject 15 Units into the skin  3 (three) times daily before meals. Per sliding scale instructions: If 150-200= 1 unit, 201-250= 2 units, 251-300= 3 units, 301-350= 4 units, 351-400= 5 units, Greater than 400= 6 units    . INSULIN GLARGINE 100 UNIT/ML [place] SOLN Subcutaneous Inject 40 Units into the skin at bedtime.     Marland Kitchen LEVOFLOXACIN 500 MG PO TABS Oral Take 500 mg by mouth daily.    Marland Kitchen LISINOPRIL 10 MG PO TABS Oral Take 10 mg by mouth every morning.     Marland Kitchen RANITIDINE HCL 150 MG PO CAPS Oral Take 150 mg by mouth 2 (two) times daily.      . SERTRALINE HCL 50 MG PO TABS Oral Take 150 mg by mouth every morning.     . SULFAMETHOXAZOLE-TMP DS 800-160 MG PO TABS Oral Take 1 tablet by mouth every evening. 30 tablet 1  . TAMSULOSIN HCL 0.4 MG PO CAPS Oral Take 0.4 mg by mouth every morning.     . TRAZODONE HCL 50 MG PO TABS Oral Take 50 mg by mouth at bedtime.      Marland Kitchen ZIPRASIDONE HCL 40 MG PO CAPS Oral Take 40 mg by mouth 2 (two) times daily with a meal.        BP 112/66  Pulse 73  Temp(Src) 97.5 F (36.4 C) (Oral)  Resp 20  Ht 5\' 10"  (1.778 m)  Wt 160 lb (72.576 kg)  BMI 22.96 kg/m2  SpO2 100%  Physical Exam  Nursing note and vitals reviewed. Constitutional: He is oriented to person, place, and time. He appears well-developed and well-nourished.  HENT:  Head: Normocephalic and atraumatic.  Eyes: Conjunctivae are normal.  Neck: Normal range of motion.  Cardiovascular: Normal rate, regular rhythm, normal heart sounds and intact distal pulses.   Pulmonary/Chest: Effort normal and breath sounds normal. He has no wheezes.  Abdominal: Soft. Bowel sounds are normal. He exhibits no distension and no mass. There is no tenderness. There is no rebound and no guarding.       Digital rectal exam performed with no stool in rectal vault, no hemorrhoids, nontender exam.  Musculoskeletal: Normal range of motion.  Neurological: He is alert and oriented to person, place, and time.  Skin: Skin is warm and dry.  Psychiatric: He has a normal  mood and affect.    ED Course  Procedures (including critical care time)  Labs Reviewed  GLUCOSE, CAPILLARY - Abnormal; Notable for the following:    Glucose-Capillary 228 (*)    All other components within normal limits   No results found.   1. Constipation   2. Abdominal cramping       MDM  Chronic constipation.  He was prescribed MiraLax at his visit 4 days ago, and is  Encouraged to start taking this daily.  He'll also prescribed a small amount of Levsin to help with abdominal cramping.  Followup with PCP if symptoms persist.  Patient was also seen by Dr. Wilson Singer who agrees with today's plan.  The patient appears reasonably screened and/or stabilized for  discharge and I doubt any other medical condition or other [facility] requiring further screening, evaluation, or treatment in the ED at this time prior to discharge.         Fulton Reek, PA 11/13/11 1122

## 2011-11-13 NOTE — ED Notes (Signed)
Pt c/o abd pain and nausea since last night.  Pt nonverbal, grimacing, restless.  Pt points to umbilical region.  Pt has indwelling foley in place, staff says has been draining urine and there is some urine in foley bag.  Staff says foley bag was emptied prior to coming to ed.  Pt resident at [facility].

## 2011-11-16 ENCOUNTER — Encounter (HOSPITAL_COMMUNITY): Payer: Self-pay

## 2011-11-16 ENCOUNTER — Emergency Department (HOSPITAL_COMMUNITY)
Admission: EM | Admit: 2011-11-16 | Discharge: 2011-11-16 | Disposition: A | Discharge status: Home / Self Care | Payer: Medicaid Other | Attending: Emergency Medicine | Admitting: Emergency Medicine

## 2011-11-16 DIAGNOSIS — Z79899 Other long term (current) drug therapy: Secondary | ICD-10-CM | POA: Insufficient documentation

## 2011-11-16 DIAGNOSIS — E119 Type 2 diabetes mellitus without complications: Secondary | ICD-10-CM | POA: Insufficient documentation

## 2011-11-16 DIAGNOSIS — F3289 Other specified depressive episodes: Secondary | ICD-10-CM | POA: Insufficient documentation

## 2011-11-16 DIAGNOSIS — R339 Retention of urine, unspecified: Secondary | ICD-10-CM | POA: Insufficient documentation

## 2011-11-16 DIAGNOSIS — F79 Unspecified intellectual disabilities: Secondary | ICD-10-CM | POA: Insufficient documentation

## 2011-11-16 DIAGNOSIS — T83021A Displacement of indwelling urethral catheter, initial encounter: Secondary | ICD-10-CM

## 2011-11-16 DIAGNOSIS — F329 Major depressive disorder, single episode, unspecified: Secondary | ICD-10-CM | POA: Insufficient documentation

## 2011-11-16 DIAGNOSIS — Z794 Long term (current) use of insulin: Secondary | ICD-10-CM | POA: Insufficient documentation

## 2011-11-16 DIAGNOSIS — N400 Enlarged prostate without lower urinary tract symptoms: Secondary | ICD-10-CM | POA: Insufficient documentation

## 2011-11-16 DIAGNOSIS — F840 Autistic disorder: Secondary | ICD-10-CM | POA: Insufficient documentation

## 2011-11-16 NOTE — ED Provider Notes (Signed)
History     CSN: [id number]  Arrival date & time 11/16/11  87   First MD Initiated Contact with Patient 11/16/11 1440      Chief Complaint  Patient presents with  . Urinary Retention    The history is provided by the nursing home. History Limited By: MR and non-verbal.  Pt was seen at 1455.   Per EMS and NH report, c/o gradual onset and persistence of constant "foley not working" that began today PTA.  No reported N/V, no reported fevers.     Past Medical History  Diagnosis Date  . Renal disorder   . Elective mutism   . Autism   . Hyperosmolar (nonketotic) coma   . Hyperglycemia   . Diabetes mellitus   . Hyponatremia   . Mental retardation   . Urinary retention   . BPH (benign prostatic hyperplasia)   . Gastroparesis   . Depression   . Pneumonia   . UTI (urinary tract infection)   . Mental retardation     History reviewed. No pertinent past surgical history.   History  Substance Use Topics  . Smoking status: Never Smoker   . Smokeless tobacco: Not on file  . Alcohol Use: No      Review of Systems  Unable to perform ROS: Other    Allergies  Review of patient's allergies indicates no known allergies.  Home Medications   Current Outpatient Rx  Name Route Sig Dispense Refill  . ACETAMINOPHEN 325 MG PO TABS Oral Take 650 mg by mouth every 6 (six) hours as needed.    . INSULIN ASPART 100 UNIT/ML [place] SOLN Subcutaneous Inject 15 Units into the skin 3 (three) times daily before meals. Per sliding scale instructions: If 150-200= 1 unit, 201-250= 2 units, 251-300= 3 units, 301-350= 4 units, 351-400= 5 units, Greater than 400= 6 units// in addition to Sliding Scale    . INSULIN GLARGINE 100 UNIT/ML [place] SOLN Subcutaneous Inject 40 Units into the skin at bedtime.     Marland Kitchen LISINOPRIL 10 MG PO TABS Oral Take 10 mg by mouth every morning.     Marland Kitchen RANITIDINE HCL 150 MG PO CAPS Oral Take 150 mg by mouth 2 (two) times daily.      . SERTRALINE HCL 50 MG PO TABS Oral Take 150 mg  by mouth every morning.     . SULFAMETHOXAZOLE-TMP DS 800-160 MG PO TABS Oral Take 1 tablet by mouth every evening. 30 tablet 1  . TAMSULOSIN HCL 0.4 MG PO CAPS Oral Take 0.4 mg by mouth every morning.     . TRAZODONE HCL 50 MG PO TABS Oral Take 50 mg by mouth at bedtime.      Marland Kitchen ZIPRASIDONE HCL 40 MG PO CAPS Oral Take 40 mg by mouth 2 (two) times daily with a meal.      . BISACODYL 10 MG RE SUPP Rectal Place 10 mg rectally as needed.    Marland Kitchen HYOSCYAMINE SULFATE 0.125 MG SL SUBL  Place one or two tablets under tongue every 4 hours if needed for relief of abdominal pain. 30 tablet 0    BP 151/100  Pulse 80  Temp(Src) 97.6 F (36.4 C) (Oral)  Resp 22  Ht 5\' 10"  (1.778 m)  Wt 165 lb (74.844 kg)  BMI 23.68 kg/m2  SpO2 98%  Physical Exam 1500: Physical examination:  Nursing notes reviewed; Vital signs and O2 SAT reviewed;  Constitutional: Well developed, Well nourished, Well hydrated, In no acute distress; Head:  Normocephalic, atraumatic; Eyes: EOMI, PERRL, No scleral icterus; ENMT: Mouth and pharynx normal, Mucous membranes moist; Neck: Supple, Full range of motion, No lymphadenopathy; Cardiovascular: Regular rate and rhythm, No murmur, rub, or gallop; Respiratory: Breath sounds clear & equal bilaterally, No rales, rhonchi, wheezes, or rub, Normal respiratory effort/excursion; Chest: Nontender, Movement normal; Abdomen: Soft, +mild suprapubic distention and tenderness to palp, minimal urine in foley bag, Normal bowel sounds; Genitourinary: No CVA tenderness; Extremities: Pulses normal, No tenderness, No edema, No calf edema or asymmetry.; Neuro: Awake, alert, MR and non-verbal per hx, nods head yes and no to questions.  Moves all ext on stretcher.; Skin: Color normal, Warm, Dry   ED Course  Procedures   MDM  MDM Reviewed: nursing note, vitals and previous chart      4:45 PM:  ED RN noted that balloon of Foley cath was not inflated as foley cath "fell out" when she flushed it.  ED RN  replaced foley cath with immediate return of approx 293ml clear/yellow urine.   Pt nods his head and pats his lower abd smiling, when asked if he feels better nods head "yes" and wants to go home.  Has tol PO well while in ED.  Will d/c back to facility.        Alfonzo Feller, DO 11/18/11 1301

## 2011-11-16 NOTE — ED Notes (Signed)
Pt stated his leg back is backing up, resides at [facility]

## 2011-11-16 NOTE — Discharge Instructions (Signed)
RESOURCE GUIDE  Dental Problems  Patients with Medicaid: [place] [employer] Phone:  [phone]                                                  Phone:  [phone]  If unable to pay or uninsured, contact:  [facility] or [facility]. to become qualified for the adult dental clinic.  Chronic Pain Problems Contact [facility]  [phone] Patients need to be referred by their primary care doctor.  Insufficient Money for Medicine Contact [employer]:  call "211" or [place] [phone].  No Primary Care Doctor Call [facility]  [phone] Other agencies that provide inexpensive medical care    [place]  [phone]    [facility]  [place]  [phone]    [facility]  [phone]    [facility]  [place]  [place]  [phone] [place]   [phone] (emergency services [phone])  [facility]  [phone] [facility] [phone] [facility] [place] [phone] [place] [phone] Residential & Outpatient Substance Abuse Program  [phone]  Abuse/Neglect [place] [phone] [place] [phone] (After Hours)  Emergency [place] [phone]  [place] [facility] [place] [phone] [place] [phone]  MRSA Hotline #:   727-235-4053    [facility] Dept. [address]      [place] Phone:  [phone]                                   Phone:  [phone]                 Phone:  [place] Phone:  [phone]  [place] [phone] (After Hours)   Take your usual prescriptions as previously directed.  Call your regular medical doctor and your Urologist tomorrow morning to schedule a follow up appointment within the next week.  Return to the Emergency Department immediately sooner if worsening.

## 2011-11-16 NOTE — ED Notes (Addendum)
Pt comfortable at this time. 500 mls urine collected in foley bag. No c/o voiced at this time. Sprite given per request.

## 2011-11-16 NOTE — ED Notes (Signed)
Pt unable to speak in sentences, uses short phrase or points to area of question/pain. Pt is noted to be very restless, agitated and pointing to lower abd/pelvic area. Foley catheter intact, however is not patent.

## 2011-11-16 NOTE — ED Notes (Signed)
Attempted to irrigate foley catheter without success. Catheter fell out of pt, no dilatation of balloon noted. Foley catheter replaced with 14 fr. Per policy, pt tolerated well. Lt yellow urine draining from newly placed foley.

## 2011-12-04 ENCOUNTER — Encounter (HOSPITAL_COMMUNITY): Payer: Self-pay | Admitting: Emergency Medicine

## 2011-12-04 ENCOUNTER — Emergency Department (HOSPITAL_COMMUNITY)
Admission: EM | Admit: 2011-12-04 | Discharge: 2011-12-04 | Disposition: A | Discharge status: Skilled Nursing Facility | Payer: Medicaid Other | Attending: Emergency Medicine | Admitting: Emergency Medicine

## 2011-12-04 DIAGNOSIS — Y846 Urinary catheterization as the cause of abnormal reaction of the patient, or of later complication, without mention of misadventure at the time of the procedure: Secondary | ICD-10-CM | POA: Insufficient documentation

## 2011-12-04 DIAGNOSIS — R339 Retention of urine, unspecified: Secondary | ICD-10-CM

## 2011-12-04 DIAGNOSIS — F840 Autistic disorder: Secondary | ICD-10-CM | POA: Insufficient documentation

## 2011-12-04 DIAGNOSIS — N138 Other obstructive and reflux uropathy: Secondary | ICD-10-CM | POA: Insufficient documentation

## 2011-12-04 DIAGNOSIS — N401 Enlarged prostate with lower urinary tract symptoms: Secondary | ICD-10-CM | POA: Insufficient documentation

## 2011-12-04 DIAGNOSIS — Z794 Long term (current) use of insulin: Secondary | ICD-10-CM | POA: Insufficient documentation

## 2011-12-04 DIAGNOSIS — R338 Other retention of urine: Secondary | ICD-10-CM | POA: Insufficient documentation

## 2011-12-04 DIAGNOSIS — Z79899 Other long term (current) drug therapy: Secondary | ICD-10-CM | POA: Insufficient documentation

## 2011-12-04 DIAGNOSIS — T83091A Other mechanical complication of indwelling urethral catheter, initial encounter: Secondary | ICD-10-CM | POA: Insufficient documentation

## 2011-12-04 DIAGNOSIS — F79 Unspecified intellectual disabilities: Secondary | ICD-10-CM | POA: Insufficient documentation

## 2011-12-04 DIAGNOSIS — E119 Type 2 diabetes mellitus without complications: Secondary | ICD-10-CM | POA: Insufficient documentation

## 2011-12-04 DIAGNOSIS — N289 Disorder of kidney and ureter, unspecified: Secondary | ICD-10-CM | POA: Insufficient documentation

## 2011-12-04 DIAGNOSIS — Z87440 Personal history of urinary (tract) infections: Secondary | ICD-10-CM | POA: Insufficient documentation

## 2011-12-04 LAB — POCT I-STAT, CHEM 8
BUN: 39 mg/dL — ABNORMAL HIGH (ref 6–23)
Calcium, Ion: 1.25 mmol/L (ref 1.12–1.32)
Chloride: 105 mEq/L (ref 96–112)
Glucose, Bld: 540 mg/dL — ABNORMAL HIGH (ref 70–99)
HCT: 30 % — ABNORMAL LOW (ref 39.0–52.0)
TCO2: 21 mmol/L (ref 0–100)

## 2011-12-04 LAB — URINALYSIS, ROUTINE W REFLEX MICROSCOPIC
Ketones, ur: NEGATIVE mg/dL
Leukocytes, UA: NEGATIVE
Protein, ur: NEGATIVE mg/dL
Urobilinogen, UA: 0.2 mg/dL (ref 0.0–1.0)

## 2011-12-04 LAB — URINE MICROSCOPIC-ADD ON

## 2011-12-04 NOTE — ED Provider Notes (Signed)
History   This chart was scribed for Oscar Pollack, MD by Carolyne Littles. The patient was seen in room APA07/APA07. Patient's care was started at 1108.    CSN: [id number]  Arrival date & time 12/04/11  1108   First MD Initiated Contact with Patient 12/04/11 1116      Chief Complaint  Patient presents with  . Urinary Retention    (Consider location/radiation/quality/duration/timing/severity/associated sxs/prior treatment) HPI Level 5 Caveat Applies due to patient condition (non-verbal, MR) DAVONTAY SOKALSKI is a 51 y.o. male who presents to the Emergency Department from [facility] complaining of urinary retention as his foley catheter is not draining properly. Reports he is non ambulatory. Patient with h/o renal disorder, autism, elective mutism, diabetes, mental retardation, BPH, gastroparesis, UTI  Past Medical History  Diagnosis Date  . Renal disorder   . Elective mutism   . Autism   . Hyperosmolar (nonketotic) coma   . Hyperglycemia   . Diabetes mellitus   . Hyponatremia   . Mental retardation   . Urinary retention   . BPH (benign prostatic hyperplasia)   . Gastroparesis   . Depression   . Pneumonia   . UTI (urinary tract infection)   . Mental retardation     History reviewed. No pertinent past surgical history.  No family history on file.  History  Substance Use Topics  . Smoking status: Never Smoker   . Smokeless tobacco: Not on file  . Alcohol Use: No      Review of Systems A complete 10 system review of systems was obtained and all systems are negative except as noted in the HPI and PMH.   Allergies  Review of patient's allergies indicates no known allergies.  Home Medications   Current Outpatient Rx  Name Route Sig Dispense Refill  . INSULIN ASPART 100 UNIT/ML [place] SOLN Subcutaneous Inject 15 Units into the skin 3 (three) times daily before meals. Per sliding scale instructions: If 150-200= 1 unit, 201-250= 2 units, 251-300= 3 units, 301-350= 4  units, 351-400= 5 units, Greater than 400= 6 units// in addition to Sliding Scale    . INSULIN GLARGINE 100 UNIT/ML [place] SOLN Subcutaneous Inject 40 Units into the skin at bedtime.     Marland Kitchen LISINOPRIL 10 MG PO TABS Oral Take 10 mg by mouth every morning.     Marland Kitchen POLYETHYLENE GLYCOL 3350 PO PACK Oral Take 17 g by mouth daily.    Marland Kitchen RANITIDINE HCL 150 MG PO CAPS Oral Take 150 mg by mouth 2 (two) times daily.      . SERTRALINE HCL 50 MG PO TABS Oral Take 150 mg by mouth every morning.     . SULFAMETHOXAZOLE-TMP DS 800-160 MG PO TABS Oral Take 1 tablet by mouth every evening. 30 tablet 1  . TAMSULOSIN HCL 0.4 MG PO CAPS Oral Take 0.4 mg by mouth every morning.     . TRAZODONE HCL 50 MG PO TABS Oral Take 50 mg by mouth at bedtime.      Marland Kitchen ZIPRASIDONE HCL 40 MG PO CAPS Oral Take 40 mg by mouth 2 (two) times daily with a meal.      . ACETAMINOPHEN 325 MG PO TABS Oral Take 650 mg by mouth every 6 (six) hours as needed.    Marland Kitchen BISACODYL 10 MG RE SUPP Rectal Place 10 mg rectally as needed.    Marland Kitchen HYOSCYAMINE SULFATE 0.125 MG SL SUBL  Place one or two tablets under tongue every 4 hours if needed  for relief of abdominal pain. 30 tablet 0    BP 177/69  Pulse 83  Temp 97.9 F (36.6 C)  Resp 18  SpO2 100%  Physical Exam  Nursing note and vitals reviewed. Constitutional: He appears well-developed and well-nourished. No distress.  HENT:  Head: Normocephalic and atraumatic.  Mouth/Throat: Oropharynx is clear and moist.  Eyes: EOM are normal. Pupils are equal, round, and reactive to light.  Neck: Neck supple. No tracheal deviation present.  Cardiovascular: Normal rate and regular rhythm.  Exam reveals no gallop and no friction rub.   No murmur heard. Pulmonary/Chest: Effort normal. No respiratory distress. He has no wheezes. He has no rales.  Abdominal: Soft. He exhibits no distension.  Genitourinary:       Foley catheter present.  Musculoskeletal: Normal range of motion. He exhibits no edema.  Neurological: He  is alert.       Follows commands. Non verbal.   Skin: Skin is warm and dry.    ED Course  Procedures (including critical care time)  DIAGNOSTIC STUDIES: Oxygen Saturation is 100% on room air, normal by my interpretation.    COORDINATION OF CARE: 11:20 AM- Patient informed of current plan for treatment and evaluation and agrees with plan at this time.  11:23AM- Patient's past medical records reviewed at bedside.  12:51PM- Patient states he is feeling better at this time after having 700 ml urine drained with placement of Foley Catheter.   Labs Reviewed  URINALYSIS, ROUTINE W REFLEX MICROSCOPIC - Abnormal; Notable for the following:    Glucose, UA >1000 (*)    Hgb urine dipstick TRACE (*)    All other components within normal limits  URINE MICROSCOPIC-ADD ON   No results found.   1. Urinary retention       MDM  I personally performed the services described in this documentation, which was scribed in my presence. The recorded information has been reviewed and considered.   Oscar Pollack, MD 12/04/11 [phone]

## 2011-12-04 NOTE — ED Notes (Signed)
Pt c/o pain/pressure  from indwelling foley catheter.

## 2011-12-04 NOTE — ED Notes (Signed)
Pt stated foley had not been draining. New foley inserted by NT on arrival. Pt states relief of pain.

## 2011-12-04 NOTE — Discharge Instructions (Signed)
Acute Urinary Retention, Male You have been seen by a caregiver today because of your inability to urinate (pass your water). This is a common problem in elderly males. As men age their prostates become larger and block the flow of urine from the bladder. This is usually a problem that has come on gradually. It is often first noticed by having to get up at night to urinate. This is because as the prostate enlarges it is more difficult to empty the bladder completely. Treatment may involve a one time catheterization to empty the bladder. This is putting in a tube to drain your urine. Then you and your personal caregiver can decide at your earliest convenience how to handle this problem in the future. It may also be a problem that may not recur for years. Sometimes this problem can be caused by medications. In this case, all that is often necessary is to discontinue the offending agent. If you are to leave the foley catheter (a long, narrow, hollow tube) in and go home with a drainage system, you will need to discuss the best course of action with your caregiver. While the catheter is in, maintain a good intake of fluids. Keep the drainage bag emptied and lower than your catheter. This is so contaminated (infected) urine will not be flowing back into your bladder. This could lead to a urinary tract infection. Only take over-the-counter or prescription medicines for pain, discomfort, or fever as directed by your caregiver.  SEEK IMMEDIATE MEDICAL CARE IF:  You develop chills, fever, or show signs of generalized illness that occurs prior to seeing your caregiver. Document Released: 11/26/2000 Document Revised: 08/09/2011 Document Reviewed: 08/11/2008 [facility] Patient Information 2012 [place].

## 2011-12-14 ENCOUNTER — Encounter (HOSPITAL_COMMUNITY): Payer: Self-pay | Admitting: *Deleted

## 2011-12-14 ENCOUNTER — Emergency Department (HOSPITAL_COMMUNITY)
Admission: EM | Admit: 2011-12-14 | Discharge: 2011-12-14 | Disposition: A | Discharge status: Home / Self Care | Payer: Medicaid Other | Attending: Emergency Medicine | Admitting: Emergency Medicine

## 2011-12-14 DIAGNOSIS — R4701 Aphasia: Secondary | ICD-10-CM | POA: Insufficient documentation

## 2011-12-14 DIAGNOSIS — K3184 Gastroparesis: Secondary | ICD-10-CM | POA: Insufficient documentation

## 2011-12-14 DIAGNOSIS — T839XXA Unspecified complication of genitourinary prosthetic device, implant and graft, initial encounter: Secondary | ICD-10-CM

## 2011-12-14 DIAGNOSIS — Z794 Long term (current) use of insulin: Secondary | ICD-10-CM | POA: Insufficient documentation

## 2011-12-14 DIAGNOSIS — N400 Enlarged prostate without lower urinary tract symptoms: Secondary | ICD-10-CM | POA: Insufficient documentation

## 2011-12-14 DIAGNOSIS — F79 Unspecified intellectual disabilities: Secondary | ICD-10-CM | POA: Insufficient documentation

## 2011-12-14 DIAGNOSIS — F840 Autistic disorder: Secondary | ICD-10-CM | POA: Insufficient documentation

## 2011-12-14 DIAGNOSIS — R109 Unspecified abdominal pain: Secondary | ICD-10-CM | POA: Insufficient documentation

## 2011-12-14 DIAGNOSIS — E1149 Type 2 diabetes mellitus with other diabetic neurological complication: Secondary | ICD-10-CM | POA: Insufficient documentation

## 2011-12-14 NOTE — ED Notes (Signed)
Patient left with foley intact

## 2011-12-14 NOTE — ED Provider Notes (Signed)
History     CSN: [id number]  Arrival date & time 12/14/11  0203   First MD Initiated Contact with Patient 12/14/11 [phone]      Chief Complaint  Patient presents with  . Groin Pain    (Consider location/radiation/quality/duration/timing/severity/associated sxs/prior treatment) HPI Level 5 Caveat Applies due to patient condition (non-verbal, MR)  ROWE SILKWORTH is a 51 y.o. male, resident of a nursing facility,  with h/o renal disorder, autism, elective mutism, diabetes, mental retardation, BPH, gastroparesis, UTI brought in by ambulance, who presents to the Emergency Department complaining of  urinary retention and leaking from the indwelling foley catheter. Patient is non ambulatory.  PCP Dr. Legrand Rams  Past Medical History  Diagnosis Date  . Renal disorder   . Elective mutism   . Autism   . Hyperosmolar (nonketotic) coma   . Hyperglycemia   . Diabetes mellitus   . Hyponatremia   . Mental retardation   . Urinary retention   . BPH (benign prostatic hyperplasia)   . Gastroparesis   . Depression   . Pneumonia   . UTI (urinary tract infection)   . Mental retardation     History reviewed. No pertinent past surgical history.  History reviewed. No pertinent family history.  History  Substance Use Topics  . Smoking status: Never Smoker   . Smokeless tobacco: Not on file  . Alcohol Use: No      Review of Systems  Unable to perform ROS: Other    Allergies  Review of patient's allergies indicates no known allergies.  Home Medications   Current Outpatient Rx  Name Route Sig Dispense Refill  . ACETAMINOPHEN 325 MG PO TABS Oral Take 650 mg by mouth every 6 (six) hours as needed.    Marland Kitchen BISACODYL 10 MG RE SUPP Rectal Place 10 mg rectally as needed.    Marland Kitchen HYOSCYAMINE SULFATE 0.125 MG SL SUBL  Place one or two tablets under tongue every 4 hours if needed for relief of abdominal pain. 30 tablet 0  . INSULIN ASPART 100 UNIT/ML [place] SOLN Subcutaneous Inject 15 Units into the  skin 3 (three) times daily before meals. Per sliding scale instructions: If 150-200= 1 unit, 201-250= 2 units, 251-300= 3 units, 301-350= 4 units, 351-400= 5 units, Greater than 400= 6 units// in addition to Sliding Scale    . INSULIN GLARGINE 100 UNIT/ML [place] SOLN Subcutaneous Inject 40 Units into the skin at bedtime.     Marland Kitchen LISINOPRIL 10 MG PO TABS Oral Take 10 mg by mouth every morning.     Marland Kitchen POLYETHYLENE GLYCOL 3350 PO PACK Oral Take 17 g by mouth daily.    Marland Kitchen RANITIDINE HCL 150 MG PO CAPS Oral Take 150 mg by mouth 2 (two) times daily.      . SERTRALINE HCL 50 MG PO TABS Oral Take 150 mg by mouth every morning.     . SULFAMETHOXAZOLE-TMP DS 800-160 MG PO TABS Oral Take 1 tablet by mouth every evening. 30 tablet 1  . TAMSULOSIN HCL 0.4 MG PO CAPS Oral Take 0.4 mg by mouth every morning.     . TRAZODONE HCL 50 MG PO TABS Oral Take 50 mg by mouth at bedtime.      Marland Kitchen ZIPRASIDONE HCL 40 MG PO CAPS Oral Take 40 mg by mouth 2 (two) times daily with a meal.        BP 112/70  Pulse 71  Temp(Src) 97.8 F (36.6 C) (Oral)  Resp 22  SpO2 100%  Physical Exam Nursing note and vitals reviewed.  Constitutional: He appears well-developed and well-nourished. No distress.  HENT:  Head: Normocephalic and atraumatic.  Mouth/Throat: Oropharynx is clear and moist.  Eyes: EOM are normal. Pupils are equal, round, and reactive to light.  Neck: Neck supple. No tracheal deviation present.  Cardiovascular: Normal rate and regular rhythm. Exam reveals no gallop and no friction rub.  No murmur heard.  Pulmonary/Chest: Effort normal. No respiratory distress. He has no wheezes. He has no rales.  Abdominal: Soft. He exhibits no distension.  Genitourinary: Circumcised Foley catheter present.  Musculoskeletal: Normal range of motion. He exhibits no edema.  Neurological: He is alert.  Follows commands. Non verbal.  Skin: Skin is warm and dry.   ED Course  Procedures (including critical care time)     MDM    Nursing home resident here with leaking from indwelling Foley catheter. Catheter was replaced. No leaking noted.Pt stable in ED with no significant deterioration in condition.The patient appears reasonably screened and/or stabilized for discharge and I doubt any other medical condition or other [facility] requiring further screening, evaluation, or treatment in the ED at this time prior to discharge.  MDM Reviewed: nursing note, previous chart and vitals           Gypsy Balsam. Olin Hauser, MD 12/14/11 [phone]

## 2011-12-14 NOTE — Discharge Instructions (Signed)
Continue current medicines. Your Foley catheter has been replaced. Follow up with Dr. Legrand Rams.

## 2011-12-14 NOTE — ED Notes (Signed)
Pain in groin area from foley cathether . Patient from [facility]. Sherry Ruffing  [place] states its leaking

## 2011-12-19 ENCOUNTER — Emergency Department (HOSPITAL_COMMUNITY)
Admission: EM | Admit: 2011-12-19 | Discharge: 2011-12-19 | Disposition: A | Discharge status: Skilled Nursing Facility | Payer: Medicaid Other | Attending: Emergency Medicine | Admitting: Emergency Medicine

## 2011-12-19 ENCOUNTER — Encounter (HOSPITAL_COMMUNITY): Payer: Self-pay | Admitting: *Deleted

## 2011-12-19 DIAGNOSIS — Z794 Long term (current) use of insulin: Secondary | ICD-10-CM | POA: Insufficient documentation

## 2011-12-19 DIAGNOSIS — Y846 Urinary catheterization as the cause of abnormal reaction of the patient, or of later complication, without mention of misadventure at the time of the procedure: Secondary | ICD-10-CM | POA: Insufficient documentation

## 2011-12-19 DIAGNOSIS — F840 Autistic disorder: Secondary | ICD-10-CM | POA: Insufficient documentation

## 2011-12-19 DIAGNOSIS — T839XXA Unspecified complication of genitourinary prosthetic device, implant and graft, initial encounter: Secondary | ICD-10-CM

## 2011-12-19 DIAGNOSIS — F329 Major depressive disorder, single episode, unspecified: Secondary | ICD-10-CM | POA: Insufficient documentation

## 2011-12-19 DIAGNOSIS — F79 Unspecified intellectual disabilities: Secondary | ICD-10-CM | POA: Insufficient documentation

## 2011-12-19 DIAGNOSIS — Z79899 Other long term (current) drug therapy: Secondary | ICD-10-CM | POA: Insufficient documentation

## 2011-12-19 DIAGNOSIS — F3289 Other specified depressive episodes: Secondary | ICD-10-CM | POA: Insufficient documentation

## 2011-12-19 DIAGNOSIS — E119 Type 2 diabetes mellitus without complications: Secondary | ICD-10-CM | POA: Insufficient documentation

## 2011-12-19 DIAGNOSIS — T8389XA Other specified complication of genitourinary prosthetic devices, implants and grafts, initial encounter: Secondary | ICD-10-CM | POA: Insufficient documentation

## 2011-12-19 LAB — URINALYSIS, ROUTINE W REFLEX MICROSCOPIC
Glucose, UA: 500 mg/dL — AB
Ketones, ur: NEGATIVE mg/dL
Nitrite: NEGATIVE
Specific Gravity, Urine: 1.01 (ref 1.005–1.030)
pH: 6.5 (ref 5.0–8.0)

## 2011-12-19 LAB — GLUCOSE, CAPILLARY
Glucose-Capillary: 67 mg/dL — ABNORMAL LOW (ref 70–99)
Glucose-Capillary: 105 mg/dL — ABNORMAL HIGH (ref 70–99)

## 2011-12-19 NOTE — ED Notes (Signed)
Discharge instructions reviewed with pt, questions answered. Pt verbalized understanding.  

## 2011-12-19 NOTE — ED Notes (Signed)
C/o increased pain at foley cath site.  Denies blood in urine.

## 2011-12-19 NOTE — ED Provider Notes (Signed)
History     CSN: [id number]  Arrival date & time 12/19/11  1422   First MD Initiated Contact with Patient 12/19/11 1656      Chief Complaint  Patient presents with  . catheter pain     (Consider location/radiation/quality/duration/timing/severity/associated sxs/prior treatment) HPI [name] is a 51 y.o. male, resident of a nursing facility, with h/o renal disorder, autism, elective mutism, diabetes, mental retardation, BPH, gastroparesis, UTI brought in by ambulance, who presents to the Emergency Department complaining of pain around meatus from foley catheter and difficulty voiding.    PCP Dr. Legrand Rams   Past Medical History  Diagnosis Date  . Renal disorder   . Elective mutism   . Autism   . Hyperosmolar (nonketotic) coma   . Hyperglycemia   . Diabetes mellitus   . Hyponatremia   . Mental retardation   . Urinary retention   . BPH (benign prostatic hyperplasia)   . Gastroparesis   . Depression   . Pneumonia   . UTI (urinary tract infection)   . Mental retardation     History reviewed. No pertinent past surgical history.  No family history on file.  History  Substance Use Topics  . Smoking status: Never Smoker   . Smokeless tobacco: Not on file  . Alcohol Use: No      Review of Systems  Unable to perform ROS: Other    Allergies  Review of patient's allergies indicates no known allergies.  Home Medications   Current Outpatient Rx  Name Route Sig Dispense Refill  . ACETAMINOPHEN 325 MG PO TABS Oral Take 650 mg by mouth every 6 (six) hours as needed.    Marland Kitchen BISACODYL 10 MG RE SUPP Rectal Place 10 mg rectally as needed.    Marland Kitchen HYOSCYAMINE SULFATE 0.125 MG SL SUBL  Place one or two tablets under tongue every 4 hours if needed for relief of abdominal pain. 30 tablet 0  . INSULIN ASPART 100 UNIT/ML [place] SOLN Subcutaneous Inject 15 Units into the skin 3 (three) times daily before meals. Per sliding scale instructions: If 150-200= 1 unit, 201-250= 2 units,  251-300= 3 units, 301-350= 4 units, 351-400= 5 units, Greater than 400= 6 units// in addition to Sliding Scale    . INSULIN GLARGINE 100 UNIT/ML [place] SOLN Subcutaneous Inject 40 Units into the skin at bedtime.     Marland Kitchen LISINOPRIL 10 MG PO TABS Oral Take 10 mg by mouth every morning.     Marland Kitchen POLYETHYLENE GLYCOL 3350 PO PACK Oral Take 17 g by mouth daily.    Marland Kitchen RANITIDINE HCL 150 MG PO CAPS Oral Take 150 mg by mouth 2 (two) times daily.      . SERTRALINE HCL 50 MG PO TABS Oral Take 150 mg by mouth every morning.     . SULFAMETHOXAZOLE-TMP DS 800-160 MG PO TABS Oral Take 1 tablet by mouth every evening. 30 tablet 1  . TAMSULOSIN HCL 0.4 MG PO CAPS Oral Take 0.4 mg by mouth every morning.     . TRAZODONE HCL 50 MG PO TABS Oral Take 50 mg by mouth at bedtime.      Marland Kitchen ZIPRASIDONE HCL 40 MG PO CAPS Oral Take 40 mg by mouth 2 (two) times daily with a meal.        BP 107/78  Pulse 83  Temp(Src) 97.8 F (36.6 C) (Oral)  Resp 18  Ht 5\' 10"  (1.778 m)  Wt 165 lb (74.844 kg)  BMI 23.68 kg/m2  SpO2  100%  Physical Exam  Nursing note and vitals reviewed. Constitutional:       Awake, alert, nontoxic appearance.Non verbal  HENT:  Head: Atraumatic.  Eyes: Right eye exhibits no discharge. Left eye exhibits no discharge.  Neck: Neck supple.  Pulmonary/Chest: Effort normal. He exhibits no tenderness.  Abdominal: Soft. There is no tenderness. There is no rebound.  Genitourinary:       Indwelling foley draining straw colored urine. No leakage or signs of infection around meatus.  Musculoskeletal: He exhibits no tenderness.       Baseline ROM, no obvious new focal weakness.  Neurological:       Mental status and motor strength appears baseline for patient and situation.  Skin: No rash noted.  Psychiatric: He has a normal mood and affect.    ED Course  Procedures (including critical care time) Results for orders placed during the hospital encounter of 12/19/11  GLUCOSE, CAPILLARY      Component Value Range    Glucose-Capillary 67 (*) 70 - 99 (mg/dL)   Comment 1 Documented in Chart    GLUCOSE, CAPILLARY      Component Value Range   Glucose-Capillary 88  70 - 99 (mg/dL)  GLUCOSE, CAPILLARY      Component Value Range   Glucose-Capillary 105 (*) 70 - 99 (mg/dL)  URINALYSIS, ROUTINE W REFLEX MICROSCOPIC      Component Value Range   Color, Urine YELLOW  YELLOW    APPearance CLEAR  CLEAR    Specific Gravity, Urine 1.010  1.005 - 1.030    pH 6.5  5.0 - 8.0    Glucose, UA 500 (*) NEGATIVE (mg/dL)   Hgb urine dipstick NEGATIVE  NEGATIVE    Bilirubin Urine NEGATIVE  NEGATIVE    Ketones, ur NEGATIVE  NEGATIVE (mg/dL)   Protein, ur NEGATIVE  NEGATIVE (mg/dL)   Urobilinogen, UA 0.2  0.0 - 1.0 (mg/dL)   Nitrite NEGATIVE  NEGATIVE    Leukocytes, UA TRACE (*) NEGATIVE   URINE MICROSCOPIC-ADD ON      Component Value Range   Squamous Epithelial / LPF RARE  RARE    WBC, UA 7-10  <3 (WBC/hpf)   RBC / HPF 0-2  <3 (RBC/hpf)   Bacteria, UA FEW (*) RARE     MDM  Patient with an indwelling Foley catheter he pain from the Foley catheter. Catheter was flushed without difficulty. Labs unremarkable.Pt stable in ED with no significant deterioration in condition.The patient appears reasonably screened and/or stabilized for discharge and I doubt any other medical condition or other [facility] requiring further screening, evaluation, or treatment in the ED at this time prior to discharge.  MDM Reviewed: nursing note and vitals Interpretation: labs           Gypsy Balsam. Olin Hauser, MD 12/19/11 [id number]

## 2011-12-19 NOTE — Discharge Instructions (Signed)
No drainage problems found with Foley catheter. Continue regular medicines. Follow up with your doctor.

## 2011-12-19 NOTE — ED Notes (Signed)
CBG upon arrival was 67.  Gave pt OJ with 2 packets of sugar to drink.

## 2012-01-03 ENCOUNTER — Encounter (HOSPITAL_COMMUNITY): Payer: Self-pay | Admitting: Emergency Medicine

## 2012-01-03 ENCOUNTER — Emergency Department (HOSPITAL_COMMUNITY)
Admission: EM | Admit: 2012-01-03 | Discharge: 2012-01-03 | Disposition: A | Discharge status: Nursing Home (Includes ICF) | Payer: Medicaid Other | Attending: Emergency Medicine | Admitting: Emergency Medicine

## 2012-01-03 DIAGNOSIS — E119 Type 2 diabetes mellitus without complications: Secondary | ICD-10-CM | POA: Insufficient documentation

## 2012-01-03 DIAGNOSIS — T839XXA Unspecified complication of genitourinary prosthetic device, implant and graft, initial encounter: Secondary | ICD-10-CM

## 2012-01-03 DIAGNOSIS — Z794 Long term (current) use of insulin: Secondary | ICD-10-CM | POA: Insufficient documentation

## 2012-01-03 DIAGNOSIS — F79 Unspecified intellectual disabilities: Secondary | ICD-10-CM | POA: Insufficient documentation

## 2012-01-03 DIAGNOSIS — N489 Disorder of penis, unspecified: Secondary | ICD-10-CM | POA: Insufficient documentation

## 2012-01-03 DIAGNOSIS — Z87440 Personal history of urinary (tract) infections: Secondary | ICD-10-CM | POA: Insufficient documentation

## 2012-01-03 DIAGNOSIS — F940 Selective mutism: Secondary | ICD-10-CM | POA: Insufficient documentation

## 2012-01-03 MED ORDER — LIDOCAINE HCL 2 % EX GEL
Freq: Once | CUTANEOUS | Status: AC
  Administered 2012-01-03: 1 via URETHRAL
  Filled 2012-01-03: qty 10

## 2012-01-03 NOTE — ED Notes (Signed)
Pt c/o foley catheter not draining today. C/o pain to penis.

## 2012-01-03 NOTE — ED Provider Notes (Signed)
Medical screening examination/treatment/procedure(s) were performed by non-physician practitioner and as supervising physician I was immediately available for consultation/collaboration. Rolland Porter, MD, Abram Sander   Janice Norrie, MD 01/03/12 1501

## 2012-01-03 NOTE — ED Notes (Signed)
Report given to [facility] Pt comfortable No distress. Foley draining clear yellow urine. Caregiver to pick pt up Discharge instructions reviewed with caregiver at [facility]

## 2012-01-03 NOTE — ED Provider Notes (Signed)
History     CSN: [id number]  Arrival date & time 01/03/12  0900   First MD Initiated Contact with Patient 01/03/12 [phone]      Chief Complaint  Patient presents with  . Urinary Retention    (Consider location/radiation/quality/duration/timing/severity/associated sxs/prior treatment) HPI Comments: Patient presents for evaluation of Foley catheter problem.  He reports pain to his mid penis area which started this morning shortly after waking.  He denies any injury.  His Foley was changed one week ago, size 14 which is his normal size.  His nursing home records which were brought with him indicates a need to change him to a 33 [place] if he develops blockage in this new catheter, which does not seem to be occurring today as it is draining freely.  The catheter tube has been draining without difficulty.  He denies any fevers or chills.  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  The history is provided by the patient and the nursing home.    Past Medical History  Diagnosis Date  . Renal disorder   . Elective mutism   . Autism   . Hyperosmolar (nonketotic) coma   . Hyperglycemia   . Diabetes mellitus   . Hyponatremia   . Mental retardation   . Urinary retention   . BPH (benign prostatic hyperplasia)   . Gastroparesis   . Depression   . Pneumonia   . UTI (urinary tract infection)   . Mental retardation     History reviewed. No pertinent past surgical history.  No family history on file.  History  Substance Use Topics  . Smoking status: Never Smoker   . Smokeless tobacco: Not on file  . Alcohol Use: No      Review of Systems  Constitutional: Negative for fever.  HENT: Negative for congestion, sore throat and neck pain.   Eyes: Negative.   Respiratory: Negative for chest tightness and shortness of breath.   Cardiovascular: Negative for chest pain.  Gastrointestinal: Negative for nausea and  abdominal pain.  Genitourinary: Positive for penile pain. Negative for discharge, penile swelling and scrotal swelling.  Musculoskeletal: Negative for joint swelling and arthralgias.  Skin: Negative.  Negative for rash and wound.  Neurological: Negative for dizziness, weakness, light-headedness, numbness and headaches.  Hematological: Negative.   Psychiatric/Behavioral: Negative.     Allergies  Review of patient's allergies indicates no known allergies.  Home Medications   Current Outpatient Rx  Name Route Sig Dispense Refill  . ACETAMINOPHEN 325 MG PO TABS Oral Take 650 mg by mouth every 6 (six) hours as needed.    Marland Kitchen BISACODYL 10 MG RE SUPP Rectal Place 10 mg rectally as needed.    Marland Kitchen HYOSCYAMINE SULFATE 0.125 MG SL SUBL  Place one or two tablets under tongue every 4 hours if needed for relief of abdominal pain. 30 tablet 0  . INSULIN ASPART 100 UNIT/ML [place] SOLN Subcutaneous Inject 15 Units into the skin 3 (three) times daily before meals. Per sliding scale instructions: If 150-200= 1 unit, 201-250= 2 units, 251-300= 3 units, 301-350= 4 units, 351-400= 5 units, Greater than 400= 6 units// in addition to Sliding Scale    . INSULIN GLARGINE 100 UNIT/ML [place] SOLN Subcutaneous Inject 40 Units into the skin at bedtime.     Marland Kitchen LISINOPRIL 10 MG PO TABS Oral Take 10 mg by mouth every morning.     Marland Kitchen  POLYETHYLENE GLYCOL 3350 PO PACK Oral Take 17 g by mouth daily.    Marland Kitchen RANITIDINE HCL 150 MG PO CAPS Oral Take 150 mg by mouth 2 (two) times daily.      . SERTRALINE HCL 50 MG PO TABS Oral Take 150 mg by mouth every morning.     . SULFAMETHOXAZOLE-TMP DS 800-160 MG PO TABS Oral Take 1 tablet by mouth every evening. 30 tablet 1  . TAMSULOSIN HCL 0.4 MG PO CAPS Oral Take 0.4 mg by mouth every morning.     . TRAZODONE HCL 50 MG PO TABS Oral Take 50 mg by mouth at bedtime.      Marland Kitchen ZIPRASIDONE HCL 40 MG PO CAPS Oral Take 40 mg by mouth 2 (two) times daily with a meal.        BP 133/63  Pulse 96  Temp 98.6 F  (37 C)  Resp 21  Wt 165 lb (74.844 kg)  SpO2 100%  Physical Exam  Nursing note and vitals reviewed. Constitutional: He appears well-developed and well-nourished.  HENT:  Head: Normocephalic and atraumatic.  Eyes: Conjunctivae are normal.  Neck: Normal range of motion.  Cardiovascular: Normal rate, regular rhythm, normal heart sounds and intact distal pulses.   Pulmonary/Chest: Effort normal and breath sounds normal. He has no wheezes.  Abdominal: Soft. Bowel sounds are normal. There is no tenderness.  Genitourinary: Penis normal. No penile tenderness.       Foley is in place and appears clean and well cared for.  There is no sign of injury or trauma to the penis.  The catheter tube does have light yellow urine within the tube which is non-cloudy and without sediment or blood.  Musculoskeletal: Normal range of motion.  Neurological: He is alert.  Skin: Skin is warm and dry.  Psychiatric: He has a normal mood and affect.    ED Course  Procedures (including critical care time)  Labs Reviewed - No data to display No results found.   No diagnosis found.  10:17 AM patient still with complaint of penile pain after lidocaine jelly was applied to the catheter tubing.  Will replace Foley to see if this improves symptoms.   11:29 AM Pain resolved after Foley change will DC patient home. MDM          Evalee Jefferson, PA 01/03/12 1129

## 2012-01-03 NOTE — ED Notes (Signed)
Oscar Idol, PA at bedside 

## 2012-01-08 ENCOUNTER — Encounter (HOSPITAL_COMMUNITY): Payer: Self-pay | Admitting: *Deleted

## 2012-01-08 ENCOUNTER — Emergency Department (HOSPITAL_COMMUNITY)
Admission: EM | Admit: 2012-01-08 | Discharge: 2012-01-08 | Disposition: A | Discharge status: Skilled Nursing Facility | Payer: Medicaid Other | Attending: Emergency Medicine | Admitting: Emergency Medicine

## 2012-01-08 DIAGNOSIS — E119 Type 2 diabetes mellitus without complications: Secondary | ICD-10-CM | POA: Insufficient documentation

## 2012-01-08 DIAGNOSIS — E162 Hypoglycemia, unspecified: Secondary | ICD-10-CM

## 2012-01-08 DIAGNOSIS — F840 Autistic disorder: Secondary | ICD-10-CM | POA: Insufficient documentation

## 2012-01-08 DIAGNOSIS — Z794 Long term (current) use of insulin: Secondary | ICD-10-CM | POA: Insufficient documentation

## 2012-01-08 DIAGNOSIS — Z79899 Other long term (current) drug therapy: Secondary | ICD-10-CM | POA: Insufficient documentation

## 2012-01-08 DIAGNOSIS — F79 Unspecified intellectual disabilities: Secondary | ICD-10-CM | POA: Insufficient documentation

## 2012-01-08 DIAGNOSIS — N400 Enlarged prostate without lower urinary tract symptoms: Secondary | ICD-10-CM | POA: Insufficient documentation

## 2012-01-08 DIAGNOSIS — N289 Disorder of kidney and ureter, unspecified: Secondary | ICD-10-CM | POA: Insufficient documentation

## 2012-01-08 LAB — BASIC METABOLIC PANEL
CO2: 22 mEq/L (ref 19–32)
Calcium: 10.2 mg/dL (ref 8.4–10.5)
Creatinine, Ser: 1.45 mg/dL — ABNORMAL HIGH (ref 0.50–1.35)
Glucose, Bld: 113 mg/dL — ABNORMAL HIGH (ref 70–99)

## 2012-01-08 LAB — GLUCOSE, CAPILLARY: Glucose-Capillary: 124 mg/dL — ABNORMAL HIGH (ref 70–99)

## 2012-01-08 LAB — CBC
HCT: 33.6 % — ABNORMAL LOW (ref 39.0–52.0)
Hemoglobin: 10.7 g/dL — ABNORMAL LOW (ref 13.0–17.0)
MCH: 26.2 pg (ref 26.0–34.0)
MCV: 82.2 fL (ref 78.0–100.0)
RBC: 4.09 MIL/uL — ABNORMAL LOW (ref 4.22–5.81)

## 2012-01-08 NOTE — Discharge Instructions (Signed)
Please stop the standing novolog doses, only use sliding scale and lantus at night  Low Blood Sugar Low blood sugar (hypoglycemia) means that the level of sugar in your blood is lower than it should be. Signs of low blood sugar include:  Getting sweaty.   Feeling hungry.   Feeling dizzy or weak.   Feeling sleepier than normal.   Feeling nervous.   Headaches.   Having a fast heartbeat.  Low blood sugar can happen fast and can be an emergency. Your doctor can do tests to check your blood sugar level. You can have low blood sugar and not have diabetes. HOME CARE  Check your blood sugar as told by your doctor. If it is less than 70 mg/dl or as told by your doctor, take 1 of the following:   3 to 4 glucose tablets.    cup clear juice.    cup soda pop, not diet.   1 cup milk.   5 to 6 hard candies.   Recheck blood sugar after 15 minutes. Repeat until it is at the right level.   Eat a snack if it is more than 1 hour until the next meal.   Only take medicine as told by your doctor.   Do not skip meals. Eat on time.   Do not drink alcohol except with meals.   Check your blood glucose before driving.   Check your blood glucose before and after exercise.   Always carry treatment with you, such as glucose pills.   Always wear a medical alert bracelet if you have diabetes.  GET HELP RIGHT AWAY IF:   Your blood glucose goes below 70 mg/dl or as told by your doctor, and you:   Are confused.   Are not able to swallow.   Pass out (faint).   You cannot treat yourself. You may need someone to help you.   You have low blood sugar problems often.   You have problems from your medicines.   You are not feeling better after 3 to 4 days.   You have vision changes.  MAKE SURE YOU:   Understand these instructions.   Will watch this condition.   Will get help right away if you are not doing well or get worse.  Document Released: 11/14/2009 Document Revised:  08/09/2011 Document Reviewed: 11/14/2009 [facility] Patient Information 2012 [employer].

## 2012-01-08 NOTE — ED Notes (Signed)
broughti in from rest home for evaluation of low blood sugar, given d50 prior to arrival

## 2012-01-08 NOTE — ED Provider Notes (Signed)
History   This chart was scribed for Sharyon Cable, MD by Carolyne Littles. The patient was seen in room APA18/APA18. Patient's care was started at 1059.    CSN: [id number]  Arrival date & time 01/08/12  1059   First MD Initiated Contact with Patient 01/08/12 1105      Chief Complaint  Patient presents with  . Hypoglycemia     HPI [name] is a 51 y.o. male who presents to the Emergency Department for evaluation following sudden episode of moderate hypoglycemia this morning. Denies nausea, vomiting, diarrhea, chest pain, abdominal pain, SOB, coughing, visual changes. Patient's accompanying records indicate that patient was administered Lantus last night, 12 units of Novolog 4.5 hours ago and an additional 4 units of Novolog 3.5 hours ago. Patient with h/o renal disorder, autism, MR, gastroparesis, UTI, BPH, diabetes. Patient is non-verbal but able to signal yes or no to questions which is baseline for him.   Past Medical History  Diagnosis Date  . Renal disorder   . Elective mutism   . Autism   . Hyperosmolar (nonketotic) coma   . Hyperglycemia   . Diabetes mellitus   . Hyponatremia   . Mental retardation   . Urinary retention   . BPH (benign prostatic hyperplasia)   . Gastroparesis   . Depression   . Pneumonia   . UTI (urinary tract infection)   . Mental retardation     History reviewed. No pertinent past surgical history.  No family history on file.  History  Substance Use Topics  . Smoking status: Never Smoker   . Smokeless tobacco: Not on file  . Alcohol Use: No      Review of Systems A complete 10 system review of systems was obtained and all systems are negative except as noted in the HPI and PMH.   Allergies  Review of patient's allergies indicates no known allergies.  Home Medications   Current Outpatient Rx  Name Route Sig Dispense Refill  . ACETAMINOPHEN 325 MG PO TABS Oral Take 650 mg by mouth every 6 (six) hours as needed.    Marland Kitchen  BISACODYL 10 MG RE SUPP Rectal Place 10 mg rectally as needed.    Marland Kitchen HYOSCYAMINE SULFATE 0.125 MG SL SUBL  Place one or two tablets under tongue every 4 hours if needed for relief of abdominal pain. 30 tablet 0  . INSULIN ASPART 100 UNIT/ML [place] SOLN Subcutaneous Inject 12 Units into the skin 3 (three) times daily before meals. Per sliding scale instructions: If 150-200= 1 unit, 201-250= 2 units, 251-300= 3 units, 301-350= 4 units, 351-400= 5 units, Greater than 400= 6 units// in addition to Sliding Scale    . INSULIN GLARGINE 100 UNIT/ML [place] SOLN Subcutaneous Inject 40 Units into the skin at bedtime.     Marland Kitchen LISINOPRIL 10 MG PO TABS Oral Take 10 mg by mouth every morning.     Marland Kitchen POLYETHYLENE GLYCOL 3350 PO PACK Oral Take 17 g by mouth daily.    Marland Kitchen RANITIDINE HCL 150 MG PO CAPS Oral Take 150 mg by mouth 2 (two) times daily.      . SERTRALINE HCL 50 MG PO TABS Oral Take 150 mg by mouth every morning.     . SULFAMETHOXAZOLE-TMP DS 800-160 MG PO TABS Oral Take 1 tablet by mouth every evening. 30 tablet 1  . TAMSULOSIN HCL 0.4 MG PO CAPS Oral Take 0.4 mg by mouth every morning.     . TRAZODONE HCL 50 MG  PO TABS Oral Take 50 mg by mouth at bedtime.      Marland Kitchen ZIPRASIDONE HCL 40 MG PO CAPS Oral Take 40 mg by mouth 2 (two) times daily with a meal.        BP 165/89  Pulse 81  Temp(Src) 97.7 F (36.5 C) (Oral)  Resp 21  SpO2 100%  Physical Exam CONSTITUTIONAL: Well developed/well nourished HEAD AND FACE: Normocephalic/atraumatic EYES: EOMI/PERRL ENMT: Mucous membranes moist NECK: supple no meningeal signs SPINE:entire spine nontender CV: S1/S2 noted, no murmurs/rubs/gallops noted LUNGS: Lungs are clear to auscultation bilaterally, no apparent distress ABDOMEN: soft, nontender, no rebound or guarding GU: foley catheter in place NEURO: Pt is awake/alert, moves all extremitiesx4,  EXTREMITIES: pulses normal, full ROM SKIN: warm, color normal PSYCH: no abnormalities of mood, non-verbal but is patient's  baseline, able to signal yes or no to questions  ED Course  Procedures   DIAGNOSTIC STUDIES: Oxygen Saturation is 100% on room air, normal by my interpretation.    COORDINATION OF CARE: 11:35AM-Patient informed of current plan for treatment and evaluation and agrees with plan at this time.  12:21PM- Patient reports he is feeling well at this time.   3:05 PM Pt improved, well appearing, no distress, no further hypoglycemia Will stop his standing dose of novolog and only use SSI and lantus at night  Results for orders placed during the hospital encounter of 01/08/12  CBC      Component Value Range   WBC 4.2  4.0 - 10.5 (K/uL)   RBC 4.09 (*) 4.22 - 5.81 (MIL/uL)   Hemoglobin 10.7 (*) 13.0 - 17.0 (g/dL)   HCT 33.6 (*) 39.0 - 52.0 (%)   MCV 82.2  78.0 - 100.0 (fL)   MCH 26.2  26.0 - 34.0 (pg)   MCHC 31.8  30.0 - 36.0 (g/dL)   RDW 15.0  11.5 - 15.5 (%)   Platelets 187  150 - 400 (K/uL)  BASIC METABOLIC PANEL      Component Value Range   Sodium 137  135 - 145 (mEq/L)   Potassium 4.5  3.5 - 5.1 (mEq/L)   Chloride 104  96 - 112 (mEq/L)   CO2 22  19 - 32 (mEq/L)   Glucose, Bld 113 (*) 70 - 99 (mg/dL)   BUN 30 (*) 6 - 23 (mg/dL)   Creatinine, Ser 1.45 (*) 0.50 - 1.35 (mg/dL)   Calcium 10.2  8.4 - 10.5 (mg/dL)   GFR calc non Af Amer 55 (*) >90 (mL/min)   GFR calc Af Amer 64 (*) >90 (mL/min)  GLUCOSE, CAPILLARY      Component Value Range   Glucose-Capillary 138 (*) 70 - 99 (mg/dL)       MDM  Nursing notes reviewed and considered in documentation All labs/vitals reviewed and considered Previous records reviewed and considered       I personally performed the services described in this documentation, which was scribed in my presence. The recorded information has been reviewed and considered.      Sharyon Cable, MD 01/08/12 1505

## 2012-01-08 NOTE — ED Notes (Signed)
Gave patient 8oz of orange juice. Patient drank the juice with no issues, patient denies wanting more to drink.

## 2012-01-11 ENCOUNTER — Emergency Department (HOSPITAL_COMMUNITY)
Admission: EM | Admit: 2012-01-11 | Discharge: 2012-01-12 | Disposition: A | Discharge status: Home / Self Care | Payer: Medicaid Other | Attending: Emergency Medicine | Admitting: Emergency Medicine

## 2012-01-11 ENCOUNTER — Encounter (HOSPITAL_COMMUNITY): Payer: Self-pay | Admitting: *Deleted

## 2012-01-11 DIAGNOSIS — N400 Enlarged prostate without lower urinary tract symptoms: Secondary | ICD-10-CM | POA: Insufficient documentation

## 2012-01-11 DIAGNOSIS — F79 Unspecified intellectual disabilities: Secondary | ICD-10-CM | POA: Insufficient documentation

## 2012-01-11 DIAGNOSIS — E119 Type 2 diabetes mellitus without complications: Secondary | ICD-10-CM | POA: Insufficient documentation

## 2012-01-11 DIAGNOSIS — F840 Autistic disorder: Secondary | ICD-10-CM | POA: Insufficient documentation

## 2012-01-11 DIAGNOSIS — Z79899 Other long term (current) drug therapy: Secondary | ICD-10-CM | POA: Insufficient documentation

## 2012-01-11 DIAGNOSIS — Z794 Long term (current) use of insulin: Secondary | ICD-10-CM | POA: Insufficient documentation

## 2012-01-11 LAB — URINALYSIS, ROUTINE W REFLEX MICROSCOPIC
Glucose, UA: >1000 mg/dL — AB
Nitrite: NEGATIVE
Protein, ur: NEGATIVE mg/dL
pH: 6.5 (ref 5.0–8.0)

## 2012-01-11 LAB — URINE MICROSCOPIC-ADD ON

## 2012-01-11 MED ORDER — INSULIN ASPART 100 UNIT/ML ~~LOC~~ SOLN: 15.0000 [IU] | Freq: Once | SUBCUTANEOUS | Status: DC

## 2012-01-11 NOTE — Discharge Instructions (Signed)
Urine is now flowing freely and the catheter. Followup with your urologist as necessary

## 2012-01-11 NOTE — ED Notes (Signed)
Foley irrigated with 581ml per MD and 557ml returned

## 2012-01-11 NOTE — ED Provider Notes (Signed)
History     CSN: [id number]  Arrival date & time 01/11/12  2019   First MD Initiated Contact with Patient 01/11/12 2126      Chief Complaint  Patient presents with  . Dysuria    (Consider location/radiation/quality/duration/timing/severity/associated sxs/prior treatment) HPI Comments: [name] is a 51 y.o. Male was sent here for a problem with his Foley catheter. Apparently, it is clogged. The patient has a limited ability to communicate, secondary to Mental Retardation and Elective Autism. He can answer simple questions with yes and no, head movements. When asked if he has a problem he points to his catheter. He denies abdomen or back pain. He states that he has been eating well. He denies having had a fever. He had a similar problem 8 days ago. Was seen here and had his catheter replaced. There is no clear evidence for urinary retention at that time. He was also seen here 3 days ago for hypoglycemia. His insulin treatment was changed to sliding scale during the day with Lantus in the evenings.  Patient is a 51 y.o. male presenting with dysuria.  Dysuria     Past Medical History  Diagnosis Date  . Renal disorder   . Elective mutism   . Autism   . Hyperosmolar (nonketotic) coma   . Hyperglycemia   . Diabetes mellitus   . Hyponatremia   . Mental retardation   . Urinary retention   . BPH (benign prostatic hyperplasia)   . Gastroparesis   . Depression   . Pneumonia   . UTI (urinary tract infection)   . Mental retardation     History reviewed. No pertinent past surgical history.  History reviewed. No pertinent family history.  History  Substance Use Topics  . Smoking status: Never Smoker   . Smokeless tobacco: Not on file  . Alcohol Use: No      Review of Systems  Genitourinary: Positive for dysuria.  All other systems reviewed and are negative.    Allergies  Review of patient's allergies indicates no known allergies.  Home Medications   Current  Outpatient Rx  Name Route Sig Dispense Refill  . ACETAMINOPHEN 325 MG PO TABS Oral Take 650 mg by mouth every 6 (six) hours as needed. For pain    . HYOSCYAMINE SULFATE 0.125 MG SL SUBL Oral Take 0.125-0.25 mg by mouth every 4 (four) hours as needed. Place one or two tablets under tongue every 4 hours if needed for relief of abdominal pain.    . INSULIN ASPART 100 UNIT/ML [place] SOLN Subcutaneous Inject 1-6 Units into the skin 3 (three) times daily as needed. Per sliding scale instructions: If 150-200= 1 unit, 201-250= 2 units, 251-300= 3 units, 301-350= 4 units, 351-400= 5 units, Greater than 400= 6 units// in addition to Sliding Scale    . INSULIN GLARGINE 100 UNIT/ML [place] SOLN Subcutaneous Inject 40 Units into the skin at bedtime.     Marland Kitchen LISINOPRIL 10 MG PO TABS Oral Take 10 mg by mouth every morning.     Marland Kitchen POLYETHYLENE GLYCOL 3350 PO PACK Oral Take 17 g by mouth every morning.     Marland Kitchen RANITIDINE HCL 150 MG PO CAPS Oral Take 150 mg by mouth 2 (two) times daily.      . SERTRALINE HCL 50 MG PO TABS Oral Take 150 mg by mouth every morning.     . SULFAMETHOXAZOLE-TMP DS 800-160 MG PO TABS Oral Take 1 tablet by mouth every evening. 30 tablet 1  .  TAMSULOSIN HCL 0.4 MG PO CAPS Oral Take 0.4 mg by mouth every morning.     . TRAZODONE HCL 50 MG PO TABS Oral Take 50 mg by mouth at bedtime.      Marland Kitchen ZIPRASIDONE HCL 40 MG PO CAPS Oral Take 40 mg by mouth 2 (two) times daily with a meal.        BP 125/66  Pulse 75  Temp(Src) 97.3 F (36.3 C) (Oral)  Resp 18  Ht 5\' 10"  (1.778 m)  Wt 150 lb (68.04 kg)  BMI 21.52 kg/m2  SpO2 100%  Physical Exam  Nursing note and vitals reviewed. Constitutional: He appears well-developed and well-nourished. No distress.  HENT:  Head: Normocephalic and atraumatic.  Right Ear: External ear normal.  Left Ear: External ear normal.  Eyes: Conjunctivae and EOM are normal. Pupils are equal, round, and reactive to light.  Neck: Normal range of motion and phonation normal. Neck  supple.  Cardiovascular: Normal rate, regular rhythm, normal heart sounds and intact distal pulses.   Pulmonary/Chest: Effort normal and breath sounds normal. He exhibits no bony tenderness.  Abdominal: Soft. Normal appearance and bowel sounds are normal. There is no tenderness. There is no guarding.  Genitourinary:       Foley in penis, appears normal.  Musculoskeletal: Normal range of motion.  Neurological: He is alert. He has normal strength. No cranial nerve deficit or sensory deficit. He exhibits normal muscle tone. Coordination normal.  Skin: Skin is warm, dry and intact.  Psychiatric: He has a normal mood and affect. His behavior is normal.    ED Course  Procedures (including critical care time)  Emergency department treatment: Urine sample obtained. Foley catheter irrigated with saline by the nurse.       Labs Reviewed - No data to display No results found.   1. Prostate hypertrophy       MDM  Recurrent complaints with foley catheter.       22:09-care to Dr. Lacinda Axon to evaluate after urinalysis returns.  Richarda Blade, MD 01/11/12 [phone]

## 2012-01-11 NOTE — ED Notes (Signed)
Sent from [facility] Has foley in place and aid with him thinks he was sent for problems with catheter, but she does not know for sure.

## 2012-01-12 NOTE — ED Notes (Signed)
[facility] called and advised they would send Caren Griffins to pick up patient within 30 min.

## 2012-01-13 LAB — URINE CULTURE: Colony Count: >=100000

## 2012-01-21 ENCOUNTER — Emergency Department (HOSPITAL_COMMUNITY): Payer: Medicaid Other

## 2012-01-21 ENCOUNTER — Encounter (HOSPITAL_COMMUNITY): Payer: Self-pay | Admitting: *Deleted

## 2012-01-21 ENCOUNTER — Inpatient Hospital Stay (HOSPITAL_COMMUNITY)
Admission: EM | Admit: 2012-01-21 | Discharge: 2012-02-23 | DRG: 870 | Disposition: A | Discharge status: Skilled Nursing Facility | Payer: Medicaid Other | Attending: Internal Medicine | Admitting: Internal Medicine

## 2012-01-21 DIAGNOSIS — J9600 Acute respiratory failure, unspecified whether with hypoxia or hypercapnia: Secondary | ICD-10-CM | POA: Diagnosis present

## 2012-01-21 DIAGNOSIS — E1010 Type 1 diabetes mellitus with ketoacidosis without coma: Secondary | ICD-10-CM | POA: Diagnosis present

## 2012-01-21 DIAGNOSIS — Z794 Long term (current) use of insulin: Secondary | ICD-10-CM

## 2012-01-21 DIAGNOSIS — IMO0002 Reserved for concepts with insufficient information to code with codable children: Secondary | ICD-10-CM | POA: Diagnosis not present

## 2012-01-21 DIAGNOSIS — E1029 Type 1 diabetes mellitus with other diabetic kidney complication: Secondary | ICD-10-CM | POA: Diagnosis present

## 2012-01-21 DIAGNOSIS — N320 Bladder-neck obstruction: Secondary | ICD-10-CM | POA: Diagnosis present

## 2012-01-21 DIAGNOSIS — N183 Chronic kidney disease, stage 3 unspecified: Secondary | ICD-10-CM | POA: Diagnosis present

## 2012-01-21 DIAGNOSIS — J9690 Respiratory failure, unspecified, unspecified whether with hypoxia or hypercapnia: Secondary | ICD-10-CM | POA: Diagnosis not present

## 2012-01-21 DIAGNOSIS — D509 Iron deficiency anemia, unspecified: Secondary | ICD-10-CM | POA: Diagnosis present

## 2012-01-21 DIAGNOSIS — N400 Enlarged prostate without lower urinary tract symptoms: Secondary | ICD-10-CM | POA: Diagnosis present

## 2012-01-21 DIAGNOSIS — I129 Hypertensive chronic kidney disease with stage 1 through stage 4 chronic kidney disease, or unspecified chronic kidney disease: Secondary | ICD-10-CM | POA: Diagnosis present

## 2012-01-21 DIAGNOSIS — G9341 Metabolic encephalopathy: Secondary | ICD-10-CM | POA: Diagnosis not present

## 2012-01-21 DIAGNOSIS — J690 Pneumonitis due to inhalation of food and vomit: Secondary | ICD-10-CM | POA: Diagnosis present

## 2012-01-21 DIAGNOSIS — D72829 Elevated white blood cell count, unspecified: Secondary | ICD-10-CM | POA: Diagnosis present

## 2012-01-21 DIAGNOSIS — E1165 Type 2 diabetes mellitus with hyperglycemia: Secondary | ICD-10-CM | POA: Diagnosis not present

## 2012-01-21 DIAGNOSIS — R1310 Dysphagia, unspecified: Secondary | ICD-10-CM | POA: Diagnosis present

## 2012-01-21 DIAGNOSIS — A419 Sepsis, unspecified organism: Principal | ICD-10-CM | POA: Diagnosis present

## 2012-01-21 DIAGNOSIS — E162 Hypoglycemia, unspecified: Secondary | ICD-10-CM

## 2012-01-21 DIAGNOSIS — Z79899 Other long term (current) drug therapy: Secondary | ICD-10-CM

## 2012-01-21 DIAGNOSIS — N390 Urinary tract infection, site not specified: Secondary | ICD-10-CM | POA: Diagnosis present

## 2012-01-21 DIAGNOSIS — N179 Acute kidney failure, unspecified: Secondary | ICD-10-CM | POA: Diagnosis present

## 2012-01-21 DIAGNOSIS — F79 Unspecified intellectual disabilities: Secondary | ICD-10-CM | POA: Diagnosis present

## 2012-01-21 DIAGNOSIS — E46 Unspecified protein-calorie malnutrition: Secondary | ICD-10-CM | POA: Diagnosis present

## 2012-01-21 DIAGNOSIS — R6510 Systemic inflammatory response syndrome (SIRS) of non-infectious origin without acute organ dysfunction: Secondary | ICD-10-CM | POA: Diagnosis present

## 2012-01-21 DIAGNOSIS — E1065 Type 1 diabetes mellitus with hyperglycemia: Secondary | ICD-10-CM | POA: Diagnosis present

## 2012-01-21 DIAGNOSIS — R627 Adult failure to thrive: Secondary | ICD-10-CM | POA: Diagnosis present

## 2012-01-21 DIAGNOSIS — J189 Pneumonia, unspecified organism: Secondary | ICD-10-CM | POA: Diagnosis present

## 2012-01-21 LAB — CBC
Platelets: 188 10*3/uL (ref 150–400)
RBC: 3.62 MIL/uL — ABNORMAL LOW (ref 4.22–5.81)
WBC: 6.1 10*3/uL (ref 4.0–10.5)

## 2012-01-21 LAB — GLUCOSE, CAPILLARY
Glucose-Capillary: 85 mg/dL (ref 70–99)
Glucose-Capillary: 40 mg/dL — CL (ref 70–99)

## 2012-01-21 LAB — URINALYSIS, ROUTINE W REFLEX MICROSCOPIC
Nitrite: NEGATIVE
Specific Gravity, Urine: >1.03 — ABNORMAL HIGH (ref 1.005–1.030)
Urobilinogen, UA: 0.2 mg/dL (ref 0.0–1.0)

## 2012-01-21 LAB — DIFFERENTIAL
Basophils Absolute: 0 10*3/uL (ref 0.0–0.1)
Eosinophils Absolute: 0.1 10*3/uL (ref 0.0–0.7)
Lymphocytes Relative: 14 % (ref 12–46)
Lymphs Abs: 0.8 10*3/uL (ref 0.7–4.0)
Neutrophils Relative %: 78 % — ABNORMAL HIGH (ref 43–77)

## 2012-01-21 LAB — URINE MICROSCOPIC-ADD ON

## 2012-01-21 LAB — BASIC METABOLIC PANEL
BUN: 36 mg/dL — ABNORMAL HIGH (ref 6–23)
Chloride: 106 mEq/L (ref 96–112)
GFR calc Af Amer: 52 mL/min — ABNORMAL LOW (ref >90)
Potassium: 4.4 mEq/L (ref 3.5–5.1)

## 2012-01-21 LAB — TROPONIN I: Troponin I: <0.3 ng/mL (ref <0.30)

## 2012-01-21 LAB — HEPATIC FUNCTION PANEL
ALT: 29 U/L (ref 0–53)
Albumin: 3.8 g/dL (ref 3.5–5.2)
Alkaline Phosphatase: 74 U/L (ref 39–117)
Total Protein: 7.8 g/dL (ref 6.0–8.3)

## 2012-01-21 MED ORDER — TAMSULOSIN HCL 0.4 MG PO CAPS
0.4000 mg | ORAL_CAPSULE | Freq: Every day | ORAL | Status: DC
  Administered 2012-01-22 – 2012-02-22 (×29): 0.4 mg via ORAL
  Filled 2012-01-21 (×30): qty 1

## 2012-01-21 MED ORDER — DEXTROSE 50 % IV SOLN
1.0000 | Freq: Once | INTRAVENOUS | Status: AC
  Administered 2012-01-21: 50 mL via INTRAVENOUS

## 2012-01-21 MED ORDER — DEXTROSE 5 % IV SOLN
1.0000 g | INTRAVENOUS | Status: DC
  Administered 2012-01-22 – 2012-01-29 (×8): 1 g via INTRAVENOUS
  Filled 2012-01-21 (×8): qty 10

## 2012-01-21 MED ORDER — ENOXAPARIN SODIUM 40 MG/0.4ML ~~LOC~~ SOLN
40.0000 mg | SUBCUTANEOUS | Status: DC
  Administered 2012-01-21 – 2012-01-29 (×9): 40 mg via SUBCUTANEOUS
  Filled 2012-01-21 (×9): qty 0.4

## 2012-01-21 MED ORDER — ZIPRASIDONE HCL 40 MG PO CAPS
40.0000 mg | ORAL_CAPSULE | Freq: Two times a day (BID) | ORAL | Status: DC
  Administered 2012-01-22 – 2012-02-23 (×59): 40 mg via ORAL
  Filled 2012-01-21 (×78): qty 1

## 2012-01-21 MED ORDER — POLYETHYLENE GLYCOL 3350 17 G PO PACK
17.0000 g | PACK | Freq: Every morning | ORAL | Status: DC
  Administered 2012-01-23 – 2012-02-23 (×24): 17 g via ORAL
  Filled 2012-01-21 (×25): qty 1

## 2012-01-21 MED ORDER — FAMOTIDINE 20 MG PO TABS
20.0000 mg | ORAL_TABLET | Freq: Every day | ORAL | Status: DC
  Administered 2012-01-22 – 2012-02-19 (×27): 20 mg via ORAL
  Filled 2012-01-21 (×27): qty 1

## 2012-01-21 MED ORDER — DEXTROSE 5 % IV SOLN
500.0000 mg | INTRAVENOUS | Status: DC
  Administered 2012-01-22 – 2012-01-23 (×2): 500 mg via INTRAVENOUS
  Filled 2012-01-21 (×3): qty 500

## 2012-01-21 MED ORDER — DEXTROSE 5 % IV SOLN
INTRAVENOUS | Status: DC
  Administered 2012-01-21: 17:00:00 via INTRAVENOUS

## 2012-01-21 MED ORDER — TRAZODONE HCL 50 MG PO TABS
50.0000 mg | ORAL_TABLET | Freq: Every day | ORAL | Status: DC
  Administered 2012-01-21 – 2012-02-22 (×23): 50 mg via ORAL
  Filled 2012-01-21 (×23): qty 1

## 2012-01-21 MED ORDER — LISINOPRIL 10 MG PO TABS
10.0000 mg | ORAL_TABLET | Freq: Every day | ORAL | Status: DC
  Administered 2012-01-22 – 2012-01-29 (×8): 10 mg via ORAL
  Filled 2012-01-21 (×8): qty 1

## 2012-01-21 MED ORDER — CEFTRIAXONE SODIUM 1 G IJ SOLR
1.0000 g | Freq: Once | INTRAMUSCULAR | Status: AC
  Administered 2012-01-22: 1 g via INTRAVENOUS
  Filled 2012-01-21: qty 10

## 2012-01-21 MED ORDER — INSULIN ASPART 100 UNIT/ML ~~LOC~~ SOLN: 0.0000 [IU] | Freq: Three times a day (TID) | SUBCUTANEOUS | Status: DC

## 2012-01-21 MED ORDER — DEXTROSE 50 % IV SOLN
INTRAVENOUS | Status: AC
  Administered 2012-01-21: 50 mL via INTRAVENOUS
  Filled 2012-01-21: qty 50

## 2012-01-21 MED ORDER — ACETAMINOPHEN 325 MG PO TABS
650.0000 mg | ORAL_TABLET | Freq: Four times a day (QID) | ORAL | Status: DC | PRN
  Administered 2012-01-21 – 2012-02-20 (×15): 650 mg via ORAL
  Filled 2012-01-21 (×16): qty 2

## 2012-01-21 MED ORDER — DEXTROSE 5 % IV SOLN
1.0000 g | Freq: Once | INTRAVENOUS | Status: AC
  Administered 2012-01-21: 1 g via INTRAVENOUS
  Filled 2012-01-21: qty 10

## 2012-01-21 MED ORDER — ONDANSETRON HCL 4 MG/2ML IJ SOLN
4.0000 mg | Freq: Once | INTRAMUSCULAR | Status: AC
  Administered 2012-01-21: 4 mg via INTRAVENOUS
  Filled 2012-01-21: qty 2

## 2012-01-21 MED ORDER — AZITHROMYCIN 500 MG IV SOLR
500.0000 mg | Freq: Once | INTRAVENOUS | Status: AC
  Administered 2012-01-21: 500 mg via INTRAVENOUS
  Filled 2012-01-21: qty 500

## 2012-01-21 MED ORDER — ZIPRASIDONE HCL 40 MG PO CAPS
ORAL_CAPSULE | ORAL | Status: AC
  Filled 2012-01-21: qty 1

## 2012-01-21 MED ORDER — SERTRALINE HCL 50 MG PO TABS
150.0000 mg | ORAL_TABLET | Freq: Every day | ORAL | Status: DC
  Administered 2012-01-22 – 2012-01-29 (×8): 150 mg via ORAL
  Filled 2012-01-21 (×8): qty 3

## 2012-01-21 NOTE — ED Provider Notes (Signed)
History   This chart was scribed for Nat Christen, MD by Carolyne Littles. The patient was seen in room APA12/APA12. Patient's care was started at 1117.    CSN: [id number]  Arrival date & time 01/21/12  1117   First MD Initiated Contact with Patient 01/21/12 1158      Chief Complaint  Patient presents with  . Hypoglycemia    (Consider location/radiation/quality/duration/timing/severity/associated sxs/prior treatment) HPI Level 5 Caveat Applies due to Mental Status (h/o MR) MARCELLE KOMOROSKI is a 51 y.o. male who presents to the Emergency Department from [place] facility for evaluation of hypoglycemia. Per nursing facility, patient was given sliding scale insulin this morning and blood glucose level was measured at 57 following. Patient was provided 1 amp D50 and blood glucose level increased to 290. Patient with h/o diabetes, renal disorder, mental retardation, UTI.   Past Medical History  Diagnosis Date  . Renal disorder   . Elective mutism   . Autism   . Hyperosmolar (nonketotic) coma   . Hyperglycemia   . Diabetes mellitus   . Hyponatremia   . Mental retardation   . Urinary retention   . BPH (benign prostatic hyperplasia)   . Gastroparesis   . Depression   . Pneumonia   . UTI (urinary tract infection)   . Mental retardation     History reviewed. No pertinent past surgical history.  No family history on file.  History  Substance Use Topics  . Smoking status: Never Smoker   . Smokeless tobacco: Not on file  . Alcohol Use: No      Review of Systems  Unable to perform ROS: Other    Allergies  Review of patient's allergies indicates no known allergies.  Home Medications   Current Outpatient Rx  Name Route Sig Dispense Refill  . INSULIN ASPART 100 UNIT/ML [place] SOLN Subcutaneous Inject 1-6 Units into the skin 3 (three) times daily as needed. Per sliding scale instructions: If 150-200= 1 unit, 201-250= 2 units, 251-300= 3 units, 301-350= 4 units,  351-400= 5 units, Greater than 400= 6 units// in addition to Sliding Scale    . INSULIN GLARGINE 100 UNIT/ML [place] SOLN Subcutaneous Inject 30 Units into the skin at bedtime.     Marland Kitchen LISINOPRIL 10 MG PO TABS Oral Take 10 mg by mouth every morning.     Marland Kitchen POLYETHYLENE GLYCOL 3350 PO PACK Oral Take 17 g by mouth every morning.     Marland Kitchen RANITIDINE HCL 150 MG PO CAPS Oral Take 150 mg by mouth 2 (two) times daily.      . SERTRALINE HCL 50 MG PO TABS Oral Take 150 mg by mouth every morning.     . SULFAMETHOXAZOLE-TMP DS 800-160 MG PO TABS Oral Take 1 tablet by mouth every evening. 30 tablet 1  . TAMSULOSIN HCL 0.4 MG PO CAPS Oral Take 0.4 mg by mouth every morning.     . TRAZODONE HCL 50 MG PO TABS Oral Take 50 mg by mouth at bedtime.      Marland Kitchen ZIPRASIDONE HCL 40 MG PO CAPS Oral Take 40 mg by mouth 2 (two) times daily with a meal.      . ACETAMINOPHEN 325 MG PO TABS Oral Take 650 mg by mouth every 6 (six) hours as needed. For pain    . HYOSCYAMINE SULFATE 0.125 MG SL SUBL Oral Take 0.125-0.25 mg by mouth every 4 (four) hours as needed. Place one or two tablets under tongue every 4 hours if needed  for relief of abdominal pain.      BP 128/58  Pulse 81  Temp(Src) 97.9 F (36.6 C) (Oral)  Resp 20  SpO2 98%  Physical Exam  Nursing note and vitals reviewed. Constitutional: He appears well-developed and well-nourished.       Uncomfortable appearing.   HENT:  Head: Normocephalic and atraumatic.  Eyes: EOM are normal. Pupils are equal, round, and reactive to light.  Neck: Neck supple. No tracheal deviation present.  Cardiovascular: Normal rate and regular rhythm.   Pulmonary/Chest: Effort normal. No respiratory distress.  Abdominal: He exhibits no distension.  Musculoskeletal: Normal range of motion. He exhibits no edema.  Neurological: He is alert. No sensory deficit.  Skin: Skin is warm and dry.  Psychiatric:       Able to signal answer to yes and no questions otherwise uncommunicative.     ED Course   Procedures (including critical care time)  DIAGNOSTIC STUDIES: Oxygen Saturation is 85% on room air, hypoxic by my interpretation.    COORDINATION OF CARE: 12:18PM- Will order CXR, UA and blood labs.   Results for orders placed during the hospital encounter of 0000000  BASIC METABOLIC PANEL      Component Value Range   Sodium 140  135 - 145 (mEq/L)   Potassium 4.4  3.5 - 5.1 (mEq/L)   Chloride 106  96 - 112 (mEq/L)   CO2 23  19 - 32 (mEq/L)   Glucose, Bld 57 (*) 70 - 99 (mg/dL)   BUN 36 (*) 6 - 23 (mg/dL)   Creatinine, Ser 1.71 (*) 0.50 - 1.35 (mg/dL)   Calcium 10.2  8.4 - 10.5 (mg/dL)   GFR calc non Af Amer 45 (*) >90 (mL/min)   GFR calc Af Amer 52 (*) >90 (mL/min)  GLUCOSE, CAPILLARY      Component Value Range   Glucose-Capillary 85  70 - 99 (mg/dL)  URINALYSIS, ROUTINE W REFLEX MICROSCOPIC      Component Value Range   Color, Urine AMBER (*) YELLOW    APPearance CLEAR  CLEAR    Specific Gravity, Urine >1.030 (*) 1.005 - 1.030    pH 6.0  5.0 - 8.0    Glucose, UA NEGATIVE  NEGATIVE (mg/dL)   Hgb urine dipstick NEGATIVE  NEGATIVE    Bilirubin Urine NEGATIVE  NEGATIVE    Ketones, ur NEGATIVE  NEGATIVE (mg/dL)   Protein, ur TRACE (*) NEGATIVE (mg/dL)   Urobilinogen, UA 0.2  0.0 - 1.0 (mg/dL)   Nitrite NEGATIVE  NEGATIVE    Leukocytes, UA SMALL (*) NEGATIVE   CBC      Component Value Range   WBC 6.1  4.0 - 10.5 (K/uL)   RBC 3.62 (*) 4.22 - 5.81 (MIL/uL)   Hemoglobin 9.7 (*) 13.0 - 17.0 (g/dL)   HCT 29.7 (*) 39.0 - 52.0 (%)   MCV 82.0  78.0 - 100.0 (fL)   MCH 26.8  26.0 - 34.0 (pg)   MCHC 32.7  30.0 - 36.0 (g/dL)   RDW 14.6  11.5 - 15.5 (%)   Platelets 188  150 - 400 (K/uL)  DIFFERENTIAL      Component Value Range   Neutrophils Relative 78 (*) 43 - 77 (%)   Neutro Abs 4.8  1.7 - 7.7 (K/uL)   Lymphocytes Relative 14  12 - 46 (%)   Lymphs Abs 0.8  0.7 - 4.0 (K/uL)   Monocytes Relative 6  3 - 12 (%)   Monocytes Absolute 0.4  0.1 - 1.0 (  K/uL)   Eosinophils Relative  2  0 - 5 (%)   Eosinophils Absolute 0.1  0.0 - 0.7 (K/uL)   Basophils Relative 1  0 - 1 (%)   Basophils Absolute 0.0  0.0 - 0.1 (K/uL)  HEPATIC FUNCTION PANEL      Component Value Range   Total Protein 7.8  6.0 - 8.3 (g/dL)   Albumin 3.8  3.5 - 5.2 (g/dL)   AST 28  0 - 37 (U/L)   ALT 29  0 - 53 (U/L)   Alkaline Phosphatase 74  39 - 117 (U/L)   Total Bilirubin 0.3  0.3 - 1.2 (mg/dL)   Bilirubin, Direct 0.1  0.0 - 0.3 (mg/dL)   Indirect Bilirubin 0.2 (*) 0.3 - 0.9 (mg/dL)  GLUCOSE, CAPILLARY      Component Value Range   Glucose-Capillary 40 (*) 70 - 99 (mg/dL)   Comment 1 Documented in Chart     Comment 2 Notify RN    URINE MICROSCOPIC-ADD ON      Component Value Range   WBC, UA 7-10  <3 (WBC/hpf)   Urine-Other MANY YEAST    GLUCOSE, CAPILLARY      Component Value Range   Glucose-Capillary 148 (*) 70 - 99 (mg/dL)  GLUCOSE, CAPILLARY      Component Value Range   Glucose-Capillary 138 (*) 70 - 99 (mg/dL)  TROPONIN I      Component Value Range   Troponin I <0.30  <0.30 (ng/mL)    Labs Reviewed  BASIC METABOLIC PANEL - Abnormal; Notable for the following:    Glucose, Bld 57 (*)    BUN 36 (*)    Creatinine, Ser 1.71 (*)    GFR calc non Af Amer 45 (*)    GFR calc Af Amer 52 (*)    All other components within normal limits  URINALYSIS, ROUTINE W REFLEX MICROSCOPIC - Abnormal; Notable for the following:    Color, Urine AMBER (*) BIOCHEMICALS MAY BE AFFECTED BY COLOR   Specific Gravity, Urine >1.030 (*)    Protein, ur TRACE (*)    Leukocytes, UA SMALL (*)    All other components within normal limits  CBC - Abnormal; Notable for the following:    RBC 3.62 (*)    Hemoglobin 9.7 (*)    HCT 29.7 (*)    All other components within normal limits  DIFFERENTIAL - Abnormal; Notable for the following:    Neutrophils Relative 78 (*)    All other components within normal limits  HEPATIC FUNCTION PANEL - Abnormal; Notable for the following:    Indirect Bilirubin 0.2 (*)    All other  components within normal limits  GLUCOSE, CAPILLARY - Abnormal; Notable for the following:    Glucose-Capillary 40 (*)    All other components within normal limits  GLUCOSE, CAPILLARY - Abnormal; Notable for the following:    Glucose-Capillary 148 (*)    All other components within normal limits  GLUCOSE, CAPILLARY  URINE MICROSCOPIC-ADD ON   Dg Chest Port 1 View  01/21/2012  *RADIOLOGY REPORT*  Clinical Data: Possible pneumonia.  PORTABLE CHEST - 1 VIEW  Comparison: Plain film of the chest 09/18/2011, 10/22/2011.  Findings: Hazy bilateral pulmonary opacities are identified.  Heart size is normal.  No pneumothorax or pleural fluid.  IMPRESSION: Bilateral airspace disease compatible with pneumonia, possibly atypical.  Original Report Authenticated By: Arvid Right. Luther Parody, M.D.     No diagnosis found. CRITICAL CARE Performed by: Nat Christen   Total critical care  time: 30  Critical care time was exclusive of separately billable procedures and treating other patients.  Critical care was necessary to treat or prevent imminent or life-threatening deterioration.  Critical care was time spent personally by me on the following activities: development of treatment plan with patient and/or surrogate as well as nursing, discussions with consultants, evaluation of patient's response to treatment, examination of patient, obtaining history from patient or surrogate, ordering and performing treatments and interventions, ordering and review of laboratory studies, ordering and review of radiographic studies, pulse oximetry and re-evaluation of patient's condition.   MDM   Initial presentation showed a low glucose. Further evaluation revealed a  likely bilateral pneumonia.  IV Rocephin and Zithromax started at primary care doctor's request.  Admit   I personally performed the services described in this documentation, which was scribed in my presence. The recorded information has been reviewed and  considered.    Nat Christen, MD 01/21/12 [phone]

## 2012-01-21 NOTE — ED Notes (Signed)
Pt had cbg of 207 this morning. Sliding scale insulin given and cbg went down to 57. EMS gave 1 amp D50 and cbg increased to 290. Pt is drowsy with low O2 sats on RA. Diaphoretic.

## 2012-01-21 NOTE — ED Notes (Signed)
Beeped Dr. Legrand Rams to [phone].

## 2012-01-21 NOTE — ED Notes (Signed)
Floor unable to take report at this time.

## 2012-01-21 NOTE — ED Notes (Signed)
Pt given OJ with 2 packets of sugar.  Pt drank and tolerated well.

## 2012-01-21 NOTE — Progress Notes (Signed)
Received report from christy edwards rn at this time

## 2012-01-22 LAB — BASIC METABOLIC PANEL
BUN: 49 mg/dL — ABNORMAL HIGH (ref 6–23)
CO2: 18 mEq/L — ABNORMAL LOW (ref 19–32)
Chloride: 96 mEq/L (ref 96–112)
Creatinine, Ser: 2.35 mg/dL — ABNORMAL HIGH (ref 0.50–1.35)
Glucose, Bld: 331 mg/dL — ABNORMAL HIGH (ref 70–99)

## 2012-01-22 LAB — CBC
HCT: 30.2 % — ABNORMAL LOW (ref 39.0–52.0)
Hemoglobin: 10 g/dL — ABNORMAL LOW (ref 13.0–17.0)
MCV: 81 fL (ref 78.0–100.0)
RDW: 14.7 % (ref 11.5–15.5)
WBC: 10.9 10*3/uL — ABNORMAL HIGH (ref 4.0–10.5)

## 2012-01-22 LAB — GLUCOSE, CAPILLARY
Glucose-Capillary: 392 mg/dL — ABNORMAL HIGH (ref 70–99)
Glucose-Capillary: 130 mg/dL — ABNORMAL HIGH (ref 70–99)

## 2012-01-22 LAB — MRSA PCR SCREENING: MRSA by PCR: INVALID — AB

## 2012-01-22 MED ORDER — INSULIN ASPART 100 UNIT/ML ~~LOC~~ SOLN: 0.0000 [IU] | Freq: Every day | SUBCUTANEOUS | Status: DC

## 2012-01-22 MED ORDER — SODIUM CHLORIDE 0.9 % IV SOLN
INTRAVENOUS | Status: DC
  Administered 2012-01-22: 1000 mL via INTRAVENOUS
  Administered 2012-01-22: 23:00:00 via INTRAVENOUS
  Administered 2012-01-22 – 2012-01-23 (×3): 1000 mL via INTRAVENOUS
  Administered 2012-01-24 – 2012-01-27 (×5): via INTRAVENOUS
  Administered 2012-01-28: 1000 mL via INTRAVENOUS

## 2012-01-22 MED ORDER — INSULIN ASPART 100 UNIT/ML ~~LOC~~ SOLN
8.0000 [IU] | Freq: Three times a day (TID) | SUBCUTANEOUS | Status: DC
  Administered 2012-01-22 – 2012-01-23 (×2): 8 [IU] via SUBCUTANEOUS

## 2012-01-22 MED ORDER — MORPHINE SULFATE 2 MG/ML IJ SOLN
2.0000 mg | INTRAMUSCULAR | Status: DC | PRN
  Administered 2012-01-22 – 2012-02-23 (×53): 2 mg via INTRAVENOUS
  Filled 2012-01-22 (×57): qty 1

## 2012-01-22 MED ORDER — INSULIN ASPART 100 UNIT/ML ~~LOC~~ SOLN
0.0000 [IU] | Freq: Three times a day (TID) | SUBCUTANEOUS | Status: DC
  Administered 2012-01-22: 11 [IU] via SUBCUTANEOUS
  Administered 2012-01-22: 15 [IU] via SUBCUTANEOUS
  Administered 2012-01-23 (×2): 8 [IU] via SUBCUTANEOUS
  Administered 2012-01-24: 3 [IU] via SUBCUTANEOUS
  Administered 2012-01-24: 11 [IU] via SUBCUTANEOUS
  Administered 2012-01-25 (×2): 5 [IU] via SUBCUTANEOUS
  Administered 2012-01-25: 2 [IU] via SUBCUTANEOUS
  Administered 2012-01-26 (×2): 3 [IU] via SUBCUTANEOUS
  Administered 2012-01-27: 11 [IU] via SUBCUTANEOUS
  Administered 2012-01-27: 5 [IU] via SUBCUTANEOUS
  Administered 2012-01-28: 11 [IU] via SUBCUTANEOUS
  Administered 2012-01-28 (×2): 15 [IU] via SUBCUTANEOUS
  Administered 2012-01-29 (×2): 8 [IU] via SUBCUTANEOUS
  Administered 2012-01-29: 11 [IU] via SUBCUTANEOUS

## 2012-01-22 MED ORDER — HYOSCYAMINE SULFATE 0.125 MG SL SUBL
0.1250 mg | SUBLINGUAL_TABLET | SUBLINGUAL | Status: DC | PRN
  Administered 2012-01-26: 0.25 mg via ORAL
  Filled 2012-01-22 (×2): qty 2

## 2012-01-22 MED ORDER — INSULIN GLARGINE 100 UNIT/ML ~~LOC~~ SOLN
30.0000 [IU] | Freq: Every day | SUBCUTANEOUS | Status: DC
  Administered 2012-01-22: 30 [IU] via SUBCUTANEOUS

## 2012-01-22 MED ORDER — INSULIN ASPART 100 UNIT/ML ~~LOC~~ SOLN
15.0000 [IU] | Freq: Once | SUBCUTANEOUS | Status: AC
  Administered 2012-01-22: 15 [IU] via SUBCUTANEOUS

## 2012-01-22 MED ORDER — SODIUM POLYSTYRENE SULFONATE 15 GM/60ML PO SUSP
60.0000 g | Freq: Once | ORAL | Status: AC
  Administered 2012-01-22: 60 g via ORAL
  Filled 2012-01-22: qty 60
  Filled 2012-01-22: qty 180

## 2012-01-22 MED ORDER — INSULIN ASPART 100 UNIT/ML ~~LOC~~ SOLN
5.0000 [IU] | Freq: Once | SUBCUTANEOUS | Status: AC
  Administered 2012-01-22: 5 [IU] via SUBCUTANEOUS

## 2012-01-22 NOTE — Progress Notes (Signed)
NAME:  [name], [name]           ACCOUNT NO.:  [id number]  MEDICAL RECORD NO.:  [id number]  LOCATION:  [id number]                          FACILITY:  [facility]  PHYSICIAN:  Raeya Merritts D. Legrand Rams, MD   DATE OF BIRTH:  01-01-1961  DATE OF PROCEDURE:  01/22/2012 DATE OF DISCHARGE:                                PROGRESS NOTE   SUBJECTIVE:  The patient complains of abdominal pain.  No nausea, vomiting.  OBJECTIVE:  GENERAL:  The patient is alert, awake, sick looking. VITALS:  Blood pressure 102/68, pulse 80, respiratory rate 20, temperature 97 degrees Fahrenheit. CHEST:  Decreased air entry, few rhonchi. CARDIOVASCULAR SYSTEM:  First and second heart sounds heard.  No murmur. No gallop.  ABDOMEN:  Soft and lax.  Bowel sound is positive.  No mass or organomegaly. EXTREMITIES:  No leg edema.  LABORATORY DATA:  WBC 10.9, hemoglobin 10.0, hematocrit 30.2, and platelets 193.  BMP, sodium 129, potassium 6.1, chloride 96, carbon dioxide 18, glucose 321, BUN 49, creatinine 2.35 and calcium 9.5.  ASSESSMENT: 1. Hypoglycemia clinically improved. 2. Pneumonia. 3. Urinary tract infection. 4. Acute on chronic renal failure. 5. Hypercalcemia. 6. Mental retardation.  PLAN:  We will continue the patient on IV antibiotics.  We will continue IV hydration.  Endocrine consult is pending.  We will treat his hyperkalemia.     Tjay Velazquez D. Legrand Rams, MD     TDF/MEDQ  D:  01/22/2012  T:  01/22/2012  Job:  [id number]

## 2012-01-22 NOTE — Consult Note (Signed)
NAMECAYD, [name]           ACCOUNT NO.:  [id number]  MEDICAL RECORD NO.:  [id number]  LOCATION:  [id number]                          FACILITY:  [facility]  PHYSICIAN:  Loni Beckwith, MD DATE OF BIRTH:  [birth year]  DATE OF CONSULTATION:  01/22/2012 DATE OF DISCHARGE:                                CONSULTATION   REASON FOR CONSULT:  Uncontrolled type 1 diabetes.  HISTORY OF PRESENT ILLNESS:  This is a 51 year old gentleman with multiple medical problems including type 1 diabetes, mental retardation, assisted living facility resident, recurrent urinary tract infection, presented to the emergency room with a complaint of hypoglycemia.  On further workup, he was also found to have bilateral pneumonia on chest x- ray.  He is admitted for treatment.  His hypoglycemia required dextrose support and significant readjustment in his insulin dose.  Currently, his blood glucose is running in the range of 250-350 mg per dL.  He is known to have brittle difficult to control diabetes with most recent A1c of 8% in February 2013.  Endocrinology consult was requested due to fluctuation in his blood glucose profile.  PAST MEDICAL HISTORY: 1. Depression. 2. Anemia. 3. Chronic indwelling catheter. 4. Acute renal failure. 5. Benign prostatic hypertrophy. 6. Recurrent UTI. 7. Mental retardation. 8. Type 1 diabetes. 9. History of recurrent pneumonia.  HOME MEDICATIONS: 1. NovoLog 10-15 units with meals 3 times a day. 2. Lantus 30 units subcutaneously at bedtime. 3. Lisinopril 10 mg daily. 4. MiraLAX 17 g daily. 5. Ranitidine 150 mg daily. 6. Zoloft 150 mg daily. 7. Septra 1 tablet double strength daily. 8. Flomax 0.4 mg daily. 9. Trazodone 50 mg daily. 10.Geodon 40 mg 2 tablets daily.  SOCIAL HISTORY:  The patient is a resident of assisted living.  No history of current smoking, alcohol, or drug use.  FAMILY HISTORY:  Unavailable at this time.  REVIEW OF SYSTEMS:  Difficult to obtain.   Patient is aphasic.  Per nursing staff, he is cooperating with meals and oral intake.  Although still eating around 50% of his meals.  PHYSICAL EXAM:  GENERAL:  He is not in acute distress. VITAL SIGNS:  Blood pressure is 141/73, pulse rate 96, temperature 98.8. HEENT:  No icterus.  No pallor. NECK:  No thyromegaly.  No JVD. CHEST:  Significant for bibasilar rhonchi.  CARDIOVASCULAR:  Distant heart sounds, however, no murmurs, no gallops. ABDOMEN:  Soft and nontender. EXTREMITIES:  Global decrease of skeletal muscle mass, otherwise no edema. CNS:  Nonfocal.  LAB DATA:  His recent labs showed sodium 129, potassium 6.1, chloride 96, bicarb 18, BUN 49, creatinine 2.35, calcium 9.6.  A1c 8%.  TSH was near normal at 0.29 on April  2012.  ASSESSMENT: 1. Type 1 diabetes, difficult to control glycemia. 2. Bilateral pneumonia. 3. Recurrent urinary tract infection. 4. Mental retardation. 5. Indwelling catheter dependent.  PLAN:  Now that hypoglycemia is resolved, the patient is running in the hyperglycemic range.  We will resume his basal bolus insulin for better control.  I will initiate Lantus 30 units nightly and NovoLog 8 units t.i.d. a.c. plus moderate dose sliding scale.  Notice, he is on Kayexalate with bowel movement for hyperkalemia.  I noticed that he is  on antibiotics for urinary tract infection.  Continue to hydrate and assist with meals on one-to-one.  I will re-evaluate the patient in 24 hours, to titrate insulin dose.  Dear Dr. Legrand Rams, thank you for the opportunity to participate in the care of this pleasant patient.  I plan to see the patient as an outpatient as well.          ______________________________ Loni Beckwith, MD     GN/MEDQ  D:  01/22/2012  T:  01/22/2012  Job:  [id number]

## 2012-01-22 NOTE — Progress Notes (Signed)
Blood sugars continue to be high

## 2012-01-22 NOTE — Progress Notes (Signed)
UR Chart Review Completed  

## 2012-01-22 NOTE — Clinical Social Work Note (Signed)
Clinical Social Work Department BRIEF PSYCHOSOCIAL ASSESSMENT 01/22/2012  Patient:  [name], [name]     Account Number:  [id number]     Admit date:  01/21/2012  Clinical Social Worker:  Beverly Sessions  Date/Time:  01/22/2012 11:00 AM  Referred by:  Care Management  Date Referred:  01/22/2012 Referred for  ALF Placement   Other Referral:   Interview type:  Patient Other interview type:   [place]    PSYCHOSOCIAL DATA Living Status:  FACILITY Admitted from facility:  [place] Level of care:  Assisted Living Primary support name:  Facility Primary support relationship to patient:  NONE Degree of support available:   adequate    CURRENT CONCERNS Current Concerns  Post-Acute Placement  Other - See comment   Other Concerns:   CSW requested that [facility] initiate guardianship for pt.  Reggie will follow up.  Also, CSW spoke with [facility] who reports okay for pt to return to ALF without initiating [facility] due to MR as they will send team to [facility] to complete for long term residents.  Facility and [facility] aware.    SOCIAL WORK ASSESSMENT / PLAN CSW met with pt at bedside.  Pt is mute but nodded head in agreement that he was from [facility] and plans to return. Pt has been resident at facility long term.  Per Reggie at [facility] okay for pt to return at d/c and facility uses [facility] if home health is needed.   Assessment/plan status:  Psychosocial Support/Ongoing Assessment of Needs Other assessment/ plan:   CSW to continue to follow.  No other needs reported.   Information/referral to community resources:   DSS for guardianship    PATIENT'S/FAMILY'S RESPONSE TO PLAN OF CARE: Pt positively responded when discussing return to [place], [facility] [place]

## 2012-01-22 NOTE — Care Management Note (Unsigned)
    Page 1 of 1   01/30/2012     1:36:38 PM   CARE MANAGEMENT NOTE 01/30/2012  Patient:  [name], [name]   Account Number:  [id number]  Date Initiated:  01/22/2012  Documentation initiated by:  Theophilus Kinds  Subjective/Objective Assessment:   Pt admitted from [facility] with hypoglycemia. Pt now has hyperglycemia. Pt will return to [facility] at discharge.     Action/Plan:   No CM needs noted at this time. CSW is aware of pts admission and will arrange transfer to facility when pt is medically stable for discharge.   Anticipated DC Date:  01/28/2012   Anticipated DC Plan:  ASSISTED LIVING / REST HOME  In-house referral  Clinical Social Worker      DC Planning Services  CM consult      Choice offered to / List presented to:             Status of service:  In process, will continue to follow Medicare Important Message given?   (If response is "NO", the following Medicare IM given date fields will be blank) Date Medicare IM given:   Date Additional Medicare IM given:    Discharge Disposition:    Per UR Regulation:    If discussed at Long Length of Stay Meetings, dates discussed:    Comments:  01/30/12 [place], RN BSN CM Pt with episode of respiratory failure, intubated and moved to ICU. Will continue to follow pt for CM needs.  01/22/12 [place], RN BSN CM

## 2012-01-22 NOTE — Consult Note (Signed)
  [id number] 

## 2012-01-22 NOTE — H&P (Signed)
NAME:  [name], [name]           ACCOUNT NO.:  [id number]  MEDICAL RECORD NO.:  [id number]  LOCATION:  [id number]                          FACILITY:  [facility]  PHYSICIAN:  Jazen Spraggins D. Legrand Rams, MD   DATE OF BIRTH:  May 30, 1961  DATE OF ADMISSION:  01/21/2012 DATE OF DISCHARGE:  [facility]                             HISTORY & PHYSICAL   CHIEF COMPLAINT:  Drop in blood sugar.  HISTORY OF PRESENT ILLNESS:  This is a 51 year old male patient who is currently a resident of assisted living, was brought to emergency room due to severe hypoglycemia.  His blood sugar was around 40 when he was evaluated in the assisted living.  He was previously admitted several times with similar episodes and urinary tract infection.  He was evaluated in the emergency room and was given D50.  Further evaluation also showed that he has bilateral infiltrates on his chest x-ray suggestive of pneumonia.  His urinalysis also was abnormal.  The patient was started on IV antibiotics and was admitted for further treatment.  REVIEW OF SYSTEMS:  The patient is confused and disoriented, unable to give any history.  PAST MEDICAL HISTORY: 1. History of pneumonia. 2. Diabetes mellitus. 3. Mental retardation. 4. Recurrent urinary tract infection. 5. Benign prostatic hypertrophy. 6. History of acute renal failure. 7. History of systemic inflammatory response. 8. Hyperkalemia. 9. Anemia. 10.Chronic indwelling catheter.  HOME MEDICATIONS: 1. Acetaminophen 325 mg 2 tablets q.6 hours p.r.n. 2. Levsin 0.125 mg sublingual p.r.n. 3. NovoLog insulin according to sliding scale. 4. Lantus insulin 30 units subcu at bedtime. 5. Lisinopril 10 mg daily. 6. MiraLAX 17 g daily. 7. Ranitidine 150 mg daily. 8. Zoloft 150 mg daily. 9. Septra DS 1 tablet daily. 10.Flomax 0.4 mg daily. 11.Trazodone 50 mg at bedtime. 12.Geodon 40 mg 2 tablets daily with meals.  SOCIAL HISTORY:  The patient is resident of assisted living.  No history of alcohol,  tobacco, or substance abuse.  FAMILY HISTORY:  Not available at this time.  PHYSICAL EXAMINATION:  GENERAL:  The patient is alert, awake, but acutely sick looking. VITAL SIGNS:  Blood pressure 102/68, pulse 140, respiratory rate 22, temperature 99.3 degrees Fahrenheit. HEENT:  Pupils are equal, reactive. NECK:  Supple. CHEST:  Decreased air entry, bilateral rhonchi. CARDIOVASCULAR SYSTEM:  First and second heart sounds heard.  No murmur. No gallop. ABDOMEN:  Soft and lax.  Bowel sound is positive.  No mass or organomegaly.  EXTREMITIES:  No leg edema.  LABS:  CBC:  WBC 6.1, hemoglobin 9.7, hematocrit 29.7, and platelets 188.  BMP:  Sodium 140, potassium 4.4, chloride 106, carbon dioxide 23, glucose 87, BUN 36, creatinine 1.72, and calcium 10.2.  ASSESSMENT: 1. Recurrent hypoglycemia. 2. Pneumonia. 3. Urinary tract infection. 4. Diabetes mellitus type 2. 5. Mental retardation. 6. History of recurrent urinary tract infection.  PLAN:  We will continue the patient on combination IV antibiotics.  We will do endocrine consult.  We will continue on sliding scale.  We will continue to hydrate the patient gradually.     Elie Gragert D. Legrand Rams, MD     TDF/MEDQ  D:  01/22/2012  T:  01/22/2012  Job:  [id number]

## 2012-01-22 NOTE — Progress Notes (Signed)
Late Entry:  Patient has a foley with red tinge urine.

## 2012-01-22 NOTE — Progress Notes (Signed)
Reported to physician on call of patient's blood sugar of 433.  New orders given:  Change IV fluid to Normal Saline at 100 cc/hr and give 5 units of regular novolog (one time dose).

## 2012-01-22 NOTE — Progress Notes (Signed)
Informed physician on call about patient's uncontrolled pain.  Reported that patient was given tylenol at 21:15 for patient points to his stomach and loudly moans while guarding his stomach, blood sugar was 301 with no HS coverage.  New orders were given for morphine 2 mg iv prn for pain.

## 2012-01-23 ENCOUNTER — Inpatient Hospital Stay (HOSPITAL_COMMUNITY): Payer: Medicaid Other

## 2012-01-23 LAB — CBC
HCT: 26.8 % — ABNORMAL LOW (ref 39.0–52.0)
MCV: 82 fL (ref 78.0–100.0)
RDW: 13.7 % (ref 11.5–15.5)
WBC: 15.2 10*3/uL — ABNORMAL HIGH (ref 4.0–10.5)

## 2012-01-23 LAB — GLUCOSE, CAPILLARY
Glucose-Capillary: 238 mg/dL — ABNORMAL HIGH (ref 70–99)
Glucose-Capillary: 95 mg/dL (ref 70–99)

## 2012-01-23 LAB — BASIC METABOLIC PANEL
BUN: 54 mg/dL — ABNORMAL HIGH (ref 6–23)
CO2: 20 mEq/L (ref 19–32)
Chloride: 97 mEq/L (ref 96–112)
Creatinine, Ser: 2.65 mg/dL — ABNORMAL HIGH (ref 0.50–1.35)
GFR calc Af Amer: 31 mL/min — ABNORMAL LOW (ref >90)
Glucose, Bld: 260 mg/dL — ABNORMAL HIGH (ref 70–99)

## 2012-01-23 MED ORDER — INSULIN ASPART 100 UNIT/ML ~~LOC~~ SOLN
10.0000 [IU] | Freq: Three times a day (TID) | SUBCUTANEOUS | Status: DC
  Administered 2012-01-23 – 2012-01-24 (×3): 10 [IU] via SUBCUTANEOUS

## 2012-01-23 MED ORDER — INSULIN GLARGINE 100 UNIT/ML ~~LOC~~ SOLN
35.0000 [IU] | Freq: Every day | SUBCUTANEOUS | Status: DC
  Administered 2012-01-23 – 2012-01-29 (×6): 35 [IU] via SUBCUTANEOUS

## 2012-01-23 MED ORDER — SODIUM CHLORIDE 0.9 % IJ SOLN
INTRAMUSCULAR | Status: AC
  Administered 2012-01-23: 10 mL
  Filled 2012-01-23: qty 3

## 2012-01-23 NOTE — Progress Notes (Signed)
NAME:  XION, RUMFIELD           ACCOUNT NO.:  [id number]  MEDICAL RECORD NO.:  [id number]  LOCATION:  [id number]                          FACILITY:  [facility]  PHYSICIAN:  Bernedette Auston D. Legrand Rams, MD   DATE OF BIRTH:  Dec 15, 1960  DATE OF PROCEDURE:  01/23/2012 DATE OF DISCHARGE:                                PROGRESS NOTE   SUBJECTIVE:  The patient feels slightly better.  He is still complaining of cough and congestion.  His fever is subsiding.  OBJECTIVE:  GENERAL:  The patient is alert, awake, and sick looking. VITALS:  Blood pressure 156/68, pulse 100, respiratory rate 20, temperature 100.3 degrees Fahrenheit. CHEST:  Decreased air entry, few rhonchi. CARDIOVASCULAR SYSTEM:  First and second heart sounds heard.  No murmur. No gallop.  ABDOMEN:  Soft, and lax.  Bowel sound is positive.  No mass or organomegaly. EXTREMITIES:  No leg edema.  LABORATORY DATA:  Sodium 132, potassium 4.3, chloride 97, carbon dioxide 20, glucose 260, BUN 54, creatinine 2.65, calcium 8.7.  ASSESSMENT: 1. Pneumonia. 2. Urinary tract infection. 3. Acute on chronic renal failure. 4. Encephalopathy. 5. Diabetes mellitus.  PLAN:  We will continue the patient on IV antibiotics.  Continue IV fluids.  We will do Nephrology consult and continue his regular treatment.     Kinlee Garrison D. Legrand Rams, MD     TDF/MEDQ  D:  01/23/2012  T:  01/23/2012  Job:  [id number]

## 2012-01-23 NOTE — Progress Notes (Signed)
Appetite poor, afebrile Iv antibiotics continued

## 2012-01-23 NOTE — Progress Notes (Signed)
[name], [name]           ACCOUNT NO.:  [id number]  MEDICAL RECORD NO.:  [id number]  LOCATION:  [id number]                          FACILITY:  [facility]  PHYSICIAN:  Loni Beckwith, MD DATE OF BIRTH:  Jan 23, 1961  DATE OF PROCEDURE:  01/23/2012 DATE OF DISCHARGE:                                PROGRESS NOTE   REASON FOR FOLLOWUP:  Uncontrolled type 1 diabetes.  SUBJECTIVE:  The patient did not have hypoglycemia overnight; however, blood glucose still runs higher than target, although much better than yesterday.  He is eating approximately 50% of his meals.  Overnight developments have been revised.  OBJECTIVE:  GENERAL:  The patient is aphasic, not in acute distress. VITAL SIGNS:  Blood pressure 172/75, pulse rate 105, temperature 98.6. HEENT:  Well hydrated. NECK:  Negative for JVD. CHEST:  Clear to auscultation bilaterally. CARDIOVASCULAR:  Normal S1 and S2.  No murmur, no gallop. ABDOMEN:  No ascites.  Soft and nontender. EXTREMITIES:  No edema.  His blood glucose profile ranged from 150-260 overnight.  ASSESSMENT: 1. Type 1 diabetes with recent A1c of 8%. 2. Bilateral pneumonia. 3. Recurrent urinary tract infection. 4. Mental retardation. 5. Indwelling catheter dependent.  PLAN:  I will continue to titrate insulin.  He will need Lantus 35 units at bedtime and increase NovoLog to 10 units t.i.d. a.c. plus sliding scale.  He will continue to require assistance with meals on one-to-one basis.  Do not give NovoLog at bedtime.  I will continue to see him to titrate insulin.          ______________________________ Loni Beckwith, MD     GN/MEDQ  D:  01/23/2012  T:  01/23/2012  Job:  [id number]

## 2012-01-23 NOTE — Progress Notes (Signed)
Patient ID: [name], male   DOB: 1961/08/28, 51 y.o.   MRN: [id number]

## 2012-01-24 ENCOUNTER — Inpatient Hospital Stay (HOSPITAL_COMMUNITY): Payer: Medicaid Other

## 2012-01-24 LAB — CBC
HCT: 26.8 % — ABNORMAL LOW (ref 39.0–52.0)
Hemoglobin: 8.9 g/dL — ABNORMAL LOW (ref 13.0–17.0)
MCH: 26.7 pg (ref 26.0–34.0)
MCHC: 33.2 g/dL (ref 30.0–36.0)
MCV: 80.5 fL (ref 78.0–100.0)
RBC: 3.33 MIL/uL — ABNORMAL LOW (ref 4.22–5.81)

## 2012-01-24 LAB — GLUCOSE, CAPILLARY
Glucose-Capillary: 275 mg/dL — ABNORMAL HIGH (ref 70–99)
Glucose-Capillary: 59 mg/dL — ABNORMAL LOW (ref 70–99)

## 2012-01-24 LAB — BASIC METABOLIC PANEL
BUN: 42 mg/dL — ABNORMAL HIGH (ref 6–23)
CO2: 18 mEq/L — ABNORMAL LOW (ref 19–32)
Chloride: 102 mEq/L (ref 96–112)
Glucose, Bld: 295 mg/dL — ABNORMAL HIGH (ref 70–99)
Potassium: 4.2 mEq/L (ref 3.5–5.1)
Sodium: 133 mEq/L — ABNORMAL LOW (ref 135–145)

## 2012-01-24 LAB — MRSA CULTURE

## 2012-01-24 MED ORDER — AZITHROMYCIN 250 MG PO TABS
500.0000 mg | ORAL_TABLET | ORAL | Status: DC
  Administered 2012-01-24 – 2012-01-28 (×5): 500 mg via ORAL
  Filled 2012-01-24 (×5): qty 2

## 2012-01-24 MED ORDER — ONDANSETRON HCL 4 MG/2ML IJ SOLN
4.0000 mg | Freq: Four times a day (QID) | INTRAMUSCULAR | Status: DC | PRN
  Administered 2012-01-24 – 2012-01-25 (×2): 4 mg via INTRAVENOUS
  Filled 2012-01-24 (×2): qty 2

## 2012-01-24 MED ORDER — FLUCONAZOLE 100MG IVPB
100.0000 mg | INTRAVENOUS | Status: DC
  Administered 2012-01-24: 100 mg via INTRAVENOUS
  Filled 2012-01-24 (×2): qty 50

## 2012-01-24 MED ORDER — IBUPROFEN 800 MG PO TABS
800.0000 mg | ORAL_TABLET | Freq: Once | ORAL | Status: AC
  Administered 2012-01-24: 800 mg via ORAL
  Filled 2012-01-24: qty 1

## 2012-01-24 MED ORDER — INSULIN ASPART 100 UNIT/ML ~~LOC~~ SOLN
15.0000 [IU] | Freq: Three times a day (TID) | SUBCUTANEOUS | Status: DC
  Administered 2012-01-26 – 2012-01-28 (×5): 15 [IU] via SUBCUTANEOUS

## 2012-01-24 MED ORDER — FLUCONAZOLE 100 MG PO TABS
100.0000 mg | ORAL_TABLET | Freq: Every day | ORAL | Status: DC
  Administered 2012-01-25 – 2012-02-07 (×13): 100 mg via ORAL
  Filled 2012-01-24 (×13): qty 1

## 2012-01-24 NOTE — Progress Notes (Signed)
NAME:  [name], [name]           ACCOUNT NO.:  [id number]  MEDICAL RECORD NO.:  [id number]  LOCATION:  [id number]                          FACILITY:  [facility]  PHYSICIAN:  Danel Requena D. Legrand Rams, MD   DATE OF BIRTH:  1961-04-20  DATE OF PROCEDURE:  01/24/2012 DATE OF DISCHARGE:                                PROGRESS NOTE   SUBJECTIVE:  The patient had episode of vomiting and abdominal discomfort.  No fever or chills.  OBJECTIVE:  GENERAL:  The patient is alert, awake, and sick looking. VITAL SIGNS:  Blood pressure 125/63, pulse 92, respiratory rate 19, temperature 98 degrees Fahrenheit. CHEST:  Decreased air entry, few rhonchi. CARDIOVASCULAR SYSTEM:  First and second heart sounds heard.  No murmur. No gallop. ABDOMEN:  Soft and lax.  Bowel sound is positive.  No mass or organomegaly. EXTREMITIES:  No leg edema.  LABORATORY DATA:  CBC:  WBC 17.3, hemoglobin 8.9, hematocrit 26.8, and platelets 230.  BMP:  Sodium 133, potassium 4.2, chloride 102, bicarb 18, blood glucose 295, BUN 42, creatinine 1.92, calcium 8.7.  Urine culture is growing enterococcus species and yeast.  ASSESSMENT: 1. Bilateral pneumonia. 2. Urinary tract infection. 3. Acute-on-chronic renal failure. 4. Diabetes mellitus. 5. History of mental retardation. 6. History of chronic indwelling catheter.  PLAN:  We will continue the patient on IV antibiotics.  We will add Diflucan on his treatment.  We will continue IV fluids.  Nephrology consult is appreciated.     Crewe Heathman D. Legrand Rams, MD     TDF/MEDQ  D:  01/24/2012  T:  01/24/2012  Job:  [id number]

## 2012-01-24 NOTE — Progress Notes (Signed)
Patient running fever of 103 administered 2 tylenol and fever only came down to 102.9. Page Dr. Legrand Rams and motrin and blood cultures were ordered.

## 2012-01-24 NOTE — Progress Notes (Signed)
[name], [name]           ACCOUNT NO.:  [id number]  MEDICAL RECORD NO.:  [id number]  LOCATION:  [id number]                          FACILITY:  [facility]  PHYSICIAN:  Loni Beckwith, MD DATE OF BIRTH:  [birth year]  DATE OF PROCEDURE:  01/24/2012 DATE OF DISCHARGE:                                PROGRESS NOTE   REASON FOR FOLLOWUP:  Uncontrolled type 1 diabetes.  SUBJECTIVE:  The patient did not have any hypoglycemia; however, blood glucoses are fluctuating mostly higher.  The patient eats at least 50% of his meals.  OBJECTIVE:  GENERAL:  The patient is aphasic, not in acute distress. VITAL SIGNS:  Blood pressure of 172/79, pulse rate 120, temperature 103. HEENT:  Mucous membranes are hydrated. NECK:  Negative for JVD. CHEST:  Clear to auscultation bilaterally. CARDIOVASCULAR:  Normal S1, S2.  No murmur.  No gallop. ABDOMEN:  Soft and nontender. EXTREMITIES:  No edema.  His glycemia ranged from 100-290.  His Lantus is currently at 35 units nightly and his NovoLog is at 10 units t.i.d. a.c. plus sliding scale.  ASSESSMENT: 1. Type 1 diabetes, chronically uncontrolled with recent A1c of 8%. 2. Bilateral pneumonia. 3. Recurrent urinary tract infection. 4. Mental retardation. 5. Indwelling catheter dependent. 6. Acute-on-chronic renal failure.  PLAN:  I will continue Lantus at 35 units at bedtime, however, increase the NovoLog to 15 units t.i.d. a.c. plus sliding scale.  The patient will continue to require basal bolus insulin as type 1 diabetic difficult to control.  He will need one-on-one assistance with meals. Do not give NovoLog at bedtime.  I will continue to see the patient as an outpatient.          ______________________________ Loni Beckwith, MD     GN/MEDQ  D:  01/24/2012  T:  01/24/2012  Job:  [id number]

## 2012-01-24 NOTE — Progress Notes (Signed)
Patient ID: [name], male   DOB: 02/13/1961, 51 y.o.   MRN: [id number]

## 2012-01-24 NOTE — Consult Note (Signed)
Reason for Consult: Worsening of renal failure Referring Physician: Dr. Ky Barban is an 51 y.o. male.  HPI: He is a patient with mental retardation, BPH, type 2 diabetes presently was brought because of  hypoglycemia and the also chest pain. Presently her patient is alert however seems to have difficulty in answering questions. Patient doesn't seem to be in any apparent distress.  Past Medical History  Diagnosis Date  . Renal disorder   . Elective mutism   . Autism   . Hyperosmolar (nonketotic) coma   . Hyperglycemia   . Diabetes mellitus   . Hyponatremia   . Mental retardation   . Urinary retention   . BPH (benign prostatic hyperplasia)   . Gastroparesis   . Depression   . Pneumonia   . UTI (urinary tract infection)   . Mental retardation     History reviewed. No pertinent past surgical history.  No family history on file.  Social History:  reports that he has never smoked. He does not have any smokeless tobacco history on file. He reports that he does not drink alcohol or use illicit drugs.  Allergies: No Known Allergies  Medications: I have reviewed the patient's current medications.  Results for orders placed during the hospital encounter of 01/21/12 (from the past 48 hour(s))  MRSA PCR SCREENING     Status: Abnormal   Collection Time   01/22/12  9:35 AM      Component Value Range Comment   MRSA by PCR INVALID RESULTS, SPECIMEN SENT FOR CULTURE (*) NEGATIVE    MRSA CULTURE     Status: Normal (Preliminary result)   Collection Time   01/22/12  9:35 AM      Component Value Range Comment   Specimen Description NOSE NASAL SWAB      Special Requests NONE      Culture Culture reincubated for better growth      Report Status PENDING     GLUCOSE, CAPILLARY     Status: Abnormal   Collection Time   01/22/12 11:11 AM      Component Value Range Comment   Glucose-Capillary 352 (*) 70 - 99 (mg/dL)    Comment 1 Notify RN     GLUCOSE, CAPILLARY     Status:  Abnormal   Collection Time   01/22/12  2:21 PM      Component Value Range Comment   Glucose-Capillary 392 (*) 70 - 99 (mg/dL)    Comment 1 Notify RN     GLUCOSE, CAPILLARY     Status: Abnormal   Collection Time   01/22/12  4:19 PM      Component Value Range Comment   Glucose-Capillary 314 (*) 70 - 99 (mg/dL)    Comment 1 Notify RN     GLUCOSE, CAPILLARY     Status: Abnormal   Collection Time   01/22/12  9:17 PM      Component Value Range Comment   Glucose-Capillary 130 (*) 70 - 99 (mg/dL)   CBC     Status: Abnormal   Collection Time   01/23/12  5:53 AM      Component Value Range Comment   WBC 15.2 (*) 4.0 - 10.5 (K/uL)    RBC 3.27 (*) 4.22 - 5.81 (MIL/uL)    Hemoglobin 8.8 (*) 13.0 - 17.0 (g/dL)    HCT 26.8 (*) 39.0 - 52.0 (%)    MCV 82.0  78.0 - 100.0 (fL)    MCH 26.9  26.0 -  34.0 (pg)    MCHC 32.8  30.0 - 36.0 (g/dL)    RDW 13.7  11.5 - 15.5 (%)    Platelets 208  150 - 400 (K/uL)   BASIC METABOLIC PANEL     Status: Abnormal   Collection Time   01/23/12  5:53 AM      Component Value Range Comment   Sodium 132 (*) 135 - 145 (mEq/L)    Potassium 4.3  3.5 - 5.1 (mEq/L) DELTA CHECK NOTED   Chloride 97  96 - 112 (mEq/L)    CO2 20  19 - 32 (mEq/L)    Glucose, Bld 260 (*) 70 - 99 (mg/dL)    BUN 54 (*) 6 - 23 (mg/dL)    Creatinine, Ser 2.65 (*) 0.50 - 1.35 (mg/dL)    Calcium 8.7  8.4 - 10.5 (mg/dL)    GFR calc non Af Amer 26 (*) >90 (mL/min)    GFR calc Af Amer 31 (*) >90 (mL/min)   GLUCOSE, CAPILLARY     Status: Abnormal   Collection Time   01/23/12  7:41 AM      Component Value Range Comment   Glucose-Capillary 265 (*) 70 - 99 (mg/dL)    Comment 1 Documented in Chart      Comment 2 Notify RN     GLUCOSE, CAPILLARY     Status: Normal   Collection Time   01/23/12 11:34 AM      Component Value Range Comment   Glucose-Capillary 93  70 - 99 (mg/dL)    Comment 1 Notify RN      Comment 2 Documented in Chart     GLUCOSE, CAPILLARY     Status: Abnormal   Collection Time    01/23/12  3:12 PM      Component Value Range Comment   Glucose-Capillary 238 (*) 70 - 99 (mg/dL)    Comment 1 Notify RN      Comment 2 Documented in Chart     GLUCOSE, CAPILLARY     Status: Abnormal   Collection Time   01/23/12  4:33 PM      Component Value Range Comment   Glucose-Capillary 270 (*) 70 - 99 (mg/dL)    Comment 1 Documented in Chart      Comment 2 Notify RN     GLUCOSE, CAPILLARY     Status: Normal   Collection Time   01/23/12  8:43 PM      Component Value Range Comment   Glucose-Capillary 95  70 - 99 (mg/dL)    Comment 1 Notify RN     BASIC METABOLIC PANEL     Status: Abnormal   Collection Time   01/24/12  5:06 AM      Component Value Range Comment   Sodium 133 (*) 135 - 145 (mEq/L)    Potassium 4.2  3.5 - 5.1 (mEq/L)    Chloride 102  96 - 112 (mEq/L)    CO2 18 (*) 19 - 32 (mEq/L)    Glucose, Bld 295 (*) 70 - 99 (mg/dL)    BUN 42 (*) 6 - 23 (mg/dL)    Creatinine, Ser 1.92 (*) 0.50 - 1.35 (mg/dL)    Calcium 8.7  8.4 - 10.5 (mg/dL)    GFR calc non Af Amer 39 (*) >90 (mL/min)    GFR calc Af Amer 45 (*) >90 (mL/min)   CBC     Status: Abnormal   Collection Time   01/24/12  5:06 AM  Component Value Range Comment   WBC 17.2 (*) 4.0 - 10.5 (K/uL)    RBC 3.33 (*) 4.22 - 5.81 (MIL/uL)    Hemoglobin 8.9 (*) 13.0 - 17.0 (g/dL)    HCT 26.8 (*) 39.0 - 52.0 (%)    MCV 80.5  78.0 - 100.0 (fL)    MCH 26.7  26.0 - 34.0 (pg)    MCHC 33.2  30.0 - 36.0 (g/dL)    RDW 14.3  11.5 - 15.5 (%)    Platelets 230  150 - 400 (K/uL)   GLUCOSE, CAPILLARY     Status: Abnormal   Collection Time   01/24/12  7:34 AM      Component Value Range Comment   Glucose-Capillary 318 (*) 70 - 99 (mg/dL)    Comment 1 Notify RN       US Renal  01/23/2012  *RADIOLOGY REPORT*  Clinical Data: Renal failure  RENAL / URINARY TRACT ULTRASOUND  Technique:  Complete ultrasound exam of the kidneys and urinary bladder was performed.  Comparison: 09/19/2011  Findings:  The right kidney measures 11.2 cm in  long axis.  The left kidney measures 10.5 cm.  Tiny left renal cyst identified on CT from 09/19/2011 is not readily On today's ultrasound exam.  A Foley catheter decompresses the urinary bladder.  Impression:  No evidence for hydronephrosis.  Original Report Authenticated By: ERIC A. MANSELL, M.D.   Dg Chest Port 1 View  01/24/2012  *RADIOLOGY REPORT*  Clinical Data: Pneumonia.  PORTABLE CHEST - 1 VIEW  Comparison: 01/21/2012  Findings: There is been marked progression of infiltrates in the right lung since the prior exam.  There is a new small right effusion.  The extensive perihilar infiltrate in the left is essentially unchanged.  Heart size remains within normal limits.  IMPRESSION: Bilateral pulmonary infiltrates with significant progression on the right.  Original Report Authenticated By: Larey Seat, M.D.    Review of Systems  Unable to perform ROS  Blood pressure 188/82, pulse 100, temperature 98.5 F (36.9 C), temperature source Oral, resp. rate 21, height 5\' 10"  (1.778 m), weight 68 kg (149 lb 14.6 oz), SpO2 92.00%. Physical Exam  Constitutional: No distress.  Eyes: No scleral icterus.  Neck: No JVD present.  Cardiovascular: Normal rate and regular rhythm.   Murmur heard. Respiratory: He has no wheezes. He has no rales.  GI: He exhibits no distension. There is no tenderness.  Musculoskeletal: He exhibits no edema.    Assessment/Plan: Problem #1 acute kidney injury superimposed on chronic patient creatinine has been 1.4 about 2 years ago and 1.5 last year and presently his BUN and creatinine has increased. The present increasing BUN and creatinine could be secondary to prerenal syndrome presently his BUN and creatinine has improved and patient is none oliguric. The initial injury could be secondary to focal segmental nephrosclerosis related to his BPH and obstructive uropathy plus secondary to chronic and recurrent urinary tract infection. Problem #2 history of BPH patient with  Foley catheter Problem #3 history of diabetes Problem #4 history of mental retardation Problem #7 history of her urosepsis presently is on antibiotics and patient is afebrile with leukocytosis. Problem #6 history of anemia most likely iron deficiency his hemoglobin is 8.9 hematocrit 26.9. Problem #7 history of hyperkalemia  potassium is 4.2 has improved. Plan: I agree with hydration We'll check his basic metabolic panel, CBC and iron studies in the morning. I will follow patient.   Kimanh Templeman S 01/24/2012, 8:01 AM

## 2012-01-24 NOTE — Progress Notes (Signed)
PHARMACIST - PHYSICIAN COMMUNICATION DR:   Legrand Rams CONCERNING: Antibiotic IV to Oral Route Change Policy  RECOMMENDATION: This patient is receiving Zithromax & Diflucan by the intravenous route.  Based on criteria approved by the Pharmacy and Therapeutics Committee, the antibiotic(s) is/are being converted to the equivalent oral dose form(s).   DESCRIPTION: These criteria include:  Patient being treated for a respiratory tract infection, urinary tract infection, or cellulitis  The patient is not neutropenic and does not exhibit a GI malabsorption state  The patient is eating (either orally or via tube) and/or has been taking other orally administered medications for a least 24 hours  The patient is improving clinically and has a Tmax < 100.5  If you have questions about this conversion, please contact the Pharmacy Department  [x]   [phone] )  [facility] []   [phone] )  [facility]  []   [phone] )  [facility] []   [phone] )  [place], [place] 01/24/2012@12 :23 PM

## 2012-01-25 LAB — IRON AND TIBC

## 2012-01-25 LAB — BASIC METABOLIC PANEL
Calcium: 8.8 mg/dL (ref 8.4–10.5)
Creatinine, Ser: 1.42 mg/dL — ABNORMAL HIGH (ref 0.50–1.35)
GFR calc non Af Amer: 56 mL/min — ABNORMAL LOW (ref >90)
Sodium: 134 mEq/L — ABNORMAL LOW (ref 135–145)

## 2012-01-25 LAB — FERRITIN: Ferritin: 475 ng/mL — ABNORMAL HIGH (ref 22–322)

## 2012-01-25 LAB — GLUCOSE, CAPILLARY
Glucose-Capillary: 166 mg/dL — ABNORMAL HIGH (ref 70–99)
Glucose-Capillary: 228 mg/dL — ABNORMAL HIGH (ref 70–99)
Glucose-Capillary: 147 mg/dL — ABNORMAL HIGH (ref 70–99)

## 2012-01-25 LAB — CBC
Platelets: 221 10*3/uL (ref 150–400)
RBC: 3.24 MIL/uL — ABNORMAL LOW (ref 4.22–5.81)
RDW: 14.2 % (ref 11.5–15.5)
WBC: 14.3 10*3/uL — ABNORMAL HIGH (ref 4.0–10.5)

## 2012-01-25 LAB — URINE CULTURE: Culture  Setup Time: 201305210147

## 2012-01-25 LAB — PHOSPHORUS: Phosphorus: 2.6 mg/dL (ref 2.3–4.6)

## 2012-01-25 MED ORDER — SODIUM CHLORIDE 0.9 % IJ SOLN
INTRAMUSCULAR | Status: AC
  Filled 2012-01-25: qty 3

## 2012-01-25 MED ORDER — VANCOMYCIN HCL IN DEXTROSE 1-5 GM/200ML-% IV SOLN
1000.0000 mg | Freq: Two times a day (BID) | INTRAVENOUS | Status: DC
  Administered 2012-01-25 – 2012-01-29 (×10): 1000 mg via INTRAVENOUS
  Filled 2012-01-25 (×14): qty 200

## 2012-01-25 MED ORDER — FUROSEMIDE 10 MG/ML IJ SOLN
60.0000 mg | Freq: Once | INTRAMUSCULAR | Status: AC
  Administered 2012-01-25: 60 mg via INTRAVENOUS
  Filled 2012-01-25: qty 4

## 2012-01-25 NOTE — Clinical Social Work Note (Signed)
Pt continues to require acute care.  Plan remains for pt to return to [facility] when medically stable.  CSW will update facility on progress as needed.   [name], [place]

## 2012-01-25 NOTE — Consult Note (Signed)
ANTIBIOTIC CONSULT NOTE - INITIAL  Pharmacy Consult for Vancomycin Indication: pneumonia  No Known Allergies  Patient Measurements: Height: 5\' 10"  (177.8 cm) Weight: 149 lb 14.6 oz (68 kg) IBW/kg (Calculated) : 73   Vital Signs: Temp: 98.2 F (36.8 C) (05/24 0454) Temp src: Oral (05/24 0454) BP: 120/65 mmHg (05/24 0454) Pulse Rate: 93  (05/24 0454) Intake/Output from previous day: 05/23 0701 - 05/24 0700 In: 420 [P.O.:420] Out: 1550 [Urine:1550] Intake/Output from this shift:    Labs:  Basename 01/25/12 0658 01/24/12 0506 01/23/12 0553  WBC 14.3* 17.2* 15.2*  HGB 8.8* 8.9* 8.8*  PLT 221 230 208  LABCREA -- -- --  CREATININE 1.42* 1.92* 2.65*   Estimated Creatinine Clearance: 59.9 ml/min (by C-G formula based on Cr of 1.42). No results found for this basename: VANCOTROUGH:2,VANCOPEAK:2,VANCORANDOM:2,GENTTROUGH:2,GENTPEAK:2,GENTRANDOM:2,TOBRATROUGH:2,TOBRAPEAK:2,TOBRARND:2,AMIKACINPEAK:2,AMIKACINTROU:2,AMIKACIN:2, in the last 72 hours   Microbiology: Recent Results (from the past 720 hour(s))  URINE CULTURE     Status: Normal   Collection Time   01/11/12 10:49 PM      Component Value Range Status Comment   Specimen Description URINE, CATHETERIZED   Final    Special Requests NONE   Final    Culture  Setup Time [id number]   Final    Colony Count >=100,000 COLONIES/ML   Final    Culture     Final    Value: Multiple bacterial morphotypes present, none predominant. Suggest appropriate recollection if clinically indicated.   Report Status 01/13/2012 FINAL   Final   URINE CULTURE     Status: Normal (Preliminary result)   Collection Time   01/21/12  3:36 PM      Component Value Range Status Comment   Specimen Description URINE, CATHETERIZED   Final    Special Requests NONE   Final    Culture  Setup Time [id number]   Final    Colony Count >=100,000 COLONIES/ML   Final    Culture     Final    Value: ENTEROCOCCUS SPECIES     YEAST   Report Status PENDING   Incomplete    MRSA PCR SCREENING     Status: Abnormal   Collection Time   01/22/12  9:35 AM      Component Value Range Status Comment   MRSA by PCR INVALID RESULTS, SPECIMEN SENT FOR CULTURE (*) NEGATIVE  Final   MRSA CULTURE     Status: Normal   Collection Time   01/22/12  9:35 AM      Component Value Range Status Comment   Specimen Description NOSE NASAL SWAB   Final    Special Requests NONE   Final    Culture     Final    Value: NO STAPHYLOCOCCUS AUREUS ISOLATED     Note: NOMRSA   Report Status 01/24/2012 FINAL   Final    Medical History: Past Medical History  Diagnosis Date  . Renal disorder   . Elective mutism   . Autism   . Hyperosmolar (nonketotic) coma   . Hyperglycemia   . Diabetes mellitus   . Hyponatremia   . Mental retardation   . Urinary retention   . BPH (benign prostatic hyperplasia)   . Gastroparesis   . Depression   . Pneumonia   . UTI (urinary tract infection)   . Mental retardation    Medications:  Scheduled:    . azithromycin  500 mg Oral Q24H  . cefTRIAXone (ROCEPHIN)  IV  1 g Intravenous Q24H  . enoxaparin (LOVENOX) injection  40 mg Subcutaneous Q24H  . famotidine  20 mg Oral Daily  . fluconazole  100 mg Oral Daily  . furosemide  60 mg Intravenous Once  . ibuprofen  800 mg Oral Once  . insulin aspart  0-15 Units Subcutaneous TID WC  . insulin aspart  15 Units Subcutaneous TID WC  . insulin glargine  35 Units Subcutaneous QHS  . lisinopril  10 mg Oral Daily  . polyethylene glycol  17 g Oral q morning - 10a  . sertraline  150 mg Oral Daily  . Tamsulosin HCl  0.4 mg Oral Daily  . traZODone  50 mg Oral QHS  . vancomycin  1,000 mg Intravenous Q12H  . ziprasidone  40 mg Oral BID WC  . DISCONTD: azithromycin  500 mg Intravenous Q24H  . DISCONTD: fluconazole (DIFLUCAN) IV  100 mg Intravenous Q24H  . DISCONTD: insulin aspart  10 Units Subcutaneous TID WC   Assessment: Pt currently on Zithromax and Rocephin, adding Vanco Renal dz but SCr  improving Estimated Creatinine Clearance: 59.9 ml/min (by C-G formula based on Cr of 1.42).  Goal of Therapy:  Vancomycin trough level 15-20 mcg/ml Eradicate infection.  Plan: Vancomycin 1gm iv q12hrs Check trough tomorrow Labs per protocol  Hart Robinsons A 01/25/2012,9:11 AM

## 2012-01-25 NOTE — Progress Notes (Signed)
NAME:  [name], [name]           ACCOUNT NO.:  [id number]  MEDICAL RECORD NO.:  [id number]  LOCATION:  [id number]                          FACILITY:  [facility]  PHYSICIAN:  Gwynn Crossley D. Legrand Rams, MD   DATE OF BIRTH:  Oct 25, 1960  DATE OF PROCEDURE:  01/25/2012 DATE OF DISCHARGE:                                PROGRESS NOTE   SUBJECTIVE:  The patient continued to have fever and chills.  He has also some labored breathing.  The patient is also growing enterococcus and yeast in his urine.  He was started on Diflucan yesterday.  The sensitivity pattern is not yet available.  OBJECTIVE:  GENERAL:  The patient is alert, awake, and sick looking. VITAL SIGNS:  Blood pressure 120/65, pulse 59, respiratory rate 22, temperature 97 degrees Fahrenheit. CHEST:  Decreased air entry, few rhonchi. CARDIOVASCULAR SYSTEM:  First and second heart sounds heard.  No murmur. No gallop. ABDOMEN:  Soft and lax.  Bowel sound is positive.  No mass or organomegaly.  EXTREMITIES:  No leg edema.  LABS:  Sodium 134, potassium 3.7, chloride 102, carbon dioxide 17, glucose 280, BUN 34, creatinine 1.42, calcium 8.8.  CBC:  WBC 14.3, hemoglobin 8.8, hematocrit 25.8, and platelets 221.  Chest x-ray continued to show bilateral infiltrates.  ASSESSMENT: 1. Bilateral pneumonia. 2. Urinary tract infection. 3. Acute on chronic renal failure. 4. Bladder outlet obstruction. 5. Mental retardation.  PLAN:  We will continue the patient on current IV fluid according to Nephrology recommendation.  We will add vancomycin to his IV antibiotics pending sensitivity result.  We will continue current treatment and supportive care.     Beata Beason D. Legrand Rams, MD     TDF/MEDQ  D:  01/25/2012  T:  01/25/2012  Job:  [id number]

## 2012-01-25 NOTE — Progress Notes (Signed)
Followup note Reason for followup renal failure.  Patient with mental retardation presently he doesn't offer any complaints. On examination: Patient awake but has this moment seems to be somewhat agitated and restless. Patient is on facemask. His blood pressures 120/65 temperature 98.2 pulse of 93 Chest decreased breath sound otherwise she doesn't have any rales or rhonchi. His heart exam regular rate and rhythm Abdomen soft nontender extremities no edema.  24-hour urine 1500 cc.  Assessment renal failure at this moment seems acute on chronic his BUN is 42 creatinine is 1.92 renal function seems to be stable and has improved. Patient presently none oliguric. Problem #2 history of anemia iron deficiency his hemoglobin is 8.8 hematocrit 25.8 and stable. Problem #3 history of her agitation etiology not clear Problem #4 history of BPH presently with Foley catheter Problem #5 history of low CO2 most likely metabolic.  Problem #6 history of diabetes Problem #7 history of urinary tract infection presently patient is a febrile. Plan: We'll decrease his IV fluid to 50 cc per hour We'll give him Lasix 60 mg IV one dose. We'll check his basic metabolic panel in the morning.

## 2012-01-26 LAB — GLUCOSE, CAPILLARY
Glucose-Capillary: 176 mg/dL — ABNORMAL HIGH (ref 70–99)
Glucose-Capillary: 205 mg/dL — ABNORMAL HIGH (ref 70–99)
Glucose-Capillary: 305 mg/dL — ABNORMAL HIGH (ref 70–99)

## 2012-01-26 LAB — VANCOMYCIN, TROUGH: Vancomycin Tr: 12.5 ug/mL (ref 10.0–20.0)

## 2012-01-26 MED ORDER — ALUM & MAG HYDROXIDE-SIMETH 200-200-20 MG/5ML PO SUSP
30.0000 mL | ORAL | Status: DC | PRN
  Administered 2012-01-27: 30 mL via ORAL
  Filled 2012-01-26: qty 30

## 2012-01-26 NOTE — Progress Notes (Signed)
Followup note Reason for followup renal failure. He is a  patient who has a history of diabetes, mental admission and the BPH  presently and admitted because of hypoglycemia. Because of his mental patient patient does not accommodate that well. On examination: Patient is alert however seems to be somewhat agitated Blood pressure is 1 61/69 temperature 98.9 pulse of 73 Chest decreased breath sound otherwise seems to be clear His heart exam regular rate and rhythm no murmur S3 Abdomen soft positive bowel sound Extremities no edema. His 24-hour urine is 3500.   Assessment: Renal failure at this moment acute on chronic his pending creatinine progressively improving and his creatinine from yesterday was 1.4 returning to his baseline. Patient presently none oliguric. Problem #2 anemia his hemoglobin 8.8 hematocrit was 3.8 Problem #3 history of diabetes Problem #4 history of UTI Problem #5 history of BPH with the chronic Foley catheter. Problem #6 history of mental retardation. Problem #7 possible aspiration pneumonia presently is a febrile and his white blood cell count is improving. Plan: We'll continue with slow hydration.          Basic metabolic panel in the morning.

## 2012-01-26 NOTE — Consult Note (Signed)
ANTIBIOTIC CONSULT NOTE   Pharmacy Consult for Vancomycin Indication: pneumonia  No Known Allergies  Patient Measurements: Height: 5\' 10"  (177.8 cm) Weight: 149 lb 14.6 oz (68 kg) IBW/kg (Calculated) : 73   Vital Signs: Temp: 99.2 F (37.3 C) (05/25 0955) Temp src: Axillary (05/25 0955) BP: 164/76 mmHg (05/25 0708) Pulse Rate: 92  (05/25 0708) Intake/Output from previous day: 05/24 0701 - 05/25 0700 In: 2997.7 [I.V.:2691.7; IV Piggyback:306] Out: 4700 [Urine:4700] Intake/Output from this shift:    Labs:  [facility] 01/25/12 0658 01/24/12 0506  WBC 14.3* 17.2*  HGB 8.8* 8.9*  PLT 221 230  LABCREA -- --  CREATININE 1.42* 1.92*   Estimated Creatinine Clearance: 59.9 ml/min (by C-G formula based on Cr of 1.42).  Basename 01/26/12 0909  VANCOTROUGH 12.5  VANCOPEAK --  VANCORANDOM --  GENTTROUGH --  [place] --  [place] --  AMIKACINPEAK --  AMIKACINTROU --  AMIKACIN --    Microbiology: Recent Results (from the past 720 hour(s))  URINE CULTURE     Status: Normal   Collection Time   01/11/12 10:49 PM      Component Value Range Status Comment   Specimen Description URINE, CATHETERIZED   Final    Special Requests NONE   Final    Culture  Setup Time [id number]   Final    Colony Count >=100,000 COLONIES/ML   Final    Culture     Final    Value: Multiple bacterial morphotypes present, none predominant. Suggest appropriate recollection if clinically indicated.   Report Status 01/13/2012 FINAL   Final   URINE CULTURE     Status: Normal   Collection Time   01/21/12  3:36 PM      Component Value Range Status Comment   Specimen Description URINE, CATHETERIZED   Final    Special Requests NONE   Final    Culture  Setup Time [id number]   Final    Colony Count >=100,000 COLONIES/ML   Final    Culture     Final    Value: ENTEROCOCCUS SPECIES     YEAST   Report Status 01/25/2012 FINAL   Final    Organism ID, Bacteria  ENTEROCOCCUS SPECIES   Final   MRSA PCR SCREENING     Status: Abnormal   Collection Time   01/22/12  9:35 AM      Component Value Range Status Comment   MRSA by PCR INVALID RESULTS, SPECIMEN SENT FOR CULTURE (*) NEGATIVE  Final   MRSA CULTURE     Status: Normal   Collection Time   01/22/12  9:35 AM      Component Value Range Status Comment   Specimen Description NOSE NASAL SWAB   Final    Special Requests NONE   Final    Culture     Final    Value: NO STAPHYLOCOCCUS AUREUS ISOLATED     Note: NOMRSA   Report Status 01/24/2012 FINAL   Final   CULTURE, BLOOD (ROUTINE X 2)     Status: Normal (Preliminary result)   Collection Time   01/24/12  4:59 PM      Component Value Range Status Comment   Specimen Description BLOOD RIGHT HAND   Final    Special Requests BOTTLES DRAWN AEROBIC AND ANAEROBIC 8CC   Final    Culture NO GROWTH 2 DAYS   Final    Report Status PENDING   Incomplete   CULTURE, BLOOD (ROUTINE  X 2)     Status: Normal (Preliminary result)   Collection Time   01/24/12  5:03 PM      Component Value Range Status Comment   Specimen Description BLOOD LEFT HAND   Final    Special Requests BOTTLES DRAWN AEROBIC AND ANAEROBIC 8CC   Final    Culture NO GROWTH 2 DAYS   Final    Report Status PENDING   Incomplete    Medical History: Past Medical History  Diagnosis Date  . Renal disorder   . Elective mutism   . Autism   . Hyperosmolar (nonketotic) coma   . Hyperglycemia   . Diabetes mellitus   . Hyponatremia   . Mental retardation   . Urinary retention   . BPH (benign prostatic hyperplasia)   . Gastroparesis   . Depression   . Pneumonia   . UTI (urinary tract infection)   . Mental retardation    Medications:  Scheduled:     . azithromycin  500 mg Oral Q24H  . cefTRIAXone (ROCEPHIN)  IV  1 g Intravenous Q24H  . enoxaparin (LOVENOX) injection  40 mg Subcutaneous Q24H  . famotidine  20 mg Oral Daily  . fluconazole  100 mg Oral Daily  . insulin aspart  0-15 Units  Subcutaneous TID WC  . insulin aspart  15 Units Subcutaneous TID WC  . insulin glargine  35 Units Subcutaneous QHS  . lisinopril  10 mg Oral Daily  . polyethylene glycol  17 g Oral q morning - 10a  . sertraline  150 mg Oral Daily  . sodium chloride      . Tamsulosin HCl  0.4 mg Oral Daily  . traZODone  50 mg Oral QHS  . vancomycin  1,000 mg Intravenous Q12H  . ziprasidone  40 mg Oral BID WC   Assessment: Pt currently on Zithromax and Rocephin, adding Vanco Renal dz but SCr improving Estimated Creatinine Clearance: 59.9 ml/min (by C-G formula based on Cr of 1.42). Vancomycin trough is slightly below goal but given pt has h/o renal disorder will not increase Vancomycin as pt improving clinically, fever and leukocytosis resolving.  Goal of Therapy:  Vancomycin trough level 15-20 mcg/ml Eradicate infection.  Plan: Vancomycin 1gm iv q12hrs Check trough weekly Labs per protocol  Hart Robinsons A 01/26/2012,10:25 AM

## 2012-01-26 NOTE — Progress Notes (Signed)
NAME:  [name], [name]           ACCOUNT NO.:  [id number]  MEDICAL RECORD NO.:  [id number]  LOCATION:  [id number]                          FACILITY:  [facility]  PHYSICIAN:  Florentino Laabs D. Legrand Rams, MD   DATE OF BIRTH:  [birth year]  DATE OF PROCEDURE:  01/25/2012 DATE OF DISCHARGE:                                PROGRESS NOTE   SUBJECTIVE:  The patient is feeling slightly better.  He has fever, not able to give history.  His fever is subsiding.  OBJECTIVE:  GENERAL:  The patient is alert, awake, and resting. VITAL SIGNS:  Blood pressure 161/69, pulse 73, respiratory rate 20, temperature 98.9 degrees Fahrenheit. CHEST:  Poor air entry, bilateral rhonchi. CARDIOVASCULAR SYSTEM:  First and second heart sounds heard.  No murmur, no gallop. ABDOMEN:  Soft and lax.  Bowel sound is positive.  No mass or organomegaly. EXTREMITIES:  No leg edema.  ASSESSMENT: 1. Bilateral pneumonia. 2. Urinary tract infection. 3. Mental retardation. 4. Acute renal failure, clinically improved. 5. Diabetes mellitus.  PLAN:  We will continue the patient on combination of IV antibiotics. We will continue IV fluids, and will continue regular medications.     Thanh Mottern D. Legrand Rams, MD     TDF/MEDQ  D:  01/26/2012  T:  01/26/2012  Job:  [id number]

## 2012-01-26 NOTE — Progress Notes (Signed)
Pt noted to be screaming out in pain. Upon assessment, pt grabbing his abdominal area with facial grimacing. When asked if he was in pain, pt nodded yes.  Administered Morphine 2mg  IV per MD orders at 1117. After 25 to 30 mins from administration, pt noted to still be distraught, this time pointing to his chest when asked if he was in pain. 12 lead EKG obtained per standing orders. T-wave abnormality (which was different from previous EKG) noted with NSR and HR in the 70's. BP stable.  Dr. Cindie Laroche verbally made aware here in the hospital as he is on call for Dr. Legrand Rams today. New order received for Mylanta 30 ml every 4 hours as needed for indigestion. Will continue to monitor patient.

## 2012-01-27 LAB — CBC
HCT: 25 % — ABNORMAL LOW (ref 39.0–52.0)
Hemoglobin: 8.4 g/dL — ABNORMAL LOW (ref 13.0–17.0)
MCV: 78.9 fL (ref 78.0–100.0)
RBC: 3.17 MIL/uL — ABNORMAL LOW (ref 4.22–5.81)
WBC: 14.5 10*3/uL — ABNORMAL HIGH (ref 4.0–10.5)

## 2012-01-27 LAB — CLOSTRIDIUM DIFFICILE BY PCR: Toxigenic C. Difficile by PCR: NEGATIVE

## 2012-01-27 LAB — BASIC METABOLIC PANEL
CO2: 17 mEq/L — ABNORMAL LOW (ref 19–32)
Chloride: 94 mEq/L — ABNORMAL LOW (ref 96–112)
Creatinine, Ser: 1.34 mg/dL (ref 0.50–1.35)
GFR calc Af Amer: 70 mL/min — ABNORMAL LOW (ref >90)
Potassium: 3.9 mEq/L (ref 3.5–5.1)

## 2012-01-27 LAB — GLUCOSE, CAPILLARY
Glucose-Capillary: 311 mg/dL — ABNORMAL HIGH (ref 70–99)
Glucose-Capillary: 204 mg/dL — ABNORMAL HIGH (ref 70–99)

## 2012-01-27 MED ORDER — VANCOMYCIN HCL IN DEXTROSE 1-5 GM/200ML-% IV SOLN
INTRAVENOUS | Status: AC
  Filled 2012-01-27: qty 200

## 2012-01-27 MED ORDER — IPRATROPIUM BROMIDE 0.02 % IN SOLN
0.5000 mg | RESPIRATORY_TRACT | Status: DC
  Administered 2012-01-27: 0.5 mg via RESPIRATORY_TRACT
  Filled 2012-01-27: qty 2.5

## 2012-01-27 MED ORDER — ALBUTEROL SULFATE (5 MG/ML) 0.5% IN NEBU
2.5000 mg | INHALATION_SOLUTION | Freq: Four times a day (QID) | RESPIRATORY_TRACT | Status: DC
  Administered 2012-01-27 – 2012-01-30 (×9): 2.5 mg via RESPIRATORY_TRACT
  Filled 2012-01-27 (×8): qty 0.5

## 2012-01-27 MED ORDER — ALBUTEROL SULFATE (5 MG/ML) 0.5% IN NEBU
2.5000 mg | INHALATION_SOLUTION | RESPIRATORY_TRACT | Status: DC
  Administered 2012-01-27: 2.5 mg via RESPIRATORY_TRACT
  Filled 2012-01-27: qty 0.5

## 2012-01-27 MED ORDER — ALBUTEROL SULFATE (5 MG/ML) 0.5% IN NEBU: 2.5000 mg | INHALATION_SOLUTION | RESPIRATORY_TRACT | Status: DC

## 2012-01-27 MED ORDER — IPRATROPIUM BROMIDE 0.02 % IN SOLN
0.5000 mg | Freq: Four times a day (QID) | RESPIRATORY_TRACT | Status: DC
  Administered 2012-01-27 – 2012-01-30 (×9): 0.5 mg via RESPIRATORY_TRACT
  Filled 2012-01-27 (×9): qty 2.5

## 2012-01-27 MED ORDER — IPRATROPIUM BROMIDE 0.02 % IN SOLN: 0.5000 mg | Freq: Four times a day (QID) | RESPIRATORY_TRACT | Status: DC

## 2012-01-27 NOTE — Progress Notes (Signed)
CBG: 49  Treatment: 15 GM carbohydrate snack  Symptoms: None  Follow-up CBG: Time:1305 CBG Result:150  Possible Reasons for Event: Inadequate meal intake  Comments/MD notified: Dr. Forest Becker, Francetta Found

## 2012-01-27 NOTE — Progress Notes (Signed)
Pt had one episode of diarrhea. Dr. Cindie Laroche paged and made aware. New order received for C. Diff PCR and Brown Contact Precautions. Will continue to monitor.

## 2012-01-27 NOTE — Progress Notes (Signed)
Her followup note Reason for followup acute kidney injury He is a patient who has history of her mental patient, BPH with chronic Foley catheter her presently was brought because of her hypoglycemia and patient was found to elevated pending creatinine. Her presently her because of her his mental status very difficult to the her to get any additional history. On examination the patient is a alert he seems to be somewhat restless is worse no significant change. His blood pressure is 166/73, temperature 98.3, pulse of 92. Chest decreased breath sound otherwise seems to be clear 30 exam revealed regular rate and rhythm Extremities no edema History is 2700 with output of 1800 is +200 cc.  Problem #1 acute kidney injury his BUN is 27 and creatinine is 1.34 renal function is recovering andhis potassium is normal. Problem #2 history of diabetes Problem #3 history of her urine tract infection Problem #4 history of pneumonia Problem #5 history of anemia  presently his iron saturation is notable to be calculated he is low iron however his ferritin seems to be high. Patient has this moment seems to have iron deficiency anemia his hemoglobin is 8.8 hematocrit is 25.8 stable Problem #6 history of BPH with chronic Foley catheter Problem #7 history of mental retardation. Plan: At this moment continue his present management Since renal function has recovered her SignOff thank you

## 2012-01-28 LAB — GLUCOSE, CAPILLARY
Glucose-Capillary: 556 mg/dL (ref 70–99)
Glucose-Capillary: 184 mg/dL — ABNORMAL HIGH (ref 70–99)

## 2012-01-28 MED ORDER — HYOSCYAMINE SULFATE 0.125 MG PO TABS
0.1250 mg | ORAL_TABLET | ORAL | Status: DC | PRN
  Filled 2012-01-28: qty 2

## 2012-01-28 NOTE — Progress Notes (Signed)
NAME:  [name], [name]           ACCOUNT NO.:  [id number]  MEDICAL RECORD NO.:  [id number]  LOCATION:  [id number]                          FACILITY:  [facility]  PHYSICIAN:  Romayne Ticas D. Legrand Rams, MD   DATE OF BIRTH:  10/23/1960  DATE OF PROCEDURE:  01/27/2012 DATE OF DISCHARGE:                                PROGRESS NOTE   SUBJECTIVE:  The patient feels better.  He has some congestion and cough.  No fever or chills.  OBJECTIVE:  GENERAL:  The patient is alert, awake, and resting. VITAL SIGNS:  Blood pressure 136/86, pulse 91, respiratory rate 20, temperature 98 degrees Fahrenheit. CHEST:  Decreased air entry, few rhonchi. CARDIOVASCULAR SYSTEM:  First and second heart sounds heard.  No murmur. No gallop. ABDOMEN:  Soft, and lax.  Bowel sound is positive.  No mass or organomegaly. EXTREMITIES:  No leg edema.  ASSESSMENT: 1. Bilateral pneumonia. 2. Enterococcus species urinary tract infection . 3. Renal insufficiency, clinically improved. 4. Diabetes mellitus. 5. Mental retardation. 6. Bladder outlet obstruction.  PLAN:  We will continue the patient on combination of IV antibiotics. Continue regular medications.  Continue supportive care.     Ailynn Gow D. Legrand Rams, MD     TDF/MEDQ  D:  01/27/2012  T:  01/28/2012  Job:  [id number]

## 2012-01-28 NOTE — Progress Notes (Signed)
Blood sugars continue high. Appetite poor

## 2012-01-28 NOTE — Progress Notes (Signed)
NAME:  [name], [name]           ACCOUNT NO.:  [id number]  MEDICAL RECORD NO.:  [id number]  LOCATION:  [id number]                          FACILITY:  [facility]  PHYSICIAN:  Izak Anding D. Legrand Rams, MD   DATE OF BIRTH:  September 23, 1960  DATE OF PROCEDURE:  01/28/2012 DATE OF DISCHARGE:                                PROGRESS NOTE   SUBJECTIVE:  The patient still have cough and shortness of breath and his fever has subsided.  OBJECTIVE:  GENERAL:  The patient is alert, awake, sick looking. VITAL SIGNS:  Blood pressure 99/48, pulse 91, respiratory rate 20, temperature 97.9 degrees Fahrenheit. CHEST:  Decreased air entry, few rhonchi. CARDIOVASCULAR SYSTEM:  First and second heart sounds heard.  No murmur. No gallop. ABDOMEN:  Soft and lax.  Bowel sound is positive.  No mass or organomegaly. EXTREMITIES:  No leg edema.  ASSESSMENT: 1. Bilateral pneumonia. 2. Probably urinary tract infection. 3. Mental retardation. 4. Diabetes mellitus.  PLAN:  Continue the patient on combination IV antibiotics.  Continue current respiratory therapy.  Continue current treatment and supportive care.     Breawna Montenegro D. Legrand Rams, MD     TDF/MEDQ  D:  01/28/2012  T:  01/28/2012  Job:  [id number]

## 2012-01-29 ENCOUNTER — Inpatient Hospital Stay (HOSPITAL_COMMUNITY): Payer: Medicaid Other

## 2012-01-29 LAB — GLUCOSE, CAPILLARY
Glucose-Capillary: 277 mg/dL — ABNORMAL HIGH (ref 70–99)
Glucose-Capillary: 259 mg/dL — ABNORMAL HIGH (ref 70–99)

## 2012-01-29 LAB — CULTURE, BLOOD (ROUTINE X 2): Culture: NO GROWTH

## 2012-01-29 MED ORDER — ALPRAZOLAM 0.5 MG PO TABS
0.5000 mg | ORAL_TABLET | Freq: Two times a day (BID) | ORAL | Status: DC | PRN
  Administered 2012-01-29 – 2012-02-23 (×10): 0.5 mg via ORAL
  Filled 2012-01-29 (×12): qty 1

## 2012-01-29 MED ORDER — GLUCERNA SHAKE PO LIQD: 237.0000 mL | Freq: Two times a day (BID) | ORAL | Status: DC

## 2012-01-29 NOTE — Consult Note (Signed)
ANTIBIOTIC CONSULT NOTE   Pharmacy Consult for Vancomycin Indication: UTI  No Known Allergies  Patient Measurements: Height: 5\' 10"  (177.8 cm) Weight: 149 lb 14.6 oz (68 kg) IBW/kg (Calculated) : 73   Vital Signs: BP: 150/71 mmHg (05/28 0521) Pulse Rate: 93  (05/28 0521) Intake/Output from previous day: 05/27 0701 - 05/28 0700 In: 180 [P.O.:180] Out: -  Intake/Output from this shift:    Labs:  [facility] 01/27/12 0737  WBC 14.5*  HGB 8.4*  PLT 270  LABCREA --  CREATININE 1.34   Estimated Creatinine Clearance: 63.4 ml/min (by C-G formula based on Cr of 1.34). No results found for this basename: VANCOTROUGH:2,VANCOPEAK:2,VANCORANDOM:2,GENTTROUGH:2,GENTPEAK:2,GENTRANDOM:2,TOBRATROUGH:2,TOBRAPEAK:2,TOBRARND:2,AMIKACINPEAK:2,AMIKACINTROU:2,AMIKACIN:2, in the last 72 hours  Microbiology: Recent Results (from the past 720 hour(s))  URINE CULTURE     Status: Normal   Collection Time   01/11/12 10:49 PM      Component Value Range Status Comment   Specimen Description URINE, CATHETERIZED   Final    Special Requests NONE   Final    Culture  Setup Time [id number]   Final    Colony Count >=100,000 COLONIES/ML   Final    Culture     Final    Value: Multiple bacterial morphotypes present, none predominant. Suggest appropriate recollection if clinically indicated.   Report Status 01/13/2012 FINAL   Final   URINE CULTURE     Status: Normal   Collection Time   01/21/12  3:36 PM      Component Value Range Status Comment   Specimen Description URINE, CATHETERIZED   Final    Special Requests NONE   Final    Culture  Setup Time [id number]   Final    Colony Count >=100,000 COLONIES/ML   Final    Culture     Final    Value: ENTEROCOCCUS SPECIES     YEAST   Report Status 01/25/2012 FINAL   Final    Organism ID, Bacteria ENTEROCOCCUS SPECIES   Final   MRSA PCR SCREENING     Status: Abnormal   Collection Time   01/22/12  9:35 AM      Component Value Range Status Comment   MRSA by  PCR INVALID RESULTS, SPECIMEN SENT FOR CULTURE (*) NEGATIVE  Final   MRSA CULTURE     Status: Normal   Collection Time   01/22/12  9:35 AM      Component Value Range Status Comment   Specimen Description NOSE NASAL SWAB   Final    Special Requests NONE   Final    Culture     Final    Value: NO STAPHYLOCOCCUS AUREUS ISOLATED     Note: NOMRSA   Report Status 01/24/2012 FINAL   Final   CULTURE, BLOOD (ROUTINE X 2)     Status: Normal   Collection Time   01/24/12  4:59 PM      Component Value Range Status Comment   Specimen Description BLOOD RIGHT HAND   Final    Special Requests BOTTLES DRAWN AEROBIC AND ANAEROBIC 8CC   Final    Culture NO GROWTH 5 DAYS   Final    Report Status 01/29/2012 FINAL   Final   CULTURE, BLOOD (ROUTINE X 2)     Status: Normal   Collection Time   01/24/12  5:03 PM      Component Value Range Status Comment   Specimen Description BLOOD LEFT HAND   Final    Special Requests BOTTLES DRAWN AEROBIC AND ANAEROBIC 8CC   Final  Culture NO GROWTH 5 DAYS   Final    Report Status 01/29/2012 FINAL   Final   CLOSTRIDIUM DIFFICILE BY PCR     Status: Normal   Collection Time   01/27/12  9:00 PM      Component Value Range Status Comment   C difficile by pcr NEGATIVE  NEGATIVE  Final    Medical History: Past Medical History  Diagnosis Date  . Renal disorder   . Elective mutism   . Autism   . Hyperosmolar (nonketotic) coma   . Hyperglycemia   . Diabetes mellitus   . Hyponatremia   . Mental retardation   . Urinary retention   . BPH (benign prostatic hyperplasia)   . Gastroparesis   . Depression   . Pneumonia   . UTI (urinary tract infection)   . Mental retardation    Medications:  Scheduled:     . albuterol  2.5 mg Nebulization Q6H   And  . ipratropium  0.5 mg Nebulization Q6H  . cefTRIAXone (ROCEPHIN)  IV  1 g Intravenous Q24H  . enoxaparin (LOVENOX) injection  40 mg Subcutaneous Q24H  . famotidine  20 mg Oral Daily  . fluconazole  100 mg Oral Daily  .  insulin aspart  0-15 Units Subcutaneous TID WC  . insulin aspart  15 Units Subcutaneous TID WC  . insulin glargine  35 Units Subcutaneous QHS  . lisinopril  10 mg Oral Daily  . polyethylene glycol  17 g Oral q morning - 10a  . sertraline  150 mg Oral Daily  . Tamsulosin HCl  0.4 mg Oral Daily  . traZODone  50 mg Oral QHS  . vancomycin  1,000 mg Intravenous Q12H  . ziprasidone  40 mg Oral BID WC  . DISCONTD: azithromycin  500 mg Oral Q24H   Assessment: Pt currently on day#9 Rocephin, day#5 Vancomycin and day#4 diflucan for CAP and enterococcal UTI.   Renal function has improved since admission.  Vancomycin trough was at goal range for UTI on 01/26/12.   Goal of Therapy:  Vancomycin trough level 10-15 Eradicate infection.  Plan: 1) Continue Vancomycin 1gm iv q12hrs 2) Check trough & scr weekly while on vancomycin-next due 5/31.  3) Consider d/c or de-escalate/change to oral antibiotic regimen.   Biagio Borg 01/29/2012,10:40 AM

## 2012-01-29 NOTE — Progress Notes (Signed)
NAME:  [name], [name]           ACCOUNT NO.:  [id number]  MEDICAL RECORD NO.:  [id number]  LOCATION:  [id number]                          FACILITY:  [facility]  PHYSICIAN:  Lilias Lorensen D. Legrand Rams, MD   DATE OF BIRTH:  1960/10/14  DATE OF PROCEDURE:  01/29/2012 DATE OF DISCHARGE:                                PROGRESS NOTE   SUBJECTIVE:  The patient continued to complain of congestion and cough. He has some shortness of breath.  However, his fever is subsiding.  OBJECTIVE:  GENERAL:  The patient is alert, awake, and sick looking. VITAL SIGNS:  Blood pressure 92/51, pulse 89, respiratory rate 20, temperature 97 degrees Fahrenheit. CHEST:  Decreased air entry, few rhonchi. CARDIOVASCULAR:  First and second heart sounds heard.  No murmur.  No gallop. ABDOMEN:  Soft and lax.  Bowel sound is positive.  No mass or organomegaly. EXTREMITIES:  No leg edema.  ASSESSMENT: 1. Bilateral pneumonia. 2. Enterococcal urinary tract infection. 3. Renal failure, improved. 4. Bladder outlet obstruction. 5. Mental retardation. 6. Diabetes mellitus.  PLAN:  We will repeat chest x-ray.  Continue on vancomycin and Rocephin. We will discontinue Zithromax.  We will continue on nebulizer treatment and oxygen therapy.     Gaye Scorza D. Legrand Rams, MD     TDF/MEDQ  D:  01/29/2012  T:  01/29/2012  Job:  [id number]

## 2012-01-29 NOTE — Progress Notes (Signed)
INITIAL ADULT NUTRITION ASSESSMENT Date: 01/29/2012   Time: 4:55 PM Reason for Assessment: poor appetite and decreased po intake  ASSESSMENT: Male 51 y.o.   Dx: Bilateral PNA, UTI   Past Medical History  Diagnosis Date  . Renal disorder   . Elective mutism   . Autism   . Hyperosmolar (nonketotic) coma   . Hyperglycemia   . Diabetes mellitus   . Hyponatremia   . Mental retardation   . Urinary retention   . BPH (benign prostatic hyperplasia)   . Gastroparesis   . Depression   . Pneumonia   . UTI (urinary tract infection)   . Mental retardation      Scheduled Meds:   . albuterol  2.5 mg Nebulization Q6H   And  . ipratropium  0.5 mg Nebulization Q6H  . cefTRIAXone (ROCEPHIN)  IV  1 g Intravenous Q24H  . enoxaparin (LOVENOX) injection  40 mg Subcutaneous Q24H  . famotidine  20 mg Oral Daily  . feeding supplement  237 mL Oral BID BM  . fluconazole  100 mg Oral Daily  . insulin aspart  0-15 Units Subcutaneous TID WC  . insulin aspart  15 Units Subcutaneous TID WC  . insulin glargine  35 Units Subcutaneous QHS  . lisinopril  10 mg Oral Daily  . polyethylene glycol  17 g Oral q morning - 10a  . sertraline  150 mg Oral Daily  . Tamsulosin HCl  0.4 mg Oral Daily  . traZODone  50 mg Oral QHS  . vancomycin  1,000 mg Intravenous Q12H  . ziprasidone  40 mg Oral BID WC  . DISCONTD: azithromycin  500 mg Oral Q24H   Continuous Infusions:   . DISCONTD: sodium chloride 1,000 mL (01/28/12 1020)   PRN Meds:.acetaminophen, ALPRAZolam, alum & mag hydroxide-simeth, hyoscyamine, morphine injection, ondansetron  Ht: 5\' 10"  (177.8 cm)  Wt: 149 lb 14.6 oz (68 kg)  Ideal Wt: 73 kg % Ideal Wt: 94%  Usual Wt: 165#  (01/03/12) % Usual Wt: 91%  Body mass index is 21.51 kg/(m^2).Normal  Food/Nutrition Related Hx: Pt unable to provide hx. He is from ALF. CHO Mod Medium diet with poor po intake 10% of recent meals. He feeds himself with tray set-up. Skin intact. Severe wt loss of 15#,  9% < 30 d. He meets criteria for non-severe malnutrition in acute illness given his unplanned wt loss and energy intake <75% for >7d.    CMP     Component Value Date/Time   NA 128* 01/27/2012 0737   K 3.9 01/27/2012 0737   CL 94* 01/27/2012 0737   CO2 17* 01/27/2012 0737   GLUCOSE 289* 01/27/2012 0737   BUN 27* 01/27/2012 0737   CREATININE 1.34 01/27/2012 0737   CALCIUM 8.9 01/27/2012 0737   PROT 7.8 01/21/2012 1154   ALBUMIN 3.8 01/21/2012 1154   AST 28 01/21/2012 1154   ALT 29 01/21/2012 1154   ALKPHOS 74 01/21/2012 1154   BILITOT 0.3 01/21/2012 1154   GFRNONAA 60* 01/27/2012 0737   GFRAA 70* 01/27/2012 0737    Intake/Output Summary (Last 24 hours) at 01/29/12 1707 Last data filed at 01/29/12 1200  Gross per 24 hour  Intake     50 ml  Output      0 ml  Net     50 ml   Diet Order: Carb Control  Supplements/Tube Feeding:Glucerna BID added  IVF:    DISCONTD: sodium chloride Last Rate: 1,000 mL (01/28/12 1020)    Estimated Nutritional  Needs:   [id number] kcal per day Protein:95-115 gr per day Fluid:1 ml/kcal  NUTRITION DIAGNOSIS: -Inadequate oral intake (NI-2.1).  Status: Ongoing  RELATED TO: Bilateral PNA, UTI  AS EVIDENCE BY: RN reported decr appetite and po intake and wt records  MONITORING/EVALUATION(Goals): -Monitor meal and suppl intake, wt changes GOC: pt will increase oral intake to meet >80% of est nutr needs and prevent further wt loss and improve glucose control.  EDUCATION NEEDS: -Education not appropriate at this time  INTERVENTION: -Glucerna BID between meals -Rec staff to assist with feeding and encourage oral intake -RD will follow for nutrition needs  Dietitian [phone]  DOCUMENTATION CODES Per approved criteria  -Non-severe (moderate) malnutrition in the context of acute illness or injury    Frederik Schmidt 01/29/2012, 4:55 PM

## 2012-01-29 NOTE — Progress Notes (Signed)
Inpatient Diabetes Program Recommendations  AACE/ADA: New Consensus Statement on Inpatient Glycemic Control (2009)  Target Ranges:  Prepandial:   less than 140 mg/dL      Peak postprandial:   less than 180 mg/dL (1-2 hours)      Critically ill patients:  140 - 180 mg/dL   Reason for Visit: Results for [name], [name] (MRN [id number]) as of 01/29/2012 14:19  Ref. Range 01/28/2012 11:40 01/28/2012 16:41 01/28/2012 21:33 01/29/2012 07:59 01/29/2012 11:28  Glucose-Capillary Latest Range: 70-99 mg/dL 433 (H) 323 (H) 184 (H) 304 (H) 251 (H)   CBG's up and down.  Fasting CBG's continue to be elevated.  RN states that patient is not eating 50% and therefore did not get Novolog meal coverage with breakfast.  May need increase in Basal insulin Lantus?  Discussed with RN and will follow.

## 2012-01-30 ENCOUNTER — Inpatient Hospital Stay (HOSPITAL_COMMUNITY): Payer: Medicaid Other

## 2012-01-30 LAB — BLOOD GAS, ARTERIAL
Acid-base deficit: 18.7 mmol/L — ABNORMAL HIGH (ref 0.0–2.0)
Acid-base deficit: 20.4 mmol/L — ABNORMAL HIGH (ref 0.0–2.0)
Bicarbonate: 8.2 mEq/L — ABNORMAL LOW (ref 20.0–24.0)
Bicarbonate: 9.8 mEq/L — ABNORMAL LOW (ref 20.0–24.0)
Bicarbonate: 15.6 mEq/L — ABNORMAL LOW (ref 20.0–24.0)
Drawn by: 22223
Drawn by: 23534
FIO2: 50 %
FIO2: 0.65 %
O2 Content: 65 L/min
O2 Saturation: 98.1 %
O2 Saturation: 88.9 %
O2 Saturation: 98.7 %
PEEP: 5 cmH2O
Patient temperature: 37
RATE: 20 resp/min
TCO2: 9.6 mmol/L (ref 0–100)
TCO2: 8.5 mmol/L (ref 0–100)
TCO2: 10.4 mmol/L (ref 0–100)
pH, Arterial: 7.086 — CL (ref 7.350–7.450)
pH, Arterial: 7.247 — ABNORMAL LOW (ref 7.350–7.450)
pO2, Arterial: 224 mmHg — ABNORMAL HIGH (ref 80.0–100.0)
pO2, Arterial: 187 mmHg — ABNORMAL HIGH (ref 80.0–100.0)

## 2012-01-30 LAB — CBC
HCT: 22.3 % — ABNORMAL LOW (ref 39.0–52.0)
MCHC: 32.7 g/dL (ref 30.0–36.0)
MCV: 81.4 fL (ref 78.0–100.0)
Platelets: 327 10*3/uL (ref 150–400)
RDW: 14.3 % (ref 11.5–15.5)

## 2012-01-30 LAB — GLUCOSE, CAPILLARY
Glucose-Capillary: 393 mg/dL — ABNORMAL HIGH (ref 70–99)
Glucose-Capillary: 554 mg/dL (ref 70–99)
Glucose-Capillary: 357 mg/dL — ABNORMAL HIGH (ref 70–99)
Glucose-Capillary: 224 mg/dL — ABNORMAL HIGH (ref 70–99)
Glucose-Capillary: 202 mg/dL — ABNORMAL HIGH (ref 70–99)
Glucose-Capillary: 149 mg/dL — ABNORMAL HIGH (ref 70–99)

## 2012-01-30 LAB — PREPARE RBC (CROSSMATCH)

## 2012-01-30 LAB — ABO/RH: ABO/RH(D): A POS

## 2012-01-30 LAB — BASIC METABOLIC PANEL
BUN: 55 mg/dL — ABNORMAL HIGH (ref 6–23)
CO2: 21 mEq/L (ref 19–32)
Calcium: 9.2 mg/dL (ref 8.4–10.5)
Calcium: 8.7 mg/dL (ref 8.4–10.5)
Chloride: 97 mEq/L (ref 96–112)
Creatinine, Ser: 2.85 mg/dL — ABNORMAL HIGH (ref 0.50–1.35)
Creatinine, Ser: 3.19 mg/dL — ABNORMAL HIGH (ref 0.50–1.35)
GFR calc Af Amer: 28 mL/min — ABNORMAL LOW (ref >90)
GFR calc Af Amer: 25 mL/min — ABNORMAL LOW (ref >90)
GFR calc Af Amer: 27 mL/min — ABNORMAL LOW (ref >90)
GFR calc non Af Amer: 24 mL/min — ABNORMAL LOW (ref >90)
GFR calc non Af Amer: 21 mL/min — ABNORMAL LOW (ref >90)
GFR calc non Af Amer: 23 mL/min — ABNORMAL LOW (ref >90)
Sodium: 133 mEq/L — ABNORMAL LOW (ref 135–145)

## 2012-01-30 LAB — LACTIC ACID, PLASMA: Lactic Acid, Venous: 2.3 mmol/L — ABNORMAL HIGH (ref 0.5–2.2)

## 2012-01-30 LAB — CARBOXYHEMOGLOBIN
O2 Saturation: 82 %
Total hemoglobin: 6.5 g/dL — CL (ref 13.5–18.0)
Total oxygen content: 7.3 mL/dL — ABNORMAL LOW (ref 15.0–23.0)

## 2012-01-30 LAB — SODIUM, URINE, RANDOM: Sodium, Ur: 27 mEq/L

## 2012-01-30 LAB — CREATININE, URINE, RANDOM: Creatinine, Urine: 136.9 mg/dL

## 2012-01-30 MED ORDER — HYDROCORTISONE SOD SUCCINATE 100 MG IJ SOLR
50.0000 mg | Freq: Four times a day (QID) | INTRAMUSCULAR | Status: DC
  Administered 2012-01-30 – 2012-02-10 (×44): 50 mg via INTRAVENOUS
  Filled 2012-01-30 (×42): qty 2

## 2012-01-30 MED ORDER — INSULIN ASPART 100 UNIT/ML ~~LOC~~ SOLN
20.0000 [IU] | Freq: Once | SUBCUTANEOUS | Status: AC
  Administered 2012-01-30: 20 [IU] via SUBCUTANEOUS

## 2012-01-30 MED ORDER — INSULIN GLARGINE 100 UNIT/ML ~~LOC~~ SOLN: 40.0000 [IU] | Freq: Every day | SUBCUTANEOUS | Status: DC

## 2012-01-30 MED ORDER — DEXTROSE-NACL 5-0.45 % IV SOLN
INTRAVENOUS | Status: DC
  Administered 2012-01-30: 17:00:00 via INTRAVENOUS

## 2012-01-30 MED ORDER — NOREPINEPHRINE BITARTRATE 1 MG/ML IJ SOLN
2.0000 ug/min | INTRAVENOUS | Status: DC | PRN
  Administered 2012-01-30: 4 ug/min via INTRAVENOUS
  Administered 2012-01-30: 10 ug/min via INTRAVENOUS
  Filled 2012-01-30 (×3): qty 4

## 2012-01-30 MED ORDER — PANTOPRAZOLE SODIUM 40 MG IV SOLR
40.0000 mg | Freq: Every day | INTRAVENOUS | Status: DC
  Administered 2012-01-30 – 2012-02-05 (×7): 40 mg via INTRAVENOUS
  Filled 2012-01-30 (×8): qty 40

## 2012-01-30 MED ORDER — DEXTROSE 50 % IV SOLN
25.0000 mL | INTRAVENOUS | Status: DC | PRN
  Administered 2012-02-03 – 2012-02-22 (×3): 25 mL via INTRAVENOUS

## 2012-01-30 MED ORDER — SODIUM CHLORIDE 0.9 % IV BOLUS (SEPSIS)
1000.0000 mL | Freq: Once | INTRAVENOUS | Status: AC
  Administered 2012-01-30: 1000 mL via INTRAVENOUS

## 2012-01-30 MED ORDER — FUROSEMIDE 10 MG/ML IJ SOLN
40.0000 mg | Freq: Once | INTRAMUSCULAR | Status: AC
  Administered 2012-01-30: 40 mg via INTRAVENOUS
  Filled 2012-01-30: qty 4

## 2012-01-30 MED ORDER — SODIUM CHLORIDE 0.9 % IV SOLN: INTRAVENOUS | Status: DC

## 2012-01-30 MED ORDER — SODIUM CHLORIDE 0.9 % IV BOLUS (SEPSIS)
500.0000 mL | Freq: Once | INTRAVENOUS | Status: AC
  Administered 2012-01-30: 500 mL via INTRAVENOUS

## 2012-01-30 MED ORDER — SUCCINYLCHOLINE CHLORIDE 20 MG/ML IJ SOLN
INTRAMUSCULAR | Status: AC
  Filled 2012-01-30: qty 1

## 2012-01-30 MED ORDER — VANCOMYCIN HCL IN DEXTROSE 1-5 GM/200ML-% IV SOLN
1000.0000 mg | INTRAVENOUS | Status: DC
  Administered 2012-01-30: 1000 mg via INTRAVENOUS
  Filled 2012-01-30: qty 200

## 2012-01-30 MED ORDER — ENOXAPARIN SODIUM 30 MG/0.3ML ~~LOC~~ SOLN
30.0000 mg | SUBCUTANEOUS | Status: DC
  Administered 2012-01-30 – 2012-01-31 (×2): 30 mg via SUBCUTANEOUS
  Filled 2012-01-30 (×2): qty 0.3

## 2012-01-30 MED ORDER — BIOTENE DRY MOUTH MT LIQD
15.0000 mL | Freq: Four times a day (QID) | OROMUCOSAL | Status: DC
  Administered 2012-01-30 – 2012-02-23 (×90): 15 mL via OROMUCOSAL

## 2012-01-30 MED ORDER — SODIUM CHLORIDE 0.9 % IJ SOLN
10.0000 mL | INTRAMUSCULAR | Status: DC | PRN
  Filled 2012-01-30 (×4): qty 3

## 2012-01-30 MED ORDER — LIDOCAINE HCL (CARDIAC) 20 MG/ML IV SOLN
INTRAVENOUS | Status: AC
  Filled 2012-01-30: qty 5

## 2012-01-30 MED ORDER — MIDAZOLAM HCL 2 MG/2ML IJ SOLN
1.0000 mg | INTRAMUSCULAR | Status: DC | PRN
  Administered 2012-01-30 – 2012-02-01 (×9): 1 mg via INTRAVENOUS
  Filled 2012-01-30 (×10): qty 2

## 2012-01-30 MED ORDER — CHLORHEXIDINE GLUCONATE 0.12 % MT SOLN
15.0000 mL | Freq: Two times a day (BID) | OROMUCOSAL | Status: DC
  Administered 2012-01-30 – 2012-02-23 (×47): 15 mL via OROMUCOSAL
  Filled 2012-01-30 (×47): qty 15

## 2012-01-30 MED ORDER — STERILE WATER FOR INJECTION IV SOLN
INTRAVENOUS | Status: DC
  Administered 2012-01-30 – 2012-02-01 (×4): via INTRAVENOUS
  Filled 2012-01-30 (×5): qty 9.7

## 2012-01-30 MED ORDER — SODIUM CHLORIDE 0.9 % IV BOLUS (SEPSIS)
500.0000 mL | INTRAVENOUS | Status: DC | PRN
  Administered 2012-01-30 (×3): 500 mL via INTRAVENOUS

## 2012-01-30 MED ORDER — SODIUM BICARBONATE 8.4 % IV SOLN
100.0000 meq | Freq: Once | INTRAVENOUS | Status: AC
  Administered 2012-01-30: 100 meq via INTRAVENOUS
  Filled 2012-01-30: qty 100

## 2012-01-30 MED ORDER — SODIUM CHLORIDE 0.9 % IV SOLN
INTRAVENOUS | Status: DC
  Administered 2012-01-30: 10:00:00 via INTRAVENOUS
  Filled 2012-01-30 (×2): qty 100

## 2012-01-30 MED ORDER — MIDAZOLAM HCL 2 MG/2ML IJ SOLN
INTRAMUSCULAR | Status: AC
  Filled 2012-01-30: qty 8

## 2012-01-30 MED ORDER — DOBUTAMINE IN D5W 4-5 MG/ML-% IV SOLN: 2.5000 ug/kg/min | INTRAVENOUS | Status: DC | PRN

## 2012-01-30 MED ORDER — NOREPINEPHRINE BITARTRATE 1 MG/ML IJ SOLN: 2.0000 ug/min | INTRAVENOUS | Status: DC

## 2012-01-30 MED ORDER — INSULIN REGULAR BOLUS VIA INFUSION
0.0000 [IU] | Freq: Three times a day (TID) | INTRAVENOUS | Status: DC
  Filled 2012-01-30: qty 10

## 2012-01-30 MED ORDER — LEVOFLOXACIN IN D5W 750 MG/150ML IV SOLN
750.0000 mg | Freq: Once | INTRAVENOUS | Status: AC
  Administered 2012-01-30: 750 mg via INTRAVENOUS
  Filled 2012-01-30: qty 150

## 2012-01-30 MED ORDER — ROCURONIUM BROMIDE 50 MG/5ML IV SOLN
INTRAVENOUS | Status: AC
  Filled 2012-01-30: qty 2

## 2012-01-30 MED ORDER — SODIUM CHLORIDE 0.9 % IJ SOLN
INTRAMUSCULAR | Status: AC
  Filled 2012-01-30: qty 6

## 2012-01-30 MED ORDER — SODIUM CHLORIDE 0.9 % IJ SOLN
10.0000 mL | Freq: Two times a day (BID) | INTRAMUSCULAR | Status: DC
  Administered 2012-01-30 – 2012-01-31 (×2): 10 mL
  Administered 2012-02-01: 20 mL
  Administered 2012-02-01 – 2012-02-08 (×13): 10 mL
  Administered 2012-02-08: 20 mL
  Filled 2012-01-30 (×7): qty 3
  Filled 2012-01-30: qty 9
  Filled 2012-01-30 (×2): qty 3
  Filled 2012-01-30: qty 9
  Filled 2012-01-30: qty 3

## 2012-01-30 MED ORDER — ETOMIDATE 2 MG/ML IV SOLN
INTRAVENOUS | Status: AC
  Filled 2012-01-30: qty 20

## 2012-01-30 MED ORDER — IPRATROPIUM BROMIDE 0.02 % IN SOLN
0.5000 mg | RESPIRATORY_TRACT | Status: DC
  Administered 2012-01-30 – 2012-02-14 (×92): 0.5 mg via RESPIRATORY_TRACT
  Filled 2012-01-30 (×92): qty 2.5

## 2012-01-30 MED ORDER — SODIUM CHLORIDE 0.9 % IV SOLN
INTRAVENOUS | Status: DC
  Administered 2012-01-30: 4.9 [IU]/h via INTRAVENOUS
  Administered 2012-01-30: 19:00:00 via INTRAVENOUS
  Administered 2012-01-31: 21.3 [IU]/h via INTRAVENOUS
  Administered 2012-01-31: 5.8 [IU]/h via INTRAVENOUS
  Filled 2012-01-30 (×4): qty 1

## 2012-01-30 MED ORDER — ALBUTEROL SULFATE (5 MG/ML) 0.5% IN NEBU
2.5000 mg | INHALATION_SOLUTION | RESPIRATORY_TRACT | Status: DC
  Administered 2012-01-30 – 2012-02-14 (×93): 2.5 mg via RESPIRATORY_TRACT
  Filled 2012-01-30 (×91): qty 0.5

## 2012-01-30 NOTE — Progress Notes (Signed)
CRITICAL VALUE ALERT  Critical value received:  co2 9  Date of notification:  01/30/12  Time of notification:  0709  Critical value read back: yes  Nurse who received alert:  Santiago Glad Gaylin Osoria  MD notified (1st page):  [phone]  Time of first page:  0711  MD notified (2nd page):  Time of second page:  Responding MD:    Time MD responded:

## 2012-01-30 NOTE — Procedures (Signed)
Central Venous Catheter Insertion Procedure Note DAVD GLAZEBROOK [id number] [birth year]  Procedure: Insertion of Central Venous Catheter Indications: Assessment of intravascular volume and Drug and/or fluid administration  Procedure Details Consent: Unable to obtain consent because of altered level of consciousness. Time Out: Verified patient identification, verified procedure, site/side was marked, verified correct patient position, special equipment/implants available, medications/allergies/relevent history reviewed, required imaging and test results available.  Performed  Maximum sterile technique was used including antiseptics, gloves, gown, hand hygiene, mask and sheet. Skin prep: Chlorhexidine; local anesthetic administered A antimicrobial bonded/coated triple lumen catheter was placed in the left subclavian vein using the Seldinger technique.  Evaluation Blood flow good Complications: No apparent complications Patient did tolerate procedure well. Chest X-ray ordered to verify placement.  CXR: pending.  Doyl Bitting A 01/30/2012, 10:23 AM

## 2012-01-30 NOTE — Progress Notes (Deleted)
Report given to Ivin Poot, RN. Being transferred to tele floor room 341. Pt alert, oriented and in stable condition at the time of transport.

## 2012-01-30 NOTE — Progress Notes (Signed)
CRITICAL VALUE ALERT  Critical value received:  ABG results see lab value  Date of notification:  01/30/12  Time of notification:  0830  Critical value read back:yes  Nurse who received alert:  Sharen Hones, RN  MD notified (1st page):  Nida  Time of first page:  0830  MD notified (2nd page):  Time of second page:  Responding MD:  [id number]  Time MD responded:  [phone]

## 2012-01-30 NOTE — Progress Notes (Signed)
Notified Reggie Ulice Brilliant the Director of [facility] of a change in the patients condition.  Reggie states that they assume all care for him and that they do not have a family contact for him.

## 2012-01-30 NOTE — Consult Note (Signed)
[name] MRN: [id number] DOB/AGE: [birth year] 51 y.o. [place], MD, MD Admit date: 01/21/2012 Chief Complaint:  Chief Complaint  Patient presents with  . Hypoglycemia   HPI:  Pt is a 51 year old male patient with PMhx of DM,s currently a resident of assisted living, was brought to emergency room  due to severe hypoglycemia on 01/21/2012.  His blood sugar was around 40 when he was evaluated in the assisted living. He was previously admitted several  times with similar episodes and urinary tract infection. He was evaluated in the emergency room and was given D50. Further evaluation  also showed that he has bilateral infiltrates on his chest x-ray suggestive of pneumonia. His urinalysis also was abnormal. The patient  was started on IV antibiotics and was admitted for further treatment. Pt was found to be in AKI and Nephrology was consulted.Pt creat trended to baseline and we signed off. Pt clinical course worsened. Pt later had resp distress and transferred to ICU.Pt thought to be in septic shock Pt was hypotensive , on ACE. Pt currently intubate- not able to offers complaints     Past Medical History  Diagnosis Date  . Renal disorder   . Elective mutism   . Autism   . Hyperosmolar (nonketotic) coma   . Hyperglycemia   . Diabetes mellitus   . Hyponatremia   . Mental retardation   . Urinary retention   . BPH (benign prostatic hyperplasia)   . Gastroparesis   . Depression   . Pneumonia   . UTI (urinary tract infection)   . Mental retardation         No family history on file.  Social History:  reports that he has never smoked. He does not have any smokeless tobacco history on file. He reports that he does not drink alcohol or use illicit drugs.   Allergies: No Known Allergies  Medications Prior to Admission  Medication Sig Dispense Refill  . insulin aspart (NOVOLOG) 100 UNIT/ML injection Inject 1-6 Units into the skin 3 (three)  times daily as needed. Per sliding scale instructions: If 150-200= 1 unit, 201-250= 2 units, 251-300= 3 units, 301-350= 4 units, 351-400= 5 units, Greater than 400= 6 units// in addition to Sliding Scale      . insulin glargine (LANTUS) 100 UNIT/ML injection Inject 30 Units into the skin at bedtime.       Marland Kitchen lisinopril (PRINIVIL,ZESTRIL) 10 MG tablet Take 10 mg by mouth every morning.       . polyethylene glycol (MIRALAX / GLYCOLAX) packet Take 17 g by mouth every morning.       . ranitidine (ZANTAC) 150 MG capsule Take 150 mg by mouth 2 (two) times daily.        . sertraline (ZOLOFT) 50 MG tablet Take 150 mg by mouth every morning.       . sulfamethoxazole-trimethoprim (BACTRIM DS) 800-160 MG per tablet Take 1 tablet by mouth every evening.  30 tablet  1  . Tamsulosin HCl (FLOMAX) 0.4 MG CAPS Take 0.4 mg by mouth every morning.       . traZODone (DESYREL) 50 MG tablet Take 50 mg by mouth at bedtime.        . ziprasidone (GEODON) 40 MG capsule Take 40 mg by mouth 2 (two) times daily with a meal.        . acetaminophen (TYLENOL) 325 MG tablet Take 650 mg by mouth every 6 (six) hours as needed. For pain      .  hyoscyamine (LEVSIN SL) 0.125 MG SL tablet Take 0.125-0.25 mg by mouth every 4 (four) hours as needed. Place one or two tablets under tongue every 4 hours if needed for relief of abdominal pain.          ROS- Unable to get      . albuterol  2.5 mg Nebulization Q4H   And  . ipratropium  0.5 mg Nebulization Q4H  . antiseptic oral rinse  15 mL Mouth Rinse QID  . chlorhexidine  15 mL Mouth Rinse BID  . enoxaparin (LOVENOX) injection  30 mg Subcutaneous Q24H  . famotidine  20 mg Oral Daily  . feeding supplement  237 mL Oral BID BM  . fluconazole  100 mg Oral Daily  . furosemide  40 mg Intravenous Once  . hydrocortisone sodium succinate  50 mg Intravenous Q6H  . insulin aspart  20 Units Subcutaneous Once  . insulin regular  0-10 Units Intravenous TID WC  . levofloxacin (LEVAQUIN) IV   750 mg Intravenous Once  . lisinopril  10 mg Oral Daily  . midazolam      . pantoprazole (PROTONIX) IV  40 mg Intravenous Daily  . polyethylene glycol  17 g Oral q morning - 10a  . sertraline  150 mg Oral Daily  . sodium bicarbonate  100 mEq Intravenous Once  . sodium chloride  1,000 mL Intravenous Once  . sodium chloride  500 mL Intravenous Once  . sodium chloride  10-40 mL Intracatheter Q12H  . sodium chloride      . Tamsulosin HCl  0.4 mg Oral Daily  . traZODone  50 mg Oral QHS  . vancomycin  1,000 mg Intravenous Q24H  . ziprasidone  40 mg Oral BID WC  . DISCONTD: albuterol  2.5 mg Nebulization Q6H  . DISCONTD: cefTRIAXone (ROCEPHIN)  IV  1 g Intravenous Q24H  . DISCONTD: enoxaparin (LOVENOX) injection  40 mg Subcutaneous Q24H  . DISCONTD: insulin aspart  0-15 Units Subcutaneous TID WC  . DISCONTD: insulin aspart  15 Units Subcutaneous TID WC  . DISCONTD: insulin glargine  35 Units Subcutaneous QHS  . DISCONTD: insulin glargine  40 Units Subcutaneous QHS  . DISCONTD: ipratropium  0.5 mg Nebulization Q6H  . DISCONTD: vancomycin  1,000 mg Intravenous Q12H         Physical Exam: Vital signs in last 24 hours: Temp:  [97 F (36.1 C)-97.4 F (36.3 C)] 97.4 F (36.3 C) (05/29 1200) Pulse Rate:  [62-96] 92  (05/29 1315) Resp:  [12-29] 24  (05/29 1315) BP: (74-122)/(25-53) 122/53 mmHg (05/29 1315) SpO2:  [85 %-100 %] 100 % (05/29 1315) FiO2 (%):  [50 %-75 %] 70.3 % (05/29 1315) Weight change:  Last BM Date: 01/26/12  Intake/Output from previous day: 05/28 0701 - 05/29 0700 In: 1100 [P.O.:400; IV Piggyback:700] Out: 450 [Urine:450]     Physical Exam: General- pt is intubated HEENT- Pale conjunctiva, ET tube in situ Resp- No acute REsp distress, Breath sounds + b/l CVS- S1S2 regular in rate and rhythm GIT- BS decaresed, soft, NT, ND EXT- NO LE Edema, NO  Cyanosis CNS- unable to assess Psych- unable to assess   Lab Results: CBC  Basename 01/30/12 0551  WBC  28.8*  HGB 7.3*  HCT 22.3*  PLT 327    BMET  Basename 01/30/12 1215 01/30/12 0551  NA 129* 127*  K 4.4 5.1  CL 97 92*  CO2 12* 9*  GLUCOSE 412* 504*  BUN 57* 55*  CREATININE 3.19* 2.85*  CALCIUM 8.7 9.2   Trend  Creat          5/20                                                                          5/26  2013 1.7==>2.35==>2.65==>1.92==>19.2==>1.42==>1.34           5/26        5/29      5/29            1.34==> 2.85==>3.19   2012  1.2--2.2 2011   1.0---2.9   Sodium 2013 129==>134==>128==>127==>129  BIcarb  20==>18==>17==>9==>12  HGb 10.0==>8.8=>8.9==>8.4==>7.3  ABG Ph 7.0 PCO2 35 PO2 75  Expected PCO2 is= 12 times 1.5 + 8=18+8=26 Measured PCO2 higher than expected  Anion Gap 129-97+12=20  ALb 3.8 on 5/20  Delta AG 20-10=10  Delta Bicarb  24-12=12    MICRO Recent Results (from the past 240 hour(s))  URINE CULTURE     Status: Normal   Collection Time   01/21/12  3:36 PM      Component Value Range Status Comment   Specimen Description URINE, CATHETERIZED   Final    Special Requests NONE   Final    Culture  Setup Time [id number]   Final    Colony Count >=100,000 COLONIES/ML   Final    Culture     Final    Value: ENTEROCOCCUS SPECIES     YEAST   Report Status 01/25/2012 FINAL   Final    Organism ID, Bacteria ENTEROCOCCUS SPECIES   Final   MRSA PCR SCREENING     Status: Abnormal   Collection Time   01/22/12  9:35 AM      Component Value Range Status Comment   MRSA by PCR INVALID RESULTS, SPECIMEN SENT FOR CULTURE (*) NEGATIVE  Final   MRSA CULTURE     Status: Normal   Collection Time   01/22/12  9:35 AM      Component Value Range Status Comment   Specimen Description NOSE NASAL SWAB   Final    Special Requests NONE   Final    Culture     Final    Value: NO STAPHYLOCOCCUS AUREUS ISOLATED     Note: NOMRSA   Report Status 01/24/2012 FINAL   Final   CULTURE, BLOOD (ROUTINE X 2)     Status: Normal   Collection Time   01/24/12  4:59 PM        Component Value Range Status Comment   Specimen Description BLOOD RIGHT HAND   Final    Special Requests BOTTLES DRAWN AEROBIC AND ANAEROBIC 8CC   Final    Culture NO GROWTH 5 DAYS   Final    Report Status 01/29/2012 FINAL   Final   CULTURE, BLOOD (ROUTINE X 2)     Status: Normal   Collection Time   01/24/12  5:03 PM      Component Value Range Status Comment   Specimen Description BLOOD LEFT HAND   Final    Special Requests BOTTLES DRAWN AEROBIC AND ANAEROBIC 8CC   Final    Culture NO GROWTH 5 DAYS   Final    Report Status 01/29/2012 FINAL   Final  CLOSTRIDIUM DIFFICILE BY PCR     Status: Normal   Collection Time   01/27/12  9:00 PM      Component Value Range Status Comment   C difficile by pcr NEGATIVE  NEGATIVE  Final       Lab Results  Component Value Date   CALCIUM 8.7 01/30/2012   CAION 1.25 12/04/2011   PHOS 2.6 01/25/2012   Renal U/s 5/22 The right kidney measures 11.2 cm in long axis. The left kidney  measures 10.5 cm. Tiny left renal cyst identified on CT from  09/19/2011 is not readily  On today's ultrasound exam.  A Foley catheter decompresses the urinary bladder.  Impression:  No evidence for hydronephrosis.    Impression: 1)Renal  AKI secondary to Prerenal/ ATN               AKI secondary to Hypotension+ sepsis + ACE               AKI on CKD              AKI most Likely ATN               CKD stage 3 .               CKD since 2011               CKD secondary to HTN/ DM                Progression of CKD -Marked with AKI             2)CVS- Hemodynamically Unstable On Vasopressors  3)Anemia HGb NOT at goal (9--11) Iron deficincey as Iron less than 10 on 5/24 GI work up-as per Primary team    4)ID   Pneumonia          ON ABX     5)DM- Being managed by Primary team  6)Hyponatremia- Secondary to Hypovolemia + SSRI   7)Acid base Co2 NOT at goal HIgh AG acidosis + NON AG acidosis+ REsp Acidosis   8) Prognosis -poor   Plan:   1) will d/c  ace 2) Will d/c SSRI 3) Agree with IVF 4) will change NS + 100 meq bicarb( Hypertonic) to 1/4 NS + 100 meq Bicarb( Isotonic) 5) Agree with transfusion PRN 6) will ask for Urine Na and  Creat 7) will repeat Renal U/s to r/o post renal causes 8) will follow BMet  9) Will follow ABG 10) Will ask for 2d echo to help with EF assessment.  Sharlena Kristensen S 01/30/2012, 2:51 PM

## 2012-01-30 NOTE — Consult Note (Signed)
ANTIBIOTIC CONSULT NOTE   Pharmacy Consult for Vancomycin and Levaquin Indication: septic shock protocol, UTI, pna  No Known Allergies  Patient Measurements: Height: 5\' 10"  (177.8 cm) Weight: 149 lb 14.6 oz (68 kg) IBW/kg (Calculated) : 73   Vital Signs: Temp: 97.4 F (36.3 C) (05/29 0900) Temp src: Oral (05/29 0900) BP: 79/27 mmHg (05/29 0945) Pulse Rate: 72  (05/29 0945) Intake/Output from previous day: 05/28 0701 - 05/29 0700 In: 1100 [P.O.:400; IV Piggyback:700] Out: 450 [Urine:450] Intake/Output from this shift:    Labs:  Basename 01/30/12 0551  WBC 28.8*  HGB 7.3*  PLT 327  LABCREA --  CREATININE 2.85*   Estimated Creatinine Clearance: 29.8 ml/min (by C-G formula based on Cr of 2.85). No results found for this basename: VANCOTROUGH:2,VANCOPEAK:2,VANCORANDOM:2,GENTTROUGH:2,GENTPEAK:2,GENTRANDOM:2,TOBRATROUGH:2,TOBRAPEAK:2,TOBRARND:2,AMIKACINPEAK:2,AMIKACINTROU:2,AMIKACIN:2, in the last 72 hours  Microbiology: Recent Results (from the past 720 hour(s))  URINE CULTURE     Status: Normal   Collection Time   01/11/12 10:49 PM      Component Value Range Status Comment   Specimen Description URINE, CATHETERIZED   Final    Special Requests NONE   Final    Culture  Setup Time [id number]   Final    Colony Count >=100,000 COLONIES/ML   Final    Culture     Final    Value: Multiple bacterial morphotypes present, none predominant. Suggest appropriate recollection if clinically indicated.   Report Status 01/13/2012 FINAL   Final   URINE CULTURE     Status: Normal   Collection Time   01/21/12  3:36 PM      Component Value Range Status Comment   Specimen Description URINE, CATHETERIZED   Final    Special Requests NONE   Final    Culture  Setup Time [id number]   Final    Colony Count >=100,000 COLONIES/ML   Final    Culture     Final    Value: ENTEROCOCCUS SPECIES     YEAST   Report Status 01/25/2012 FINAL   Final    Organism ID, Bacteria ENTEROCOCCUS SPECIES    Final   MRSA PCR SCREENING     Status: Abnormal   Collection Time   01/22/12  9:35 AM      Component Value Range Status Comment   MRSA by PCR INVALID RESULTS, SPECIMEN SENT FOR CULTURE (*) NEGATIVE  Final   MRSA CULTURE     Status: Normal   Collection Time   01/22/12  9:35 AM      Component Value Range Status Comment   Specimen Description NOSE NASAL SWAB   Final    Special Requests NONE   Final    Culture     Final    Value: NO STAPHYLOCOCCUS AUREUS ISOLATED     Note: NOMRSA   Report Status 01/24/2012 FINAL   Final   CULTURE, BLOOD (ROUTINE X 2)     Status: Normal   Collection Time   01/24/12  4:59 PM      Component Value Range Status Comment   Specimen Description BLOOD RIGHT HAND   Final    Special Requests BOTTLES DRAWN AEROBIC AND ANAEROBIC 8CC   Final    Culture NO GROWTH 5 DAYS   Final    Report Status 01/29/2012 FINAL   Final   CULTURE, BLOOD (ROUTINE X 2)     Status: Normal   Collection Time   01/24/12  5:03 PM      Component Value Range Status Comment   Specimen Description  BLOOD LEFT HAND   Final    Special Requests BOTTLES DRAWN AEROBIC AND ANAEROBIC 8CC   Final    Culture NO GROWTH 5 DAYS   Final    Report Status 01/29/2012 FINAL   Final   CLOSTRIDIUM DIFFICILE BY PCR     Status: Normal   Collection Time   01/27/12  9:00 PM      Component Value Range Status Comment   C difficile by pcr NEGATIVE  NEGATIVE  Final    Medical History: Past Medical History  Diagnosis Date  . Renal disorder   . Elective mutism   . Autism   . Hyperosmolar (nonketotic) coma   . Hyperglycemia   . Diabetes mellitus   . Hyponatremia   . Mental retardation   . Urinary retention   . BPH (benign prostatic hyperplasia)   . Gastroparesis   . Depression   . Pneumonia   . UTI (urinary tract infection)   . Mental retardation    Medications:  Scheduled:     . albuterol  2.5 mg Nebulization Q4H   And  . ipratropium  0.5 mg Nebulization Q4H  . antiseptic oral rinse  15 mL Mouth Rinse  QID  . chlorhexidine  15 mL Mouth Rinse BID  . enoxaparin (LOVENOX) injection  40 mg Subcutaneous Q24H  . etomidate      . famotidine  20 mg Oral Daily  . feeding supplement  237 mL Oral BID BM  . fluconazole  100 mg Oral Daily  . furosemide  40 mg Intravenous Once  . hydrocortisone sodium succinate  50 mg Intravenous Q6H  . insulin aspart  20 Units Subcutaneous Once  . insulin regular  0-10 Units Intravenous TID WC  . levofloxacin (LEVAQUIN) IV  750 mg Intravenous Once  . lidocaine (cardiac) 100 mg/30ml      . lisinopril  10 mg Oral Daily  . midazolam      . pantoprazole (PROTONIX) IV  40 mg Intravenous Daily  . polyethylene glycol  17 g Oral q morning - 10a  . rocuronium      . sertraline  150 mg Oral Daily  . sodium chloride  1,000 mL Intravenous Once  . sodium chloride  500 mL Intravenous Once  . sodium chloride  10-40 mL Intracatheter Q12H  . succinylcholine      . Tamsulosin HCl  0.4 mg Oral Daily  . traZODone  50 mg Oral QHS  . vancomycin  1,000 mg Intravenous Q24H  . ziprasidone  40 mg Oral BID WC  . DISCONTD: albuterol  2.5 mg Nebulization Q6H  . DISCONTD: cefTRIAXone (ROCEPHIN)  IV  1 g Intravenous Q24H  . DISCONTD: insulin aspart  0-15 Units Subcutaneous TID WC  . DISCONTD: insulin aspart  15 Units Subcutaneous TID WC  . DISCONTD: insulin glargine  35 Units Subcutaneous QHS  . DISCONTD: insulin glargine  40 Units Subcutaneous QHS  . DISCONTD: ipratropium  0.5 mg Nebulization Q6H  . DISCONTD: vancomycin  1,000 mg Intravenous Q12H   Assessment: Pt currently on day #6 Vancomycin and day#5 diflucan for CAP and enterococcal UTI.  Pt now with respiratory distress, transferred to ICU, to add Levaquin day #1.  Pt has worsening renal fxn, acute on chronic renal failure, meds adjusted, ? Vancomycin induced or disease related.  Levaquin, Diflucan, and Geodon can cause QT prolongation. Vancomycin trough was at goal range for UTI on 01/26/12.   Goal of Therapy:  Vancomycin trough  level 15-20 for pna,  aggressive Rx Monitor renal fxn daily Eradicate infection.  Plan: 1) Continue Vancomycin 1gm iv q24hrs (renally adjusted) 2) Check trough level tonight to assess clearance with worsening renal fxn 3) Levaquin 750mg  iv now x 1 dose then 500mg  q48hours 4)  Monitor SCr dailiy for now  Hart Robinsons A 01/30/2012,10:24 AM

## 2012-01-30 NOTE — Progress Notes (Signed)
Discussed plan of care with Dr. Legrand Rams and Karie Kirks in regards to patient care.  Dr. Legrand Rams was notified of patients CBG 513 and orders were given for  Transfer to ICU and for the [place].    Notified Dr. Dorris Fetch of the situation and of the patients critical CBG and ABG.  Recommendations per RT was given to add sodium bicarbs to his IVF.  MD gave new orders.    Report called to Charleston Poot, RN of the ICU.  Pt transferred by staff to ICU bed.

## 2012-01-30 NOTE — Progress Notes (Signed)
Spoke with Dr. Luan Pulling about the necessity of arterial line. Decided not to place Arterial line. Order not in order manager but A line is part of sepsis protocol.

## 2012-01-30 NOTE — Progress Notes (Signed)
Patient ID: [name], male   DOB: March 10, 1961, 51 y.o.   MRN: [id number]

## 2012-01-30 NOTE — Progress Notes (Signed)
NAME:  [name], [name]           ACCOUNT NO.:  [id number]  MEDICAL RECORD NO.:  [id number]  LOCATION:  [id number]                          FACILITY:  [facility]  PHYSICIAN:  Esabella Stockinger D. Legrand Rams, MD   DATE OF BIRTH:  08-30-1961  DATE OF PROCEDURE:  01/29/2012 DATE OF DISCHARGE:                                PROGRESS NOTE   SUBJECTIVE:  The patient had episode of respiratory distress during the night.  His breathing was not labored.  ABG was done and it showed severe hypoxemia with acidosis.  Repeat chest x-ray also showed increased opacities throughout the lungs and small pleural effusion. The patient is currently started on BiPAP and he is going to be transferred to ICU.  OBJECTIVE:  GENERAL:  The patient is in respiratory distress and acutely sick looking. VITAL SIGNS:  Blood pressure 102/60, pulse 92, respiratory rate 16, temperature 97 degrees Fahrenheit. CHEST:  Poor air entry, bilateral rhonchi. CARDIOVASCULAR SYSTEM:  First and second heart sounds heard.  No murmur. No gallop. ABDOMEN:  Soft and lax.  Bowel sound is positive.  No mass or organomegaly.  EXTREMITIES:  No leg edema.  LABS:  CBC:  WBC 9.8, hemoglobin 7.3, hematocrit 22.3, and platelets 327,000.  BMP:  Sodium 127, potassium 3.1, chloride 92, carbon dioxide 9, glucose 504, BUN 55, creatinine 2.85.  ASSESSMENT: 1. Respiratory failure secondary to bilateral pneumonia. 2. Worsening bilateral pneumonia. 3. Acute on chronic renal failure. 4. Metabolic acidosis. 5. Poorly controlled diabetes mellitus.  PLAN:  We will start the patient on IV fluid with normal saline at 150 mL per hour.  We will transfer him to ICU.  We will do pulmonary and endocrine consult.  Continue supportive care.     Troyce Febo D. Legrand Rams, MD     TDF/MEDQ  D:  01/30/2012  T:  01/30/2012  Job:  [id number]

## 2012-01-30 NOTE — Consult Note (Signed)
NAMETARREL, SWARM           ACCOUNT NO.:  [id number]  MEDICAL RECORD NO.:  [id number]  LOCATION:  IC03                          FACILITY:  [facility]  PHYSICIAN:  Dantonio Justen L. Luan Kennedy, KennedyDATE OF BIRTH:  [birth year]  DATE OF CONSULTATION: DATE OF DISCHARGE:                                CONSULTATION   [name] is a 51 year old, African American male, who is admitted to the hospital several days ago with hypoglycemia.  He has been in an assisted living center.  He has a history of multiple medical problems including pneumonia, diabetes, mental retardation, UTI, which is recurrent, BPH, acute renal failure, hyperkalemia, anemia, and chronic indwelling Foley catheter.  He was being treated and was slowly improving, when he developed increasing shortness of breath, cough, and congestion, was found to have what appeared to be bilateral pneumonia. He was found to be in respiratory failure, and eventually transferred to the intensive care unit.  When I saw him in the intensive care unit, he was unresponsive and had labored respirations and was intubated after receiving 4 mg of Versed intravenously.  He has also developed markedly increased blood sugar.  X-ray shows what appears to be bilateral pneumonia, perhaps from aspiration.  His past medical history is positive for multiple problems as mentioned above.  At home, he has been on Tylenol, less than 0.125 mg sublingual as needed, NovoLog insulin by sliding scale, Lantus insulin 30 units at bedtime, lisinopril 10 mg daily, MiraLAX 17 g daily, ranitidine 150 mg daily, Zoloft 150 mg daily, Bactrim DS 1 daily, Flomax 0.4 mg daily, trazodone 50 mg at bedtime, Geodon 40 mg 2 tablets at bedtime.  SOCIAL HISTORY:  He lives in an assisted living center.  He does not use alcohol, tobacco, or illicit substances.  FAMILY HISTORY:  Unknown.  REVIEW OF SYSTEMS:  Not obtainable.  PHYSICAL EXAMINATION:  GENERAL:  Shows that he is poorly  responsive.  He is on BiPAP.  Pupils are reactive. CHEST:  Clear.  Mucous membranes are dry. NECK:  Supple without masses. CHEST:  Shows decreased breath sounds and rhonchi bilaterally.  HEART: Regular with a tachycardia. ABDOMEN:  Soft without masses. EXTREMITIES:  Showed no edema. CENTRAL NERVOUS SYSTEM:  Grossly intact, except for his responsiveness.  He has markedly elevated white blood cell count.  His pH is very low at about 7.03.  Part of this is metabolic, part of this is respiratory.  My assessment then is that he has multisystem failure with acute on chronic renal failure, bilateral pneumonia, and he is in what appears to be sepsis with an elevated lactate.  My plan then is to increase, is to have him get a sepsis protocol started.  He is going to be on multiple antibiotics.  He is on insulin drip per Dr. Dorris Fetch and he is going to need his central venous line.  I do not think there is anything else to add at this point.  PROCEDURE NOTE:  [name] was clearly in respiratory distress and was going to require intubation.  I gave him 4 mg of Versed intravenously and then intubated him with a #8 endotracheal tube without difficulty.  He had bilateral breath sounds, had good color  change on the CO2 indicator, and I will have chest x-ray done.     [name], M.D.     ELH/MEDQ  D:  01/30/2012  T:  01/30/2012  Job:  [id number]

## 2012-01-30 NOTE — Progress Notes (Signed)
NAME:  [name], [name]           ACCOUNT NO.:  [id number]  MEDICAL RECORD NO.:  [id number]  LOCATION:  IC03                          FACILITY:  [facility]  PHYSICIAN:  Loni Beckwith, MD DATE OF BIRTH:  11/28/1960  DATE OF PROCEDURE:  01/30/2012 DATE OF DISCHARGE:                                PROGRESS NOTE   REASON FOR FOLLOWUP:  Uncontrolled type 1 diabetes, admitted with a complaint of hypoglycemia.  SUBJECTIVE:  Overnight events noted.  The patient had hyperglycemia in the range of 500, for which insulin IV infusion was ordered.  However, the patient also has respiratory distress from bilateral pneumonia for which he had been on BiPAP.  Currently, in ICU after he failed respiratory function on the floor, currently on mechanical ventilation. Glycemic control has been achieved to lower 200 on glucose stabilizer utilizing IV insulin infusion.  The patient's kidney function was worsening and urine output was minimal.  Currently, on nephrology evaluation as well.  OBJECTIVE:  VITAL SIGNS:  Blood pressure currently is 115/50, pulse rate 91, temperature 97.6. GENERAL: The patient is acutely sick looking. Patient in ICU bed on pressors and mechanical ventilation. CHEST:  Poor air entry and bilateral rhonchi.  CARDIOVASCULAR:  No murmur, no gallop. ABDOMEN:  Soft, however bowel sounds are distant.  EXTREMITIES:  No edema.  LABS:  Initial bicarb was 9 mEq/L with hyperglycemia of 504.  Repeat chemistry shows bicarb improved to 12 and glucose level of 224.  ASSESSMENT: 1. Acute respiratory failure secondary to bilateral pneumonia. 2. Diabetic ketoacidosis. 3. Acute kidney injury secondary to prerenal azotemia. 4. Metabolic acidosis, non-anion gap.  PLAN:  We will continue.  Insulin infusion to achieve glycemic control, continue bicarb infusion.  The patient has very poor prognosis due to multiple organ failures including respiratory function, renal function, and metabolic  acidosis, derangements.  Once glucose level reaches 100- 200, we will need dextrose 5% and normal saline at least 50 mL per hour. We will re-evaluate after at least 12 hours of IV insulin treatment to stabilize glycemia.          ______________________________ Loni Beckwith, MD     GN/MEDQ  D:  01/30/2012  T:  01/30/2012  Job:  [id number]

## 2012-01-30 NOTE — Progress Notes (Signed)
CRITICAL VALUE ALERT  Critical value received: CBG 513  Date of notification:  01/30/12  Time of notification:  0730  Critical value read back:yes  Nurse who received alert:Altheia Shafran Nuala Alpha  MD notified (1st page):Fanta  Time of first page:  0740  MD notified (2nd page):  Time of second page:  Responding MD:  Dr. Legrand Rams  Time MD responded:  [phone]

## 2012-01-30 NOTE — Clinical Social Work Note (Signed)
Pt moving to ICU today.  CSW will update [facility] as needed and continue to follow.    [name], [place]

## 2012-01-30 NOTE — Progress Notes (Signed)
Nursing Staff has noticed that he has difficulty breathing early morning and late evenings. And suggest sleep study and bipap at bedtime. Please evaluate and treat.

## 2012-01-30 NOTE — Clinical Social Work Note (Signed)
Pt now on vent.  Per Reggie, guardianship process was initiated.  However, [name] at [facility] is unaware of this.  CSW spoke with [facility] and faxed information over ProviderLink for review.  CSW to continue to follow and assist.   [name], [place]

## 2012-01-31 ENCOUNTER — Inpatient Hospital Stay (HOSPITAL_COMMUNITY): Payer: Medicaid Other

## 2012-01-31 DIAGNOSIS — R0989 Other specified symptoms and signs involving the circulatory and respiratory systems: Secondary | ICD-10-CM

## 2012-01-31 DIAGNOSIS — R0609 Other forms of dyspnea: Secondary | ICD-10-CM

## 2012-01-31 LAB — GLUCOSE, CAPILLARY
Glucose-Capillary: 117 mg/dL — ABNORMAL HIGH (ref 70–99)
Glucose-Capillary: 135 mg/dL — ABNORMAL HIGH (ref 70–99)
Glucose-Capillary: 137 mg/dL — ABNORMAL HIGH (ref 70–99)
Glucose-Capillary: 159 mg/dL — ABNORMAL HIGH (ref 70–99)
Glucose-Capillary: 176 mg/dL — ABNORMAL HIGH (ref 70–99)
Glucose-Capillary: 178 mg/dL — ABNORMAL HIGH (ref 70–99)
Glucose-Capillary: 193 mg/dL — ABNORMAL HIGH (ref 70–99)
Glucose-Capillary: 197 mg/dL — ABNORMAL HIGH (ref 70–99)
Glucose-Capillary: 364 mg/dL — ABNORMAL HIGH (ref 70–99)
Glucose-Capillary: 98 mg/dL (ref 70–99)

## 2012-01-31 LAB — BLOOD GAS, ARTERIAL
Acid-base deficit: 6.8 mmol/L — ABNORMAL HIGH (ref 0.0–2.0)
Bicarbonate: 18.3 mEq/L — ABNORMAL LOW (ref 20.0–24.0)
FIO2: 45 %
MECHVT: 500 mL
O2 Content: 45 L/min
O2 Saturation: 96.2 %
Patient temperature: 37
TCO2: 17.4 mmol/L (ref 0–100)

## 2012-01-31 LAB — TYPE AND SCREEN
ABO/RH(D): A POS
Antibody Screen: NEGATIVE
Unit division: 0

## 2012-01-31 LAB — BASIC METABOLIC PANEL
BUN: 58 mg/dL — ABNORMAL HIGH (ref 6–23)
CO2: 19 mEq/L (ref 19–32)
Calcium: 8.7 mg/dL (ref 8.4–10.5)
Calcium: 8.8 mg/dL (ref 8.4–10.5)
Chloride: 98 mEq/L (ref 96–112)
Chloride: 100 mEq/L (ref 96–112)
Creatinine, Ser: 2.89 mg/dL — ABNORMAL HIGH (ref 0.50–1.35)
Creatinine, Ser: 2.82 mg/dL — ABNORMAL HIGH (ref 0.50–1.35)
GFR calc Af Amer: 28 mL/min — ABNORMAL LOW (ref >90)
GFR calc Af Amer: 29 mL/min — ABNORMAL LOW (ref >90)
GFR calc non Af Amer: 24 mL/min — ABNORMAL LOW (ref >90)
Glucose, Bld: 263 mg/dL — ABNORMAL HIGH (ref 70–99)
Glucose, Bld: 198 mg/dL — ABNORMAL HIGH (ref 70–99)
Potassium: 3.5 mEq/L (ref 3.5–5.1)
Sodium: 132 mEq/L — ABNORMAL LOW (ref 135–145)
Sodium: 134 mEq/L — ABNORMAL LOW (ref 135–145)
Sodium: 134 mEq/L — ABNORMAL LOW (ref 135–145)

## 2012-01-31 LAB — CBC
Hemoglobin: 8.6 g/dL — ABNORMAL LOW (ref 13.0–17.0)
Platelets: 265 10*3/uL (ref 150–400)
RBC: 3.1 MIL/uL — ABNORMAL LOW (ref 4.22–5.81)
WBC: 24.6 10*3/uL — ABNORMAL HIGH (ref 4.0–10.5)

## 2012-01-31 MED ORDER — FENTANYL CITRATE 0.05 MG/ML IJ SOLN
50.0000 ug | INTRAMUSCULAR | Status: DC | PRN
  Administered 2012-01-31: 50 ug via INTRAVENOUS
  Administered 2012-01-31 – 2012-02-05 (×7): 100 ug via INTRAVENOUS
  Filled 2012-01-31 (×8): qty 2

## 2012-01-31 MED ORDER — LEVOFLOXACIN IN D5W 500 MG/100ML IV SOLN
500.0000 mg | INTRAVENOUS | Status: DC
  Filled 2012-01-31 (×2): qty 100

## 2012-01-31 MED ORDER — GLUCERNA 1.2 CAL PO LIQD
1000.0000 mL | ORAL | Status: DC
  Administered 2012-01-31 – 2012-02-04 (×5): 1000 mL
  Filled 2012-01-31 (×8): qty 1000

## 2012-01-31 MED ORDER — POTASSIUM CHLORIDE 10 MEQ/100ML IV SOLN
10.0000 meq | INTRAVENOUS | Status: AC
  Administered 2012-01-31 (×4): 10 meq via INTRAVENOUS
  Filled 2012-01-31: qty 400

## 2012-01-31 MED ORDER — MIDAZOLAM HCL 2 MG/2ML IJ SOLN
2.0000 mg | Freq: Once | INTRAMUSCULAR | Status: DC
  Filled 2012-01-31: qty 2

## 2012-01-31 MED ORDER — MIDAZOLAM HCL 2 MG/2ML IJ SOLN
2.0000 mg | INTRAMUSCULAR | Status: DC | PRN
  Administered 2012-01-31 – 2012-02-01 (×6): 2 mg via INTRAVENOUS
  Administered 2012-02-05: 4 mg via INTRAVENOUS
  Filled 2012-01-31 (×4): qty 2
  Filled 2012-01-31: qty 4

## 2012-01-31 MED ORDER — INSULIN ASPART 100 UNIT/ML ~~LOC~~ SOLN
0.0000 [IU] | SUBCUTANEOUS | Status: DC
  Administered 2012-01-31: 0 [IU] via SUBCUTANEOUS
  Administered 2012-01-31: 5 [IU] via SUBCUTANEOUS
  Administered 2012-01-31: 11 [IU] via SUBCUTANEOUS

## 2012-01-31 MED ORDER — FUROSEMIDE 10 MG/ML IJ SOLN
60.0000 mg | Freq: Two times a day (BID) | INTRAMUSCULAR | Status: DC
  Administered 2012-01-31 – 2012-02-02 (×6): 60 mg via INTRAVENOUS
  Filled 2012-01-31 (×7): qty 6

## 2012-01-31 NOTE — Clinical Social Work Note (Signed)
Spoke with [name], Med Tech at [facility], to gather information needed to file for PASARR for patient.  Redmond Pulling stated patient was admitted to [facility] on 05-05-2010.  His parents were both killed in a car wreck some time ago, unclear when.  Per facility records, patient has been diagnosed with moderate mental retardation, autism and elective mutism.  Redmond Pulling could not say who had diagnosed patient with these conditions.  Redmond Pulling says that she has "neer heard the patient speak" but "every now and then he says 'yeah.'"  He uses a wheelchair to ambulate, transferring himself in and out of bed and wheeling himself around the facility during the day.  He is not out of the wheelchair.  She states that he has significant limitations in function and cannot care for himself independently.  She states that his sugars tend to "bottom out" although his appetite is good.  PASARR submitted with this information included on 01-31-12.  Justice Britain Clinical Social Worker [phone])

## 2012-01-31 NOTE — Progress Notes (Signed)
Inpatient Diabetes Program Recommendations  AACE/ADA: New Consensus Statement on Inpatient Glycemic Control (2009)  Target Ranges:  Prepandial:   less than 140 mg/dL      Peak postprandial:   less than 180 mg/dL (1-2 hours)      Critically ill patients:  140 - 180 mg/dL   Reason for Visit: Patient on IV insulin 01/30/12 for DKA/hyperglycemia.  It appears he was weaned off insulin drip during the night and transitioned to SQ Novolog insulin.   CBG now up to 327 mg/dL.  Will need BASAL insulin ASAP or possibly restart of IV insulin. Consider calling Dr. Dorris Fetch to address insulin regimen for today.  To start tube feeds today.  Called RN to discuss.

## 2012-01-31 NOTE — Progress Notes (Signed)
Subjective: Interval History: none.  Objective: Vital signs in last 24 hours: Temp:  [97.2 F (36.2 C)-97.6 F (36.4 C)] 97.4 F (36.3 C) (05/30 0400) Pulse Rate:  [62-100] 100  (05/30 0600) Resp:  [12-29] 24  (05/30 0600) BP: (74-143)/(25-101) 141/101 mmHg (05/30 0600) SpO2:  [85 %-100 %] 95 % (05/30 0600) FiO2 (%):  [44.7 %-75 %] 44.8 % (05/30 0600) Weight change:   Intake/Output from previous day: 05/29 0701 - 05/30 0700 In: 6233.9 [I.V.:3711.9; Blood:312; IV Piggyback:2210] Out: 650 [Urine:500; Emesis/NG output:150] Intake/Output this shift:   patient is intubated he is awake and alert seems to be somewhat restless. Chest he is decreased breath sound and also some inspiratory crackles Heart exam regular rate and rhythm Abdomen soft positive bowel sound  Extremities no edema.  Lab Results:  Basename 01/31/12 0411 01/30/12 0551  WBC 24.6* 28.8*  HGB 8.6* 7.3*  HCT 25.0* 22.3*  PLT 265 327   BMET:  Basename 01/31/12 0411 01/30/12 2158  NA 132* 133*  K 3.8 3.4*  CL 98 101  CO2 19 21  GLUCOSE 263* 136*  BUN 53* 53*  CREATININE 2.89* 2.93*  CALCIUM 8.7 8.7   No results found for this basename: PTH:2 in the last 72 hours Iron Studies: No results found for this basename: IRON,TIBC,TRANSFERRIN,FERRITIN in the last 72 hours  Studies/Results: US Renal  01/30/2012  *RADIOLOGY REPORT*  Clinical Data: 51 year old male with renal failure.  History of diabetes.  RENAL/URINARY TRACT ULTRASOUND COMPLETE  Comparison:  01/23/2012.  CT abdomen and pelvis 09/19/2011 and earlier.  Findings:  Right Kidney:  No hydronephrosis.  Renal length 10.3 cm (previously 11.2 cm).  Corticomedullary differentiation is preserved, but cortical echotexture appears increased compared to the liver. Trace perinephric fluid.  Left Kidney:  No hydronephrosis.  Renal length 10.8 cm (previously 10.5 cm).  Corticomedullary differentiation is preserved the renal cortical echotexture is increased.  Trace  perinephric fluid.  Bladder:  Decompressed, Foley catheter reportedly in place.  IMPRESSION: No obstructive uropathy.  Trace bilateral perinephric fluid is nonspecific.  Bilateral renal cortical echotexture appears increased suggesting sequelae of medical renal disease.  Original Report Authenticated By: Randall An, M.D.   Dg Chest Port 1 View  01/30/2012  *RADIOLOGY REPORT*  Clinical Data: Central line placement.  PORTABLE CHEST - 1 VIEW  Comparison: 01/30/2012 at 0937 hours.  Findings: Interval placement of a left subclavian central line with tip projecting over the SVC.  No pneumothorax.  Remainder of support apparatus is stable in position.  Severe diffuse bilateral air space disease is unchanged.  Tiny right pleural effusion.  IMPRESSION:  1.  Left subclavian central line placement without pneumothorax. 2.  Severe diffuse bilateral air space disease and tiny right pleural effusion.  Original Report Authenticated By: Luretha Rued, M.D.   Dg Chest Port 1 View  01/30/2012  *RADIOLOGY REPORT*  Clinical Data: Endotracheal tube placement.  PORTABLE CHEST - 1 VIEW  Comparison: 01/30/2012 at 0425 hours.  Findings: Endotracheal tube is in satisfactory position. Nasogastric tube is followed into the stomach.  Heart size normal. There is severe diffuse bilateral air space disease, with possible slight interval improvement, especially in the right upper lobe. Tiny right pleural effusion.  IMPRESSION:  1.  Endotracheal tube is in satisfactory position. 2.  There is severe diffuse bilateral air space disease, with minimal interval improvement in aeration, especially in the right upper lobe. 3.  Tiny right pleural effusion.  Original Report Authenticated By: Luretha Rued, M.D.  Dg Chest Port 1 View  01/30/2012  *RADIOLOGY REPORT*  Clinical Data: The increase shortness of breath.  PORTABLE CHEST - 1 VIEW  Comparison: 01/29/2012  Findings: Diffuse air space disease throughout both lungs with increased  density since the previous study.  Changes might represent pneumonia, edema, or ARDS.  Small right pleural effusion is unchanged.  No pneumothorax.  Heart size and pulmonary vascularity are obscured by the parenchymal process.  IMPRESSION: Increasing diffuse airspace disease throughout the lungs.  Small right pleural effusion.  Original Report Authenticated By: Neale Burly, M.D.   Dg Chest Port 1 View  01/29/2012  *RADIOLOGY REPORT*  Clinical Data: Pneumonia.  PORTABLE CHEST - 1 VIEW  Comparison: Chest x-ray 01/24/2012.  Findings: Compared to the recent prior radiograph, there has been significant worsening aeration in the lungs bilaterally, with multifocal interstitial and patchy airspace disease scattered throughout all aspects of the lungs on today's examination.  Slight improvement in aeration is noted relatively speaking within the right upper lobe.  Blunting of the right costophrenic sulcus could suggest a small right-sided pleural effusion (unchanged). Assessment of pulmonary vasculature is not possible given the overlying interstitial and airspace opacities on today's examination.  Heart size is normal.  Mediastinal contours are unremarkable.  IMPRESSION: 1.  Significantly worsened aeration throughout the lungs bilaterally, compatible with worsening multilobar pneumonia. 2.  Small right-sided pleural effusion.  Original Report Authenticated By: Etheleen Mayhew, M.D.    I have reviewed the patient's current medications.  Assessment/Plan: Problem #1 acute kidney injury presently his pending creatinine seems to be improving. The etiology for his renal failure could be ATN. Problem #2 respiratory failure patient presently intubated Problem #3 history of diabetes patient blood sugar seems to be fluctuating. Problem #4 history of mental retardation Problem #5 history of BPH with chronic Foley catheter Problem #6 history of hyponatremia sodium is improving Problem #7 history of  anemia Problem #8 history of her sepsis most likely secondary to aspiration pneumonia. Plan: We'll continue his present management Will follow his basic metabolic panel.  We'll start him on Lasix 60 mg IV twice a day to improve his urine output.   LOS: 10 days   Lenell Lama S 01/31/2012,7:25 AM

## 2012-01-31 NOTE — Progress Notes (Signed)
Nutrition Follow-up  Diet Order: NPO   Pt now intubated, sedated with OG tube placed for the initiation of EN. Off pressors per MD note. Uncontrolled Type 1 diabetes.   Meds: Scheduled Meds:   . albuterol  2.5 mg Nebulization Q4H   And  . ipratropium  0.5 mg Nebulization Q4H  . antiseptic oral rinse  15 mL Mouth Rinse QID  . chlorhexidine  15 mL Mouth Rinse BID  . enoxaparin (LOVENOX) injection  30 mg Subcutaneous Q24H  . famotidine  20 mg Oral Daily  . feeding supplement  237 mL Oral BID BM  . fluconazole  100 mg Oral Daily  . furosemide  60 mg Intravenous BID  . hydrocortisone sodium succinate  50 mg Intravenous Q6H  . insulin regular  0-10 Units Intravenous TID WC  . levofloxacin (LEVAQUIN) IV  500 mg Intravenous Q48H  . midazolam      . midazolam  2-4 mg Intravenous Once  . pantoprazole (PROTONIX) IV  40 mg Intravenous Daily  . polyethylene glycol  17 g Oral q morning - 10a  . sodium chloride  10-40 mL Intracatheter Q12H  . sodium chloride      . Tamsulosin HCl  0.4 mg Oral Daily  . traZODone  50 mg Oral QHS  . ziprasidone  40 mg Oral BID WC  . DISCONTD: insulin aspart  0-15 Units Subcutaneous Q4H  . DISCONTD: lisinopril  10 mg Oral Daily  . DISCONTD: sertraline  150 mg Oral Daily  . DISCONTD: vancomycin  1,000 mg Intravenous Q24H   Continuous Infusions:   . dextrose 5 % and 0.45% NaCl 50 mL/hr at 01/30/12 2000  . feeding supplement (GLUCERNA 1.2 CAL) 1,000 mL (01/31/12 1042)  . insulin (NOVOLIN-R) infusion 21.3 Units/hr (01/31/12 1041)  .  sodium bicarbonate infusion 1/4 NS 1000 mL 100 mL/hr at 01/31/12 0600  . DISCONTD: sodium chloride Stopped (01/30/12 1711)  . DISCONTD:  sodium bicarbonate infusion 1000 mL Stopped (01/30/12 1627)   PRN Meds:.acetaminophen, ALPRAZolam, alum & mag hydroxide-simeth, dextrose, DOBUTamine, fentaNYL, hyoscyamine, midazolam, midazolam, morphine injection, norepinephrine (LEVOPHED) Adult infusion, ondansetron, sodium chloride, sodium  chloride  Labs:  CMP     Component Value Date/Time   NA 134* 01/31/2012 1144   K 3.0* 01/31/2012 1144   CL 98 01/31/2012 1144   CO2 19 01/31/2012 1144   GLUCOSE 261* 01/31/2012 1144   BUN 55* 01/31/2012 1144   CREATININE 2.82* 01/31/2012 1144   CALCIUM 8.8 01/31/2012 1144   PROT 7.8 01/21/2012 1154   ALBUMIN 3.8 01/21/2012 1154   AST 28 01/21/2012 1154   ALT 29 01/21/2012 1154   ALKPHOS 74 01/21/2012 1154   BILITOT 0.3 01/21/2012 1154   GFRNONAA 25* 01/31/2012 1144   GFRAA 28* 01/31/2012 1144     Intake/Output Summary (Last 24 hours) at 01/31/12 1338 Last data filed at 01/31/12 1026  Gross per 24 hour  Intake 5290.1 ml  Output   1100 ml  Net 4190.1 ml    Weight Status: 149.15# (68 kg) on 01/21/12 -Rec daily wt monitoring   Re-estimated needs:   1895-2085 kcal per day 102-115 gr protein daily 500 ml plus urine output  Nutrition Dx: Inadequate oral intake continues NOW r/t inability to eat AEB mechanical ventilation, NPO status  Goal: Pt will be free from signs or symptoms of refeeding.  Intervention: Agree with Glucerna 1.2. Recommend start TF slowly and advance slowly. Initiate continuous Glucerna 1.2 via OGT @ 20 ml/hr advance 10 ml q 8hr to  initial goal of 40 ml/hr. Add ProStat 30 ml TID.    Glucerna @ 40 ml/hr provides: 1152 kcal, 58 gr protein, 773 ml water  ProStat 30 ml TID provides: 45 gr Protein, 216 kcal daily   Nutrition support at goal rate supplies:1368 kcal (20 kcal/kg), 103 gr protein (1.5 gr protein/kg) daily   Initiate Adult EN protocol  Monitor: TF tolerance, vent status, labs and wt changes   Frederik Schmidt Pager [phone]

## 2012-01-31 NOTE — Clinical Social Work Note (Signed)
Phone call w Lucius Conn [phone]) to follow up on guardianship issue.  Per [place]a [place] DSS has not received any paperwork from [facility] about pursuing guardianship.  Patient has a sister in [place] custody in [place] (living at [place]).  Record states sister's parents are deceased.  Frutoso Chase states patient's Medicaid originates in [facility], so patient may have been [facility] resident at one time.  Frutoso Chase is contacting Martin in [facility] for more information.  She is sending someone from [place] out to the ICU this afternoon to see the patient.   Justice Britain Clinical Social Worker [phone])

## 2012-01-31 NOTE — Progress Notes (Signed)
Subjective: He remains intubated on the ventilator. He is poorly responsive. His blood gas has improved and he has less acidemia.  Objective: Vital signs in last 24 hours: Temp:  [97.2 F (36.2 C)-97.6 F (36.4 C)] 97.4 F (36.3 C) (05/30 0400) Pulse Rate:  [62-100] 100  (05/30 0600) Resp:  [12-27] 24  (05/30 0600) BP: (74-143)/(25-101) 141/101 mmHg (05/30 0600) SpO2:  [85 %-100 %] 95 % (05/30 0600) FiO2 (%):  [44.7 %-70.4 %] 44.8 % (05/30 0600) Weight change:  Last BM Date: 01/26/12  Intake/Output from previous day: 05/29 0701 - 05/30 0700 In: 6233.9 [I.V.:3711.9; Blood:312; IV [id number] Out: 650 [Urine:500; Emesis/NG output:150]  PHYSICAL EXAM General appearance: He is poorly responsive intubated on the ventilator. Resp: rhonchi bilaterally Cardio: regular rate and rhythm, S1, S2 normal, no murmur, click, rub or gallop GI: soft, non-tender; bowel sounds normal; no masses,  no organomegaly Extremities: extremities normal, atraumatic, no cyanosis or edema  Lab Results:    Basic Metabolic Panel:  Basename 01/31/12 0411 01/30/12 2158  NA 132* 133*  K 3.8 3.4*  CL 98 101  CO2 19 21  GLUCOSE 263* 136*  BUN 53* 53*  CREATININE 2.89* 2.93*  CALCIUM 8.7 8.7  MG -- --  PHOS -- --   Liver Function Tests: No results found for this basename: AST:2,ALT:2,ALKPHOS:2,BILITOT:2,PROT:2,ALBUMIN:2 in the last 72 hours No results found for this basename: LIPASE:2,AMYLASE:2 in the last 72 hours No results found for this basename: AMMONIA:2 in the last 72 hours CBC:  Basename 01/31/12 0411 01/30/12 0551  WBC 24.6* 28.8*  NEUTROABS -- --  HGB 8.6* 7.3*  HCT 25.0* 22.3*  MCV 80.6 81.4  PLT 265 327   Cardiac Enzymes: No results found for this basename: CKTOTAL:3,CKMB:3,CKMBINDEX:3,TROPONINI:3 in the last 72 hours BNP: No results found for this basename: PROBNP:3 in the last 72 hours D-Dimer: No results found for this basename: DDIMER:2 in the last 72  hours CBG:  Basename 01/31/12 0749 01/31/12 0401 01/31/12 0138 01/31/12 0042 01/30/12 2345 01/30/12 2240  GLUCAP 327* 238* 117* 135* 148* 147*   Hemoglobin A1C: No results found for this basename: HGBA1C in the last 72 hours Fasting Lipid Panel: No results found for this basename: CHOL,HDL,LDLCALC,TRIG,CHOLHDL,LDLDIRECT in the last 72 hours Thyroid Function Tests: No results found for this basename: TSH,T4TOTAL,FREET4,T3FREE,THYROIDAB in the last 72 hours Anemia Panel: No results found for this basename: VITAMINB12,FOLATE,FERRITIN,TIBC,IRON,RETICCTPCT in the last 72 hours Coagulation: No results found for this basename: LABPROT:2,INR:2 in the last 72 hours Urine Drug Screen: Drugs of Abuse  No results found for this basename: labopia, cocainscrnur, labbenz, amphetmu, thcu, labbarb    Alcohol Level: No results found for this basename: ETH:2 in the last 72 hours Urinalysis: No results found for this basename: COLORURINE:2,APPERANCEUR:2,LABSPEC:2,PHURINE:2,GLUCOSEU:2,HGBUR:2,BILIRUBINUR:2,KETONESUR:2,PROTEINUR:2,UROBILINOGEN:2,NITRITE:2,LEUKOCYTESUR:2 in the last 72 hours Misc. Labs:  ABGS  Basename 01/31/12 0409  PHART 7.305*  PO2ART 86.8  TCO2 17.4  HCO3 18.3*   CULTURES Recent Results (from the past 240 hour(s))  URINE CULTURE     Status: Normal   Collection Time   01/21/12  3:36 PM      Component Value Range Status Comment   Specimen Description URINE, CATHETERIZED   Final    Special Requests NONE   Final    Culture  Setup Time [id number]   Final    Colony Count >=100,000 COLONIES/ML   Final    Culture     Final    Value: ENTEROCOCCUS SPECIES     YEAST   Report Status 01/25/2012 FINAL  Final    Organism ID, Bacteria ENTEROCOCCUS SPECIES   Final   MRSA PCR SCREENING     Status: Abnormal   Collection Time   01/22/12  9:35 AM      Component Value Range Status Comment   MRSA by PCR INVALID RESULTS, SPECIMEN SENT FOR CULTURE (*) NEGATIVE  Final   MRSA CULTURE      Status: Normal   Collection Time   01/22/12  9:35 AM      Component Value Range Status Comment   Specimen Description NOSE NASAL SWAB   Final    Special Requests NONE   Final    Culture     Final    Value: NO STAPHYLOCOCCUS AUREUS ISOLATED     Note: NOMRSA   Report Status 01/24/2012 FINAL   Final   CULTURE, BLOOD (ROUTINE X 2)     Status: Normal   Collection Time   01/24/12  4:59 PM      Component Value Range Status Comment   Specimen Description BLOOD RIGHT HAND   Final    Special Requests BOTTLES DRAWN AEROBIC AND ANAEROBIC 8CC   Final    Culture NO GROWTH 5 DAYS   Final    Report Status 01/29/2012 FINAL   Final   CULTURE, BLOOD (ROUTINE X 2)     Status: Normal   Collection Time   01/24/12  5:03 PM      Component Value Range Status Comment   Specimen Description BLOOD LEFT HAND   Final    Special Requests BOTTLES DRAWN AEROBIC AND ANAEROBIC 8CC   Final    Culture NO GROWTH 5 DAYS   Final    Report Status 01/29/2012 FINAL   Final   CLOSTRIDIUM DIFFICILE BY PCR     Status: Normal   Collection Time   01/27/12  9:00 PM      Component Value Range Status Comment   C difficile by pcr NEGATIVE  NEGATIVE  Final    Studies/Results: US Renal  01/30/2012  *RADIOLOGY REPORT*  Clinical Data: 51 year old male with renal failure.  History of diabetes.  RENAL/URINARY TRACT ULTRASOUND COMPLETE  Comparison:  01/23/2012.  CT abdomen and pelvis 09/19/2011 and earlier.  Findings:  Right Kidney:  No hydronephrosis.  Renal length 10.3 cm (previously 11.2 cm).  Corticomedullary differentiation is preserved, but cortical echotexture appears increased compared to the liver. Trace perinephric fluid.  Left Kidney:  No hydronephrosis.  Renal length 10.8 cm (previously 10.5 cm).  Corticomedullary differentiation is preserved the renal cortical echotexture is increased.  Trace perinephric fluid.  Bladder:  Decompressed, Foley catheter reportedly in place.  IMPRESSION: No obstructive uropathy.  Trace bilateral  perinephric fluid is nonspecific.  Bilateral renal cortical echotexture appears increased suggesting sequelae of medical renal disease.  Original Report Authenticated By: Randall An, M.D.   Dg Chest Port 1 View  01/30/2012  *RADIOLOGY REPORT*  Clinical Data: Central line placement.  PORTABLE CHEST - 1 VIEW  Comparison: 01/30/2012 at 0937 hours.  Findings: Interval placement of a left subclavian central line with tip projecting over the SVC.  No pneumothorax.  Remainder of support apparatus is stable in position.  Severe diffuse bilateral air space disease is unchanged.  Tiny right pleural effusion.  IMPRESSION:  1.  Left subclavian central line placement without pneumothorax. 2.  Severe diffuse bilateral air space disease and tiny right pleural effusion.  Original Report Authenticated By: Luretha Rued, M.D.   Dg Chest Port 1 View  01/30/2012  *  RADIOLOGY REPORT*  Clinical Data: Endotracheal tube placement.  PORTABLE CHEST - 1 VIEW  Comparison: 01/30/2012 at 0425 hours.  Findings: Endotracheal tube is in satisfactory position. Nasogastric tube is followed into the stomach.  Heart size normal. There is severe diffuse bilateral air space disease, with possible slight interval improvement, especially in the right upper lobe. Tiny right pleural effusion.  IMPRESSION:  1.  Endotracheal tube is in satisfactory position. 2.  There is severe diffuse bilateral air space disease, with minimal interval improvement in aeration, especially in the right upper lobe. 3.  Tiny right pleural effusion.  Original Report Authenticated By: Luretha Rued, M.D.   Dg Chest Port 1 View  01/30/2012  *RADIOLOGY REPORT*  Clinical Data: The increase shortness of breath.  PORTABLE CHEST - 1 VIEW  Comparison: 01/29/2012  Findings: Diffuse air space disease throughout both lungs with increased density since the previous study.  Changes might represent pneumonia, edema, or ARDS.  Small right pleural effusion is unchanged.  No  pneumothorax.  Heart size and pulmonary vascularity are obscured by the parenchymal process.  IMPRESSION: Increasing diffuse airspace disease throughout the lungs.  Small right pleural effusion.  Original Report Authenticated By: Neale Burly, M.D.   Dg Chest Port 1 View  01/29/2012  *RADIOLOGY REPORT*  Clinical Data: Pneumonia.  PORTABLE CHEST - 1 VIEW  Comparison: Chest x-ray 01/24/2012.  Findings: Compared to the recent prior radiograph, there has been significant worsening aeration in the lungs bilaterally, with multifocal interstitial and patchy airspace disease scattered throughout all aspects of the lungs on today's examination.  Slight improvement in aeration is noted relatively speaking within the right upper lobe.  Blunting of the right costophrenic sulcus could suggest a small right-sided pleural effusion (unchanged). Assessment of pulmonary vasculature is not possible given the overlying interstitial and airspace opacities on today's examination.  Heart size is normal.  Mediastinal contours are unremarkable.  IMPRESSION: 1.  Significantly worsened aeration throughout the lungs bilaterally, compatible with worsening multilobar pneumonia. 2.  Small right-sided pleural effusion.  Original Report Authenticated By: Etheleen Mayhew, M.D.    Medications:  Prior to Admission:  Prescriptions prior to admission  Medication Sig Dispense Refill  . insulin aspart (NOVOLOG) 100 UNIT/ML injection Inject 1-6 Units into the skin 3 (three) times daily as needed. Per sliding scale instructions: If 150-200= 1 unit, 201-250= 2 units, 251-300= 3 units, 301-350= 4 units, 351-400= 5 units, Greater than 400= 6 units// in addition to Sliding Scale      . insulin glargine (LANTUS) 100 UNIT/ML injection Inject 30 Units into the skin at bedtime.       Marland Kitchen lisinopril (PRINIVIL,ZESTRIL) 10 MG tablet Take 10 mg by mouth every morning.       . polyethylene glycol (MIRALAX / GLYCOLAX) packet Take 17 g by mouth every  morning.       . ranitidine (ZANTAC) 150 MG capsule Take 150 mg by mouth 2 (two) times daily.        . sertraline (ZOLOFT) 50 MG tablet Take 150 mg by mouth every morning.       . sulfamethoxazole-trimethoprim (BACTRIM DS) 800-160 MG per tablet Take 1 tablet by mouth every evening.  30 tablet  1  . Tamsulosin HCl (FLOMAX) 0.4 MG CAPS Take 0.4 mg by mouth every morning.       . traZODone (DESYREL) 50 MG tablet Take 50 mg by mouth at bedtime.        . ziprasidone (GEODON) 40 MG capsule  Take 40 mg by mouth 2 (two) times daily with a meal.        . acetaminophen (TYLENOL) 325 MG tablet Take 650 mg by mouth every 6 (six) hours as needed. For pain      . hyoscyamine (LEVSIN SL) 0.125 MG SL tablet Take 0.125-0.25 mg by mouth every 4 (four) hours as needed. Place one or two tablets under tongue every 4 hours if needed for relief of abdominal pain.       Scheduled:   . albuterol  2.5 mg Nebulization Q4H   And  . ipratropium  0.5 mg Nebulization Q4H  . antiseptic oral rinse  15 mL Mouth Rinse QID  . chlorhexidine  15 mL Mouth Rinse BID  . enoxaparin (LOVENOX) injection  30 mg Subcutaneous Q24H  . famotidine  20 mg Oral Daily  . feeding supplement  237 mL Oral BID BM  . fluconazole  100 mg Oral Daily  . furosemide  60 mg Intravenous BID  . hydrocortisone sodium succinate  50 mg Intravenous Q6H  . insulin aspart  0-15 Units Subcutaneous Q4H  . insulin aspart  20 Units Subcutaneous Once  . insulin regular  0-10 Units Intravenous TID WC  . levofloxacin (LEVAQUIN) IV  500 mg Intravenous Q48H  . levofloxacin (LEVAQUIN) IV  750 mg Intravenous Once  . midazolam      . pantoprazole (PROTONIX) IV  40 mg Intravenous Daily  . polyethylene glycol  17 g Oral q morning - 10a  . sodium bicarbonate  100 mEq Intravenous Once  . sodium chloride  1,000 mL Intravenous Once  . sodium chloride  500 mL Intravenous Once  . sodium chloride  10-40 mL Intracatheter Q12H  . sodium chloride      . Tamsulosin HCl  0.4 mg  Oral Daily  . traZODone  50 mg Oral QHS  . ziprasidone  40 mg Oral BID WC  . DISCONTD: albuterol  2.5 mg Nebulization Q6H  . DISCONTD: cefTRIAXone (ROCEPHIN)  IV  1 g Intravenous Q24H  . DISCONTD: enoxaparin (LOVENOX) injection  40 mg Subcutaneous Q24H  . DISCONTD: insulin aspart  0-15 Units Subcutaneous TID WC  . DISCONTD: insulin aspart  15 Units Subcutaneous TID WC  . DISCONTD: insulin glargine  35 Units Subcutaneous QHS  . DISCONTD: insulin glargine  40 Units Subcutaneous QHS  . DISCONTD: ipratropium  0.5 mg Nebulization Q6H  . DISCONTD: lisinopril  10 mg Oral Daily  . DISCONTD: sertraline  150 mg Oral Daily  . DISCONTD: vancomycin  1,000 mg Intravenous Q12H  . DISCONTD: vancomycin  1,000 mg Intravenous Q24H   Continuous:   . dextrose 5 % and 0.45% NaCl 50 mL/hr at 01/30/12 2000  . insulin (NOVOLIN-R) infusion Stopped (01/31/12 0133)  .  sodium bicarbonate infusion 1/4 NS 1000 mL 100 mL/hr at 01/31/12 0600  . DISCONTD: sodium chloride    . DISCONTD: sodium chloride Stopped (01/30/12 1711)  . DISCONTD: norepinephrine (LEVOPHED) Adult infusion 10 mcg/min (01/30/12 1200)  . DISCONTD:  sodium bicarbonate infusion 1000 mL Stopped (01/30/12 1627)   [id number], ALPRAZolam, alum & mag hydroxide-simeth, dextrose, DOBUTamine, hyoscyamine, midazolam, morphine injection, norepinephrine (LEVOPHED) Adult infusion, ondansetron, sodium chloride, sodium chloride  Assesment: He has bilateral pneumonia. He had markedly elevated blood sugar and probably DKA. He appeared to be septic. He has acute on chronic renal failure. He had marked acidemia which is better with the use of sodium bicarbonate. His blood sugars better. He is still in respiratory failure on a  ventilator. Active Problems:  * No active hospital problems. *     Plan: I don't think we have any opportunity to try to wean him yet today. I agree with Dr. Legrand Rams is plans to go ahead and start tube feedings. I would continue with all  the other treatments including Triple Antibiotic coverage low-dose steroids.    LOS: 10 days   Tirzah Fross L 01/31/2012, 8:07 AM

## 2012-01-31 NOTE — Progress Notes (Signed)
Patient ID: [name], male   DOB: June 05, 1961, 51 y.o.   MRN: [id number]

## 2012-01-31 NOTE — Progress Notes (Signed)
[name], [name]           ACCOUNT NO.:  [id number]  MEDICAL RECORD NO.:  [id number]  LOCATION:  IC03                          FACILITY:  [facility]  PHYSICIAN:  Loni Beckwith, MD DATE OF BIRTH:  Feb 17, 1961  DATE OF PROCEDURE:  01/31/2012 DATE OF DISCHARGE:                                PROGRESS NOTE   REASON FOR FOLLOW UP:  Uncontrolled type 1 diabetes.  SUBJECTIVE:  Events overnight been reviewed.  The patient is currently off of the insulin drip.  Per nursing, he is going to be started on Glucerna tube feeding.  No hypoglycemia.  Glycemia control was achieved to high 100 to low 200 overnight.  The patient is now off of pressors.  OBJECTIVE:  GENERAL:  Still on the vent. VITAL SIGNS:  Blood pressure 147/67, pulse rate is 83-85, temperature 96.7.  CHEST:  Significant for diffuse rhonchi. CARDIOVASCULAR:  Distant heart sounds. ABDOMEN:  Hypoactive bowel sounds. EXTREMITIES:  No edema.  His blood glucose this morning was 327 after his insulin infusion was stopped.  ASSESSMENT: 1. Diabetic ketoacidosis, resolved.  His last bicarb is 19 with     potassium 3.8. 2. Respiratory failure with bilateral pneumonia, currently on     mechanical ventilation. 3. Acute kidney injury secondary to prerenal azotemia. 4. Metabolic acidosis, resolving.  PLAN:  I would like to resume the insulin infusion while the patient is being stabilized.  This patient is with multiple problems and organ failures, unlikely to resume oral feeding anytime soon.  For better chance of healing, glycemic control needs to be achieved to near normal. I agree with resumption of Glucerna per tube feeding as tolerated. Continue fingerstick blood glucose every hour per protocol to do an insulin infusion.  I will re-evaluate in 24 hours and see if we can convert to subcutaneous insulin injection.         ______________________________ Loni Beckwith, MD    GN/MEDQ  D:  01/31/2012  T:  01/31/2012   Job:  [id number]

## 2012-01-31 NOTE — Progress Notes (Signed)
NAME:  [name], [name]           ACCOUNT NO.:  [id number]  MEDICAL RECORD NO.:  [id number]  LOCATION:  IC03                          FACILITY:  [facility]  PHYSICIAN:  Emryn Flanery D. Legrand Rams, MD   DATE OF BIRTH:  [birth year]  DATE OF PROCEDURE:  01/31/2012 DATE OF DISCHARGE:                                PROGRESS NOTE   SUBJECTIVE:  The patient is currently on ventilatory support.  He is unable to respond to verbal communication.  The patient had respiratory distress and hypotension yesterday.  He also developed metabolic acidosis with hyperglycemia consistent with diabetic ketoacidosis.  The patient was intubated and transferred to ICU.  He is currently sedated and unable to give any history.  OBJECTIVE:  GENERAL:  The patient is orally intubated and being ventilated. VITAL SIGNS:  Blood pressure 143/62, pulse 100, respiratory rate 29, temperature 97.6 degrees Fahrenheit. CHEST:  Poor air entry, bilateral rhonchi. CARDIOVASCULAR SYSTEM:  First and second heart sound heard.  No murmur. No gallop. ABDOMEN:  Soft and lax.  Bowel sound is positive.  No mass or organomegaly.  EXTREMITIES:  No leg edema.  LABORATORY DATA:  Sodium 132, potassium 3.8, chloride 98, carbon dioxide 19, blood glucose 263, BUN 53, creatinine 2.89, calcium 8.7.  CBC:  WBC 2.4, hemoglobin 8.6, hematocrit 25.6, and platelets 265,000.  ASSESSMENT: 1. Ventilatory dependent respiratory failure secondary to     multifactorial causes. 2. Bilateral pneumonia. 3. Diabetic ketoacidosis. 4. Acute on chronic renal failure. 5. Anemia.  PLAN:  We will continue the patient on combination IV antibiotics. Continue ventilatory support.  Continue insulin therapy according to endocrine recommendation.  We will start the patient on NG tube feeding and continue supportive care.     Jazzie Trampe D. Legrand Rams, MD     TDF/MEDQ  D:  01/31/2012  T:  01/31/2012  Job:  [id number]

## 2012-01-31 NOTE — Consult Note (Signed)
ANTIBIOTIC CONSULT NOTE   Pharmacy Consult for Vancomycin and Levaquin Indication: septic shock protocol, UTI, pna  No Known Allergies  Patient Measurements: Height: 5\' 10"  (177.8 cm) Weight: 149 lb 14.6 oz (68 kg) IBW/kg (Calculated) : 73   Vital Signs: Temp: 97.4 F (36.3 C) (05/30 0400) Temp src: Axillary (05/30 0400) BP: 141/101 mmHg (05/30 0600) Pulse Rate: 100  (05/30 0600) Intake/Output from previous day: 05/29 0701 - 05/30 0700 In: 6233.9 [I.V.:3711.9; Blood:312; IV Piggyback:2210] Out: 650 [Urine:500; Emesis/NG output:150] Intake/Output from this shift:    Labs:  Basename 01/31/12 0411 01/30/12 2158 01/30/12 1646 01/30/12 1215 01/30/12 0551  WBC 24.6* -- -- -- 28.8*  HGB 8.6* -- -- -- 7.3*  PLT 265 -- -- -- 327  LABCREA -- -- 136.90 -- --  CREATININE 2.89* 2.93* -- 3.19* --   Estimated Creatinine Clearance: 29.4 ml/min (by C-G formula based on Cr of 2.89).  Basename 01/30/12 2158  VANCOTROUGH --  VANCOPEAK --  VANCORANDOM 37.1  GENTTROUGH --  [place] --  AMIKACIN --    Microbiology: Recent Results (from the past 720 hour(s))  URINE CULTURE     Status: Normal   Collection Time   01/11/12 10:49 PM      Component Value Range Status Comment   Specimen Description URINE, CATHETERIZED   Final    Special Requests NONE   Final    Culture  Setup Time [id number]   Final    Colony Count >=100,000 COLONIES/ML   Final    Culture     Final    Value: Multiple bacterial morphotypes present, none predominant. Suggest appropriate recollection if clinically indicated.   Report Status 01/13/2012 FINAL   Final   URINE CULTURE     Status: Normal   Collection Time   01/21/12  3:36 PM      Component Value Range Status Comment   Specimen Description URINE, CATHETERIZED   Final    Special Requests NONE   Final    Culture  Setup Time [id number]   Final    Colony Count  >=100,000 COLONIES/ML   Final    Culture     Final    Value: ENTEROCOCCUS SPECIES     YEAST   Report Status 01/25/2012 FINAL   Final    Organism ID, Bacteria ENTEROCOCCUS SPECIES   Final   MRSA PCR SCREENING     Status: Abnormal   Collection Time   01/22/12  9:35 AM      Component Value Range Status Comment   MRSA by PCR INVALID RESULTS, SPECIMEN SENT FOR CULTURE (*) NEGATIVE  Final   MRSA CULTURE     Status: Normal   Collection Time   01/22/12  9:35 AM      Component Value Range Status Comment   Specimen Description NOSE NASAL SWAB   Final    Special Requests NONE   Final    Culture     Final    Value: NO STAPHYLOCOCCUS AUREUS ISOLATED     Note: NOMRSA   Report Status 01/24/2012 FINAL   Final   CULTURE, BLOOD (ROUTINE X 2)     Status: Normal   Collection Time   01/24/12  4:59 PM      Component Value Range Status Comment   Specimen Description BLOOD RIGHT HAND   Final    Special Requests BOTTLES DRAWN AEROBIC AND ANAEROBIC 8CC  Final    Culture NO GROWTH 5 DAYS   Final    Report Status 01/29/2012 FINAL   Final   CULTURE, BLOOD (ROUTINE X 2)     Status: Normal   Collection Time   01/24/12  5:03 PM      Component Value Range Status Comment   Specimen Description BLOOD LEFT HAND   Final    Special Requests BOTTLES DRAWN AEROBIC AND ANAEROBIC 8CC   Final    Culture NO GROWTH 5 DAYS   Final    Report Status 01/29/2012 FINAL   Final   CLOSTRIDIUM DIFFICILE BY PCR     Status: Normal   Collection Time   01/27/12  9:00 PM      Component Value Range Status Comment   C difficile by pcr NEGATIVE  NEGATIVE  Final    Medical History: Past Medical History  Diagnosis Date  . Renal disorder   . Elective mutism   . Autism   . Hyperosmolar (nonketotic) coma   . Hyperglycemia   . Diabetes mellitus   . Hyponatremia   . Mental retardation   . Urinary retention   . BPH (benign prostatic hyperplasia)   . Gastroparesis   . Depression   . Pneumonia   . UTI (urinary tract infection)   .  Mental retardation    Medications:  Scheduled:     . albuterol  2.5 mg Nebulization Q4H   And  . ipratropium  0.5 mg Nebulization Q4H  . antiseptic oral rinse  15 mL Mouth Rinse QID  . chlorhexidine  15 mL Mouth Rinse BID  . enoxaparin (LOVENOX) injection  30 mg Subcutaneous Q24H  . famotidine  20 mg Oral Daily  . feeding supplement  237 mL Oral BID BM  . fluconazole  100 mg Oral Daily  . furosemide  60 mg Intravenous BID  . hydrocortisone sodium succinate  50 mg Intravenous Q6H  . insulin aspart  0-15 Units Subcutaneous Q4H  . insulin aspart  20 Units Subcutaneous Once  . insulin regular  0-10 Units Intravenous TID WC  . levofloxacin (LEVAQUIN) IV  500 mg Intravenous Q48H  . levofloxacin (LEVAQUIN) IV  750 mg Intravenous Once  . midazolam      . pantoprazole (PROTONIX) IV  40 mg Intravenous Daily  . polyethylene glycol  17 g Oral q morning - 10a  . sodium bicarbonate  100 mEq Intravenous Once  . sodium chloride  1,000 mL Intravenous Once  . sodium chloride  500 mL Intravenous Once  . sodium chloride  10-40 mL Intracatheter Q12H  . sodium chloride      . Tamsulosin HCl  0.4 mg Oral Daily  . traZODone  50 mg Oral QHS  . ziprasidone  40 mg Oral BID WC  . DISCONTD: albuterol  2.5 mg Nebulization Q6H  . DISCONTD: cefTRIAXone (ROCEPHIN)  IV  1 g Intravenous Q24H  . DISCONTD: enoxaparin (LOVENOX) injection  40 mg Subcutaneous Q24H  . DISCONTD: insulin aspart  0-15 Units Subcutaneous TID WC  . DISCONTD: insulin aspart  15 Units Subcutaneous TID WC  . DISCONTD: insulin glargine  35 Units Subcutaneous QHS  . DISCONTD: insulin glargine  40 Units Subcutaneous QHS  . DISCONTD: ipratropium  0.5 mg Nebulization Q6H  . DISCONTD: lisinopril  10 mg Oral Daily  . DISCONTD: sertraline  150 mg Oral Daily  . DISCONTD: vancomycin  1,000 mg Intravenous Q12H  . DISCONTD: vancomycin  1,000 mg Intravenous Q24H   Assessment: Pt  currently on day #7 Vancomycin and day#6 diflucan for CAP and  enterococcal UTI.  Pt now with respiratory distress, transferred to ICU, to add Levaquin day #2.  Pt has worsening renal fxn, acute on chronic renal failure, meds adjusted, ? Vancomycin induced or disease related (ATN).  Levaquin, Diflucan, and Geodon can cause QT prolongation. Vancomycin level last night was above goal. SCr slightly improved  Goal of Therapy:  Vancomycin trough level 10-15 (renal failure) Monitor renal fxn daily Eradicate infection.  Plan: 1) Stop Vancomycin for now 2) re-check random level tomorrow am 3) Levaquin 750mg  iv now x 1 dose then 500mg  q48hours 4)  Monitor SCr dailiy for now  Hart Robinsons A 01/31/2012,7:55 AM

## 2012-01-31 NOTE — Clinical Social Work Note (Signed)
Lucius Conn, [facility] DSS, met with patient at bedside and requested hospital records regarding patient from Medical Records.  Copy of Request for Records placed in chart.  Per Ms. Frutoso Chase, she will be requesting emergency guardianship on behalf of [facility].  Emergency guardianship should be in place by tomorrow morning.    Justice Britain Clinical Social Worker [phone])

## 2012-01-31 NOTE — Progress Notes (Signed)
*  PRELIMINARY RESULTS* Echocardiogram 2D Echocardiogram has been performed.  [name] 01/31/2012, 11:16 AM

## 2012-02-01 ENCOUNTER — Inpatient Hospital Stay (HOSPITAL_COMMUNITY): Payer: Medicaid Other

## 2012-02-01 LAB — GLUCOSE, CAPILLARY
Glucose-Capillary: 146 mg/dL — ABNORMAL HIGH (ref 70–99)
Glucose-Capillary: 228 mg/dL — ABNORMAL HIGH (ref 70–99)
Glucose-Capillary: 438 mg/dL — ABNORMAL HIGH (ref 70–99)
Glucose-Capillary: 445 mg/dL — ABNORMAL HIGH (ref 70–99)
Glucose-Capillary: 428 mg/dL — ABNORMAL HIGH (ref 70–99)

## 2012-02-01 LAB — CBC
HCT: 24.5 % — ABNORMAL LOW (ref 39.0–52.0)
Hemoglobin: 8.5 g/dL — ABNORMAL LOW (ref 13.0–17.0)
Hemoglobin: 8.6 g/dL — ABNORMAL LOW (ref 13.0–17.0)
MCH: 27.2 pg (ref 26.0–34.0)
MCHC: 34.1 g/dL (ref 30.0–36.0)
MCV: 79.5 fL (ref 78.0–100.0)
MCV: 79.7 fL (ref 78.0–100.0)
RDW: 14.8 % (ref 11.5–15.5)
WBC: 21.6 10*3/uL — ABNORMAL HIGH (ref 4.0–10.5)

## 2012-02-01 LAB — BLOOD GAS, ARTERIAL
Acid-Base Excess: 0.1 mmol/L (ref 0.0–2.0)
Drawn by: 21310
FIO2: 45 %
MECHVT: 500 mL
O2 Saturation: 98.2 %
PEEP: 5 cmH2O
Patient temperature: 37
RATE: 20 resp/min
pO2, Arterial: 119 mmHg — ABNORMAL HIGH (ref 80.0–100.0)

## 2012-02-01 LAB — BASIC METABOLIC PANEL
BUN: 60 mg/dL — ABNORMAL HIGH (ref 6–23)
BUN: 72 mg/dL — ABNORMAL HIGH (ref 6–23)
CO2: 25 mEq/L (ref 19–32)
CO2: 25 mEq/L (ref 19–32)
Calcium: 8.8 mg/dL (ref 8.4–10.5)
Chloride: 100 mEq/L (ref 96–112)
Creatinine, Ser: 2.71 mg/dL — ABNORMAL HIGH (ref 0.50–1.35)
GFR calc Af Amer: 30 mL/min — ABNORMAL LOW (ref >90)
GFR calc non Af Amer: 26 mL/min — ABNORMAL LOW (ref >90)
Glucose, Bld: 242 mg/dL — ABNORMAL HIGH (ref 70–99)
Potassium: 3.3 mEq/L — ABNORMAL LOW (ref 3.5–5.1)
Potassium: 3.7 mEq/L (ref 3.5–5.1)
Potassium: 3.8 mEq/L (ref 3.5–5.1)
Sodium: 131 mEq/L — ABNORMAL LOW (ref 135–145)
Sodium: 130 mEq/L — ABNORMAL LOW (ref 135–145)

## 2012-02-01 LAB — DIFFERENTIAL
Basophils Relative: 0 % (ref 0–1)
Eosinophils Absolute: 0 10*3/uL (ref 0.0–0.7)
Eosinophils Relative: 0 % (ref 0–5)
Monocytes Absolute: 0.6 10*3/uL (ref 0.1–1.0)
Monocytes Relative: 3 % (ref 3–12)
Neutrophils Relative %: 96 % — ABNORMAL HIGH (ref 43–77)

## 2012-02-01 MED ORDER — INSULIN NPH (HUMAN) (ISOPHANE) 100 UNIT/ML ~~LOC~~ SUSP
30.0000 [IU] | Freq: Three times a day (TID) | SUBCUTANEOUS | Status: DC
  Administered 2012-02-02 – 2012-02-03 (×3): 30 [IU] via SUBCUTANEOUS

## 2012-02-01 MED ORDER — MIDAZOLAM BOLUS VIA INFUSION
1.0000 mg | INTRAVENOUS | Status: DC | PRN
  Administered 2012-02-09: 2 mg via INTRAVENOUS
  Filled 2012-02-01 (×2): qty 2

## 2012-02-01 MED ORDER — FENTANYL BOLUS VIA INFUSION
50.0000 ug | Freq: Four times a day (QID) | INTRAVENOUS | Status: DC | PRN
  Administered 2012-02-09: 50 ug via INTRAVENOUS
  Filled 2012-02-01 (×3): qty 100

## 2012-02-01 MED ORDER — ENOXAPARIN SODIUM 40 MG/0.4ML ~~LOC~~ SOLN
40.0000 mg | SUBCUTANEOUS | Status: DC
  Administered 2012-02-01 – 2012-02-13 (×13): 40 mg via SUBCUTANEOUS
  Filled 2012-02-01 (×13): qty 0.4

## 2012-02-01 MED ORDER — INSULIN REGULAR BOLUS VIA INFUSION
0.0000 [IU] | Freq: Three times a day (TID) | INTRAVENOUS | Status: DC
  Filled 2012-02-01: qty 10

## 2012-02-01 MED ORDER — SODIUM CHLORIDE 0.9 % IV SOLN
INTRAVENOUS | Status: DC
  Administered 2012-02-01: 3.9 [IU]/h via INTRAVENOUS
  Filled 2012-02-01: qty 1

## 2012-02-01 MED ORDER — DEXTROSE-NACL 5-0.45 % IV SOLN
INTRAVENOUS | Status: DC
  Administered 2012-02-02 – 2012-02-04 (×2): via INTRAVENOUS

## 2012-02-01 MED ORDER — SODIUM CHLORIDE 0.9 % IV SOLN
INTRAVENOUS | Status: DC
  Administered 2012-02-01: 20:00:00 via INTRAVENOUS

## 2012-02-01 MED ORDER — INSULIN ASPART 100 UNIT/ML ~~LOC~~ SOLN
15.0000 [IU] | Freq: Once | SUBCUTANEOUS | Status: AC
  Administered 2012-02-01: 15 [IU] via SUBCUTANEOUS

## 2012-02-01 MED ORDER — INSULIN NPH (HUMAN) (ISOPHANE) 100 UNIT/ML ~~LOC~~ SUSP
20.0000 [IU] | Freq: Three times a day (TID) | SUBCUTANEOUS | Status: DC
  Administered 2012-02-01 (×2): 20 [IU] via SUBCUTANEOUS
  Filled 2012-02-01: qty 10

## 2012-02-01 MED ORDER — POTASSIUM CHLORIDE IN NACL 20-0.45 MEQ/L-% IV SOLN
INTRAVENOUS | Status: DC
  Administered 2012-02-01: 20:00:00 via INTRAVENOUS
  Administered 2012-02-01: 100 mL/h via INTRAVENOUS
  Administered 2012-02-02 – 2012-02-03 (×4): via INTRAVENOUS
  Administered 2012-02-04: 1000 mL via INTRAVENOUS
  Administered 2012-02-05 – 2012-02-08 (×5): via INTRAVENOUS
  Filled 2012-02-01 (×20): qty 1000

## 2012-02-01 MED ORDER — SODIUM CHLORIDE 0.9 % IV SOLN
2.0000 mg/h | INTRAVENOUS | Status: DC
  Administered 2012-02-01: 2 mg/h via INTRAVENOUS
  Administered 2012-02-01: 4 mg/h via INTRAVENOUS
  Administered 2012-02-02: 2 mg/h via INTRAVENOUS
  Administered 2012-02-03 – 2012-02-04 (×2): 4 mg/h via INTRAVENOUS
  Administered 2012-02-05: 5 mg/h via INTRAVENOUS
  Administered 2012-02-06: 4 mg/h via INTRAVENOUS
  Administered 2012-02-06: 5 mg/h via INTRAVENOUS
  Administered 2012-02-06: 3 mg/h via INTRAVENOUS
  Administered 2012-02-07: 5 mg/h via INTRAVENOUS
  Administered 2012-02-08: 2 mg/h via INTRAVENOUS
  Administered 2012-02-08: 5 mg/h via INTRAVENOUS
  Administered 2012-02-08: 2 mg/h via INTRAVENOUS
  Administered 2012-02-09 (×2): 3 mg/h via INTRAVENOUS
  Administered 2012-02-09: 2 mg/h via INTRAVENOUS
  Administered 2012-02-10: 3 mg/h via INTRAVENOUS
  Filled 2012-02-01 (×11): qty 10

## 2012-02-01 MED ORDER — DEXTROSE 50 % IV SOLN: 25.0000 mL | INTRAVENOUS | Status: DC | PRN

## 2012-02-01 MED ORDER — INSULIN REGULAR HUMAN 100 UNIT/ML IJ SOLN
INTRAMUSCULAR | Status: AC
  Filled 2012-02-01: qty 3

## 2012-02-01 MED ORDER — SODIUM CHLORIDE 0.9 % IV SOLN
50.0000 ug/h | INTRAVENOUS | Status: DC
  Administered 2012-02-01: 50 ug/h via INTRAVENOUS
  Administered 2012-02-02: 150 ug/h via INTRAVENOUS
  Administered 2012-02-04: 80 ug/h via INTRAVENOUS
  Administered 2012-02-05: 150 ug/h via INTRAVENOUS
  Administered 2012-02-06: 75 ug/h via INTRAVENOUS
  Administered 2012-02-06: 150 ug/h via INTRAVENOUS
  Administered 2012-02-07: 100 ug/h via INTRAVENOUS
  Administered 2012-02-08 (×3): 50 ug/h via INTRAVENOUS
  Filled 2012-02-01 (×5): qty 50

## 2012-02-01 MED ORDER — LEVOFLOXACIN IN D5W 750 MG/150ML IV SOLN
750.0000 mg | INTRAVENOUS | Status: DC
  Administered 2012-02-01 – 2012-02-09 (×5): 750 mg via INTRAVENOUS
  Filled 2012-02-01 (×6): qty 150

## 2012-02-01 NOTE — Progress Notes (Signed)
Called Dr. Dorris Fetch with CBG 438. Order received to give 15 units of Novolog insulin x1 now. And to increase NPH to 30 units. Recheck CBG 2 hours from now and call back with results. Will continue to monitor.

## 2012-02-01 NOTE — Progress Notes (Signed)
NAME:  [name], [name]           ACCOUNT NO.:  [id number]  MEDICAL RECORD NO.:  [id number]  LOCATION:  IC03                          FACILITY:  [facility]  PHYSICIAN:  Evvie Behrmann D. Legrand Rams, MD   DATE OF BIRTH:  1961/07/23  DATE OF PROCEDURE:  02/01/2012 DATE OF DISCHARGE:                                PROGRESS NOTE   SUBJECTIVE:  The patient is awake and making eye contact.  He has stayed on ventilatory support, orally intubated.  No fever or chills.  OBJECTIVE:  VITAL SIGNS:  Blood pressure 100/48, pulse 72, respiratory rate 14, temperature 96.6 degrees Fahrenheit. CHEST:  Poor air entry, bilateral rhonchi. CARDIOVASCULAR:  First and second heart sounds heard.  No murmur.  No gallop. ABDOMEN:  Soft and lax.  Bowel sounds positive.  No mass or organomegaly. EXTREMITIES:  No leg edema.  LABORATORY DATA:  ABG:  PH 7.376, pCO2 of 43, pO2 of 119, bicarb 24, and saturation 98%.  ASSESSMENT: 1. Ventilatory-dependent respiratory failure. 2. Bilateral pneumonia. 3. Probably sepsis. 4. New diabetes mellitus with diabetic ketoacidosis. 5. Acute on chronic renal failure.  PLAN:  We will continue the patient on combination of IV antibiotics. Continue ventilatory support according to pulmonary recommendation. Continue insulin therapy.  Continue fluid and electrolyte according to Nephrology.     Chieko Neises D. Legrand Rams, MD     TDF/MEDQ  D:  02/01/2012  T:  02/01/2012  Job:  [id number]

## 2012-02-01 NOTE — Progress Notes (Signed)
[name], [name]           ACCOUNT NO.:  [id number]  MEDICAL RECORD NO.:  [id number]  LOCATION:  IC03                          FACILITY:  [facility]  PHYSICIAN:  Loni Beckwith, MD DATE OF BIRTH:  1961-05-16  DATE OF PROCEDURE:  02/01/2012 DATE OF DISCHARGE:                                PROGRESS NOTE   REASON FOR FOLLOWUP:  Uncontrolled type 1 diabetes.  SUBJECTIVE:  The patient remains on mechanical ventilation, however, being weaned.  Remains on insulin drip.  Blood glucose is controlled to near target.  He is tolerating Glucerna at 50 mL per hour.  OBJECTIVE:  GENERAL:  The patient is still on the vent. VITAL SIGNS:  Blood pressure 130/65, pulse rate 79, temperature 97.4. CHEST:  Diffuse rhonchi. CARDIOVASCULAR:  Distant heart sounds. ABDOMEN:  Hypoactive bowel sounds. EXTREMITIES:  No edema.  His chemistry shows sodium 136, potassium 3.3, chloride 100, bicarb 25, BUN 60, creatinine 2.7, calcium 8.8.  CBG ranges from 100-165.  ASSESSMENT: 1. Diabetic ketoacidosis, resolved. 2. Type 1 diabetes difficult to control. 3. Respiratory failure with bilateral pneumonia, currently on     mechanical ventilation. 4. Acute kidney injury secondary to prerenal azotemia.  PLAN:  While the patient is being stabilized, I will attempt to convert to subcutaneous insulin using NPH 20 units every 8 hours and stop the insulin infusion 1 hour after the first NPH injection.  He will need CBG determination every 8 hours, and I will continue to reassess and titrate insulin interpretation.          ______________________________ Loni Beckwith, MD     GN/MEDQ  D:  02/01/2012  T:  02/01/2012  Job:  [id number]

## 2012-02-01 NOTE — Consult Note (Signed)
ANTIBIOTIC CONSULT NOTE   Pharmacy Consult for Vancomycin and Levaquin Indication: septic shock protocol, UTI, pna  No Known Allergies  Patient Measurements: Height: 5\' 10"  (177.8 cm) Weight: 174 lb 9.7 oz (79.2 kg) IBW/kg (Calculated) : 73   Vital Signs: Temp: 97.4 F (36.3 C) (05/31 0400) Temp src: Axillary (05/31 0400) BP: 148/73 mmHg (05/31 0930) Pulse Rate: 78  (05/31 0930) Intake/Output from previous day: 05/30 0701 - 05/31 0700 In: 3321.9 [I.V.:2471.9; NG/GT:550; IV Piggyback:300] Out: 1685 [Urine:1625] Intake/Output from this shift: Total I/O In: 118 [NG/GT:100; IV Piggyback:18] Out: -   Labs:  Basename 02/01/12 0845 02/01/12 0353 02/01/12 0351 01/31/12 1955 01/31/12 1144 01/31/12 0411 01/30/12 1646  WBC 18.6* 21.6* -- -- -- 24.6* --  HGB 8.6* 8.5* -- -- -- 8.6* --  PLT 208 209 -- -- -- 265 --  LABCREA -- -- -- -- -- -- 136.90  CREATININE -- -- 2.71* 2.76* 2.82* -- --   Estimated Creatinine Clearance: 33.7 ml/min (by C-G formula based on Cr of 2.71).  Basename 02/01/12 0352 01/30/12 2158  [place] -- --  VANCOPEAK -- --  VANCORANDOM 35.7 37.1  GENTTROUGH -- --  GENTPEAK -- --  Ginger Organ -- --  TOBRATROUGH -- --  TOBRAPEAK -- --  TOBRARND -- --  AMIKACINPEAK -- --  AMIKACINTROU -- --  AMIKACIN -- --    Microbiology: Recent Results (from the past 720 hour(s))  URINE CULTURE     Status: Normal   Collection Time   01/11/12 10:49 PM      Component Value Range Status Comment   Specimen Description URINE, CATHETERIZED   Final    Special Requests NONE   Final    Culture  Setup Time [id number]   Final    Colony Count >=100,000 COLONIES/ML   Final    Culture     Final    Value: Multiple bacterial morphotypes present, none predominant. Suggest appropriate recollection if clinically indicated.   Report Status 01/13/2012 FINAL   Final   URINE CULTURE     Status: Normal   Collection Time   01/21/12  3:36 PM      Component Value Range Status Comment   Specimen Description URINE, CATHETERIZED   Final    Special Requests NONE   Final    Culture  Setup Time [id number]   Final    Colony Count >=100,000 COLONIES/ML   Final    Culture     Final    Value: ENTEROCOCCUS SPECIES     YEAST   Report Status 01/25/2012 FINAL   Final    Organism ID, Bacteria ENTEROCOCCUS SPECIES   Final   MRSA PCR SCREENING     Status: Abnormal   Collection Time   01/22/12  9:35 AM      Component Value Range Status Comment   MRSA by PCR INVALID RESULTS, SPECIMEN SENT FOR CULTURE (*) NEGATIVE  Final   MRSA CULTURE     Status: Normal   Collection Time   01/22/12  9:35 AM      Component Value Range Status Comment   Specimen Description NOSE NASAL SWAB   Final    Special Requests NONE   Final    Culture     Final    Value: NO STAPHYLOCOCCUS AUREUS ISOLATED     Note: NOMRSA   Report Status 01/24/2012 FINAL   Final   CULTURE, BLOOD (ROUTINE X 2)     Status: Normal   Collection Time   01/24/12  4:59 PM      Component Value Range Status Comment   Specimen Description BLOOD RIGHT HAND   Final    Special Requests BOTTLES DRAWN AEROBIC AND ANAEROBIC 8CC   Final    Culture NO GROWTH 5 DAYS   Final    Report Status 01/29/2012 FINAL   Final   CULTURE, BLOOD (ROUTINE X 2)     Status: Normal   Collection Time   01/24/12  5:03 PM      Component Value Range Status Comment   Specimen Description BLOOD LEFT HAND   Final    Special Requests BOTTLES DRAWN AEROBIC AND ANAEROBIC 8CC   Final    Culture NO GROWTH 5 DAYS   Final    Report Status 01/29/2012 FINAL   Final   CLOSTRIDIUM DIFFICILE BY PCR     Status: Normal   Collection Time   01/27/12  9:00 PM      Component Value Range Status Comment   C difficile by pcr NEGATIVE  NEGATIVE  Final   CULTURE, BLOOD (ROUTINE X 2)     Status: Normal (Preliminary result)   Collection Time   01/30/12 10:24 AM      Component Value Range Status Comment   Specimen Description BLOOD RIGHT HAND   Final    Special Requests BOTTLES DRAWN  AEROBIC AND ANAEROBIC 6CC    Final    Culture NO GROWTH 2 DAYS   Final    Report Status PENDING   Incomplete   CULTURE, BLOOD (ROUTINE X 2)     Status: Normal (Preliminary result)   Collection Time   01/30/12 10:44 AM      Component Value Range Status Comment   Specimen Description BLOOD LEFT HAND   Final    Special Requests BOTTLES DRAWN AEROBIC AND ANAEROBIC 5CC   Final    Culture NO GROWTH 2 DAYS   Final    Report Status PENDING   Incomplete    Medical History: Past Medical History  Diagnosis Date  . Renal disorder   . Elective mutism   . Autism   . Hyperosmolar (nonketotic) coma   . Hyperglycemia   . Diabetes mellitus   . Hyponatremia   . Mental retardation   . Urinary retention   . BPH (benign prostatic hyperplasia)   . Gastroparesis   . Depression   . Pneumonia   . UTI (urinary tract infection)   . Mental retardation    Medications:  Scheduled:     . albuterol  2.5 mg Nebulization Q4H   And  . ipratropium  0.5 mg Nebulization Q4H  . antiseptic oral rinse  15 mL Mouth Rinse QID  . chlorhexidine  15 mL Mouth Rinse BID  . enoxaparin (LOVENOX) injection  40 mg Subcutaneous Q24H  . famotidine  20 mg Oral Daily  . feeding supplement  237 mL Oral BID BM  . fluconazole  100 mg Oral Daily  . furosemide  60 mg Intravenous BID  . hydrocortisone sodium succinate  50 mg Intravenous Q6H  . insulin NPH  20 Units Subcutaneous Q8H  . insulin regular  0-10 Units Intravenous TID WC  . levofloxacin (LEVAQUIN) IV  500 mg Intravenous Q48H  . midazolam  2-4 mg Intravenous Once  . pantoprazole (PROTONIX) IV  40 mg Intravenous Daily  . polyethylene glycol  17 g Oral q morning - 10a  . potassium chloride  10 mEq Intravenous Q1 Hr x 4  . sodium chloride  10-40 mL  Intracatheter Q12H  . Tamsulosin HCl  0.4 mg Oral Daily  . traZODone  50 mg Oral QHS  . ziprasidone  40 mg Oral BID WC  . DISCONTD: enoxaparin (LOVENOX) injection  30 mg Subcutaneous Q24H   Assessment: Pt currently on  day #8 Vancomycin, day#7 diflucan, day#3 Levaquin for aspiration PNA and enterococcal UTI.   Pt has acute on chronic renal failure.  Medications adjusted.   Vancomycin random level remains elevated. Unable to calculate dosing interval since vancomycin dose was given after 5/29 level drawn.  SCr slightly improved  Goal of Therapy:  Vancomycin trough level 15-20 Eradicate infection.  Plan: 1) Re-check random vancomycin level tomorrow am 2) Monitor SCr dailiy for now 3) Redose vancomycin when level <20 4) Increase Levaquin to 750mg  IV Q48hrs per dosing guidelines 5) Increase Lovenox 40mg  sq daily for CrCl >30  Biagio Borg 02/01/2012,11:07 AM

## 2012-02-01 NOTE — Progress Notes (Signed)
Patient ID: [name], male   DOB: February 23, 1961, 51 y.o.   MRN: [id number]

## 2012-02-01 NOTE — Progress Notes (Signed)
Subjective: Interval History: none.  Objective: Vital signs in last 24 hours: Temp:  [96.6 F (35.9 C)-97.6 F (36.4 C)] 97.4 F (36.3 C) (05/31 0400) Pulse Rate:  [72-94] 79  (05/31 0615) Resp:  [14-24] 14  (05/31 0615) BP: (100-156)/(48-77) 130/65 mmHg (05/31 0615) SpO2:  [96 %-100 %] 100 % (05/31 0720) FiO2 (%):  [39.8 %-99.9 %] 45 % (05/31 0720) Weight:  [79.2 kg (174 lb 9.7 oz)] 79.2 kg (174 lb 9.7 oz) (05/31 0500) Weight change:   Intake/Output from previous day: 05/30 0701 - 05/31 0700 In: 3271.9 [I.V.:2471.9; NG/GT:500; IV Piggyback:300] Out: 1685 [Urine:1625] Intake/Output this shift:    General appearance: alert and no distress Resp: diminished breath sounds bilaterally and rhonchi bilaterally Cardio: regular rate and rhythm, S1, S2 normal, no murmur, click, rub or gallop GI: soft, non-tender; bowel sounds normal; no masses,  no organomegaly Extremities: extremities normal, atraumatic, no cyanosis or edema  Lab Results:  [facility] 02/01/12 0353 01/31/12 0411  WBC 21.6* 24.6*  HGB 8.5* 8.6*  HCT 24.5* 25.0*  PLT 209 265   BMET:  Basename 02/01/12 0351 01/31/12 1955  NA 136 134*  K 3.3* 3.5  CL 100 100  CO2 25 22  GLUCOSE 165* 198*  BUN 60* 58*  CREATININE 2.71* 2.76*  CALCIUM 8.8 8.8   No results found for this basename: PTH:2 in the last 72 hours Iron Studies: No results found for this basename: IRON,TIBC,TRANSFERRIN,FERRITIN in the last 72 hours  Studies/Results: US Renal  01/30/2012  *RADIOLOGY REPORT*  Clinical Data: 51 year old male with renal failure.  History of diabetes.  RENAL/URINARY TRACT ULTRASOUND COMPLETE  Comparison:  01/23/2012.  CT abdomen and pelvis 09/19/2011 and earlier.  Findings:  Right Kidney:  No hydronephrosis.  Renal length 10.3 cm (previously 11.2 cm).  Corticomedullary differentiation is preserved, but cortical echotexture appears increased compared to the liver. Trace perinephric fluid.  Left Kidney:  No hydronephrosis.  Renal  length 10.8 cm (previously 10.5 cm).  Corticomedullary differentiation is preserved the renal cortical echotexture is increased.  Trace perinephric fluid.  Bladder:  Decompressed, Foley catheter reportedly in place.  IMPRESSION: No obstructive uropathy.  Trace bilateral perinephric fluid is nonspecific.  Bilateral renal cortical echotexture appears increased suggesting sequelae of medical renal disease.  Original Report Authenticated By: Randall An, M.D.   Portable Chest Xray In Am  01/31/2012  *RADIOLOGY REPORT*  Clinical Data: The toric failure  PORTABLE CHEST - 1 VIEW  Comparison: 01/30/2012  Findings: The endotracheal tube, nasogastric tube and left subclavian CVP are all unchanged in position. The tip of the central line is directed against the lateral wall of the superior vena cava and this could be advanced slightly or withdrawn slightly to ensure placement within the vascular lumen.  The cardiomediastinal silhouette is stable.  The lung fields demonstrate bilateral coarse alveolar infiltrates with probable small right pleural effusion. This pattern shows little interval change since the previous exam and this appearance can be seen with noncardiogenic pulmonary edema, diffuse pneumonia or ARDS.  IMPRESSION: Stable cardiopulmonary appearance with unchanged bilateral diffuse alveolar infiltrates and small right pleural effusion.  The positioning of the peripheral central venous catheter with the tip directed towards the lateral wall of the superior vena cava can be corrected with a slight interval advancement or retraction of the catheter.  Because of the catheter placement, this report was designated as a call report These results will be called to the ordering clinician or representative by the Radiologist Assistant, and communication documented  in the PACS Dashboard.  Original Report Authenticated By: Ander Gaster, M.D.   Dg Chest Port 1 View  01/30/2012  *RADIOLOGY REPORT*  Clinical Data:  Central line placement.  PORTABLE CHEST - 1 VIEW  Comparison: 01/30/2012 at 0937 hours.  Findings: Interval placement of a left subclavian central line with tip projecting over the SVC.  No pneumothorax.  Remainder of support apparatus is stable in position.  Severe diffuse bilateral air space disease is unchanged.  Tiny right pleural effusion.  IMPRESSION:  1.  Left subclavian central line placement without pneumothorax. 2.  Severe diffuse bilateral air space disease and tiny right pleural effusion.  Original Report Authenticated By: Luretha Rued, M.D.   Dg Chest Port 1 View  01/30/2012  *RADIOLOGY REPORT*  Clinical Data: Endotracheal tube placement.  PORTABLE CHEST - 1 VIEW  Comparison: 01/30/2012 at 0425 hours.  Findings: Endotracheal tube is in satisfactory position. Nasogastric tube is followed into the stomach.  Heart size normal. There is severe diffuse bilateral air space disease, with possible slight interval improvement, especially in the right upper lobe. Tiny right pleural effusion.  IMPRESSION:  1.  Endotracheal tube is in satisfactory position. 2.  There is severe diffuse bilateral air space disease, with minimal interval improvement in aeration, especially in the right upper lobe. 3.  Tiny right pleural effusion.  Original Report Authenticated By: Luretha Rued, M.D.    I have reviewed the patient'Kennedy current medications.  Assessment/Plan: Problem #1 renal failure acute on chronic his BUN and creatinine this moment seems to be improving. Patient presently none oliguric. Problem #2 respiratory failure patient remains intubated. Problem #3 history of sepsis patient on antibiotics presently a febrile his white blood cell count is improving. Problem #4 history of hypokalemia most likely secondary to diuretics Problem #5 history of mental retardation Problem #6 history of diabetes Problem #7 history of BPH  Problem #8 history of metabolic acidosis patient is on sodium bicarbonate her  CO2 has improved. Plan: We'll change her IV fluid  normal saline with KCl 20 mEq 100 cc per hour Will follow her BMET and follow other labs.   LOS: 11 days   [name] 02/01/2012,7:44 AM    He he he hehe

## 2012-02-01 NOTE — Clinical Social Work Note (Signed)
CSW met with [facility] with DSS who reports they received emergency guardianship on pt.  Information submitted to [place] to verify spelling of pt's name.  CSW will follow up on Monday to determine appropriate level of care.  Pt will not be returning to [facility] at d/c per [facility] as his medical conditions were not managed well. [name] requested for CSW to have [facility] assess pt on Monday.  CSW will call [facility] today.   [name], [place]

## 2012-02-01 NOTE — Progress Notes (Signed)
Subjective: He is a little more alert. We are attempting weaning some today. He still has a markedly abnormal chest x-ray I think probably is from aspiration. His blood pressure is better and he has overall improved. His blood gas shows that he is much less acidotic but he is on a sodium bicarbonate drip  Objective: Vital signs in last 24 hours: Temp:  [96.6 F (35.9 C)-97.6 F (36.4 C)] 97.4 F (36.3 C) (05/31 0400) Pulse Rate:  [72-94] 79  (05/31 0615) Resp:  [14-24] 14  (05/31 0615) BP: (100-156)/(48-77) 130/65 mmHg (05/31 0615) SpO2:  [96 %-100 %] 100 % (05/31 0720) FiO2 (%):  [39.8 %-99.9 %] 45 % (05/31 0720) Weight:  [79.2 kg (174 lb 9.7 oz)] 79.2 kg (174 lb 9.7 oz) (05/31 0500) Weight change:  Last BM Date: 01/31/12  Intake/Output from previous day: 05/30 0701 - 05/31 0700 In: 3271.9 [I.V.:2471.9; NG/GT:500; IV Piggyback:300] Out: [id number] [Urine:1625]  PHYSICAL EXAM General appearance: no distress and More alert but still intubated and sedated Resp: rhonchi bilaterally Cardio: regular rate and rhythm, S1, S2 normal, no murmur, click, rub or gallop GI: soft, non-tender; bowel sounds normal; no masses,  no organomegaly Extremities: extremities normal, atraumatic, no cyanosis or edema  Lab Results:    Basic Metabolic Panel:  Basename 02/01/12 0351 01/31/12 1955  NA 136 134*  K 3.3* 3.5  CL 100 100  CO2 25 22  GLUCOSE 165* 198*  BUN 60* 58*  CREATININE 2.71* 2.76*  CALCIUM 8.8 8.8  MG -- --  PHOS -- --   Liver Function Tests: No results found for this basename: AST:2,ALT:2,ALKPHOS:2,BILITOT:2,PROT:2,ALBUMIN:2 in the last 72 hours No results found for this basename: LIPASE:2,AMYLASE:2 in the last 72 hours No results found for this basename: AMMONIA:2 in the last 72 hours CBC:  Basename 02/01/12 0353 01/31/12 0411  WBC 21.6* 24.6*  NEUTROABS -- --  HGB 8.5* 8.6*  HCT 24.5* 25.0*  MCV 79.5 80.6  PLT 209 265   Cardiac Enzymes: No results found for this  basename: CKTOTAL:3,CKMB:3,CKMBINDEX:3,TROPONINI:3 in the last 72 hours BNP: No results found for this basename: PROBNP:3 in the last 72 hours D-Dimer: No results found for this basename: DDIMER:2 in the last 72 hours CBG:  Basename 02/01/12 0604 02/01/12 0457 02/01/12 0351 02/01/12 0246 02/01/12 0144 02/01/12 0034  GLUCAP 144* 156* 175* 163* 168* 151*   Hemoglobin A1C: No results found for this basename: HGBA1C in the last 72 hours Fasting Lipid Panel: No results found for this basename: CHOL,HDL,LDLCALC,TRIG,CHOLHDL,LDLDIRECT in the last 72 hours Thyroid Function Tests: No results found for this basename: TSH,T4TOTAL,FREET4,T3FREE,THYROIDAB in the last 72 hours Anemia Panel: No results found for this basename: VITAMINB12,FOLATE,FERRITIN,TIBC,IRON,RETICCTPCT in the last 72 hours Coagulation: No results found for this basename: LABPROT:2,INR:2 in the last 72 hours Urine Drug Screen: Drugs of Abuse  No results found for this basename: labopia, cocainscrnur, labbenz, amphetmu, thcu, labbarb    Alcohol Level: No results found for this basename: ETH:2 in the last 72 hours Urinalysis: No results found for this basename: COLORURINE:2,APPERANCEUR:2,LABSPEC:2,PHURINE:2,GLUCOSEU:2,HGBUR:2,BILIRUBINUR:2,KETONESUR:2,PROTEINUR:2,UROBILINOGEN:2,NITRITE:2,LEUKOCYTESUR:2 in the last 72 hours Misc. Labs:  ABGS  Basename 02/01/12 0440  PHART 7.376  PO2ART 119.0*  TCO2 21.2  HCO3 24.6*   CULTURES Recent Results (from the past 240 hour(s))  MRSA PCR SCREENING     Status: Abnormal   Collection Time   01/22/12  9:35 AM      Component Value Range Status Comment   MRSA by PCR INVALID RESULTS, SPECIMEN SENT FOR CULTURE (*) NEGATIVE  Final   MRSA CULTURE     Status: Normal   Collection Time   01/22/12  9:35 AM      Component Value Range Status Comment   Specimen Description NOSE NASAL SWAB   Final    Special Requests NONE   Final    Culture     Final    Value: NO STAPHYLOCOCCUS AUREUS  ISOLATED     Note: NOMRSA   Report Status 01/24/2012 FINAL   Final   CULTURE, BLOOD (ROUTINE X 2)     Status: Normal   Collection Time   01/24/12  4:59 PM      Component Value Range Status Comment   Specimen Description BLOOD RIGHT HAND   Final    Special Requests BOTTLES DRAWN AEROBIC AND ANAEROBIC 8CC   Final    Culture NO GROWTH 5 DAYS   Final    Report Status 01/29/2012 FINAL   Final   CULTURE, BLOOD (ROUTINE X 2)     Status: Normal   Collection Time   01/24/12  5:03 PM      Component Value Range Status Comment   Specimen Description BLOOD LEFT HAND   Final    Special Requests BOTTLES DRAWN AEROBIC AND ANAEROBIC 8CC   Final    Culture NO GROWTH 5 DAYS   Final    Report Status 01/29/2012 FINAL   Final   CLOSTRIDIUM DIFFICILE BY PCR     Status: Normal   Collection Time   01/27/12  9:00 PM      Component Value Range Status Comment   C difficile by pcr NEGATIVE  NEGATIVE  Final   CULTURE, BLOOD (ROUTINE X 2)     Status: Normal (Preliminary result)   Collection Time   01/30/12 10:24 AM      Component Value Range Status Comment   Specimen Description BLOOD RIGHT HAND   Final    Special Requests BOTTLES DRAWN AEROBIC AND ANAEROBIC 6CC    Final    Culture NO GROWTH 2 DAYS   Final    Report Status PENDING   Incomplete   CULTURE, BLOOD (ROUTINE X 2)     Status: Normal (Preliminary result)   Collection Time   01/30/12 10:44 AM      Component Value Range Status Comment   Specimen Description BLOOD LEFT HAND   Final    Special Requests BOTTLES DRAWN AEROBIC AND ANAEROBIC 5CC   Final    Culture NO GROWTH 2 DAYS   Final    Report Status PENDING   Incomplete    Studies/Results: US Renal  01/30/2012  *RADIOLOGY REPORT*  Clinical Data: 51 year old male with renal failure.  History of diabetes.  RENAL/URINARY TRACT ULTRASOUND COMPLETE  Comparison:  01/23/2012.  CT abdomen and pelvis 09/19/2011 and earlier.  Findings:  Right Kidney:  No hydronephrosis.  Renal length 10.3 cm (previously 11.2  cm).  Corticomedullary differentiation is preserved, but cortical echotexture appears increased compared to the liver. Trace perinephric fluid.  Left Kidney:  No hydronephrosis.  Renal length 10.8 cm (previously 10.5 cm).  Corticomedullary differentiation is preserved the renal cortical echotexture is increased.  Trace perinephric fluid.  Bladder:  Decompressed, Foley catheter reportedly in place.  IMPRESSION: No obstructive uropathy.  Trace bilateral perinephric fluid is nonspecific.  Bilateral renal cortical echotexture appears increased suggesting sequelae of medical renal disease.  Original Report Authenticated By: Randall An, M.D.   Portable Chest Xray In Am  01/31/2012  *RADIOLOGY REPORT*  Clinical Data:  The toric failure  PORTABLE CHEST - 1 VIEW  Comparison: 01/30/2012  Findings: The endotracheal tube, nasogastric tube and left subclavian CVP are all unchanged in position. The tip of the central line is directed against the lateral wall of the superior vena cava and this could be advanced slightly or withdrawn slightly to ensure placement within the vascular lumen.  The cardiomediastinal silhouette is stable.  The lung fields demonstrate bilateral coarse alveolar infiltrates with probable small right pleural effusion. This pattern shows little interval change since the previous exam and this appearance can be seen with noncardiogenic pulmonary edema, diffuse pneumonia or ARDS.  IMPRESSION: Stable cardiopulmonary appearance with unchanged bilateral diffuse alveolar infiltrates and small right pleural effusion.  The positioning of the peripheral central venous catheter with the tip directed towards the lateral wall of the superior vena cava can be corrected with a slight interval advancement or retraction of the catheter.  Because of the catheter placement, this report was designated as a call report These results will be called to the ordering clinician or representative by the Radiologist Assistant,  and communication documented in the PACS Dashboard.  Original Report Authenticated By: Ander Gaster, M.D.   Dg Chest Port 1 View  01/30/2012  *RADIOLOGY REPORT*  Clinical Data: Central line placement.  PORTABLE CHEST - 1 VIEW  Comparison: 01/30/2012 at 0937 hours.  Findings: Interval placement of a left subclavian central line with tip projecting over the SVC.  No pneumothorax.  Remainder of support apparatus is stable in position.  Severe diffuse bilateral air space disease is unchanged.  Tiny right pleural effusion.  IMPRESSION:  1.  Left subclavian central line placement without pneumothorax. 2.  Severe diffuse bilateral air space disease and tiny right pleural effusion.  Original Report Authenticated By: Luretha Rued, M.D.   Dg Chest Port 1 View  01/30/2012  *RADIOLOGY REPORT*  Clinical Data: Endotracheal tube placement.  PORTABLE CHEST - 1 VIEW  Comparison: 01/30/2012 at 0425 hours.  Findings: Endotracheal tube is in satisfactory position. Nasogastric tube is followed into the stomach.  Heart size normal. There is severe diffuse bilateral air space disease, with possible slight interval improvement, especially in the right upper lobe. Tiny right pleural effusion.  IMPRESSION:  1.  Endotracheal tube is in satisfactory position. 2.  There is severe diffuse bilateral air space disease, with minimal interval improvement in aeration, especially in the right upper lobe. 3.  Tiny right pleural effusion.  Original Report Authenticated By: Luretha Rued, M.D.    Medications:  Scheduled:   . albuterol  2.5 mg Nebulization Q4H   And  . ipratropium  0.5 mg Nebulization Q4H  . antiseptic oral rinse  15 mL Mouth Rinse QID  . chlorhexidine  15 mL Mouth Rinse BID  . enoxaparin (LOVENOX) injection  30 mg Subcutaneous Q24H  . famotidine  20 mg Oral Daily  . feeding supplement  237 mL Oral BID BM  . fluconazole  100 mg Oral Daily  . furosemide  60 mg Intravenous BID  . hydrocortisone sodium  succinate  50 mg Intravenous Q6H  . insulin regular  0-10 Units Intravenous TID WC  . levofloxacin (LEVAQUIN) IV  500 mg Intravenous Q48H  . midazolam  2-4 mg Intravenous Once  . pantoprazole (PROTONIX) IV  40 mg Intravenous Daily  . polyethylene glycol  17 g Oral q morning - 10a  . potassium chloride  10 mEq Intravenous Q1 Hr x 4  . sodium chloride  10-40 mL Intracatheter  Q12H  . Tamsulosin HCl  0.4 mg Oral Daily  . traZODone  50 mg Oral QHS  . ziprasidone  40 mg Oral BID WC  . DISCONTD: insulin aspart  0-15 Units Subcutaneous Q4H   Continuous:   . feeding supplement (GLUCERNA 1.2 CAL) 1,000 mL (01/31/12 1430)  . fentaNYL infusion INTRAVENOUS    . insulin (NOVOLIN-R) infusion 4.2 Units/hr (02/01/12 [id number])  . midazolam (VERSED) infusion    .  sodium bicarbonate infusion 1/4 NS 1000 mL 100 mL/hr at 02/01/12 0555  . DISCONTD: dextrose 5 % and 0.45% NaCl 50 mL/hr at 01/30/12 2000   [id number], ALPRAZolam, alum & mag hydroxide-simeth, dextrose, DOBUTamine, fentaNYL, fentaNYL, hyoscyamine, midazolam, midazolam, midazolam, morphine injection, norepinephrine (LEVOPHED) Adult infusion, ondansetron, sodium chloride, sodium chloride  Assesment: He has bilateral pneumonia. He was septic. He had acute on chronic renal failure. He was markedly hyperglycemic and has been on insulin drip. He is making slow improvement in all of these parameters. Active Problems:  * No active hospital problems. *     Plan: Continue attempts to wean but I don't think he can be extubated today.    LOS: 11 days   [name] 02/01/2012, 7:53 AM

## 2012-02-02 ENCOUNTER — Inpatient Hospital Stay (HOSPITAL_COMMUNITY): Payer: Medicaid Other

## 2012-02-02 LAB — VANCOMYCIN, RANDOM: Vancomycin Rm: 30.4 ug/mL

## 2012-02-02 LAB — GLUCOSE, CAPILLARY
Glucose-Capillary: 140 mg/dL — ABNORMAL HIGH (ref 70–99)
Glucose-Capillary: 172 mg/dL — ABNORMAL HIGH (ref 70–99)
Glucose-Capillary: 237 mg/dL — ABNORMAL HIGH (ref 70–99)
Glucose-Capillary: 282 mg/dL — ABNORMAL HIGH (ref 70–99)
Glucose-Capillary: 387 mg/dL — ABNORMAL HIGH (ref 70–99)

## 2012-02-02 LAB — BLOOD GAS, ARTERIAL
Bicarbonate: 24.8 mEq/L — ABNORMAL HIGH (ref 20.0–24.0)
O2 Saturation: 92.7 %
PEEP: 5 cmH2O
Patient temperature: 37
RATE: 20 resp/min
pH, Arterial: 7.317 — ABNORMAL LOW (ref 7.350–7.450)

## 2012-02-02 LAB — BASIC METABOLIC PANEL
BUN: 81 mg/dL — ABNORMAL HIGH (ref 6–23)
CO2: 26 mEq/L (ref 19–32)
Calcium: 8.4 mg/dL (ref 8.4–10.5)
Chloride: 99 mEq/L (ref 96–112)
Creatinine, Ser: 2.79 mg/dL — ABNORMAL HIGH (ref 0.50–1.35)
GFR calc non Af Amer: 25 mL/min — ABNORMAL LOW (ref >90)
Glucose, Bld: 167 mg/dL — ABNORMAL HIGH (ref 70–99)
Potassium: 3.7 mEq/L (ref 3.5–5.1)
Sodium: 136 mEq/L (ref 135–145)

## 2012-02-02 LAB — COMPREHENSIVE METABOLIC PANEL
AST: 26 U/L (ref 0–37)
Albumin: 1.8 g/dL — ABNORMAL LOW (ref 3.5–5.2)
BUN: 76 mg/dL — ABNORMAL HIGH (ref 6–23)
Calcium: 8.6 mg/dL (ref 8.4–10.5)
Creatinine, Ser: 2.79 mg/dL — ABNORMAL HIGH (ref 0.50–1.35)
Total Protein: 5.8 g/dL — ABNORMAL LOW (ref 6.0–8.3)

## 2012-02-02 LAB — CBC
Hemoglobin: 8.4 g/dL — ABNORMAL LOW (ref 13.0–17.0)
MCH: 27.7 pg (ref 26.0–34.0)
MCV: 81.2 fL (ref 78.0–100.0)
RBC: 3.03 MIL/uL — ABNORMAL LOW (ref 4.22–5.81)

## 2012-02-02 LAB — DIFFERENTIAL
Eosinophils Absolute: 0 10*3/uL (ref 0.0–0.7)
Eosinophils Relative: 0 % (ref 0–5)
Lymphs Abs: 0.3 10*3/uL — ABNORMAL LOW (ref 0.7–4.0)
Monocytes Relative: 4 % (ref 3–12)
Neutrophils Relative %: 94 % — ABNORMAL HIGH (ref 43–77)

## 2012-02-02 MED ORDER — INSULIN ASPART 100 UNIT/ML ~~LOC~~ SOLN
0.0000 [IU] | SUBCUTANEOUS | Status: DC
  Administered 2012-02-02: 3 [IU] via SUBCUTANEOUS
  Administered 2012-02-02: 2 [IU] via SUBCUTANEOUS
  Administered 2012-02-03 – 2012-02-04 (×3): 3 [IU] via SUBCUTANEOUS
  Administered 2012-02-04: 2 [IU] via SUBCUTANEOUS
  Administered 2012-02-04: 5 [IU] via SUBCUTANEOUS
  Administered 2012-02-04: 3 [IU] via SUBCUTANEOUS
  Administered 2012-02-04: 0 [IU] via SUBCUTANEOUS
  Administered 2012-02-05: 3 [IU] via SUBCUTANEOUS
  Administered 2012-02-05: 2 [IU] via SUBCUTANEOUS
  Administered 2012-02-05: 0 [IU] via SUBCUTANEOUS
  Administered 2012-02-05: 5 [IU] via SUBCUTANEOUS
  Administered 2012-02-06: 0 [IU] via SUBCUTANEOUS
  Administered 2012-02-06 – 2012-02-07 (×3): 3 [IU] via SUBCUTANEOUS
  Administered 2012-02-07: 8 [IU] via SUBCUTANEOUS
  Administered 2012-02-07 – 2012-02-08 (×2): 5 [IU] via SUBCUTANEOUS
  Administered 2012-02-08: 3 [IU] via SUBCUTANEOUS
  Administered 2012-02-08 – 2012-02-09 (×4): 5 [IU] via SUBCUTANEOUS
  Administered 2012-02-09: 8 [IU] via SUBCUTANEOUS
  Administered 2012-02-09 (×3): 2 [IU] via SUBCUTANEOUS
  Administered 2012-02-09: 3 [IU] via SUBCUTANEOUS
  Administered 2012-02-10: 2 [IU] via SUBCUTANEOUS
  Administered 2012-02-10 (×2): 3 [IU] via SUBCUTANEOUS
  Administered 2012-02-11: 5 [IU] via SUBCUTANEOUS
  Administered 2012-02-11: 2 [IU] via SUBCUTANEOUS
  Administered 2012-02-11: 5 [IU] via SUBCUTANEOUS
  Administered 2012-02-11 (×2): 2 [IU] via SUBCUTANEOUS
  Administered 2012-02-12: 11 [IU] via SUBCUTANEOUS
  Administered 2012-02-12: 8 [IU] via SUBCUTANEOUS
  Administered 2012-02-12: 3 [IU] via SUBCUTANEOUS
  Administered 2012-02-13: 2 [IU] via SUBCUTANEOUS
  Administered 2012-02-13: 3 [IU] via SUBCUTANEOUS
  Administered 2012-02-13: 2 [IU] via SUBCUTANEOUS
  Administered 2012-02-13: 5 [IU] via SUBCUTANEOUS
  Administered 2012-02-14 (×2): 2 [IU] via SUBCUTANEOUS
  Administered 2012-02-14: 5 [IU] via SUBCUTANEOUS
  Administered 2012-02-14: 2 [IU] via SUBCUTANEOUS
  Administered 2012-02-15: 3 [IU] via SUBCUTANEOUS
  Administered 2012-02-15 (×2): 11 [IU] via SUBCUTANEOUS

## 2012-02-02 NOTE — Progress Notes (Signed)
Subjective: He is overall about the same. He remains intubated on the ventilator. He is off the insulin drip and off the bicarbonate drip. His renal function appears to have stabilized but is still abnormal. His chest x-ray still shows marked abnormalities particularly on left  Objective: Vital signs in last 24 hours: Temp:  [96.6 F (35.9 C)-97.6 F (36.4 C)] 97.6 F (36.4 C) (06/01 0400) Pulse Rate:  [68-89] 74  (06/01 0630) Resp:  [15-26] 20  (06/01 0630) BP: (109-157)/(51-99) 114/57 mmHg (06/01 0630) SpO2:  [97 %-100 %] 100 % (06/01 0756) FiO2 (%):  [39.4 %-99.2 %] 40 % (06/01 0756) Weight change:  Last BM Date: 01/31/12  Intake/Output from previous day: 05/31 0701 - 06/01 0700 In: 3734.8 [I.V.:2410.8; NG/GT:1150; IV Piggyback:174] Out: 2300 [Urine:2300]  PHYSICAL EXAM General appearance: no distress and Intubated and sedated Resp: rhonchi bilaterally Cardio: regular rate and rhythm, S1, S2 normal, no murmur, click, rub or gallop GI: soft, non-tender; bowel sounds normal; no masses,  no organomegaly Extremities: extremities normal, atraumatic, no cyanosis or edema  Lab Results:    Basic Metabolic Panel:  Basename 02/02/12 0458 02/01/12 2011  NA 134* 130*  K 3.6 3.8  CL 99 95*  CO2 24 21  GLUCOSE 129* 454*  BUN 76* 72*  CREATININE 2.79* 2.72*  CALCIUM 8.6 8.5  MG -- --  PHOS -- --   Liver Function Tests:  Basename 02/02/12 0458  AST 26  ALT 22  ALKPHOS 149*  BILITOT 0.3  PROT 5.8*  ALBUMIN 1.8*   No results found for this basename: LIPASE:2,AMYLASE:2 in the last 72 hours No results found for this basename: AMMONIA:2 in the last 72 hours CBC:  Basename 02/01/12 0845 02/01/12 0353  WBC 18.6* 21.6*  NEUTROABS 17.8* --  HGB 8.6* 8.5*  HCT 25.2* 24.5*  MCV 79.7 79.5  PLT 208 209   Cardiac Enzymes: No results found for this basename: CKTOTAL:3,CKMB:3,CKMBINDEX:3,TROPONINI:3 in the last 72 hours BNP: No results found for this basename: PROBNP:3 in  the last 72 hours D-Dimer: No results found for this basename: DDIMER:2 in the last 72 hours CBG:  Basename 02/02/12 0726 02/02/12 0605 02/02/12 0458 02/02/12 0350 02/02/12 0247 02/02/12 0144  GLUCAP 115* 79 130* 140* 172* 237*   Hemoglobin A1C: No results found for this basename: HGBA1C in the last 72 hours Fasting Lipid Panel: No results found for this basename: CHOL,HDL,LDLCALC,TRIG,CHOLHDL,LDLDIRECT in the last 72 hours Thyroid Function Tests: No results found for this basename: TSH,T4TOTAL,FREET4,T3FREE,THYROIDAB in the last 72 hours Anemia Panel: No results found for this basename: VITAMINB12,FOLATE,FERRITIN,TIBC,IRON,RETICCTPCT in the last 72 hours Coagulation: No results found for this basename: LABPROT:2,INR:2 in the last 72 hours Urine Drug Screen: Drugs of Abuse  No results found for this basename: labopia, cocainscrnur, labbenz, amphetmu, thcu, labbarb    Alcohol Level: No results found for this basename: ETH:2 in the last 72 hours Urinalysis: No results found for this basename: COLORURINE:2,APPERANCEUR:2,LABSPEC:2,PHURINE:2,GLUCOSEU:2,HGBUR:2,BILIRUBINUR:2,KETONESUR:2,PROTEINUR:2,UROBILINOGEN:2,NITRITE:2,LEUKOCYTESUR:2 in the last 72 hours Misc. Labs:  ABGS  Basename 02/02/12 0540  PHART 7.317*  PO2ART 69.0*  TCO2 24.0  HCO3 24.8*   CULTURES Recent Results (from the past 240 hour(s))  CULTURE, BLOOD (ROUTINE X 2)     Status: Normal   Collection Time   01/24/12  4:59 PM      Component Value Range Status Comment   Specimen Description BLOOD RIGHT HAND   Final    Special Requests BOTTLES DRAWN AEROBIC AND ANAEROBIC 8CC   Final    Culture NO GROWTH  5 DAYS   Final    Report Status 01/29/2012 FINAL   Final   CULTURE, BLOOD (ROUTINE X 2)     Status: Normal   Collection Time   01/24/12  5:03 PM      Component Value Range Status Comment   Specimen Description BLOOD LEFT HAND   Final    Special Requests BOTTLES DRAWN AEROBIC AND ANAEROBIC 8CC   Final    Culture NO  GROWTH 5 DAYS   Final    Report Status 01/29/2012 FINAL   Final   CLOSTRIDIUM DIFFICILE BY PCR     Status: Normal   Collection Time   01/27/12  9:00 PM      Component Value Range Status Comment   C difficile by pcr NEGATIVE  NEGATIVE  Final   CULTURE, BLOOD (ROUTINE X 2)     Status: Normal (Preliminary result)   Collection Time   01/30/12 10:24 AM      Component Value Range Status Comment   Specimen Description BLOOD RIGHT HAND   Final    Special Requests BOTTLES DRAWN AEROBIC AND ANAEROBIC 6CC    Final    Culture NO GROWTH 2 DAYS   Final    Report Status PENDING   Incomplete   CULTURE, BLOOD (ROUTINE X 2)     Status: Normal (Preliminary result)   Collection Time   01/30/12 10:44 AM      Component Value Range Status Comment   Specimen Description BLOOD LEFT HAND   Final    Special Requests BOTTLES DRAWN AEROBIC AND ANAEROBIC 5CC   Final    Culture NO GROWTH 2 DAYS   Final    Report Status PENDING   Incomplete    Studies/Results: Dg Chest Port 1 View  02/02/2012  *RADIOLOGY REPORT*  Clinical Data: Respiratory failure.  PORTABLE CHEST - 1 VIEW  Comparison: Chest 02/01/2012 and 01/31/2012.  Findings: Support tubes and lines are unchanged.  Extensive bilateral airspace disease, worse on the left, persists but has improved over the past 2 days.  Heart size normal.  IMPRESSION: Improved left worse than right airspace disease.  Original Report Authenticated By: Arvid Right. Luther Parody, M.D.   Dg Chest Port 1 View  02/01/2012  *RADIOLOGY REPORT*  Clinical Data: Ventilator dependent respiratory failure.  PORTABLE CHEST - 1 VIEW  Comparison: 01/31/2012  Findings: Support lines and tubes remain in appropriate position. Diffuse bilateral pulmonary air space disease shows no significant change allowing for differences in radiographic technique.  Heart size remains within normal limits.  IMPRESSION: Diffuse bilateral airspace disease, without significant interval change.  Original Report Authenticated By:  Marlaine Hind, M.D.    Medications:  Scheduled:   . albuterol  2.5 mg Nebulization Q4H   And  . ipratropium  0.5 mg Nebulization Q4H  . antiseptic oral rinse  15 mL Mouth Rinse QID  . chlorhexidine  15 mL Mouth Rinse BID  . enoxaparin (LOVENOX) injection  40 mg Subcutaneous Q24H  . famotidine  20 mg Oral Daily  . fluconazole  100 mg Oral Daily  . furosemide  60 mg Intravenous BID  . hydrocortisone sodium succinate  50 mg Intravenous Q6H  . insulin aspart  15 Units Subcutaneous Once  . insulin NPH  30 Units Subcutaneous Q8H  . insulin regular  0-10 Units Intravenous TID WC  . levofloxacin (LEVAQUIN) IV  750 mg Intravenous Q48H  . midazolam  2-4 mg Intravenous Once  . pantoprazole (PROTONIX) IV  40 mg Intravenous Daily  .  polyethylene glycol  17 g Oral q morning - 10a  . sodium chloride  10-40 mL Intracatheter Q12H  . Tamsulosin HCl  0.4 mg Oral Daily  . traZODone  50 mg Oral QHS  . ziprasidone  40 mg Oral BID WC  . DISCONTD: enoxaparin (LOVENOX) injection  30 mg Subcutaneous Q24H  . DISCONTD: feeding supplement  237 mL Oral BID BM  . DISCONTD: insulin NPH  20 Units Subcutaneous Q8H  . DISCONTD: insulin regular  0-10 Units Intravenous TID WC  . DISCONTD: levofloxacin (LEVAQUIN) IV  500 mg Intravenous Q48H   Continuous:   . 0.45 % NaCl with KCl 20 mEq / L 100 mL/hr at 02/02/12 0600  . sodium chloride 20 mL/hr at 02/01/12 2002  . dextrose 5 % and 0.45% NaCl 20 mL/hr at 02/02/12 0600  . feeding supplement (GLUCERNA 1.2 CAL) 1,000 mL (02/01/12 1520)  . fentaNYL infusion INTRAVENOUS 75 mcg/hr (02/02/12 0600)  . insulin (NOVOLIN-R) infusion 7.4 Units/hr (02/01/12 2130)  . midazolam (VERSED) infusion 2 mg/hr (02/02/12 0600)   [id number], ALPRAZolam, alum & mag hydroxide-simeth, dextrose, dextrose, DOBUTamine, fentaNYL, fentaNYL, hyoscyamine, midazolam, midazolam, midazolam, morphine injection, norepinephrine (LEVOPHED) Adult infusion, ondansetron, sodium chloride, sodium  chloride  Assesment: He has continued problems with respiratory failure. He had severe problems with glucose metabolism that is better. He was markedly acidotic from sepsis and from his blood sugar and renal dysfunction and that is better. He has acute on chronic renal failure which is stabilizing. He has bilateral pneumonia which does not appear to have changed very much. He has chronic mental status problems. Active Problems:  * No active hospital problems. *     Plan: Attempt weaning again today I do not think is going to be able to be extubated however.    LOS: 12 days   Labradford Schnitker L 02/02/2012, 8:46 AM

## 2012-02-02 NOTE — Progress Notes (Signed)
Subjective: Interval History: Patient  intubated and at this moment  noncommunicative.  Objective: Vital signs in last 24 hours: Temp:  [96.6 F (35.9 C)-97.6 F (36.4 C)] 97.6 F (36.4 C) (06/01 0400) Pulse Rate:  [68-89] 74  (06/01 0630) Resp:  [15-26] 20  (06/01 0630) BP: (109-157)/(51-99) 114/57 mmHg (06/01 0630) SpO2:  [97 %-100 %] 100 % (06/01 0630) FiO2 (%):  [39.4 %-99.2 %] 40.2 % (06/01 0630) Weight change:   Intake/Output from previous day: 05/31 0701 - 06/01 0700 In: 3734.8 [I.V.:2410.8; NG/GT:1150; IV Piggyback:174] Out: 2300 [Urine:2300] Intake/Output this shift:    General appearance: mild distress Resp: diminished breath sounds bilaterally Cardio: regular rate and rhythm, S1, S2 normal, no murmur, click, rub or gallop GI: soft, non-tender; bowel sounds normal; no masses,  no organomegaly Extremities: edema Trace edema  Lab Results:  Basename 02/01/12 0845 02/01/12 0353  WBC 18.6* 21.6*  HGB 8.6* 8.5*  HCT 25.2* 24.5*  PLT 208 209   BMET:  Basename 02/02/12 0458 02/01/12 2011  NA 134* 130*  K 3.6 3.8  CL 99 95*  CO2 24 21  GLUCOSE 129* 454*  BUN 76* 72*  CREATININE 2.79* 2.72*  CALCIUM 8.6 8.5   No results found for this basename: PTH:2 in the last 72 hours Iron Studies: No results found for this basename: IRON,TIBC,TRANSFERRIN,FERRITIN in the last 72 hours  Studies/Results: Dg Chest Port 1 View  02/01/2012  *RADIOLOGY REPORT*  Clinical Data: Ventilator dependent respiratory failure.  PORTABLE CHEST - 1 VIEW  Comparison: 01/31/2012  Findings: Support lines and tubes remain in appropriate position. Diffuse bilateral pulmonary air space disease shows no significant change allowing for differences in radiographic technique.  Heart size remains within normal limits.  IMPRESSION: Diffuse bilateral airspace disease, without significant interval change.  Original Report Authenticated By: Marlaine Hind, M.D.    I have reviewed the patient's current  medications.  Assessment/Plan: Problem #1 renal failure acute kidney injury his BUN is 76 and creatinine of 2.79 remains stable. Presently patient is none oliguric. Problem #2 hyponatremia sodium 134 improving Problem #3 hypokalemia is on potassium supplement potassium is 3.6. Problem #4 aspiration pneumonia patient remained intubated his white blood cell count is improving. Problem #5 anemia iron deficiency his H&H is stable Problem #6 history of BPH Problem #7 history of diabetes Problem #8 history of mental condition. Plan: We'll continue with present management including Lasix and potassium supplement. We'll follow his basic metabolic panel.    LOS: 12 days   Baylen Dea S 02/02/2012,7:57 AM

## 2012-02-02 NOTE — Consult Note (Signed)
ANTIBIOTIC CONSULT NOTE   Pharmacy Consult for Vancomycin and Levaquin Indication: PNA  No Known Allergies  Patient Measurements: Height: 5\' 10"  (177.8 cm) Weight: 174 lb 9.7 oz (79.2 kg) IBW/kg (Calculated) : 73   Vital Signs: Temp: 96.9 F (36.1 C) (06/01 0800) Temp src: Axillary (06/01 0800) BP: 134/71 mmHg (06/01 1100) Pulse Rate: 76  (06/01 1100) Intake/Output from previous day: 05/31 0701 - 06/01 0700 In: 3914.3 [I.V.:2540.3; NG/GT:1200; IV Piggyback:174] Out: 2300 [Urine:2300] Intake/Output from this shift: Total I/O In: 818 [I.V.:522; Other:90; NG/GT:200; IV Piggyback:6] Out: -   Labs:  Basename 02/02/12 1059 02/02/12 0458 02/01/12 2011 02/01/12 0845 02/01/12 0353 01/30/12 1646  WBC 17.0* -- -- 18.6* 21.6* --  HGB 8.4* -- -- 8.6* 8.5* --  PLT 219 -- -- 208 209 --  LABCREA -- -- -- -- -- 136.90  CREATININE 2.79* 2.79* 2.72* -- -- --   Estimated Creatinine Clearance: 32.7 ml/min (by C-G formula based on Cr of 2.79).  Basename 02/02/12 0458 02/01/12 0352  VANCOTROUGH -- --  VANCOPEAK -- --  VANCORANDOM 30.4 35.7  GENTTROUGH -- --  GENTPEAK -- --  Ginger Organ -- --  TOBRATROUGH -- --  TOBRAPEAK -- --  TOBRARND -- --  AMIKACINPEAK -- --  AMIKACINTROU -- --  AMIKACIN -- --    Microbiology: Recent Results (from the past 720 hour(s))  URINE CULTURE     Status: Normal   Collection Time   01/11/12 10:49 PM      Component Value Range Status Comment   Specimen Description URINE, CATHETERIZED   Final    Special Requests NONE   Final    Culture  Setup Time [id number]   Final    Colony Count >=100,000 COLONIES/ML   Final    Culture     Final    Value: Multiple bacterial morphotypes present, none predominant. Suggest appropriate recollection if clinically indicated.   Report Status 01/13/2012 FINAL   Final   URINE CULTURE     Status: Normal   Collection Time   01/21/12  3:36 PM      Component Value Range Status Comment   Specimen Description URINE,  CATHETERIZED   Final    Special Requests NONE   Final    Culture  Setup Time [id number]   Final    Colony Count >=100,000 COLONIES/ML   Final    Culture     Final    Value: ENTEROCOCCUS SPECIES     YEAST   Report Status 01/25/2012 FINAL   Final    Organism ID, Bacteria ENTEROCOCCUS SPECIES   Final   MRSA PCR SCREENING     Status: Abnormal   Collection Time   01/22/12  9:35 AM      Component Value Range Status Comment   MRSA by PCR INVALID RESULTS, SPECIMEN SENT FOR CULTURE (*) NEGATIVE  Final   MRSA CULTURE     Status: Normal   Collection Time   01/22/12  9:35 AM      Component Value Range Status Comment   Specimen Description NOSE NASAL SWAB   Final    Special Requests NONE   Final    Culture     Final    Value: NO STAPHYLOCOCCUS AUREUS ISOLATED     Note: NOMRSA   Report Status 01/24/2012 FINAL   Final   CULTURE, BLOOD (ROUTINE X 2)     Status: Normal   Collection Time   01/24/12  4:59 PM      Component  Value Range Status Comment   Specimen Description BLOOD RIGHT HAND   Final    Special Requests BOTTLES DRAWN AEROBIC AND ANAEROBIC 8CC   Final    Culture NO GROWTH 5 DAYS   Final    Report Status 01/29/2012 FINAL   Final   CULTURE, BLOOD (ROUTINE X 2)     Status: Normal   Collection Time   01/24/12  5:03 PM      Component Value Range Status Comment   Specimen Description BLOOD LEFT HAND   Final    Special Requests BOTTLES DRAWN AEROBIC AND ANAEROBIC 8CC   Final    Culture NO GROWTH 5 DAYS   Final    Report Status 01/29/2012 FINAL   Final   CLOSTRIDIUM DIFFICILE BY PCR     Status: Normal   Collection Time   01/27/12  9:00 PM      Component Value Range Status Comment   C difficile by pcr NEGATIVE  NEGATIVE  Final   CULTURE, BLOOD (ROUTINE X 2)     Status: Normal (Preliminary result)   Collection Time   01/30/12 10:24 AM      Component Value Range Status Comment   Specimen Description BLOOD RIGHT HAND   Final    Special Requests BOTTLES DRAWN AEROBIC AND ANAEROBIC 6CC     Final    Culture NO GROWTH 3 DAYS   Final    Report Status PENDING   Incomplete   CULTURE, BLOOD (ROUTINE X 2)     Status: Normal (Preliminary result)   Collection Time   01/30/12 10:44 AM      Component Value Range Status Comment   Specimen Description BLOOD LEFT HAND   Final    Special Requests BOTTLES DRAWN AEROBIC AND ANAEROBIC 5CC   Final    Culture NO GROWTH 3 DAYS   Final    Report Status PENDING   Incomplete    Medical History: Past Medical History  Diagnosis Date  . Renal disorder   . Elective mutism   . Autism   . Hyperosmolar (nonketotic) coma   . Hyperglycemia   . Diabetes mellitus   . Hyponatremia   . Mental retardation   . Urinary retention   . BPH (benign prostatic hyperplasia)   . Gastroparesis   . Depression   . Pneumonia   . UTI (urinary tract infection)   . Mental retardation    Medications:  Scheduled:     . albuterol  2.5 mg Nebulization Q4H   And  . ipratropium  0.5 mg Nebulization Q4H  . antiseptic oral rinse  15 mL Mouth Rinse QID  . chlorhexidine  15 mL Mouth Rinse BID  . enoxaparin (LOVENOX) injection  40 mg Subcutaneous Q24H  . famotidine  20 mg Oral Daily  . fluconazole  100 mg Oral Daily  . furosemide  60 mg Intravenous BID  . hydrocortisone sodium succinate  50 mg Intravenous Q6H  . insulin aspart  15 Units Subcutaneous Once  . insulin NPH  30 Units Subcutaneous Q8H  . insulin regular  0-10 Units Intravenous TID WC  . levofloxacin (LEVAQUIN) IV  750 mg Intravenous Q48H  . midazolam  2-4 mg Intravenous Once  . pantoprazole (PROTONIX) IV  40 mg Intravenous Daily  . polyethylene glycol  17 g Oral q morning - 10a  . sodium chloride  10-40 mL Intracatheter Q12H  . Tamsulosin HCl  0.4 mg Oral Daily  . traZODone  50 mg Oral QHS  .  ziprasidone  40 mg Oral BID WC  . DISCONTD: feeding supplement  237 mL Oral BID BM  . DISCONTD: insulin NPH  20 Units Subcutaneous Q8H  . DISCONTD: insulin regular  0-10 Units Intravenous TID WC    Assessment: Pt currently on day #9 Vancomycin, day#8 diflucan, day#4 Levaquin for aspiration PNA and enterococcal UTI.   Pt has acute on chronic renal failure.  Medications adjusted.   Vancomycin random level remains elevated. Estimated half-life of 100hrs, although renal function changing (Scr increased today).  Goal of Therapy:  Vancomycin trough level 15-20 Eradicate infection.  Plan: 1) Re-check random vancomycin level Monday 2) Monitor SCr daily for now 3) Redose vancomycin when level <20 4) Increase Levaquin to 750mg  IV Q48hrs per dosing guidelines 5) Increase Lovenox 40mg  sq daily for CrCl >30  Biagio Borg 02/02/2012,11:41 AM

## 2012-02-03 ENCOUNTER — Inpatient Hospital Stay (HOSPITAL_COMMUNITY): Payer: Medicaid Other

## 2012-02-03 LAB — BASIC METABOLIC PANEL
BUN: 88 mg/dL — ABNORMAL HIGH (ref 6–23)
BUN: 90 mg/dL — ABNORMAL HIGH (ref 6–23)
CO2: 26 mEq/L (ref 19–32)
CO2: 25 mEq/L (ref 19–32)
Calcium: 8.1 mg/dL — ABNORMAL LOW (ref 8.4–10.5)
Chloride: 101 mEq/L (ref 96–112)
Chloride: 99 mEq/L (ref 96–112)
Chloride: 100 mEq/L (ref 96–112)
GFR calc Af Amer: 29 mL/min — ABNORMAL LOW (ref >90)
Glucose, Bld: 115 mg/dL — ABNORMAL HIGH (ref 70–99)
Glucose, Bld: 94 mg/dL (ref 70–99)
Potassium: 3.8 mEq/L (ref 3.5–5.1)
Potassium: 3.9 mEq/L (ref 3.5–5.1)
Potassium: 3.8 mEq/L (ref 3.5–5.1)
Sodium: 136 mEq/L (ref 135–145)

## 2012-02-03 LAB — BLOOD GAS, ARTERIAL
Acid-Base Excess: 2.4 mmol/L — ABNORMAL HIGH (ref 0.0–2.0)
Bicarbonate: 27.3 mEq/L — ABNORMAL HIGH (ref 20.0–24.0)
FIO2: 0.4 %
MECHVT: 500 mL
TCO2: 26 mmol/L (ref 0–100)
pCO2 arterial: 48.6 mmHg — ABNORMAL HIGH (ref 35.0–45.0)
pO2, Arterial: 84.8 mmHg (ref 80.0–100.0)

## 2012-02-03 LAB — DIFFERENTIAL
Basophils Relative: 0 % (ref 0–1)
Eosinophils Absolute: 0 10*3/uL (ref 0.0–0.7)
Lymphs Abs: 0.5 10*3/uL — ABNORMAL LOW (ref 0.7–4.0)
Monocytes Absolute: 0.7 10*3/uL (ref 0.1–1.0)
Monocytes Relative: 5 % (ref 3–12)
Neutro Abs: 14 10*3/uL — ABNORMAL HIGH (ref 1.7–7.7)

## 2012-02-03 LAB — CBC
HCT: 25.6 % — ABNORMAL LOW (ref 39.0–52.0)
Hemoglobin: 8.6 g/dL — ABNORMAL LOW (ref 13.0–17.0)
MCH: 27.2 pg (ref 26.0–34.0)
MCHC: 33.6 g/dL (ref 30.0–36.0)
RBC: 3.16 MIL/uL — ABNORMAL LOW (ref 4.22–5.81)

## 2012-02-03 LAB — GLUCOSE, CAPILLARY
Glucose-Capillary: 85 mg/dL (ref 70–99)
Glucose-Capillary: 57 mg/dL — ABNORMAL LOW (ref 70–99)

## 2012-02-03 MED ORDER — MIDAZOLAM HCL 50 MG/10ML IJ SOLN
INTRAMUSCULAR | Status: AC
  Filled 2012-02-03: qty 1

## 2012-02-03 MED ORDER — DEXTROSE 50 % IV SOLN
INTRAVENOUS | Status: AC
  Filled 2012-02-03: qty 50

## 2012-02-03 MED ORDER — INSULIN NPH (HUMAN) (ISOPHANE) 100 UNIT/ML ~~LOC~~ SUSP
20.0000 [IU] | Freq: Three times a day (TID) | SUBCUTANEOUS | Status: DC
  Administered 2012-02-03 – 2012-02-05 (×4): 20 [IU] via SUBCUTANEOUS

## 2012-02-03 NOTE — Progress Notes (Signed)
Subjective: Interval History: Patient remains intubated  Objective: Vital signs in last 24 hours: Temp:  [96.8 F (36 C)-97.9 F (36.6 C)] 97.9 F (36.6 C) (06/02 0400) Pulse Rate:  [72-88] 82  (06/02 0645) Resp:  [11-23] 22  (06/02 0645) BP: (113-152)/(56-81) 141/67 mmHg (06/02 0645) SpO2:  [93 %-100 %] 98 % (06/02 0645) FiO2 (%):  [39.7 %-40.3 %] 40.1 % (06/02 0645) Weight:  [86.7 kg (191 lb 2.2 oz)] 86.7 kg (191 lb 2.2 oz) (06/02 0500) Weight change:   Intake/Output from previous day: 06/01 0701 - 06/02 0700 In: 4233 [I.V.:2921; NG/GT:1150; IV Piggyback:12] Out: 4500 [Urine:4500] Intake/Output this shift:    General appearance: alert and no distress Resp: clear to auscultation bilaterally Cardio: regular rate and rhythm, S1, S2 normal, no murmur, click, rub or gallop GI: soft, non-tender; bowel sounds normal; no masses,  no organomegaly Extremities: edema Trace edema  Lab Results:  Basename 02/02/12 1059 02/01/12 0845  WBC 17.0* 18.6*  HGB 8.4* 8.6*  HCT 24.6* 25.2*  PLT 219 208   BMET:  Basename 02/03/12 0420 02/02/12 2002  NA 139 136  K 3.8 3.7  CL 101 99  CO2 26 26  GLUCOSE 84 178*  BUN 88* 84*  CREATININE 2.81* 2.79*  CALCIUM 8.2* 8.2*   No results found for this basename: PTH:2 in the last 72 hours Iron Studies: No results found for this basename: IRON,TIBC,TRANSFERRIN,FERRITIN in the last 72 hours  Studies/Results: Dg Chest Port 1 View  02/02/2012  *RADIOLOGY REPORT*  Clinical Data: Respiratory failure.  PORTABLE CHEST - 1 VIEW  Comparison: Chest 02/01/2012 and 01/31/2012.  Findings: Support tubes and lines are unchanged.  Extensive bilateral airspace disease, worse on the left, persists but has improved over the past 2 days.  Heart size normal.  IMPRESSION: Improved left worse than right airspace disease.  Original Report Authenticated By: Arvid Right. Luther Parody, M.D.    I have reviewed the patient'Kennedy current medications.  Assessment/Plan: Problem #1  acute kidney injury is BUN and creatinine is 88 and 2.81 slight increase.. Presently nonoliguric Problem #2 hypokalemia is on potassium supplement potassium is normal Problem #3 history of her respiratory failure patient remains intubated. Presently is a febrile his white blood cell count seems to be improving Problem #4 history of anemia his hemoglobin is 8.4 hematocrit at 4.6 slight decrease. Problem #5 history of diabetes Problem #6 history of mental retardation Problem #7 history of BPH. Plan: We'll DC his Lasix We'll continue his other medications as before We'll check his basic metabolic panel and phosphorus in the morning.   LOS: 13 days   [name] 02/03/2012,7:25 AM

## 2012-02-03 NOTE — Progress Notes (Signed)
Subjective: He is overall about the same. He is still poorly responsive with sedations turned off. He has significant amount of secretions still. He remains intubated and on the ventilator  Objective: Vital signs in last 24 hours: Temp:  [96.8 F (36 C)-97.9 F (36.6 C)] 97.7 F (36.5 C) (06/02 0800) Pulse Rate:  [72-88] 79  (06/02 0800) Resp:  [11-23] 20  (06/02 0800) BP: (123-152)/(56-81) 148/69 mmHg (06/02 0800) SpO2:  [98 %-100 %] 100 % (06/02 0800) FiO2 (%):  [39.8 %-40.3 %] 39.9 % (06/02 0800) Weight:  [86.7 kg (191 lb 2.2 oz)] 86.7 kg (191 lb 2.2 oz) (06/02 0500) Weight change:  Last BM Date: 02/03/12  Intake/Output from previous day: 06/01 0701 - 06/02 0700 In: 4410 [I.V.:3048; NG/GT:1200; IV Piggyback:12] Out: 4500 [Urine:4500]  PHYSICAL EXAM General appearance: no distress and Intubated and sedated Resp: rhonchi bilaterally Cardio: regular rate and rhythm, S1, S2 normal, no murmur, click, rub or gallop GI: soft, non-tender; bowel sounds normal; no masses,  no organomegaly Extremities: extremities normal, atraumatic, no cyanosis or edema  Lab Results:    Basic Metabolic Panel:  Basename 02/03/12 0420 02/02/12 2002  NA 139 136  K 3.8 3.7  CL 101 99  CO2 26 26  GLUCOSE 84 178*  BUN 88* 84*  CREATININE 2.81* 2.79*  CALCIUM 8.2* 8.2*  MG -- --  PHOS -- --   Liver Function Tests:  Basename 02/02/12 0458  AST 26  ALT 22  ALKPHOS 149*  BILITOT 0.3  PROT 5.8*  ALBUMIN 1.8*   No results found for this basename: LIPASE:2,AMYLASE:2 in the last 72 hours No results found for this basename: AMMONIA:2 in the last 72 hours CBC:  Basename 02/02/12 1059 02/01/12 0845  WBC 17.0* 18.6*  NEUTROABS 16.0* 17.8*  HGB 8.4* 8.6*  HCT 24.6* 25.2*  MCV 81.2 79.7  PLT 219 208   Cardiac Enzymes: No results found for this basename: CKTOTAL:3,CKMB:3,CKMBINDEX:3,TROPONINI:3 in the last 72 hours BNP: No results found for this basename: PROBNP:3 in the last 72  hours D-Dimer: No results found for this basename: DDIMER:2 in the last 72 hours CBG:  Basename 02/03/12 0837 02/03/12 0805 02/03/12 0404 02/03/12 0001 02/02/12 1950 02/02/12 1603  GLUCAP 126* 57* 85 164* 138* 110*   Hemoglobin A1C: No results found for this basename: HGBA1C in the last 72 hours Fasting Lipid Panel: No results found for this basename: CHOL,HDL,LDLCALC,TRIG,CHOLHDL,LDLDIRECT in the last 72 hours Thyroid Function Tests: No results found for this basename: TSH,T4TOTAL,FREET4,T3FREE,THYROIDAB in the last 72 hours Anemia Panel: No results found for this basename: VITAMINB12,FOLATE,FERRITIN,TIBC,IRON,RETICCTPCT in the last 72 hours Coagulation: No results found for this basename: LABPROT:2,INR:2 in the last 72 hours Urine Drug Screen: Drugs of Abuse  No results found for this basename: labopia, cocainscrnur, labbenz, amphetmu, thcu, labbarb    Alcohol Level: No results found for this basename: ETH:2 in the last 72 hours Urinalysis: No results found for this basename: COLORURINE:2,APPERANCEUR:2,LABSPEC:2,PHURINE:2,GLUCOSEU:2,HGBUR:2,BILIRUBINUR:2,KETONESUR:2,PROTEINUR:2,UROBILINOGEN:2,NITRITE:2,LEUKOCYTESUR:2 in the last 72 hours Misc. Labs:  ABGS  Basename 02/03/12 0521  PHART 7.367  PO2ART 84.8  TCO2 26.0  HCO3 27.3*   CULTURES Recent Results (from the past 240 hour(s))  CULTURE, BLOOD (ROUTINE X 2)     Status: Normal   Collection Time   01/24/12  4:59 PM      Component Value Range Status Comment   Specimen Description BLOOD RIGHT HAND   Final    Special Requests BOTTLES DRAWN AEROBIC AND ANAEROBIC 8CC   Final    Culture NO  GROWTH 5 DAYS   Final    Report Status 01/29/2012 FINAL   Final   CULTURE, BLOOD (ROUTINE X 2)     Status: Normal   Collection Time   01/24/12  5:03 PM      Component Value Range Status Comment   Specimen Description BLOOD LEFT HAND   Final    Special Requests BOTTLES DRAWN AEROBIC AND ANAEROBIC 8CC   Final    Culture NO GROWTH 5 DAYS    Final    Report Status 01/29/2012 FINAL   Final   CLOSTRIDIUM DIFFICILE BY PCR     Status: Normal   Collection Time   01/27/12  9:00 PM      Component Value Range Status Comment   C difficile by pcr NEGATIVE  NEGATIVE  Final   CULTURE, BLOOD (ROUTINE X 2)     Status: Normal (Preliminary result)   Collection Time   01/30/12 10:24 AM      Component Value Range Status Comment   Specimen Description BLOOD RIGHT HAND   Final    Special Requests BOTTLES DRAWN AEROBIC AND ANAEROBIC 6CC    Final    Culture NO GROWTH 4 DAYS   Final    Report Status PENDING   Incomplete   CULTURE, BLOOD (ROUTINE X 2)     Status: Normal (Preliminary result)   Collection Time   01/30/12 10:44 AM      Component Value Range Status Comment   Specimen Description BLOOD LEFT HAND   Final    Special Requests BOTTLES DRAWN AEROBIC AND ANAEROBIC 5CC   Final    Culture NO GROWTH 4 DAYS   Final    Report Status PENDING   Incomplete    Studies/Results: Dg Chest Port 1 View  02/03/2012  *RADIOLOGY REPORT*  Clinical Data: Respiratory failure, ventilatory support  PORTABLE CHEST - 1 VIEW  Comparison: 02/02/2012  Findings: Stable support apparatus.  Persistent multi focal asymmetric airspace process worse in the left lung.  Little interval change.  No enlarging effusion or pneumothorax.  Stable heart size.  IMPRESSION: Stable asymmetric airspace process compatible with pneumonia  Original Report Authenticated By: Jerilynn Mages. Daryll Brod, M.D.   Dg Chest Port 1 View  02/02/2012  *RADIOLOGY REPORT*  Clinical Data: Respiratory failure.  PORTABLE CHEST - 1 VIEW  Comparison: Chest 02/01/2012 and 01/31/2012.  Findings: Support tubes and lines are unchanged.  Extensive bilateral airspace disease, worse on the left, persists but has improved over the past 2 days.  Heart size normal.  IMPRESSION: Improved left worse than right airspace disease.  Original Report Authenticated By: Arvid Right. Luther Parody, M.D.    Medications:  Scheduled:   . albuterol   2.5 mg Nebulization Q4H   And  . ipratropium  0.5 mg Nebulization Q4H  . antiseptic oral rinse  15 mL Mouth Rinse QID  . chlorhexidine  15 mL Mouth Rinse BID  . dextrose      . enoxaparin (LOVENOX) injection  40 mg Subcutaneous Q24H  . famotidine  20 mg Oral Daily  . fluconazole  100 mg Oral Daily  . hydrocortisone sodium succinate  50 mg Intravenous Q6H  . insulin aspart  0-15 Units Subcutaneous Q4H  . insulin NPH  30 Units Subcutaneous Q8H  . levofloxacin (LEVAQUIN) IV  750 mg Intravenous Q48H  . midazolam  2-4 mg Intravenous Once  . pantoprazole (PROTONIX) IV  40 mg Intravenous Daily  . polyethylene glycol  17 g Oral q morning - 10a  .  sodium chloride  10-40 mL Intracatheter Q12H  . Tamsulosin HCl  0.4 mg Oral Daily  . traZODone  50 mg Oral QHS  . ziprasidone  40 mg Oral BID WC  . DISCONTD: furosemide  60 mg Intravenous BID  . DISCONTD: insulin regular  0-10 Units Intravenous TID WC   Continuous:   . 0.45 % NaCl with KCl 20 mEq / L 100 mL/hr at 02/03/12 0800  . sodium chloride 20 mL/hr at 02/01/12 2002  . dextrose 5 % and 0.45% NaCl 20 mL/hr at 02/03/12 0800  . feeding supplement (GLUCERNA 1.2 CAL) 1,000 mL (02/02/12 1300)  . fentaNYL infusion INTRAVENOUS Stopped (02/03/12 0700)  . midazolam (VERSED) infusion Stopped (02/03/12 0700)  . DISCONTD: insulin (NOVOLIN-R) infusion 7.4 Units/hr (02/01/12 2130)   [id number], ALPRAZolam, alum & mag hydroxide-simeth, dextrose, dextrose, DOBUTamine, fentaNYL, fentaNYL, hyoscyamine, midazolam, midazolam, midazolam, morphine injection, norepinephrine (LEVOPHED) Adult infusion, ondansetron, sodium chloride, sodium chloride  Assesment: He has respiratory failure. He has bilateral pneumonia. He has acute on chronic renal failure. He is diabetic and his blood sugars were very high. He was septic but that seems to have improved. Although he still has a lot of secretions his chest sounds over but better. Chest x-ray is essentially  unchanged. He is off pressors insulin drip and bicarbonate drip Active Problems:  * No active hospital problems. *     Plan: Continue treatments I'll see if he can wean any today but he is not ready for extubation    LOS: 13 days   Alcus Bradly L 02/03/2012, 9:08 AM

## 2012-02-04 ENCOUNTER — Inpatient Hospital Stay (HOSPITAL_COMMUNITY): Payer: Medicaid Other

## 2012-02-04 LAB — CBC
HCT: 25.8 % — ABNORMAL LOW (ref 39.0–52.0)
Hemoglobin: 8.8 g/dL — ABNORMAL LOW (ref 13.0–17.0)
MCH: 27.5 pg (ref 26.0–34.0)
MCV: 80.6 fL (ref 78.0–100.0)
Platelets: 197 10*3/uL (ref 150–400)
Platelets: 179 10*3/uL (ref 150–400)
RBC: 3.2 MIL/uL — ABNORMAL LOW (ref 4.22–5.81)
RDW: 15.7 % — ABNORMAL HIGH (ref 11.5–15.5)
WBC: 12.6 10*3/uL — ABNORMAL HIGH (ref 4.0–10.5)
WBC: 14.1 10*3/uL — ABNORMAL HIGH (ref 4.0–10.5)

## 2012-02-04 LAB — CULTURE, BLOOD (ROUTINE X 2)

## 2012-02-04 LAB — BASIC METABOLIC PANEL
BUN: 94 mg/dL — ABNORMAL HIGH (ref 6–23)
CO2: 26 mEq/L (ref 19–32)
Calcium: 8.3 mg/dL — ABNORMAL LOW (ref 8.4–10.5)
Chloride: 102 mEq/L (ref 96–112)
Creatinine, Ser: 2.6 mg/dL — ABNORMAL HIGH (ref 0.50–1.35)
Creatinine, Ser: 2.56 mg/dL — ABNORMAL HIGH (ref 0.50–1.35)
GFR calc Af Amer: 31 mL/min — ABNORMAL LOW (ref >90)
GFR calc Af Amer: 32 mL/min — ABNORMAL LOW (ref >90)
GFR calc Af Amer: 32 mL/min — ABNORMAL LOW (ref >90)
GFR calc non Af Amer: 27 mL/min — ABNORMAL LOW (ref >90)
Potassium: 4.1 mEq/L (ref 3.5–5.1)
Sodium: 137 mEq/L (ref 135–145)
Sodium: 138 mEq/L (ref 135–145)

## 2012-02-04 LAB — BLOOD GAS, ARTERIAL
Acid-Base Excess: 1.4 mmol/L (ref 0.0–2.0)
Bicarbonate: 25.8 mEq/L — ABNORMAL HIGH (ref 20.0–24.0)
FIO2: 0.4 %
O2 Saturation: 98 %
RATE: 20 resp/min
TCO2: 24.4 mmol/L (ref 0–100)
pCO2 arterial: 43.2 mmHg (ref 35.0–45.0)
pO2, Arterial: 99 mmHg (ref 80.0–100.0)

## 2012-02-04 LAB — GLUCOSE, CAPILLARY
Glucose-Capillary: 161 mg/dL — ABNORMAL HIGH (ref 70–99)
Glucose-Capillary: 206 mg/dL — ABNORMAL HIGH (ref 70–99)
Glucose-Capillary: 59 mg/dL — ABNORMAL LOW (ref 70–99)
Glucose-Capillary: 83 mg/dL (ref 70–99)

## 2012-02-04 LAB — DIFFERENTIAL
Basophils Absolute: 0 10*3/uL (ref 0.0–0.1)
Basophils Relative: 0 % (ref 0–1)
Lymphocytes Relative: 4 % — ABNORMAL LOW (ref 12–46)
Neutro Abs: 12.6 10*3/uL — ABNORMAL HIGH (ref 1.7–7.7)
Neutrophils Relative %: 90 % — ABNORMAL HIGH (ref 43–77)

## 2012-02-04 LAB — PHOSPHORUS: Phosphorus: 5.7 mg/dL — ABNORMAL HIGH (ref 2.3–4.6)

## 2012-02-04 MED ORDER — DEXTROSE 50 % IV SOLN
INTRAVENOUS | Status: AC
  Filled 2012-02-04: qty 50

## 2012-02-04 MED ORDER — VANCOMYCIN HCL IN DEXTROSE 1-5 GM/200ML-% IV SOLN
1000.0000 mg | Freq: Once | INTRAVENOUS | Status: AC
  Administered 2012-02-04: 1000 mg via INTRAVENOUS
  Filled 2012-02-04: qty 200

## 2012-02-04 MED ORDER — HYOSCYAMINE SULFATE 0.125 MG SL SUBL: 0.1250 mg | SUBLINGUAL_TABLET | SUBLINGUAL | Status: DC | PRN

## 2012-02-04 NOTE — Progress Notes (Signed)
Nutrition Follow-up  Diet Order: NPO intubated, sedated. Failed weaning today. Edema noted. Cont to tolerate Glucerna 1.2 @50  ml/hr which is meeting 60% est energy, 53% est protein needs.  Meds: Scheduled Meds:   . albuterol  2.5 mg Nebulization Q4H   And  . ipratropium  0.5 mg Nebulization Q4H  . antiseptic oral rinse  15 mL Mouth Rinse QID  . chlorhexidine  15 mL Mouth Rinse BID  . dextrose      . enoxaparin (LOVENOX) injection  40 mg Subcutaneous Q24H  . famotidine  20 mg Oral Daily  . fluconazole  100 mg Oral Daily  . hydrocortisone sodium succinate  50 mg Intravenous Q6H  . insulin aspart  0-15 Units Subcutaneous Q4H  . insulin NPH  20 Units Subcutaneous Q8H  . levofloxacin (LEVAQUIN) IV  750 mg Intravenous Q48H  . midazolam  2-4 mg Intravenous Once  . pantoprazole (PROTONIX) IV  40 mg Intravenous Daily  . polyethylene glycol  17 g Oral q morning - 10a  . sodium chloride  10-40 mL Intracatheter Q12H  . Tamsulosin HCl  0.4 mg Oral Daily  . traZODone  50 mg Oral QHS  . vancomycin  1,000 mg Intravenous Once  . ziprasidone  40 mg Oral BID WC   Continuous Infusions:   . 0.45 % NaCl with KCl 20 mEq / L 1,000 mL (02/04/12 1533)  . sodium chloride 20 mL/hr at 02/01/12 2002  . feeding supplement (GLUCERNA 1.2 CAL) 1,000 mL (02/04/12 0913)  . fentaNYL infusion INTRAVENOUS 75 mcg/hr (02/04/12 1300)  . midazolam (VERSED) infusion 4 mg/hr (02/04/12 1300)  . DISCONTD: dextrose 5 % and 0.45% NaCl 20 mL/hr at 02/04/12 1300   PRN Meds:.acetaminophen, ALPRAZolam, alum & mag hydroxide-simeth, dextrose, dextrose, DOBUTamine, fentaNYL, fentaNYL, hyoscyamine, midazolam, midazolam, midazolam, morphine injection, norepinephrine (LEVOPHED) Adult infusion, ondansetron, sodium chloride, sodium chloride, DISCONTD: hyoscyamine  Labs:  CMP     Component Value Date/Time   NA 138 02/04/2012 1221   K 4.1 02/04/2012 1221   CL 102 02/04/2012 1221   CO2 25 02/04/2012 1221   GLUCOSE 173* 02/04/2012 1221   BUN  94* 02/04/2012 1221   CREATININE 2.59* 02/04/2012 1221   CALCIUM 8.6 02/04/2012 1221   PROT 5.8* 02/02/2012 0458   ALBUMIN 1.8* 02/02/2012 0458   AST 26 02/02/2012 0458   ALT 22 02/02/2012 0458   ALKPHOS 149* 02/02/2012 0458   BILITOT 0.3 02/02/2012 0458   GFRNONAA 27* 02/04/2012 1221   GFRAA 32* 02/04/2012 1221     Intake/Output Summary (Last 24 hours) at 02/04/12 1626 Last data filed at 02/04/12 1500  Gross per 24 hour  Intake 4406.37 ml  Output   1600 ml  Net 2806.37 ml    Weight Status: 02/03/12- 191# (87 kg) reflects severe wt gain.  previous wt 149# (01/21/12)   Estimated needs:   1895-2085 kcal per day  102-115 gr protein daily  500 ml plus urine output  Nutrition Dx: Inadequate oral intake continues NOW r/t inability to eat AEB mechanical ventilation, NPO status   Goal: Pt will cont to tol TF and increase energy and protein intake as medically appropriate.  Monitor: Nutrition provision and adeqacey   Frederik Schmidt Pager [phone]

## 2012-02-04 NOTE — Consult Note (Signed)
ANTIBIOTIC CONSULT NOTE   Pharmacy Consult for Vancomycin and Levaquin Indication: PNA  No Known Allergies  Patient Measurements: Height: 5\' 10"  (177.8 cm) Weight: 191 lb 2.2 oz (86.7 kg) IBW/kg (Calculated) : 73   Vital Signs: Temp: 97.5 F (36.4 C) (06/03 0700) Temp src: Oral (06/03 0700) BP: 166/70 mmHg (06/03 0700) Pulse Rate: 67  (06/03 0700) Intake/Output from previous day: 06/02 0701 - 06/03 0700 In: 4318 [I.V.:2918; NG/GT:1150; IV Piggyback:160] Out: 2850 [Urine:2850] Intake/Output from this shift:    Labs:  Basename 02/04/12 0645 02/03/12 2002 02/03/12 1145 02/02/12 1059  WBC 12.6* -- 15.2* 17.0*  HGB 8.8* -- 8.6* 8.4*  PLT 197 -- 205 219  LABCREA -- -- -- --  CREATININE 2.60* 2.73* 2.77* --   Estimated Creatinine Clearance: 35.1 ml/min (by C-G formula based on Cr of 2.6).  Basename 02/04/12 0645 02/02/12 0458  VANCOTROUGH -- --  VANCOPEAK -- --  VANCORANDOM 17.3 30.4  GENTTROUGH -- --  GENTPEAK -- --  GENTRANDOM -- --  TOBRATROUGH -- --  TOBRAPEAK -- --  TOBRARND -- --  AMIKACINPEAK -- --  AMIKACINTROU -- --  AMIKACIN -- --    Microbiology: Recent Results (from the past 720 hour(s))  URINE CULTURE     Status: Normal   Collection Time   01/11/12 10:49 PM      Component Value Range Status Comment   Specimen Description URINE, CATHETERIZED   Final    Special Requests NONE   Final    Culture  Setup Time [id number]   Final    Colony Count >=100,000 COLONIES/ML   Final    Culture     Final    Value: Multiple bacterial morphotypes present, none predominant. Suggest appropriate recollection if clinically indicated.   Report Status 01/13/2012 FINAL   Final   URINE CULTURE     Status: Normal   Collection Time   01/21/12  3:36 PM      Component Value Range Status Comment   Specimen Description URINE, CATHETERIZED   Final    Special Requests NONE   Final    Culture  Setup Time [id number]   Final    Colony Count >=100,000 COLONIES/ML   Final    Culture     Final    Value: ENTEROCOCCUS SPECIES     YEAST   Report Status 01/25/2012 FINAL   Final    Organism ID, Bacteria ENTEROCOCCUS SPECIES   Final   MRSA PCR SCREENING     Status: Abnormal   Collection Time   01/22/12  9:35 AM      Component Value Range Status Comment   MRSA by PCR INVALID RESULTS, SPECIMEN SENT FOR CULTURE (*) NEGATIVE  Final   MRSA CULTURE     Status: Normal   Collection Time   01/22/12  9:35 AM      Component Value Range Status Comment   Specimen Description NOSE NASAL SWAB   Final    Special Requests NONE   Final    Culture     Final    Value: NO STAPHYLOCOCCUS AUREUS ISOLATED     Note: NOMRSA   Report Status 01/24/2012 FINAL   Final   CULTURE, BLOOD (ROUTINE X 2)     Status: Normal   Collection Time   01/24/12  4:59 PM      Component Value Range Status Comment   Specimen Description BLOOD RIGHT HAND   Final    Special Requests BOTTLES DRAWN AEROBIC AND ANAEROBIC 8CC  Final    Culture NO GROWTH 5 DAYS   Final    Report Status 01/29/2012 FINAL   Final   CULTURE, BLOOD (ROUTINE X 2)     Status: Normal   Collection Time   01/24/12  5:03 PM      Component Value Range Status Comment   Specimen Description BLOOD LEFT HAND   Final    Special Requests BOTTLES DRAWN AEROBIC AND ANAEROBIC 8CC   Final    Culture NO GROWTH 5 DAYS   Final    Report Status 01/29/2012 FINAL   Final   CLOSTRIDIUM DIFFICILE BY PCR     Status: Normal   Collection Time   01/27/12  9:00 PM      Component Value Range Status Comment   C difficile by pcr NEGATIVE  NEGATIVE  Final   CULTURE, BLOOD (ROUTINE X 2)     Status: Normal (Preliminary result)   Collection Time   01/30/12 10:24 AM      Component Value Range Status Comment   Specimen Description BLOOD RIGHT HAND   Final    Special Requests BOTTLES DRAWN AEROBIC AND ANAEROBIC 6CC    Final    Culture NO GROWTH 4 DAYS   Final    Report Status PENDING   Incomplete   CULTURE, BLOOD (ROUTINE X 2)     Status: Normal (Preliminary result)    Collection Time   01/30/12 10:44 AM      Component Value Range Status Comment   Specimen Description BLOOD LEFT HAND   Final    Special Requests BOTTLES DRAWN AEROBIC AND ANAEROBIC 5CC   Final    Culture NO GROWTH 4 DAYS   Final    Report Status PENDING   Incomplete    Medical History: Past Medical History  Diagnosis Date  . Renal disorder   . Elective mutism   . Autism   . Hyperosmolar (nonketotic) coma   . Hyperglycemia   . Diabetes mellitus   . Hyponatremia   . Mental retardation   . Urinary retention   . BPH (benign prostatic hyperplasia)   . Gastroparesis   . Depression   . Pneumonia   . UTI (urinary tract infection)   . Mental retardation    Medications:  Scheduled:     . albuterol  2.5 mg Nebulization Q4H   And  . ipratropium  0.5 mg Nebulization Q4H  . antiseptic oral rinse  15 mL Mouth Rinse QID  . chlorhexidine  15 mL Mouth Rinse BID  . dextrose      . enoxaparin (LOVENOX) injection  40 mg Subcutaneous Q24H  . famotidine  20 mg Oral Daily  . fluconazole  100 mg Oral Daily  . hydrocortisone sodium succinate  50 mg Intravenous Q6H  . insulin aspart  0-15 Units Subcutaneous Q4H  . insulin NPH  20 Units Subcutaneous Q8H  . levofloxacin (LEVAQUIN) IV  750 mg Intravenous Q48H  . midazolam  2-4 mg Intravenous Once  . pantoprazole (PROTONIX) IV  40 mg Intravenous Daily  . polyethylene glycol  17 g Oral q morning - 10a  . sodium chloride  10-40 mL Intracatheter Q12H  . Tamsulosin HCl  0.4 mg Oral Daily  . traZODone  50 mg Oral QHS  . vancomycin  1,000 mg Intravenous Once  . ziprasidone  40 mg Oral BID WC   Assessment: Pt currently on day #10 Vancomycin, day#10 diflucan, day#6 Levaquin for aspiration PNA and enterococcal UTI.   Pt has  acute on chronic renal failure.  Medications adjusted.   Vancomycin random level <20 today. Estimated half-life of 100hrs, although renal function changing.  Goal of Therapy:  Vancomycin trough level 15-20 Eradicate  infection.  Plan: 1) Vancomycin 1000mg  IV x 1 dose today 2) Monitor SCr daily for now 3) Re-check random vancomycin level Thursday 4) Continue Levaquin to 750mg  IV Q48hrs  5) Consider d/c diflucan  Iasha Mccalister, Lavonia Drafts 02/04/2012,10:47 AM

## 2012-02-04 NOTE — Progress Notes (Signed)
[name], [name]           ACCOUNT NO.:  [id number]  MEDICAL RECORD NO.:  [id number]  LOCATION:  IC03                          FACILITY:  [facility]  PHYSICIAN:  Loni Beckwith, MD DATE OF BIRTH:  [birth year]  DATE OF PROCEDURE:  02/04/2012 DATE OF DISCHARGE:                                PROGRESS NOTE   REASON FOR FOLLOW UP:  Uncontrolled type 1 diabetes.  SUBJECTIVE:  The patient remains on mechanical ventilation.  He is still on p.o. feeding using Glucerna at 50 mL/hour, tolerating very well.  He did not pass the weaning test off of the mechanical ventilation.  OBJECTIVE:  VITAL SIGNS:  Blood pressure is ranging from [id number] systolic over 0000000 diastolic, pulse rate 75, temperature 97.4.  He makes urine close to 3 L and balanced input of 2.9 L through the NG tube. GENERAL:  Sick looking, responding to stimuli. CHEST:  Significant for rhonchi bilaterally. CARDIOVASCULAR:  Distant heart sounds.  No murmur.  No gallop. ABDOMEN:  Soft.  Bowel sounds hypoactive. EXTREMITIES:  No edema.  His labs show sodium 137, potassium 4.1, chloride 100, bicarb 26, BUN 89, creatinine 2.6.  His glycemic control ranged from 83-200, currently on NPH 20 units every 8 hours plus NovoLog sliding scale.  ASSESSMENT: 1. Diabetes chronically uncontrolled. 2. Respiratory failure, requiring mechanical ventilation secondary to     bilateral pneumonia. 3. Acute kidney injury. 4. Anemia. 5. History of mental retardation.  PLAN:  The patient is currently off of the insulin drip, doing relatively well on subcutaneous insulin.  I will continue with NPH 20 units every 8 hours plus moderate dose NovoLog per sliding scale. Continue fingerstick at least every 4 hours and will re-evaluate tomorrow to titrate insulin, management of other conditions per Nephrology and Dr. Luan Pulling.          ______________________________ Loni Beckwith, MD     GN/MEDQ  D:  02/04/2012  T:  02/04/2012  Job:   [id number]

## 2012-02-04 NOTE — Clinical Social Work Note (Signed)
CSW left voicemail to update [name] at [facility] on pt.  Awaiting return call for her request of facilities.  CSW called [facility] and requested that they wait to assess pt until off vent.  [name] with [facility] called and will complete face to face evaluation today.    [name], [name]

## 2012-02-04 NOTE — Progress Notes (Signed)
Subjective: He remains intubated on the ventilator. He is still having a lot of secretions. He is not very responsive he when sedation is turned off. He did not do well with weaning yesterday  Objective: Vital signs in last 24 hours: Temp:  [97.5 F (36.4 C)-98.4 F (36.9 C)] 97.5 F (36.4 C) (06/03 0700) Pulse Rate:  [64-95] 67  (06/03 0700) Resp:  [8-24] 20  (06/03 0700) BP: (130-174)/(50-79) 166/70 mmHg (06/03 0700) SpO2:  [94 %-100 %] 100 % (06/03 0733) FiO2 (%):  [39.7 %-40.3 %] 40 % (06/03 0733) Weight change:  Last BM Date: 02/03/12  Intake/Output from previous day: 06/02 0701 - 06/03 0700 In: 4318 [I.V.:2918; NG/GT:1150; IV Piggyback:160] Out: 2850 [Urine:2850]  PHYSICAL EXAM General appearance: no distress and Intubated and sedated Resp: rhonchi bilaterally Cardio: regular rate and rhythm, S1, S2 normal, no murmur, click, rub or gallop GI: soft, non-tender; bowel sounds normal; no masses,  no organomegaly Extremities: extremities normal, atraumatic, no cyanosis or edema  Lab Results:    Basic Metabolic Panel:  Basename 02/04/12 0645 02/03/12 2002  NA 137 138  K 4.1 3.8  CL 100 100  CO2 26 25  GLUCOSE 206* 94  BUN 89* 90*  CREATININE 2.60* 2.73*  CALCIUM 8.3* 8.2*  MG -- --  PHOS 5.7* --   Liver Function Tests:  Basename 02/02/12 0458  AST 26  ALT 22  ALKPHOS 149*  BILITOT 0.3  PROT 5.8*  ALBUMIN 1.8*   No results found for this basename: LIPASE:2,AMYLASE:2 in the last 72 hours No results found for this basename: AMMONIA:2 in the last 72 hours CBC:  Basename 02/04/12 0645 02/03/12 1145 02/02/12 1059  WBC 12.6* 15.2* --  NEUTROABS -- 14.0* 16.0*  HGB 8.8* 8.6* --  HCT 25.8* 25.6* --  MCV 80.6 81.0 --  PLT 197 205 --   Cardiac Enzymes: No results found for this basename: CKTOTAL:3,CKMB:3,CKMBINDEX:3,TROPONINI:3 in the last 72 hours BNP: No results found for this basename: PROBNP:3 in the last 72 hours D-Dimer: No results found for this  basename: DDIMER:2 in the last 72 hours CBG:  Basename 02/04/12 0341 02/04/12 0041 02/03/12 1942 02/03/12 1540 02/03/12 1203 02/03/12 0837  GLUCAP 131* 83 92 157* 114* 126*   Hemoglobin A1C: No results found for this basename: HGBA1C in the last 72 hours Fasting Lipid Panel: No results found for this basename: CHOL,HDL,LDLCALC,TRIG,CHOLHDL,LDLDIRECT in the last 72 hours Thyroid Function Tests: No results found for this basename: TSH,T4TOTAL,FREET4,T3FREE,THYROIDAB in the last 72 hours Anemia Panel: No results found for this basename: VITAMINB12,FOLATE,FERRITIN,TIBC,IRON,RETICCTPCT in the last 72 hours Coagulation: No results found for this basename: LABPROT:2,INR:2 in the last 72 hours Urine Drug Screen: Drugs of Abuse  No results found for this basename: labopia, cocainscrnur, labbenz, amphetmu, thcu, labbarb    Alcohol Level: No results found for this basename: ETH:2 in the last 72 hours Urinalysis: No results found for this basename: COLORURINE:2,APPERANCEUR:2,LABSPEC:2,PHURINE:2,GLUCOSEU:2,HGBUR:2,BILIRUBINUR:2,KETONESUR:2,PROTEINUR:2,UROBILINOGEN:2,NITRITE:2,LEUKOCYTESUR:2 in the last 72 hours Misc. Labs:  ABGS  Basename 02/04/12 0530  PHART 7.393  PO2ART 99.0  TCO2 24.4  HCO3 25.8*   CULTURES Recent Results (from the past 240 hour(s))  CLOSTRIDIUM DIFFICILE BY PCR     Status: Normal   Collection Time   01/27/12  9:00 PM      Component Value Range Status Comment   C difficile by pcr NEGATIVE  NEGATIVE  Final   CULTURE, BLOOD (ROUTINE X 2)     Status: Normal (Preliminary result)   Collection Time   01/30/12 10:24  AM      Component Value Range Status Comment   Specimen Description BLOOD RIGHT HAND   Final    Special Requests BOTTLES DRAWN AEROBIC AND ANAEROBIC 6CC    Final    Culture NO GROWTH 4 DAYS   Final    Report Status PENDING   Incomplete   CULTURE, BLOOD (ROUTINE X 2)     Status: Normal (Preliminary result)   Collection Time   01/30/12 10:44 AM       Component Value Range Status Comment   Specimen Description BLOOD LEFT HAND   Final    Special Requests BOTTLES DRAWN AEROBIC AND ANAEROBIC 5CC   Final    Culture NO GROWTH 4 DAYS   Final    Report Status PENDING   Incomplete    Studies/Results: Dg Chest Port 1 View  02/04/2012  *RADIOLOGY REPORT*  Clinical Data: Respiratory failure.  Mental retardation.  Diabetes. Renal failure.  PORTABLE CHEST - 1 VIEW  Comparison: 1 day prior and back to 09/18/2011  Findings: Endotracheal tube appropriately position.  Nasogastric tube extends beyond the  inferior aspect of the film.  Left subclavian central line tip SVC.  Normal heart size.  No pleural effusion or pneumothorax.  Left greater than right bilateral airspace disease is minimally improved, especially on the left.  Relative sparing of the right apex and right lung base.  IMPRESSION: Slight improvement in pneumonia versus asymmetric pulmonary edema.  Original Report Authenticated By: Areta Haber, M.D.   Dg Chest Port 1 View  02/03/2012  *RADIOLOGY REPORT*  Clinical Data: Respiratory failure, ventilatory support  PORTABLE CHEST - 1 VIEW  Comparison: 02/02/2012  Findings: Stable support apparatus.  Persistent multi focal asymmetric airspace process worse in the left lung.  Little interval change.  No enlarging effusion or pneumothorax.  Stable heart size.  IMPRESSION: Stable asymmetric airspace process compatible with pneumonia  Original Report Authenticated By: Jerilynn Mages. Daryll Brod, M.D.    Medications:  Scheduled:   . albuterol  2.5 mg Nebulization Q4H   And  . ipratropium  0.5 mg Nebulization Q4H  . antiseptic oral rinse  15 mL Mouth Rinse QID  . chlorhexidine  15 mL Mouth Rinse BID  . dextrose      . enoxaparin (LOVENOX) injection  40 mg Subcutaneous Q24H  . famotidine  20 mg Oral Daily  . fluconazole  100 mg Oral Daily  . hydrocortisone sodium succinate  50 mg Intravenous Q6H  . insulin aspart  0-15 Units Subcutaneous Q4H  . insulin NPH  20  Units Subcutaneous Q8H  . levofloxacin (LEVAQUIN) IV  750 mg Intravenous Q48H  . midazolam  2-4 mg Intravenous Once  . pantoprazole (PROTONIX) IV  40 mg Intravenous Daily  . polyethylene glycol  17 g Oral q morning - 10a  . sodium chloride  10-40 mL Intracatheter Q12H  . Tamsulosin HCl  0.4 mg Oral Daily  . traZODone  50 mg Oral QHS  . ziprasidone  40 mg Oral BID WC  . DISCONTD: insulin NPH  30 Units Subcutaneous Q8H   Continuous:   . 0.45 % NaCl with KCl 20 mEq / L 100 mL/hr at 02/04/12 0600  . sodium chloride 20 mL/hr at 02/01/12 2002  . dextrose 5 % and 0.45% NaCl 20 mL/hr at 02/04/12 0600  . feeding supplement (GLUCERNA 1.2 CAL) 1,000 mL (02/03/12 1153)  . fentaNYL infusion INTRAVENOUS 60 mcg/hr (02/04/12 0600)  . midazolam (VERSED) infusion 4 mg/hr (02/04/12 0600)   [id number],  ALPRAZolam, alum & mag hydroxide-simeth, dextrose, dextrose, DOBUTamine, fentaNYL, fentaNYL, hyoscyamine, midazolam, midazolam, midazolam, morphine injection, norepinephrine (LEVOPHED) Adult infusion, ondansetron, sodium chloride, sodium chloride  Assesment: He has respiratory failure. He has bilateral pneumonia. He said problems with his blood sugar. He was unable to wean yesterday. He still has a lot of secretions in his chest x-ray still shows significant abnormalities Active Problems:  * No active hospital problems. *     Plan: No change try to wean again today.    LOS: 14 days   Gia Lusher L 02/04/2012, 7:49 AM

## 2012-02-04 NOTE — Progress Notes (Signed)
Patient ID: [name], male   DOB: [birth year], 51 y.o.   MRN: [id number]

## 2012-02-04 NOTE — Progress Notes (Signed)
Subjective: Interval History: none. Patient remained intubated.  Objective: Vital signs in last 24 hours: Temp:  [97.5 F (36.4 C)-98.4 F (36.9 C)] 97.5 F (36.4 C) (06/03 0700) Pulse Rate:  [64-95] 67  (06/03 0700) Resp:  [8-24] 20  (06/03 0700) BP: (130-174)/(50-79) 166/70 mmHg (06/03 0700) SpO2:  [94 %-100 %] 100 % (06/03 0733) FiO2 (%):  [39.7 %-40.3 %] 40 % (06/03 0733) Weight change:   Intake/Output from previous day: 06/02 0701 - 06/03 0700 In: 4318 [I.V.:2918; NG/GT:1150; IV Piggyback:160] Out: 2850 [Urine:2850] Intake/Output this shift:    Chest decreased breath sound bilaterally he has some expiratory wheezing. Heart regular rate and rhythm no murmur Abdomen soft positive bowel sound Extremities he has trace to 1+ edema.  Lab Results:  Basename 02/04/12 0645 02/03/12 1145  WBC 12.6* 15.2*  HGB 8.8* 8.6*  HCT 25.8* 25.6*  PLT 197 205   BMET:  Basename 02/04/12 0645 02/03/12 2002  NA 137 138  K 4.1 3.8  CL 100 100  CO2 26 25  GLUCOSE 206* 94  BUN 89* 90*  CREATININE 2.60* 2.73*  CALCIUM 8.3* 8.2*   No results found for this basename: PTH:2 in the last 72 hours Iron Studies: No results found for this basename: IRON,TIBC,TRANSFERRIN,FERRITIN in the last 72 hours  Studies/Results: Dg Chest Port 1 View  02/04/2012  *RADIOLOGY REPORT*  Clinical Data: Respiratory failure.  Mental retardation.  Diabetes. Renal failure.  PORTABLE CHEST - 1 VIEW  Comparison: 1 day prior and back to 09/18/2011  Findings: Endotracheal tube appropriately position.  Nasogastric tube extends beyond the  inferior aspect of the film.  Left subclavian central line tip SVC.  Normal heart size.  No pleural effusion or pneumothorax.  Left greater than right bilateral airspace disease is minimally improved, especially on the left.  Relative sparing of the right apex and right lung base.  IMPRESSION: Slight improvement in pneumonia versus asymmetric pulmonary edema.  Original Report  Authenticated By: Areta Haber, M.D.   Dg Chest Port 1 View  02/03/2012  *RADIOLOGY REPORT*  Clinical Data: Respiratory failure, ventilatory support  PORTABLE CHEST - 1 VIEW  Comparison: 02/02/2012  Findings: Stable support apparatus.  Persistent multi focal asymmetric airspace process worse in the left lung.  Little interval change.  No enlarging effusion or pneumothorax.  Stable heart size.  IMPRESSION: Stable asymmetric airspace process compatible with pneumonia  Original Report Authenticated By: Jerilynn Mages. Daryll Brod, M.D.    I have reviewed the patient's current medications.  Assessment/Plan: Problem #1 acute kidney injury his BUN is 89 creatinine is 2.6 improving. Presently he is none oliguric. Problem #2 respiratory failure patient remains intubated Problem #3 history of BPH he has a Foley catheter Problem #4 history of possible aspiration pneumonia Problem #5 history of her anemia at this moment seems to be iron deficiency Problem #6 history of diabetes Problem #7 history of mental retardation.  Problem #8 hyperphosphatemia phosphorus 5.7. Plan: We'll continue his present management We'll follow his phosphorus is not going to improve with improvement of his renal function remains change his feeding to Nepro or probably add  phosphorus binder. We'll continue his present management and we'll check his basic metabolic panel in the morning.   LOS: 14 days   [name] S 02/04/2012,8:01 AM

## 2012-02-04 NOTE — Progress Notes (Signed)
RESPIRATORY THERAPY: Patient assessed on vent and attempted weaning per protocol.  Patient failed wean as he became agitated, with increasing respirations, heart rate and decreased tidal volumes.  Patient has been somewhat aware and responsive with varying degrees of sedation.  Suctioned small to scant amounts of clear/white secretions. Breath sounds have been coarse/rhonchus with some improvement with nebulizer treatments.  Patient O2 saturations have been good.   Henrine Screws, RRT

## 2012-02-05 ENCOUNTER — Encounter (HOSPITAL_COMMUNITY): Payer: Self-pay | Admitting: Anesthesiology

## 2012-02-05 ENCOUNTER — Inpatient Hospital Stay (HOSPITAL_COMMUNITY): Payer: Medicaid Other

## 2012-02-05 LAB — GLUCOSE, CAPILLARY
Glucose-Capillary: 209 mg/dL — ABNORMAL HIGH (ref 70–99)
Glucose-Capillary: 75 mg/dL (ref 70–99)

## 2012-02-05 LAB — BLOOD GAS, ARTERIAL
Acid-Base Excess: 0.1 mmol/L (ref 0.0–2.0)
Bicarbonate: 24.5 mEq/L — ABNORMAL HIGH (ref 20.0–24.0)
Bicarbonate: 24.2 mEq/L — ABNORMAL HIGH (ref 20.0–24.0)
MECHVT: 500 mL
O2 Content: 40 L/min
O2 Saturation: 98.9 %
Patient temperature: 37
Patient temperature: 37
TCO2: 22.7 mmol/L (ref 0–100)
TCO2: 23.1 mmol/L (ref 0–100)

## 2012-02-05 LAB — BASIC METABOLIC PANEL
BUN: 97 mg/dL — ABNORMAL HIGH (ref 6–23)
CO2: 25 mEq/L (ref 19–32)
Chloride: 103 mEq/L (ref 96–112)
Chloride: 104 mEq/L (ref 96–112)
Creatinine, Ser: 2.4 mg/dL — ABNORMAL HIGH (ref 0.50–1.35)
GFR calc Af Amer: 32 mL/min — ABNORMAL LOW (ref >90)
Glucose, Bld: 83 mg/dL (ref 70–99)
Potassium: 5 mEq/L (ref 3.5–5.1)
Sodium: 137 mEq/L (ref 135–145)

## 2012-02-05 LAB — T4, FREE: Free T4: 0.81 ng/dL (ref 0.80–1.80)

## 2012-02-05 LAB — TSH: TSH: 0.072 u[IU]/mL — ABNORMAL LOW (ref 0.350–4.500)

## 2012-02-05 MED ORDER — IPRATROPIUM BROMIDE 0.02 % IN SOLN: 0.5000 mg | RESPIRATORY_TRACT | Status: DC

## 2012-02-05 MED ORDER — LIDOCAINE HCL (CARDIAC) 20 MG/ML IV SOLN
INTRAVENOUS | Status: AC
  Filled 2012-02-05: qty 5

## 2012-02-05 MED ORDER — ALBUTEROL SULFATE (5 MG/ML) 0.5% IN NEBU
2.5000 mg | INHALATION_SOLUTION | RESPIRATORY_TRACT | Status: DC | PRN
  Administered 2012-02-16 – 2012-02-17 (×3): 2.5 mg via RESPIRATORY_TRACT
  Filled 2012-02-05 (×3): qty 0.5

## 2012-02-05 MED ORDER — ALBUTEROL SULFATE (5 MG/ML) 0.5% IN NEBU: 2.5000 mg | INHALATION_SOLUTION | RESPIRATORY_TRACT | Status: DC

## 2012-02-05 MED ORDER — SUCCINYLCHOLINE CHLORIDE 20 MG/ML IJ SOLN
INTRAMUSCULAR | Status: AC
  Administered 2012-02-05: 100 mg
  Filled 2012-02-05: qty 1

## 2012-02-05 MED ORDER — MIDAZOLAM HCL 50 MG/10ML IJ SOLN
INTRAMUSCULAR | Status: AC
  Filled 2012-02-05: qty 1

## 2012-02-05 MED ORDER — FENTANYL CITRATE 0.05 MG/ML IJ SOLN
INTRAMUSCULAR | Status: AC
  Filled 2012-02-05: qty 50

## 2012-02-05 MED ORDER — NEPRO/CARBSTEADY PO LIQD
1000.0000 mL | ORAL | Status: DC
  Administered 2012-02-05 – 2012-02-09 (×2): 1000 mL
  Filled 2012-02-05 (×7): qty 1000

## 2012-02-05 MED ORDER — DEXTROSE-NACL 5-0.45 % IV SOLN
INTRAVENOUS | Status: DC
  Administered 2012-02-05 – 2012-02-08 (×5): via INTRAVENOUS

## 2012-02-05 MED ORDER — ETOMIDATE 2 MG/ML IV SOLN
INTRAVENOUS | Status: AC
  Administered 2012-02-05: 14 mg
  Filled 2012-02-05: qty 20

## 2012-02-05 MED ORDER — PANTOPRAZOLE SODIUM 40 MG IV SOLR: 40.0000 mg | Freq: Every day | INTRAVENOUS | Status: DC

## 2012-02-05 MED ORDER — ROCURONIUM BROMIDE 50 MG/5ML IV SOLN
INTRAVENOUS | Status: AC
  Filled 2012-02-05: qty 2

## 2012-02-05 MED ORDER — INSULIN NPH (HUMAN) (ISOPHANE) 100 UNIT/ML ~~LOC~~ SUSP
15.0000 [IU] | Freq: Three times a day (TID) | SUBCUTANEOUS | Status: DC
  Administered 2012-02-05 – 2012-02-06 (×2): 15 [IU] via SUBCUTANEOUS
  Filled 2012-02-05 (×24): qty 10

## 2012-02-05 NOTE — Progress Notes (Signed)
NICOLES MONTESINO  MRN: [id number]  DOB/AGE: 50-Dec-1962 50 y.o.  [place], MD, MD  Admit date: 01/21/2012  Chief Complaint:  Chief Complaint  Patient presents with  . Hypoglycemia    S-Pt presented on  01/21/2012 with  Chief Complaint  Patient presents with  . Hypoglycemia  Pt remains intubated.  Meds    . albuterol  2.5 mg Nebulization Q4H   And  . ipratropium  0.5 mg Nebulization Q4H  . antiseptic oral rinse  15 mL Mouth Rinse QID  . chlorhexidine  15 mL Mouth Rinse BID  . dextrose      . enoxaparin (LOVENOX) injection  40 mg Subcutaneous Q24H  . famotidine  20 mg Oral Daily  . fluconazole  100 mg Oral Daily  . hydrocortisone sodium succinate  50 mg Intravenous Q6H  . insulin aspart  0-15 Units Subcutaneous Q4H  . insulin NPH  20 Units Subcutaneous Q8H  . levofloxacin (LEVAQUIN) IV  750 mg Intravenous Q48H  . midazolam  2-4 mg Intravenous Once  . pantoprazole (PROTONIX) IV  40 mg Intravenous Daily  . polyethylene glycol  17 g Oral q morning - 10a  . sodium chloride  10-40 mL Intracatheter Q12H  . Tamsulosin HCl  0.4 mg Oral Daily  . traZODone  50 mg Oral QHS  . vancomycin  1,000 mg Intravenous Once  . ziprasidone  40 mg Oral BID WC      Physical Exam: Vital signs in last 24 hours: Temp:  [97.4 F (36.3 C)-98.2 F (36.8 C)] 97.8 F (36.6 C) (06/04 0400) Pulse Rate:  [67-96] 71  (06/04 0600) Resp:  [17-25] 20  (06/04 0600) BP: (126-166)/(54-73) 153/69 mmHg (06/04 0600) SpO2:  [89 %-100 %] 100 % (06/04 0600) FiO2 (%):  [39.7 %-40.4 %] 39.8 % (06/04 0600) Weight:  [194 lb 3.6 oz (88.1 kg)] 194 lb 3.6 oz (88.1 kg) (06/04 0500) Weight change:  Last BM Date: 02/04/12  Intake/Output from previous day: 06/03 0701 - 06/04 0700 In: 5716.4 [I.V.:4156.4; NG/GT:1350; IV Piggyback:210] Out: 550 [Urine:550] Total I/O In: 3925.5 [I.V.:3075.5; NG/GT:850] Out: 550 [Urine:550]   Physical Exam: General- pt is intubated Resp- No acute  REsp distress, B/L Rhonchi CVS- S1S2 regular in rate and rhythm GIT- BS+, soft, NT, ND EXT- NO LE Edema, Cyanosis   Lab Results: CBC  Basename 02/04/12 1951 02/04/12 0645  WBC 14.1* 12.6*  HGB 8.1* 8.8*  HCT 24.1* 25.8*  PLT 179 197    BMET  Basename 02/05/12 0416 02/04/12 1951  NA 137 138  K 5.0 4.4  CL 103 102  CO2 25 25  GLUCOSE 237* 73  BUN 97* 96*  CREATININE 2.55* 2.56*  CALCIUM 8.4 8.5   Trend  Creat  5/20                                                                              5/26  2013 1.7==>2.35==>2.65==>1.92==>19.2==>1.42==>1.34   5/26        5/29      5/29        02/05/12 1.34==> 2.85==>3.19 ==>2.55  2012 1.2--2.2  2011 1.0---2.9   Sodium  2013 129 ==>137 BIcarb  12 ==>25  HGb  8.1  MICRO Recent Results (from the past 240 hour(s))  CLOSTRIDIUM DIFFICILE BY PCR     Status: Normal   Collection Time   01/27/12  9:00 PM      Component Value Range Status Comment   C difficile by pcr NEGATIVE  NEGATIVE  Final   CULTURE, BLOOD (ROUTINE X 2)     Status: Normal   Collection Time   01/30/12 10:24 AM      Component Value Range Status Comment   Specimen Description BLOOD RIGHT HAND   Final    Special Requests BOTTLES DRAWN AEROBIC AND ANAEROBIC 6CC    Final    Culture NO GROWTH 5 DAYS   Final    Report Status 02/04/2012 FINAL   Final   CULTURE, BLOOD (ROUTINE X 2)     Status: Normal   Collection Time   01/30/12 10:44 AM      Component Value Range Status Comment   Specimen Description BLOOD LEFT HAND   Final    Special Requests BOTTLES DRAWN AEROBIC AND ANAEROBIC 5CC   Final    Culture NO GROWTH 5 DAYS   Final    Report Status 02/04/2012 FINAL   Final       Lab Results  Component Value Date   CALCIUM 8.4 02/05/2012   CAION 1.25 12/04/2011   PHOS 5.8* 02/05/2012        Impression: 1)Renal  AKI secondary to Prerenal/ ATN  AKI secondary to Hypotension+ sepsis + ACE  AKI on CKD  AKI- NON OLiguric ATN  AKI improving slowly  CKD stage 3 .   CKD since 2011  CKD secondary to HTN/ DM  Progression of CKD -Marked with AKI   2)CVS- Hemodynamically stable   3)Anemia HGb NOT at goal (9--11)  Iron deficincey as Iron less than 10 on 5/24  GI work up-as per Primary team   4)ID Pneumonia  ON ABX -Vanco + Fluconazole+ Levofloxacin  5)DM- Uncontrolled Being managed by Primary team /Endo.  6)Hyponatremia- Secondary to Hypovolemia + SSRI  Now better  7)Acid base  Co2 at goal   8) Resp - Still on Vent Failed weaning  Pulmonology following.  9)Hyperphophatemia- secondary to AKI   Plan:  Will d/w Nutrition to see if we can  other supplement to reduce Phosphorus. If not possible to achnge the supplement-will add Phos Binders. Will continue current tx Will continue to follow Bmet No need of HD        Destinie Thornsberry S 02/05/2012, 6:30 AM

## 2012-02-05 NOTE — Progress Notes (Signed)
RESPIRATORY THERAPY: Patient assessed on ventilator.  Nebulizer treatments given; suctioned small amounts of thin white secretions.  Changed ETTube holder.  Patient placed on weaning per protocol; SBT successful and tolerated CPAP 5PS/5P for over four hours.  Patient wean parameters difficult but successful.  Patient extubated PM to a 40% venturi mask.  Patient had productive cough and maintained O2 saturation in 90's; prn neb treatment given.  Patient desaturated and prompted use of NRB mask with maintenance of saturation in the 90's.  Patient monitored closely.   Henrine Screws RRT

## 2012-02-05 NOTE — Anesthesia Procedure Notes (Signed)
Procedure Name: Intubation Date/Time: 02/05/2012 6:55 PM Performed by: Antony Contras, Hillel Card L Pre-anesthesia Checklist: Patient identified, Patient being monitored, Timeout performed, Emergency Drugs available and Suction available Patient Re-evaluated:Patient Re-evaluated prior to inductionOxygen Delivery Method: Circle System Utilized Preoxygenation: Pre-oxygenation with 100% oxygen Intubation Type: IV induction Ventilation: Mask ventilation without difficulty Laryngoscope Size: 3 and Miller Grade View: Grade I Tube type: Oral Tube size: 8.0 mm Number of attempts: 1 Airway Equipment and Method: stylet Placement Confirmation: ETT inserted through vocal cords under direct vision,  positive ETCO2 and breath sounds checked- equal and bilateral Secured at: 23 cm Tube secured with: Tape Dental Injury: Teeth and Oropharynx as per pre-operative assessment  Comments: O2 sats 96% HR 117 BP 208/96 after inubation

## 2012-02-05 NOTE — Progress Notes (Signed)
Nutrition Follow-up  Diet Order: NPO. Pt remains intubated. Hyperphosphatemia. Potassium trending up also.  Meds: Scheduled Meds:   . albuterol  2.5 mg Nebulization Q4H   And  . ipratropium  0.5 mg Nebulization Q4H  . antiseptic oral rinse  15 mL Mouth Rinse QID  . chlorhexidine  15 mL Mouth Rinse BID  . dextrose      . enoxaparin (LOVENOX) injection  40 mg Subcutaneous Q24H  . famotidine  20 mg Oral Daily  . fluconazole  100 mg Oral Daily  . hydrocortisone sodium succinate  50 mg Intravenous Q6H  . insulin aspart  0-15 Units Subcutaneous Q4H  . insulin NPH  20 Units Subcutaneous Q8H  . levofloxacin (LEVAQUIN) IV  750 mg Intravenous Q48H  . midazolam  2-4 mg Intravenous Once  . pantoprazole (PROTONIX) IV  40 mg Intravenous Daily  . polyethylene glycol  17 g Oral q morning - 10a  . sodium chloride  10-40 mL Intracatheter Q12H  . Tamsulosin HCl  0.4 mg Oral Daily  . traZODone  50 mg Oral QHS  . vancomycin  1,000 mg Intravenous Once  . ziprasidone  40 mg Oral BID WC   Continuous Infusions:   . 0.45 % NaCl with KCl 20 mEq / L 100 mL/hr at 02/05/12 0900  . sodium chloride 20 mL/hr at 02/05/12 0900  . feeding supplement (GLUCERNA 1.2 CAL) 1,000 mL (02/04/12 0913)  . fentaNYL infusion INTRAVENOUS 40 mcg/hr (02/05/12 0900)  . midazolam (VERSED) infusion Stopped (02/05/12 0800)  . DISCONTD: dextrose 5 % and 0.45% NaCl 20 mL/hr at 02/04/12 1300   PRN Meds:.acetaminophen, ALPRAZolam, alum & mag hydroxide-simeth, dextrose, dextrose, DOBUTamine, fentaNYL, fentaNYL, hyoscyamine, midazolam, midazolam, midazolam, morphine injection, norepinephrine (LEVOPHED) Adult infusion, ondansetron, sodium chloride, sodium chloride, DISCONTD: hyoscyamine  Labs:  CMP     Component Value Date/Time   NA 137 02/05/2012 0416   K 5.0 02/05/2012 0416   CL 103 02/05/2012 0416   CO2 25 02/05/2012 0416   GLUCOSE 237* 02/05/2012 0416   BUN 97* 02/05/2012 0416   CREATININE 2.55* 02/05/2012 0416   CALCIUM 8.4 02/05/2012 0416    PROT 5.8* 02/02/2012 0458   ALBUMIN 1.8* 02/02/2012 0458   AST 26 02/02/2012 0458   ALT 22 02/02/2012 0458   ALKPHOS 149* 02/02/2012 0458   BILITOT 0.3 02/02/2012 0458   GFRNONAA 28* 02/05/2012 0416   GFRAA 32* 02/05/2012 0416   Phosphorus  Date/Time Value Range Status  02/05/2012  4:16 AM 5.8* 2.3-4.6 (mg/dL) Final     Intake/Output Summary (Last 24 hours) at 02/05/12 0950 Last data filed at 02/05/12 0900  Gross per 24 hour  Intake   5931 ml  Output   1425 ml  Net   4506 ml    Weight Status: 194# (88kg)  Estimated needs:  1895-2085 kcal per day  102-115 gr protein daily  500 ml plus urine output  Intervention:  Rec change formula to Nepro. Continuous TF start @ 20 ml/hr adv 10 ml q 4h as tol to goal rate of 40 ml/hr. -At current goal Nepro will provide:1728 kcal, 78 gr protein which will meet 89% est energy, 74% protein needs.  Monitor: TF tol, Labs, I/O's   [employer], [employer] Pager [phone]

## 2012-02-05 NOTE — Progress Notes (Signed)
Subjective: He remains intubated and on the ventilator. We are attempting weaning again. He was unable to wean yesterday because of agitation and heart rate problems. He still has some secretions.  Objective: Vital signs in last 24 hours: Temp:  [97.4 F (36.3 C)-98.5 F (36.9 C)] 98.5 F (36.9 C) (06/04 0705) Pulse Rate:  [71-96] 79  (06/04 0755) Resp:  [15-25] 15  (06/04 0755) BP: (126-155)/(54-73) 155/68 mmHg (06/04 0755) SpO2:  [89 %-100 %] 100 % (06/04 0755) FiO2 (%):  [39.7 %-40.4 %] 40 % (06/04 0755) Weight:  [88.1 kg (194 lb 3.6 oz)] 88.1 kg (194 lb 3.6 oz) (06/04 0500) Weight change:  Last BM Date: 02/04/12  Intake/Output from previous day: 06/03 0701 - 06/04 0700 In: 5716.4 [I.V.:4156.4; NG/GT:1350; IV Piggyback:210] Out: 1200 [Urine:1200]  PHYSICAL EXAM General appearance: Intubated and sedated Resp: rhonchi bilaterally Cardio: regular rate and rhythm, S1, S2 normal, no murmur, click, rub or gallop GI: soft, non-tender; bowel sounds normal; no masses,  no organomegaly Extremities: extremities normal, atraumatic, no cyanosis or edema  Lab Results:    Basic Metabolic Panel:  Basename 02/05/12 0416 02/04/12 1951 02/04/12 0645  NA 137 138 --  K 5.0 4.4 --  CL 103 102 --  CO2 25 25 --  GLUCOSE 237* 73 --  BUN 97* 96* --  CREATININE 2.55* 2.56* --  CALCIUM 8.4 8.5 --  MG -- -- --  PHOS 5.8* -- 5.7*   Liver Function Tests: No results found for this basename: AST:2,ALT:2,ALKPHOS:2,BILITOT:2,PROT:2,ALBUMIN:2 in the last 72 hours No results found for this basename: LIPASE:2,AMYLASE:2 in the last 72 hours No results found for this basename: AMMONIA:2 in the last 72 hours CBC:  Basename 02/04/12 1951 02/04/12 0645 02/03/12 1145  WBC 14.1* 12.6* --  NEUTROABS 12.6* -- 14.0*  HGB 8.1* 8.8* --  HCT 24.1* 25.8* --  MCV 81.4 80.6 --  PLT 179 197 --   Cardiac Enzymes: No results found for this basename: CKTOTAL:3,CKMB:3,CKMBINDEX:3,TROPONINI:3 in the last 72  hours BNP: No results found for this basename: PROBNP:3 in the last 72 hours D-Dimer: No results found for this basename: DDIMER:2 in the last 72 hours CBG:  Basename 02/05/12 0802 02/05/12 0407 02/05/12 0013 02/04/12 2006 02/04/12 1926 02/04/12 1616  GLUCAP 171* 209* 75 72 59* 161*   Hemoglobin A1C: No results found for this basename: HGBA1C in the last 72 hours Fasting Lipid Panel: No results found for this basename: CHOL,HDL,LDLCALC,TRIG,CHOLHDL,LDLDIRECT in the last 72 hours Thyroid Function Tests: No results found for this basename: TSH,T4TOTAL,FREET4,T3FREE,THYROIDAB in the last 72 hours Anemia Panel: No results found for this basename: VITAMINB12,FOLATE,FERRITIN,TIBC,IRON,RETICCTPCT in the last 72 hours Coagulation: No results found for this basename: LABPROT:2,INR:2 in the last 72 hours Urine Drug Screen: Drugs of Abuse  No results found for this basename: labopia, cocainscrnur, labbenz, amphetmu, thcu, labbarb    Alcohol Level: No results found for this basename: ETH:2 in the last 72 hours Urinalysis: No results found for this basename: COLORURINE:2,APPERANCEUR:2,LABSPEC:2,PHURINE:2,GLUCOSEU:2,HGBUR:2,BILIRUBINUR:2,KETONESUR:2,PROTEINUR:2,UROBILINOGEN:2,NITRITE:2,LEUKOCYTESUR:2 in the last 72 hours Misc. Labs:  ABGS  Basename 02/05/12 0340  PHART 7.389  PO2ART 93.9  TCO2 22.7  HCO3 24.5*   CULTURES Recent Results (from the past 240 hour(s))  CLOSTRIDIUM DIFFICILE BY PCR     Status: Normal   Collection Time   01/27/12  9:00 PM      Component Value Range Status Comment   C difficile by pcr NEGATIVE  NEGATIVE  Final   CULTURE, BLOOD (ROUTINE X 2)     Status: Normal  Collection Time   01/30/12 10:24 AM      Component Value Range Status Comment   Specimen Description BLOOD RIGHT HAND   Final    Special Requests BOTTLES DRAWN AEROBIC AND ANAEROBIC 6CC    Final    Culture NO GROWTH 5 DAYS   Final    Report Status 02/04/2012 FINAL   Final   CULTURE, BLOOD (ROUTINE  X 2)     Status: Normal   Collection Time   01/30/12 10:44 AM      Component Value Range Status Comment   Specimen Description BLOOD LEFT HAND   Final    Special Requests BOTTLES DRAWN AEROBIC AND ANAEROBIC 5CC   Final    Culture NO GROWTH 5 DAYS   Final    Report Status 02/04/2012 FINAL   Final    Studies/Results: Dg Chest Port 1 View  02/05/2012  *RADIOLOGY REPORT*  Clinical Data: Respiratory failure  PORTABLE CHEST - 1 VIEW  Comparison: 02/04/2012; 02/03/2012; 02/02/2012  Findings: Unchanged cardiac silhouette and mediastinal contours. Interval retraction of enteric tube with tip now projecting over the thoracic inlet.  Unchanged position of endotracheal tube with tip overlying the tracheal air column, tip above the carina.  Left subclavian approach central venous catheter tip overlies the superior aspect of the SVC. No significant interval change in extensive bilateral heterogeneous air space opacities, left greater than right.  No new focal airspace opacity.  No definite pleural effusion or pneumothorax.  Unchanged bones.  IMPRESSION: 1.  Interval retraction of the enteric tube with tip now overlying the thoracic inlet.  Repositioning is advised.  2.  Otherwise, stable position of support apparatus.  No pneumothorax. 3.  Grossly unchanged extensive bilateral airspace disease, left greater than right, worrisome for multifocal infection.  Original Report Authenticated By: Rachel Moulds, M.D.   Dg Chest Port 1 View  02/04/2012  *RADIOLOGY REPORT*  Clinical Data: Respiratory failure.  Mental retardation.  Diabetes. Renal failure.  PORTABLE CHEST - 1 VIEW  Comparison: 1 day prior and back to 09/18/2011  Findings: Endotracheal tube appropriately position.  Nasogastric tube extends beyond the  inferior aspect of the film.  Left subclavian central line tip SVC.  Normal heart size.  No pleural effusion or pneumothorax.  Left greater than right bilateral airspace disease is minimally improved, especially on  the left.  Relative sparing of the right apex and right lung base.  IMPRESSION: Slight improvement in pneumonia versus asymmetric pulmonary edema.  Original Report Authenticated By: Areta Haber, M.D.    Medications:  Scheduled:   . albuterol  2.5 mg Nebulization Q4H   And  . ipratropium  0.5 mg Nebulization Q4H  . antiseptic oral rinse  15 mL Mouth Rinse QID  . chlorhexidine  15 mL Mouth Rinse BID  . dextrose      . enoxaparin (LOVENOX) injection  40 mg Subcutaneous Q24H  . famotidine  20 mg Oral Daily  . fluconazole  100 mg Oral Daily  . hydrocortisone sodium succinate  50 mg Intravenous Q6H  . insulin aspart  0-15 Units Subcutaneous Q4H  . insulin NPH  20 Units Subcutaneous Q8H  . levofloxacin (LEVAQUIN) IV  750 mg Intravenous Q48H  . midazolam  2-4 mg Intravenous Once  . pantoprazole (PROTONIX) IV  40 mg Intravenous Daily  . polyethylene glycol  17 g Oral q morning - 10a  . sodium chloride  10-40 mL Intracatheter Q12H  . Tamsulosin HCl  0.4 mg Oral Daily  .  traZODone  50 mg Oral QHS  . vancomycin  1,000 mg Intravenous Once  . ziprasidone  40 mg Oral BID WC   Continuous:   . 0.45 % NaCl with KCl 20 mEq / L 100 mL/hr at 02/05/12 0500  . sodium chloride 20 mL/hr at 02/05/12 0500  . feeding supplement (GLUCERNA 1.2 CAL) 1,000 mL (02/04/12 0913)  . fentaNYL infusion INTRAVENOUS 80 mcg/hr (02/04/12 1836)  . midazolam (VERSED) infusion 5 mg/hr (02/05/12 0500)  . DISCONTD: dextrose 5 % and 0.45% NaCl 20 mL/hr at 02/04/12 1300   [id number], ALPRAZolam, alum & mag hydroxide-simeth, dextrose, dextrose, DOBUTamine, fentaNYL, fentaNYL, hyoscyamine, midazolam, midazolam, midazolam, morphine injection, norepinephrine (LEVOPHED) Adult infusion, ondansetron, sodium chloride, sodium chloride, DISCONTD: hyoscyamine  Assesment: He has respiratory failure on the basis of sepsis. He apparently aspirated and had bilateral pneumonia. He has multiple other medical problems including acute  on chronic renal failure. He has chronic mental retardation. He has had some problems with his tube feedings in his feeding tube is not in the proper position at this point. He failed weaning yesterday were going to try again today Active Problems:  * No active hospital problems. *     Plan: Attempt weaning again reposition the NG tube continue with all the other treatments.    LOS: 15 days   Bettejane Leavens L 02/05/2012, 8:15 AM

## 2012-02-05 NOTE — Progress Notes (Signed)
Patient ID: [name], male   DOB: [birth year], 51 y.o.   MRN: [id number] [phone]

## 2012-02-06 ENCOUNTER — Inpatient Hospital Stay (HOSPITAL_COMMUNITY): Payer: Medicaid Other

## 2012-02-06 ENCOUNTER — Encounter (HOSPITAL_COMMUNITY): Payer: Medicaid Other

## 2012-02-06 LAB — BASIC METABOLIC PANEL
BUN: 93 mg/dL — ABNORMAL HIGH (ref 6–23)
Chloride: 105 mEq/L (ref 96–112)
Creatinine, Ser: 2.33 mg/dL — ABNORMAL HIGH (ref 0.50–1.35)
GFR calc non Af Amer: 31 mL/min — ABNORMAL LOW (ref >90)
Glucose, Bld: 74 mg/dL (ref 70–99)
Potassium: 4.4 mEq/L (ref 3.5–5.1)

## 2012-02-06 LAB — GLUCOSE, CAPILLARY
Glucose-Capillary: 108 mg/dL — ABNORMAL HIGH (ref 70–99)
Glucose-Capillary: 48 mg/dL — ABNORMAL LOW (ref 70–99)
Glucose-Capillary: 68 mg/dL — ABNORMAL LOW (ref 70–99)
Glucose-Capillary: 69 mg/dL — ABNORMAL LOW (ref 70–99)
Glucose-Capillary: 79 mg/dL (ref 70–99)
Glucose-Capillary: 81 mg/dL (ref 70–99)

## 2012-02-06 MED ORDER — DEXTROSE 50 % IV SOLN
INTRAVENOUS | Status: AC
  Administered 2012-02-06: 25 mL via INTRAVENOUS
  Filled 2012-02-06: qty 50

## 2012-02-06 MED ORDER — SODIUM CHLORIDE 0.9 % IJ SOLN
10.0000 mL | Freq: Two times a day (BID) | INTRAMUSCULAR | Status: DC
  Administered 2012-02-06 – 2012-02-15 (×13): 10 mL
  Administered 2012-02-16: 20 mL
  Administered 2012-02-16 – 2012-02-19 (×6): 10 mL
  Administered 2012-02-20: 20 mL
  Administered 2012-02-21 – 2012-02-22 (×2): 10 mL
  Administered 2012-02-22: 20 mL
  Administered 2012-02-23: 40 mL
  Filled 2012-02-06 (×7): qty 3
  Filled 2012-02-06 (×2): qty 6
  Filled 2012-02-06 (×2): qty 3
  Filled 2012-02-06: qty 12
  Filled 2012-02-06: qty 3
  Filled 2012-02-06: qty 9
  Filled 2012-02-06 (×7): qty 3

## 2012-02-06 MED ORDER — DEXTROSE 50 % IV SOLN
INTRAVENOUS | Status: AC
  Administered 2012-02-06: 50 mL
  Filled 2012-02-06: qty 50

## 2012-02-06 MED ORDER — DEXTROSE 50 % IV SOLN
INTRAVENOUS | Status: AC
  Administered 2012-02-06: 25 mL
  Filled 2012-02-06: qty 50

## 2012-02-06 MED ORDER — PANTOPRAZOLE SODIUM 40 MG PO PACK
40.0000 mg | PACK | Freq: Every day | ORAL | Status: DC
  Administered 2012-02-06 – 2012-02-12 (×7): 40 mg
  Filled 2012-02-06 (×8): qty 20

## 2012-02-06 MED ORDER — FUROSEMIDE 10 MG/ML IJ SOLN
40.0000 mg | Freq: Two times a day (BID) | INTRAMUSCULAR | Status: DC
  Administered 2012-02-06 – 2012-02-07 (×3): 40 mg via INTRAVENOUS
  Filled 2012-02-06 (×3): qty 4

## 2012-02-06 MED ORDER — MIDAZOLAM HCL 50 MG/10ML IJ SOLN
INTRAMUSCULAR | Status: AC
  Filled 2012-02-06: qty 1

## 2012-02-06 MED ORDER — INSULIN NPH (HUMAN) (ISOPHANE) 100 UNIT/ML ~~LOC~~ SUSP
5.0000 [IU] | Freq: Three times a day (TID) | SUBCUTANEOUS | Status: DC
  Administered 2012-02-06 – 2012-02-07 (×2): 5 [IU] via SUBCUTANEOUS

## 2012-02-06 MED ORDER — SODIUM CHLORIDE 0.9 % IJ SOLN
10.0000 mL | INTRAMUSCULAR | Status: DC | PRN
  Administered 2012-02-10 – 2012-02-18 (×3): 10 mL
  Administered 2012-02-21: 30 mL
  Filled 2012-02-06 (×7): qty 3
  Filled 2012-02-06 (×3): qty 6
  Filled 2012-02-06: qty 3
  Filled 2012-02-06: qty 6
  Filled 2012-02-06: qty 3
  Filled 2012-02-06: qty 6
  Filled 2012-02-06: qty 3

## 2012-02-06 MED ORDER — DEXTROSE 50 % IV SOLN
25.0000 mL | Freq: Once | INTRAVENOUS | Status: AC | PRN
  Administered 2012-02-06: 25 mL via INTRAVENOUS

## 2012-02-06 MED ORDER — INSULIN NPH (HUMAN) (ISOPHANE) 100 UNIT/ML ~~LOC~~ SUSP
10.0000 [IU] | Freq: Three times a day (TID) | SUBCUTANEOUS | Status: DC
  Filled 2012-02-06: qty 10

## 2012-02-06 NOTE — Progress Notes (Signed)
Patient ID: [name], male   DOB: 01/20/1961, 51 y.o.   MRN: [id number]

## 2012-02-06 NOTE — Progress Notes (Signed)
Subjective: He was extubated yesterday but about 5 or 6 hours later had to be reintubated. He is intubated and on the ventilator. Tube feedings are underway.  Objective: Vital signs in last 24 hours: Temp:  [97.2 F (36.2 C)-98 F (36.7 C)] 97.6 F (36.4 C) (06/05 0400) Pulse Rate:  [73-118] 79  (06/05 0630) Resp:  [9-26] 12  (06/05 0630) BP: (147-209)/(64-95) 159/69 mmHg (06/05 0630) SpO2:  [86 %-100 %] 98 % (06/05 0714) FiO2 (%):  [40 %-100 %] 40 % (06/05 0743) Weight:  [94.6 kg (208 lb 8.9 oz)] 94.6 kg (208 lb 8.9 oz) (06/05 0500) Weight change: 6.5 kg (14 lb 5.3 oz) Last BM Date: 02/04/12  Intake/Output from previous day: 06/04 0701 - 06/05 0700 In: 3603.7 [I.V.:3223.7; NG/GT:170; IV Piggyback:150] Out: 2375 [Urine:2375]  PHYSICAL EXAM General appearance: Intubated on the ventilator and sedated Resp: rhonchi bilaterally Cardio: regular rate and rhythm, S1, S2 normal, no murmur, click, rub or gallop GI: soft, non-tender; bowel sounds normal; no masses,  no organomegaly Extremities: extremities normal, atraumatic, no cyanosis or edema  Lab Results:    Basic Metabolic Panel:  Basename 02/06/12 0347 02/05/12 1953 02/05/12 0416 02/04/12 0645  NA 140 139 -- --  K 4.4 4.2 -- --  CL 105 104 -- --  CO2 25 25 -- --  GLUCOSE 74 83 -- --  BUN 93* 95* -- --  CREATININE 2.33* 2.40* -- --  CALCIUM 8.4 8.5 -- --  MG -- -- -- --  PHOS -- -- 5.8* 5.7*   Liver Function Tests: No results found for this basename: AST:2,ALT:2,ALKPHOS:2,BILITOT:2,PROT:2,ALBUMIN:2 in the last 72 hours No results found for this basename: LIPASE:2,AMYLASE:2 in the last 72 hours No results found for this basename: AMMONIA:2 in the last 72 hours CBC:  Basename 02/04/12 1951 02/04/12 0645 02/03/12 1145  WBC 14.1* 12.6* --  NEUTROABS 12.6* -- 14.0*  HGB 8.1* 8.8* --  HCT 24.1* 25.8* --  MCV 81.4 80.6 --  PLT 179 197 --   Cardiac Enzymes: No results found for this basename:  CKTOTAL:3,CKMB:3,CKMBINDEX:3,TROPONINI:3 in the last 72 hours BNP: No results found for this basename: PROBNP:3 in the last 72 hours D-Dimer: No results found for this basename: DDIMER:2 in the last 72 hours CBG:  Basename 02/06/12 0753 02/06/12 0427 02/06/12 0050 02/06/12 0034 02/06/12 0004 02/05/12 1945  GLUCAP 113* 69* 108* 68* 48* 83   Hemoglobin A1C: No results found for this basename: HGBA1C in the last 72 hours Fasting Lipid Panel: No results found for this basename: CHOL,HDL,LDLCALC,TRIG,CHOLHDL,LDLDIRECT in the last 72 hours Thyroid Function Tests:  Basename 02/04/12 1543  TSH 0.072*  T4TOTAL --  FREET4 0.81  T3FREE --  THYROIDAB --   Anemia Panel: No results found for this basename: VITAMINB12,FOLATE,FERRITIN,TIBC,IRON,RETICCTPCT in the last 72 hours Coagulation: No results found for this basename: LABPROT:2,INR:2 in the last 72 hours Urine Drug Screen: Drugs of Abuse  No results found for this basename: labopia, cocainscrnur, labbenz, amphetmu, thcu, labbarb    Alcohol Level: No results found for this basename: ETH:2 in the last 72 hours Urinalysis: No results found for this basename: COLORURINE:2,APPERANCEUR:2,LABSPEC:2,PHURINE:2,GLUCOSEU:2,HGBUR:2,BILIRUBINUR:2,KETONESUR:2,PROTEINUR:2,UROBILINOGEN:2,NITRITE:2,LEUKOCYTESUR:2 in the last 72 hours Misc. Labs:  ABGS  Basename 02/05/12 1900  PHART 7.291*  PO2ART 203.0*  TCO2 23.1  HCO3 24.2*   CULTURES Recent Results (from the past 240 hour(s))  CLOSTRIDIUM DIFFICILE BY PCR     Status: Normal   Collection Time   01/27/12  9:00 PM      Component Value Range Status  Comment   C difficile by pcr NEGATIVE  NEGATIVE  Final   CULTURE, BLOOD (ROUTINE X 2)     Status: Normal   Collection Time   01/30/12 10:24 AM      Component Value Range Status Comment   Specimen Description BLOOD RIGHT HAND   Final    Special Requests BOTTLES DRAWN AEROBIC AND ANAEROBIC 6CC    Final    Culture NO GROWTH 5 DAYS   Final     Report Status 02/04/2012 FINAL   Final   CULTURE, BLOOD (ROUTINE X 2)     Status: Normal   Collection Time   01/30/12 10:44 AM      Component Value Range Status Comment   Specimen Description BLOOD LEFT HAND   Final    Special Requests BOTTLES DRAWN AEROBIC AND ANAEROBIC 5CC   Final    Culture NO GROWTH 5 DAYS   Final    Report Status 02/04/2012 FINAL   Final    Studies/Results: Portable Chest Xray  02/05/2012  *RADIOLOGY REPORT*  Clinical Data: Intubation.  PORTABLE CHEST - 1 VIEW 02/05/2012 1905 hours:  Comparison: Portable chest x-ray earlier same date 0701 hours.  Findings: Endotracheal tube tip approximately 2 cm above the carina.  Left subclavian central venous catheter tip in the left innominate vein, unchanged.  Interval marked worsening of now severe airspace consolidation throughout both lungs, left greater than right.  Mild gaseous distention of the visualized stomach.  IMPRESSION:  1.  Endotracheal tube tip approximately 2 cm above the carina. 2.  Marked worsening of diffuse pneumonia throughout both lungs, left greater than right, since earlier today. 3.  Mild gaseous distention of the stomach.  Original Report Authenticated By: Deniece Portela, M.D.   Dg Chest Port 1 View  02/05/2012  *RADIOLOGY REPORT*  Clinical Data: Respiratory failure  PORTABLE CHEST - 1 VIEW  Comparison: 02/04/2012; 02/03/2012; 02/02/2012  Findings: Unchanged cardiac silhouette and mediastinal contours. Interval retraction of enteric tube with tip now projecting over the thoracic inlet.  Unchanged position of endotracheal tube with tip overlying the tracheal air column, tip above the carina.  Left subclavian approach central venous catheter tip overlies the superior aspect of the SVC. No significant interval change in extensive bilateral heterogeneous air space opacities, left greater than right.  No new focal airspace opacity.  No definite pleural effusion or pneumothorax.  Unchanged bones.  IMPRESSION: 1.   Interval retraction of the enteric tube with tip now overlying the thoracic inlet.  Repositioning is advised.  2.  Otherwise, stable position of support apparatus.  No pneumothorax. 3.  Grossly unchanged extensive bilateral airspace disease, left greater than right, worrisome for multifocal infection.  Original Report Authenticated By: Rachel Moulds, M.D.    Medications:  Scheduled:   . albuterol  2.5 mg Nebulization Q4H   And  . ipratropium  0.5 mg Nebulization Q4H  . antiseptic oral rinse  15 mL Mouth Rinse QID  . chlorhexidine  15 mL Mouth Rinse BID  . dextrose      . dextrose      . enoxaparin (LOVENOX) injection  40 mg Subcutaneous Q24H  . etomidate      . famotidine  20 mg Oral Daily  . fluconazole  100 mg Oral Daily  . hydrocortisone sodium succinate  50 mg Intravenous Q6H  . insulin aspart  0-15 Units Subcutaneous Q4H  . insulin NPH  15 Units Subcutaneous Q8H  . levofloxacin (LEVAQUIN) IV  750 mg  Intravenous Q48H  . lidocaine (cardiac) 100 mg/62ml      . midazolam  2-4 mg Intravenous Once  . pantoprazole (PROTONIX) IV  40 mg Intravenous Daily  . polyethylene glycol  17 g Oral q morning - 10a  . rocuronium      . sodium chloride  10-40 mL Intracatheter Q12H  . succinylcholine      . Tamsulosin HCl  0.4 mg Oral Daily  . traZODone  50 mg Oral QHS  . ziprasidone  40 mg Oral BID WC  . DISCONTD: albuterol  2.5 mg Nebulization Q4H  . DISCONTD: insulin NPH  20 Units Subcutaneous Q8H  . DISCONTD: ipratropium  0.5 mg Nebulization Q4H  . DISCONTD: pantoprazole (PROTONIX) IV  40 mg Intravenous Daily   Continuous:   . 0.45 % NaCl with KCl 20 mEq / L 100 mL/hr at 02/06/12 0600  . dextrose 5 % and 0.45% NaCl 50 mL/hr at 02/06/12 0600  . feeding supplement (NEPRO CARB STEADY) 1,000 mL (02/05/12 1104)  . fentaNYL infusion INTRAVENOUS 150 mcg/hr (02/06/12 0600)  . midazolam (VERSED) infusion 6 mg/hr (02/06/12 0600)  . DISCONTD: sodium chloride 20 mL/hr at 02/05/12 0900  . DISCONTD:  feeding supplement (GLUCERNA 1.2 CAL) 1,000 mL (02/04/12 0913)   [id number], albuterol, ALPRAZolam, alum & mag hydroxide-simeth, dextrose, dextrose, DOBUTamine, fentaNYL, fentaNYL, hyoscyamine, midazolam, midazolam, midazolam, morphine injection, norepinephrine (LEVOPHED) Adult infusion, ondansetron, sodium chloride, sodium chloride  Assesment: He has respiratory failure. He was able to be extubated yesterday but was not able to stay off the ventilator. He was septic but I think that has resolved. He has bilateral infiltrates on chest x-ray. He has a central line in but I think we should get that out and put in a PICC line. Active Problems:  * No active hospital problems. *     Plan:  PICC line placement, remove central line stop CVP measurements .    LOS: 16 days   Quantia Grullon L 02/06/2012, 8:13 AM

## 2012-02-06 NOTE — Progress Notes (Signed)
Patient is q4hr cbg monitoring. Patient's 12am blood sugar read 48. Hypoglycemia protocol followed. D50 given. Recheck of cbg revealed blood sugar of 68. Another 44ml of d50 given. Will recheck in 67mins.

## 2012-02-06 NOTE — Progress Notes (Signed)
The patient is receiving Protonix by the intravenous route.  Based on criteria approved by the Pharmacy and [place], the medication is being converted to the equivalent oral dose form.  These criteria include: -No Active GI bleeding -Able to tolerate diet of full liquids (or better) or tube feeding OR able to tolerate other medications by the oral or enteral route  * pt is also on PO Pepcid *  If you have any questions about this conversion, please contact the Pharmacy Department (ext [phone]).  Thank you.  Ena Dawley, [place] 02/06/2012 10:24 AM

## 2012-02-06 NOTE — Progress Notes (Signed)
Recheck of blood sugar is 108. Will continue to monitor closely

## 2012-02-06 NOTE — Progress Notes (Signed)
[name]  MRN: [id number]  DOB/AGE: 50/24/1962 50 y.o.  [name], MD, MD  Admit date: 01/21/2012  Chief Complaint:  Chief Complaint  Patient presents with  . Hypoglycemia    S-Pt presented on  01/21/2012 with  Chief Complaint  Patient presents with  . Hypoglycemia  Pt re intubated.  Meds    . albuterol  2.5 mg Nebulization Q4H   And  . ipratropium  0.5 mg Nebulization Q4H  . antiseptic oral rinse  15 mL Mouth Rinse QID  . chlorhexidine  15 mL Mouth Rinse BID  . dextrose      . dextrose      . dextrose      . dextrose      . enoxaparin (LOVENOX) injection  40 mg Subcutaneous Q24H  . etomidate      . famotidine  20 mg Oral Daily  . fluconazole  100 mg Oral Daily  . hydrocortisone sodium succinate  50 mg Intravenous Q6H  . insulin aspart  0-15 Units Subcutaneous Q4H  . insulin NPH  10 Units Subcutaneous Q8H  . levofloxacin (LEVAQUIN) IV  750 mg Intravenous Q48H  . lidocaine (cardiac) 100 mg/52ml      . midazolam  2-4 mg Intravenous Once  . pantoprazole sodium  40 mg Per Tube Q1200  . polyethylene glycol  17 g Oral q morning - 10a  . rocuronium      . sodium chloride  10-40 mL Intracatheter Q12H  . sodium chloride  10-40 mL Intracatheter Q12H  . succinylcholine      . Tamsulosin HCl  0.4 mg Oral Daily  . traZODone  50 mg Oral QHS  . ziprasidone  40 mg Oral BID WC  . DISCONTD: albuterol  2.5 mg Nebulization Q4H  . DISCONTD: insulin NPH  15 Units Subcutaneous Q8H  . DISCONTD: ipratropium  0.5 mg Nebulization Q4H  . DISCONTD: pantoprazole (PROTONIX) IV  40 mg Intravenous Daily  . DISCONTD: pantoprazole (PROTONIX) IV  40 mg Intravenous Daily           Physical Exam: Vital signs in last 24 hours: Temp:  [97.2 F (36.2 C)-97.6 F (36.4 C)] 97.5 F (36.4 C) (06/05 1200) Pulse Rate:  [75-118] 89  (06/05 1530) Resp:  [9-40] 12  (06/05 1530) BP: (137-209)/(61-95) 168/87 mmHg (06/05 1516) SpO2:  [86 %-100 %] 92 % (06/05  1530) FiO2 (%):  [39.7 %-100 %] 39.9 % (06/05 1530) Weight:  [208 lb 8.9 oz (94.6 kg)] 208 lb 8.9 oz (94.6 kg) (06/05 0500) Weight change: 14 lb 5.3 oz (6.5 kg) Last BM Date: 02/06/12  Intake/Output from previous day: 06/04 0701 - 06/05 0700 In: 3603.7 [I.V.:3223.7; NG/GT:170; IV Piggyback:150] Out: 2375 [Urine:2375] Total I/O In: 1243.5 [I.V.:1083.5; NG/GT:160] Out: [id number] [Urine:1075]   Physical Exam: General- pt is intubated Resp-B/L Rhonchi CVS- S1S2 regular in rate and rhythm GIT- BS+, soft, NT, ND EXT- 1+ LE Edema, Cyanosis   Lab Results: CBC  Basename 02/04/12 1951 02/04/12 0645  WBC 14.1* 12.6*  HGB 8.1* 8.8*  HCT 24.1* 25.8*  PLT 179 197    BMET  Basename 02/06/12 0347 02/05/12 1953  NA 140 139  K 4.4 4.2  CL 105 104  CO2 25 25  GLUCOSE 74 83  BUN 93* 95*  CREATININE 2.33* 2.40*  CALCIUM 8.4 8.5    Creat  5/20 5/26  2013 1.7==>2.35==>2.65==>1.92==>19.2==>1.42==>1.34  5/26       5/29       5/29  02/05/12     02/06/12 1.34==> 2.85==>3.19 ==>2.55 ==>2.33 2012 1.2--2.2  2011 1.0---2.9    MICRO Recent Results (from the past 240 hour(s))  CLOSTRIDIUM DIFFICILE BY PCR     Status: Normal   Collection Time   01/27/12  9:00 PM      Component Value Range Status Comment   C difficile by pcr NEGATIVE  NEGATIVE  Final   CULTURE, BLOOD (ROUTINE X 2)     Status: Normal   Collection Time   01/30/12 10:24 AM      Component Value Range Status Comment   Specimen Description BLOOD RIGHT HAND   Final    Special Requests BOTTLES DRAWN AEROBIC AND ANAEROBIC 6CC    Final    Culture NO GROWTH 5 DAYS   Final    Report Status 02/04/2012 FINAL   Final   CULTURE, BLOOD (ROUTINE X 2)     Status: Normal   Collection Time   01/30/12 10:44 AM      Component Value Range Status Comment   Specimen Description BLOOD LEFT HAND   Final    Special Requests BOTTLES DRAWN AEROBIC AND ANAEROBIC 5CC   Final    Culture NO GROWTH 5 DAYS   Final    Report Status 02/04/2012 FINAL    Final       Lab Results  Component Value Date   CALCIUM 8.4 02/06/2012   CAION 1.25 12/04/2011   PHOS 5.8* 02/05/2012               Impression: 1)Renal  AKI secondary to Prerenal/ ATN  AKI secondary to Hypotension+ sepsis + ACE  AKI on CKD  AKI- NON OLiguric ATN  AKI improving slowly  CKD stage 3 .  CKD since 2011  CKD secondary to HTN/ DM  Progression of CKD -Marked with AKI   2)CVS- Hemodynamically stable   3)Anemia HGb NOT at goal (9--11)  Iron deficincey as Iron less than 10 on 5/24  GI work up-as per Primary team   4)ID Pneumonia  ON ABX -Vanco + Fluconazole+ Levofloxacin   5)DM- Uncontrolled  Being managed by Primary team /Endo.   6)Hyponatremia- Secondary to Hypovolemia + SSRI  Now better   7)Acid base  Co2 at goal   8) Resp - Still on Vent  Failed weaning  Pulmonology following.   9)Hyperphophatemia- secondary to AKI  Glucerna d/ced and now on Nepro  Plan:  Will follow Phos-will hold off binders till we recheck the phos level. Will start lasix 40mg  po bID. will follow Bmet.    [name] S 02/06/2012, 3:50 PM

## 2012-02-06 NOTE — Progress Notes (Signed)
[name], [name]           ACCOUNT NO.:  [id number]  MEDICAL RECORD NO.:  [id number]  LOCATION:                                 FACILITY:  PHYSICIAN:  Loni Beckwith, MD DATE OF BIRTH:  [birth year]  DATE OF PROCEDURE:  02/05/2012 DATE OF DISCHARGE:                                PROGRESS NOTE   REASON FOR FOLLOW UP:  Uncontrolled type 1 diabetes.  SUBJECTIVE:  The patient is currently on immediate post extubation and appears that his tube feeding is discontinued.  No hypoglycemia.  His blood glucose ranged from 75 to 200 over the last 24 hours.  OBJECTIVE:  GENERAL:  He is sleepy, but arousable.  VITAL SIGNS:  His blood pressure is 161/73, pulse rate 83, temperature 98.  GENERAL:  He is responding to touch and sound stimulus.  CHEST:  Significant for diffuse rhonchi.  CARDIOVASCULAR:  Distant heart sounds.  No murmur.  No gallop.  ABDOMEN:  Soft.  Bowel sounds present.  EXTREMITIES:  No edema.  His CBG ranges from 75 to 200.  His most recent chemistry showed sodium 137, potassium 5.0, chloride 103, bicarb 25, BUN 97, creatinine 2.5.  ASSESSMENT: 1. Diabetes type 1, chronically uncontrolled. 2. Respiratory failure, currently status post extubation. 3. Bilateral pneumonia contributing to respiratory failure. 4. Acute kidney injury. 5. Anemia. 6. History of mental retardation.  PLAN:  Since his tube feeding is discontinued, he will not tolerate the current regimen of insulin, hence I will decrease NPH to 15 units every 8 hours; however, I will reinstitute his dextrose 5% in half normal saline until some kind of feeding mechanism is established.  He will need fingersticks every 4 hours and I will re-evaluate tomorrow.          ______________________________ Loni Beckwith, MD     GN/MEDQ  D:  02/05/2012  T:  02/05/2012  Job:  [id number]

## 2012-02-06 NOTE — Progress Notes (Signed)
cbg 69. Additional amp d50 given. Will recheck cbg in 15 mins

## 2012-02-06 NOTE — Progress Notes (Signed)
[name], [name]           ACCOUNT NO.:  [id number]  MEDICAL RECORD NO.:  [id number]  LOCATION:  IC03                          FACILITY:  [facility]  PHYSICIAN:  Loni Beckwith, MD DATE OF BIRTH:  May 02, 1961  DATE OF PROCEDURE:  02/06/2012 DATE OF DISCHARGE:                                PROGRESS NOTE   REASON FOR FOLLOW UP:  Uncontrolled type 1 diabetes.  SUBJECTIVE:  Events over the last 24 hours have been reviewed.  The patient had to be reintubated due to respiratory failure after approximately 6-7 hours of extubation.  Tube feedings have been reinstated at Nepro 40 mL per hour.  OBJECTIVE:  VITAL SIGNS:  Blood pressure is 137/61, pulse rate 88, temperature 97.5. GENERAL:  Sick looking, remains on mechanical ventilation. CHEST:  Bilateral rhonchi. CARDIOVASCULAR:  Distant heart sound. ABDOMEN:  Positive bowel sounds. EXTREMITIES:  No edema.  LAB WORK: Sodium 140, potassium 4.4, chloride 105, bicarb 25, BUN 93, creatinine 2.3, calcium 8.4.  ASSESSMENT: 1. Type 1 diabetes chronically uncontrolled. 2. Respiratory failure, failed extubation, requiring mechanical     ventilation. 3. Acute kidney injury. 4. Anemia. 5. History of mental retardation.  PLAN:  Decrease NPH to 10 units every 8 hours plus sliding scale, however, this patient rebounds to blood glucose readings above 200.  He will need glucose stabilizer.  He will need continued monitoring at least every 4 hours.  His mild hypoglycemia earlier today was due to interruption of his tube feedings for procedure.  I will continue to follow as an inpatient and recommend future insulin titration plans accordingly.          ______________________________ Loni Beckwith, MD     GN/MEDQ  D:  02/06/2012  T:  02/06/2012  Job:  [id number]

## 2012-02-07 ENCOUNTER — Inpatient Hospital Stay (HOSPITAL_COMMUNITY): Payer: Medicaid Other

## 2012-02-07 LAB — VANCOMYCIN, RANDOM: Vancomycin Rm: 14.7 ug/mL

## 2012-02-07 LAB — BASIC METABOLIC PANEL
CO2: 25 mEq/L (ref 19–32)
Calcium: 8.3 mg/dL — ABNORMAL LOW (ref 8.4–10.5)
GFR calc non Af Amer: 33 mL/min — ABNORMAL LOW (ref >90)
Sodium: 141 mEq/L (ref 135–145)

## 2012-02-07 LAB — GLUCOSE, CAPILLARY: Glucose-Capillary: 275 mg/dL — ABNORMAL HIGH (ref 70–99)

## 2012-02-07 MED ORDER — INSULIN NPH (HUMAN) (ISOPHANE) 100 UNIT/ML ~~LOC~~ SUSP
8.0000 [IU] | Freq: Three times a day (TID) | SUBCUTANEOUS | Status: DC
  Administered 2012-02-07 – 2012-02-12 (×15): 8 [IU] via SUBCUTANEOUS
  Filled 2012-02-07 (×2): qty 10

## 2012-02-07 MED ORDER — MIDAZOLAM HCL 50 MG/10ML IJ SOLN
INTRAMUSCULAR | Status: AC
  Filled 2012-02-07: qty 1

## 2012-02-07 MED ORDER — VANCOMYCIN HCL 1000 MG IV SOLR
750.0000 mg | INTRAVENOUS | Status: DC
  Administered 2012-02-07 – 2012-02-09 (×2): 750 mg via INTRAVENOUS
  Filled 2012-02-07 (×3): qty 750

## 2012-02-07 NOTE — Progress Notes (Signed)
NAME:  [name], [name]           ACCOUNT NO.:  [id number]  MEDICAL RECORD NO.:  [id number]  LOCATION:                                 FACILITY:  PHYSICIAN:  Tawfiq Favila D. Legrand Rams, MD   DATE OF BIRTH:  14-May-1961  DATE OF PROCEDURE:  02/07/2012 DATE OF DISCHARGE:                                PROGRESS NOTE   SUBJECTIVE:  The patient remained orally intubated.  The patient was tried to be extubated.  However, he developed respiratory distress, and he was re-intubated.  He is receiving NG-tube feeding.  The patient is unable to communicate orally.  OBJECTIVE:  GENERAL: The patient is on ventilatory support.  He is just opening his eyes. VITAL SIGNS: Blood pressure 137/61, pulse 73, respiratory rate 18, temperature 97.3 degrees Fahrenheit. CHEST: Poor air entry, bilateral rhonchi.  CARDIOVASCULAR SYSTEM: First and second heart sounds heard.  No murmur.  No gallop. ABDOMEN: Soft and lax.  Bowel sounds positive.  No mass or organomegaly. EXTREMITIES: No leg edema.  LABS:  Sodium 141, potassium 3.9, chloride 104, carbon dioxide 25, glucose 254, BUN 87, creatinine 2.2, calcium 8.3.  ASSESSMENT: 1. Ventilatory-dependent respiratory failure, unable to wean. 2. Acute on chronic renal failure. 3. Bilateral pneumonia. 4. Anemia. 5. Mental retardation.  PLAN:  We will continue the patient on ventilatory support.  Continue fluid and electrolyte management according to Nephrology.  We will continue NG tube feeding.  We will continue current treatment and supportive care.     Osama Coleson D. Legrand Rams, MD     TDF/MEDQ  D:  02/07/2012  T:  02/07/2012  Job:  [id number]

## 2012-02-07 NOTE — Progress Notes (Signed)
Subjective: Interval History: none.  Objective: Vital signs in last 24 hours: Temp:  [97.3 F (36.3 C)-98.4 F (36.9 C)] 98.4 F (36.9 C) (06/06 0400) Pulse Rate:  [73-103] 103  (06/06 0500) Resp:  [8-40] 22  (06/06 0500) BP: (137-171)/(57-87) 155/68 mmHg (06/06 0500) SpO2:  [87 %-100 %] 90 % (06/06 0500) FiO2 (%):  [39.7 %-50.5 %] 44.8 % (06/06 0500) Weight:  [86.1 kg (189 lb 13.1 oz)] 86.1 kg (189 lb 13.1 oz) (06/06 0500) Weight change: -8.5 kg (-18 lb 11.8 oz)  Intake/Output from previous day: 06/05 0701 - 06/06 0700 In: 4023.5 [I.V.:3443.5; NG/GT:570; IV Piggyback:10] Out: 3975 [Urine:3975] Intake/Output this shift:    Patient remain intubated and also sedated. Chest has bilateral inspiratory crackles and wheezing. Heart regular rate and rhythm Abdomen soft and nontender extremities trace to 1+ edema.  Lab Results:  [facility] 02/04/12 1951  WBC 14.1*  HGB 8.1*  HCT 24.1*  PLT 179   BMET:  Basename 02/07/12 0506 02/06/12 0347  NA 141 140  K 3.9 4.4  CL 104 105  CO2 25 25  GLUCOSE 254* 74  BUN 87* 93*  CREATININE 2.22* 2.33*  CALCIUM 8.3* 8.4   No results found for this basename: PTH:2 in the last 72 hours Iron Studies: No results found for this basename: IRON,TIBC,TRANSFERRIN,FERRITIN in the last 72 hours  Studies/Results: Dg Chest Port 1 View  02/06/2012  *RADIOLOGY REPORT*  Clinical Data: PICC line placement  PORTABLE CHEST - 1 VIEW  Comparison: Portable exam 1137 hours compared to 02/05/2012  Findings: Tip of endotracheal tube 2.8 cm above carina. Nasogastric tube extends into abdomen. Left arm PICC line, tip projecting over SVC. Stable heart size and mediastinal contours. Severe diffuse bilateral airspace infiltrates, greater on the left. Probable small right pleural effusion. No pneumothorax.  IMPRESSION: Tip of left arm PICC line projects over SVC. Severe diffuse persistent bilateral airspace infiltrates.  Original Report Authenticated By: Burnetta Sabin,  M.D.   Portable Chest Xray  02/05/2012  *RADIOLOGY REPORT*  Clinical Data: Intubation.  PORTABLE CHEST - 1 VIEW 02/05/2012 1905 hours:  Comparison: Portable chest x-ray earlier same date 0701 hours.  Findings: Endotracheal tube tip approximately 2 cm above the carina.  Left subclavian central venous catheter tip in the left innominate vein, unchanged.  Interval marked worsening of now severe airspace consolidation throughout both lungs, left greater than right.  Mild gaseous distention of the visualized stomach.  IMPRESSION:  1.  Endotracheal tube tip approximately 2 cm above the carina. 2.  Marked worsening of diffuse pneumonia throughout both lungs, left greater than right, since earlier today. 3.  Mild gaseous distention of the stomach.  Original Report Authenticated By: Deniece Portela, M.D.    I have reviewed the patient's current medications.  Assessment/Plan: Problem #1 renal failure acute kidney injury his BUN is 87 creatinine is 2.2 progressively improving. Presently patient is none oliguric with good urine outputs. Problem #2 respiratory failure patient remain intubated Problem #3 aspiration pneumonia is on antibiotics a febrile white blood cell count still high. Problem #4 diabetes Problem #5 is a BPH Problem #6 history of anemia iron deficiency his last H&H was low Problem #7 history of mental retardation.  Plan: We'll continue his present management We'll check a CBC basic metabolic panel and phosphorus in the morning.   LOS: 17 days   [name] S 02/07/2012,7:31 AM

## 2012-02-07 NOTE — Progress Notes (Signed)
WUA: Sedation turned off to attempt weaning trial. Pt became hypertensive, tachypneaic, diaphoretic, and began banging his hand on the bed and shaking his head "no." Merilynn Finland RT notified, and resumed prior vent settings. Sedation resumed at previous rate. Pt now resting comfortably.

## 2012-02-07 NOTE — Clinical Social Work Placement (Signed)
Clinical Social Work Department CLINICAL SOCIAL WORK PLACEMENT NOTE 02/07/2012  Patient:  OSSIE, SEBO  Account Number:  [id number] Admit date:  01/21/2012  Clinical Social Worker:  Edwyna Shell, CLINICAL SOCIAL WORKER  Date/time:  01/31/2012 02:00 PM  Clinical Social Work is seeking post-discharge placement for this patient at the following level of care:   SKILLED NURSING   (*CSW will update this form in Epic as items are completed)   02/07/2012  Patient/family provided with [place] Department of Clinical Social Work's list of facilities offering this level of care within the geographic area requested by the patient (or if unable, by the patient's family).  02/07/2012  Patient/family informed of their freedom to choose among providers that offer the needed level of care, that participate in Medicare, Medicaid or managed care program needed by the patient, have an available bed and are willing to accept the patient.  02/07/2012  Patient/family informed of [facility]' ownership interest in [facility], as well as of the fact that they are under no obligation to receive care at this facility.  PASARR submitted to [employer] on 01/31/2012 PASARR number received from [employer] on 02/07/2012  FL2 transmitted to all facilities in geographic area requested by pt/family on  02/07/2012 FL2 transmitted to all facilities within larger geographic area on 02/07/2012  Patient informed that his/her managed care company has contracts with or will negotiate with  certain facilities, including the following:     Patient/family informed of bed offers received:   Patient chooses bed at  Physician recommends and patient chooses bed at    Patient to be transferred to  on   Patient to be transferred to facility by   The following physician request were entered in Epic:   Additional Comments: [place] submitted PASARR 01-31-12.  [place] DSS interviewed patient, nurses and CSW.   Lucius Conn states she will initiate petition for emergency guardianship tomorrow AM due to patient disability.  [name], [place]

## 2012-02-07 NOTE — Progress Notes (Signed)
NAMECANIO, CONTRERAZ           ACCOUNT NO.:  [id number]  MEDICAL RECORD NO.:  [id number]  LOCATION:                                 FACILITY:  PHYSICIAN:  Loni Beckwith, MD DATE OF BIRTH:  1961-01-02  DATE OF PROCEDURE:  02/07/2012 DATE OF DISCHARGE:                                PROGRESS NOTE   REASON FOR FOLLOWUP:  Uncontrolled type 1 diabetes.  SUBJECTIVE:  The patient remains on mechanical ventilation.  He is on NPH and NovoLog to achieve glycemic control.  Events have been reviewed. He did not have hypoglycemia over the last 24 hours.  Blood glucose ranged from 93-250.  OBJECTIVE:  GENERAL: The patient is sick looking, on mechanical ventilation  Not in acute distress.  Marland Kitchen VITAL SIGNS: Blood pressure 151/67, pulse rate 72, temperature 97.9.  He has a urine output of almost 4 L over the last 24 hours.  Respiratory rate is 25. CHEST: Diffuse rhonchi. CARDIOVASCULAR: Normal S1, S2.  No murmur.  No gallop. ABDOMEN: Soft and bowel sounds positive. EXTREMITIES: No edema.  Left heel in the cast.  His recent chemistry shows sodium 141, potassium 3.9, chloride 104, bicarb 25, BUN 87, creatinine 2.2.  Calcium 8.3.  CBG ranged from 90 to 250.  ASSESSMENT: 1. Type 1 diabetes, chronically uncontrolled. 2. Respiratory failure, failed extubation requiring mechanical     ventilation. 3. Acute on chronic kidney disease. 4. Anemia. 5. History of mental retardation.  PLAN:  We will continue NPH and NovoLog.  This time, he may tolerate a little better NPH dose of 8 units every 8 hours plus NovoLog sliding scale every 4 hours.  He is tolerating the Nepro at 40 mL/hr.  No major hypoglycemia.  If he is rebounding into 200s to 300s, he will need a glucose stabilizer for glycemic control.          ______________________________ Loni Beckwith, MD     GN/MEDQ  D:  02/07/2012  T:  02/07/2012  Job:  [id number]

## 2012-02-07 NOTE — Progress Notes (Signed)
Subjective: He seems about the same. We are weaning again today and he has excellent volumes. He was extubated about 48 hours ago and had to be reintubated I think because he has such extensive infiltrates on chest x-ray  Objective: Vital signs in last 24 hours: Temp:  [97.5 F (36.4 C)-98.4 F (36.9 C)] 98.4 F (36.9 C) (06/06 0400) Pulse Rate:  [73-103] 89  (06/06 0800) Resp:  [8-40] 8  (06/06 0800) BP: (137-171)/(57-87) 159/67 mmHg (06/06 0800) SpO2:  [87 %-100 %] 96 % (06/06 0800) FiO2 (%):  [39.9 %-50.5 %] 45 % (06/06 0800) Weight:  [86.1 kg (189 lb 13.1 oz)] 86.1 kg (189 lb 13.1 oz) (06/06 0500) Weight change: -8.5 kg (-18 lb 11.8 oz) Last BM Date: 02/06/12  Intake/Output from previous day: 06/05 0701 - 06/06 0700 In: 4023.5 [I.V.:3443.5; NG/GT:570; IV Piggyback:10] Out: 3975 [Urine:3975]  PHYSICAL EXAM General appearance: mild distress Resp: rhonchi bilaterally Cardio: regular rate and rhythm, S1, S2 normal, no murmur, click, rub or gallop GI: soft, non-tender; bowel sounds normal; no masses,  no organomegaly Extremities: extremities normal, atraumatic, no cyanosis or edema  Lab Results:    Basic Metabolic Panel:  Basename 02/07/12 0506 02/06/12 0347 02/05/12 0416  NA 141 140 --  K 3.9 4.4 --  CL 104 105 --  CO2 25 25 --  GLUCOSE 254* 74 --  BUN 87* 93* --  CREATININE 2.22* 2.33* --  CALCIUM 8.3* 8.4 --  MG -- -- --  PHOS -- -- 5.8*   Liver Function Tests: No results found for this basename: AST:2,ALT:2,ALKPHOS:2,BILITOT:2,PROT:2,ALBUMIN:2 in the last 72 hours No results found for this basename: LIPASE:2,AMYLASE:2 in the last 72 hours No results found for this basename: AMMONIA:2 in the last 72 hours CBC:  Basename 02/04/12 1951  WBC 14.1*  NEUTROABS 12.6*  HGB 8.1*  HCT 24.1*  MCV 81.4  PLT 179   Cardiac Enzymes: No results found for this basename: CKTOTAL:3,CKMB:3,CKMBINDEX:3,TROPONINI:3 in the last 72 hours BNP: No results found for this  basename: PROBNP:3 in the last 72 hours D-Dimer: No results found for this basename: DDIMER:2 in the last 72 hours CBG:  Basename 02/07/12 0801 02/07/12 0417 02/06/12 2329 02/06/12 1953 02/06/12 1633 02/06/12 1541  GLUCAP 161* 275* 154* 81 79 49*   Hemoglobin A1C: No results found for this basename: HGBA1C in the last 72 hours Fasting Lipid Panel: No results found for this basename: CHOL,HDL,LDLCALC,TRIG,CHOLHDL,LDLDIRECT in the last 72 hours Thyroid Function Tests:  Basename 02/04/12 1543  TSH 0.072*  T4TOTAL --  FREET4 0.81  T3FREE --  THYROIDAB --   Anemia Panel: No results found for this basename: VITAMINB12,FOLATE,FERRITIN,TIBC,IRON,RETICCTPCT in the last 72 hours Coagulation: No results found for this basename: LABPROT:2,INR:2 in the last 72 hours Urine Drug Screen: Drugs of Abuse  No results found for this basename: labopia, cocainscrnur, labbenz, amphetmu, thcu, labbarb    Alcohol Level: No results found for this basename: ETH:2 in the last 72 hours Urinalysis: No results found for this basename: COLORURINE:2,APPERANCEUR:2,LABSPEC:2,PHURINE:2,GLUCOSEU:2,HGBUR:2,BILIRUBINUR:2,KETONESUR:2,PROTEINUR:2,UROBILINOGEN:2,NITRITE:2,LEUKOCYTESUR:2 in the last 72 hours Misc. Labs:  ABGS  Basename 02/05/12 1900  PHART 7.291*  PO2ART 203.0*  TCO2 23.1  HCO3 24.2*   CULTURES Recent Results (from the past 240 hour(s))  CULTURE, BLOOD (ROUTINE X 2)     Status: Normal   Collection Time   01/30/12 10:24 AM      Component Value Range Status Comment   Specimen Description BLOOD RIGHT HAND   Final    Special Requests BOTTLES DRAWN AEROBIC AND  ANAEROBIC 6CC    Final    Culture NO GROWTH 5 DAYS   Final    Report Status 02/04/2012 FINAL   Final   CULTURE, BLOOD (ROUTINE X 2)     Status: Normal   Collection Time   01/30/12 10:44 AM      Component Value Range Status Comment   Specimen Description BLOOD LEFT HAND   Final    Special Requests BOTTLES DRAWN AEROBIC AND ANAEROBIC 5CC    Final    Culture NO GROWTH 5 DAYS   Final    Report Status 02/04/2012 FINAL   Final    Studies/Results: Dg Chest Port 1 View  02/06/2012  *RADIOLOGY REPORT*  Clinical Data: PICC line placement  PORTABLE CHEST - 1 VIEW  Comparison: Portable exam 1137 hours compared to 02/05/2012  Findings: Tip of endotracheal tube 2.8 cm above carina. Nasogastric tube extends into abdomen. Left arm PICC line, tip projecting over SVC. Stable heart size and mediastinal contours. Severe diffuse bilateral airspace infiltrates, greater on the left. Probable small right pleural effusion. No pneumothorax.  IMPRESSION: Tip of left arm PICC line projects over SVC. Severe diffuse persistent bilateral airspace infiltrates.  Original Report Authenticated By: Burnetta Sabin, M.D.   Portable Chest Xray  02/05/2012  *RADIOLOGY REPORT*  Clinical Data: Intubation.  PORTABLE CHEST - 1 VIEW 02/05/2012 1905 hours:  Comparison: Portable chest x-ray earlier same date 0701 hours.  Findings: Endotracheal tube tip approximately 2 cm above the carina.  Left subclavian central venous catheter tip in the left innominate vein, unchanged.  Interval marked worsening of now severe airspace consolidation throughout both lungs, left greater than right.  Mild gaseous distention of the visualized stomach.  IMPRESSION:  1.  Endotracheal tube tip approximately 2 cm above the carina. 2.  Marked worsening of diffuse pneumonia throughout both lungs, left greater than right, since earlier today. 3.  Mild gaseous distention of the stomach.  Original Report Authenticated By: Deniece Portela, M.D.    Medications:  Scheduled:   . albuterol  2.5 mg Nebulization Q4H   And  . ipratropium  0.5 mg Nebulization Q4H  . antiseptic oral rinse  15 mL Mouth Rinse QID  . chlorhexidine  15 mL Mouth Rinse BID  . dextrose      . enoxaparin (LOVENOX) injection  40 mg Subcutaneous Q24H  . famotidine  20 mg Oral Daily  . fluconazole  100 mg Oral Daily  . furosemide  40  mg Intravenous BID  . hydrocortisone sodium succinate  50 mg Intravenous Q6H  . insulin aspart  0-15 Units Subcutaneous Q4H  . insulin NPH  5 Units Subcutaneous Q8H  . levofloxacin (LEVAQUIN) IV  750 mg Intravenous Q48H  . midazolam  2-4 mg Intravenous Once  . pantoprazole sodium  40 mg Per Tube Q1200  . polyethylene glycol  17 g Oral q morning - 10a  . sodium chloride  10-40 mL Intracatheter Q12H  . sodium chloride  10-40 mL Intracatheter Q12H  . Tamsulosin HCl  0.4 mg Oral Daily  . traZODone  50 mg Oral QHS  . vancomycin  750 mg Intravenous Q48H  . ziprasidone  40 mg Oral BID WC  . DISCONTD: insulin NPH  10 Units Subcutaneous Q8H  . DISCONTD: insulin NPH  15 Units Subcutaneous Q8H  . DISCONTD: pantoprazole (PROTONIX) IV  40 mg Intravenous Daily   Continuous:   . 0.45 % NaCl with KCl 20 mEq / L 100 mL/hr at 02/07/12 0500  .  dextrose 5 % and 0.45% NaCl 50 mL/hr at 02/07/12 0500  . feeding supplement (NEPRO CARB STEADY) 1,000 mL (02/05/12 1104)  . fentaNYL infusion INTRAVENOUS 100 mcg/hr (02/07/12 0817)  . midazolam (VERSED) infusion 5 mg/hr (02/07/12 0817)   [id number], albuterol, ALPRAZolam, alum & mag hydroxide-simeth, dextrose, dextrose, dextrose, fentaNYL, fentaNYL, hyoscyamine, midazolam, midazolam, midazolam, morphine injection, ondansetron, sodium chloride, sodium chloride, DISCONTD: DOBUTamine, DISCONTD: norepinephrine (LEVOPHED) Adult infusion, DISCONTD: sodium chloride  Assesment: He has respiratory failure. He has bilateral pneumonia. He has acute on chronic renal failure. He has had problems with his blood sugar control but overall he is better. There may be some opportunity for weaning and potential extubation but he may be transferred to a long-term acute care hospital and if that's going to happen I will probably hold off Active Problems:  * No active hospital problems. *     Plan: Discussion with Dr. Legrand Rams about transfer and then decide what to do    LOS:  17 days   Jolea Dolle L 02/07/2012, 8:20 AM

## 2012-02-07 NOTE — Clinical Social Work Note (Addendum)
MD was planning to transfer to [facility], but facility does not accept Medicaid only.  [name] at [facility] requested progress notes from yesterday and today and CSW provided.  CSW spoke with Dr. Luan Pulling to update on situation.  Pt failed weaning today.  CSW will begin search for vent SNF bed as possible trach being discussed. 90 day Pasarr received.   [name], [place]

## 2012-02-07 NOTE — Progress Notes (Signed)
Patient ID: [name], male   DOB: 1961/06/01, 51 y.o.   MRN: [id number]

## 2012-02-08 ENCOUNTER — Inpatient Hospital Stay (HOSPITAL_COMMUNITY): Payer: Medicaid Other

## 2012-02-08 DIAGNOSIS — E1165 Type 2 diabetes mellitus with hyperglycemia: Secondary | ICD-10-CM | POA: Diagnosis not present

## 2012-02-08 DIAGNOSIS — J9690 Respiratory failure, unspecified, unspecified whether with hypoxia or hypercapnia: Secondary | ICD-10-CM | POA: Diagnosis not present

## 2012-02-08 LAB — GLUCOSE, CAPILLARY
Glucose-Capillary: 119 mg/dL — ABNORMAL HIGH (ref 70–99)
Glucose-Capillary: 204 mg/dL — ABNORMAL HIGH (ref 70–99)
Glucose-Capillary: 230 mg/dL — ABNORMAL HIGH (ref 70–99)
Glucose-Capillary: 201 mg/dL — ABNORMAL HIGH (ref 70–99)

## 2012-02-08 LAB — BLOOD GAS, ARTERIAL
Acid-Base Excess: 1.3 mmol/L (ref 0.0–2.0)
Drawn by: 213101
FIO2: 50 %
MECHVT: 530 mL
O2 Saturation: 97.2 %
PEEP: 5 cmH2O
Patient temperature: 37
RATE: 16 resp/min

## 2012-02-08 LAB — CBC
HCT: 26.1 % — ABNORMAL LOW (ref 39.0–52.0)
Hemoglobin: 8.5 g/dL — ABNORMAL LOW (ref 13.0–17.0)
MCHC: 32.6 g/dL (ref 30.0–36.0)
RBC: 3.15 MIL/uL — ABNORMAL LOW (ref 4.22–5.81)
WBC: 18.5 10*3/uL — ABNORMAL HIGH (ref 4.0–10.5)

## 2012-02-08 LAB — BASIC METABOLIC PANEL
BUN: 91 mg/dL — ABNORMAL HIGH (ref 6–23)
Chloride: 103 mEq/L (ref 96–112)
GFR calc Af Amer: 41 mL/min — ABNORMAL LOW (ref >90)
GFR calc non Af Amer: 35 mL/min — ABNORMAL LOW (ref >90)
Potassium: 3.2 mEq/L — ABNORMAL LOW (ref 3.5–5.1)
Sodium: 142 mEq/L (ref 135–145)

## 2012-02-08 LAB — PHOSPHORUS: Phosphorus: 5.8 mg/dL — ABNORMAL HIGH (ref 2.3–4.6)

## 2012-02-08 MED ORDER — LORAZEPAM 2 MG/ML IJ SOLN
INTRAMUSCULAR | Status: AC
  Filled 2012-02-08: qty 1

## 2012-02-08 MED ORDER — LORAZEPAM 2 MG/ML IJ SOLN
1.0000 mg | INTRAMUSCULAR | Status: DC | PRN
  Administered 2012-02-08 – 2012-02-20 (×24): 1 mg via INTRAVENOUS
  Filled 2012-02-08 (×24): qty 1

## 2012-02-08 MED ORDER — KCL IN DEXTROSE-NACL 40-5-0.45 MEQ/L-%-% IV SOLN
INTRAVENOUS | Status: DC
  Administered 2012-02-08: 19:00:00 via INTRAVENOUS
  Administered 2012-02-09: 1000 mL via INTRAVENOUS

## 2012-02-08 MED ORDER — MIDAZOLAM HCL 50 MG/10ML IJ SOLN
INTRAMUSCULAR | Status: AC
  Filled 2012-02-08: qty 1

## 2012-02-08 MED ORDER — FUROSEMIDE 10 MG/ML IJ SOLN
60.0000 mg | Freq: Two times a day (BID) | INTRAMUSCULAR | Status: DC
  Administered 2012-02-08 – 2012-02-11 (×7): 60 mg via INTRAVENOUS
  Filled 2012-02-08 (×7): qty 6

## 2012-02-08 NOTE — Progress Notes (Signed)
Patient ID: [name], male   DOB: October 27, 1960, 51 y.o.   MRN: [id number]

## 2012-02-08 NOTE — Progress Notes (Signed)
NAME:  [name], [name]           ACCOUNT NO.:  [id number]  MEDICAL RECORD NO.:  [id number]  LOCATION:  IC03                          FACILITY:  [facility]  PHYSICIAN:  Alani Lacivita D. Legrand Rams, MD   DATE OF BIRTH:  15-Apr-1961  DATE OF PROCEDURE:  02/08/2012 DATE OF DISCHARGE:                                PROGRESS NOTE   SUBJECTIVE:  The patient is still on ventilatory support.  He is unable to communicate verbally.  OBJECTIVE:  GENERAL:  The patient is just opening his eyes.  He is orally intubated.  He has also NG-tube. VITAL SIGNS:  Blood pressure 142/55, pulse 66, respiratory rate 23, temperature 97.9 degrees Fahrenheit. CHEST:  Poor air entry, bilateral rhonchi. CARDIOVASCULAR SYSTEM:  First and second heart sound heard.  No murmur. No gallop. ABDOMEN:  Soft and lax.  Bowel sound is positive.  No mass or organomegaly.  EXTREMITIES:  No leg edema.  LABS:  ABG, pH 7.299,  pCO2 56, pO2 105, bicarbonate 27, saturation 97.2.  BMP:  Sodium 142, potassium 3.2, chloride 103, carbon dioxide 27, glucose 109, BUN 21, creatinine 2.08, calcium 8.6.  CBC:  WBC 18.5, hemoglobin 8.5, hematocrit 26.1, and platelets 182.  ASSESSMENT: 1. Ventilatory dependent respiratory failure. 2. History of septic shock. 3. Bilateral pneumonia. 4. Acute on chronic renal failure. 5. Diabetes mellitus. 6. Mental retardation.  PLAN:  We will continue ventilatory support.  I have talked to Dr. Sinda Du, his pulmonologist and we are planning to do a surgical consult for possible tracheostomy placement.  The patient is not currently accepted in long-term acute hospital due to his insurance.  We will continue nutritional support and other medications.     [name], MD     TDF/MEDQ  D:  02/08/2012  T:  02/08/2012  Job:  [id number]

## 2012-02-08 NOTE — Progress Notes (Signed)
Subjective: Interval History: none.  Objective: Vital signs in last 24 hours: Temp:  [95.1 F (35.1 C)-97.9 F (36.6 C)] 97.4 F (36.3 C) (06/07 0600) Pulse Rate:  [66-93] 68  (06/07 0600) Resp:  [5-23] 5  (06/07 0600) BP: (142-192)/(55-90) 160/67 mmHg (06/07 0600) SpO2:  [93 %-100 %] 100 % (06/07 0600) FiO2 (%):  [29.8 %-50.4 %] 49.9 % (06/07 0600) Weight:  [85.3 kg (188 lb 0.8 oz)] 85.3 kg (188 lb 0.8 oz) (06/07 0500) Weight change: -0.8 kg (-1 lb 12.2 oz)  Intake/Output from previous day: 06/06 0701 - 06/07 0700 In: 4447.3 [I.V.:3573.3; NG/GT:560; IV Piggyback:314] Out: [place] Patient her alert and intubated Chest decreased breath sound bilaterally and some expiratory wheezing Heart exam regular rate and rhythm3 Abdomen positive bowel sound Extremities trace edema.  Lab Results:  [facility] 02/08/12 0437  WBC 18.5*  HGB 8.5*  HCT 26.1*  PLT 182   BMET:  Basename 02/08/12 0437 02/07/12 0506  NA 142 141  K 3.2* 3.9  CL 103 104  CO2 27 25  GLUCOSE 109* 254*  BUN 91* 87*  CREATININE 2.08* 2.22*  CALCIUM 8.6 8.3*   No results found for this basename: PTH:2 in the last 72 hours Iron Studies: No results found for this basename: IRON,TIBC,TRANSFERRIN,FERRITIN in the last 72 hours  Studies/Results: Dg Chest 1v Repeat Same Day  02/07/2012  *RADIOLOGY REPORT*  Clinical Data: Repositioning of endotracheal tube  CHEST - 1 VIEW SAME DAY  Comparison: Portable exam 1640 hours compared to 1137 hours  Findings: Patient motion artifacts. Tip of endotracheal tube 3.8 cm above carina. Diffuse bilateral pulmonary infiltrates left greater than right, less well visualized than on previous study. Nasogastric tube extends into stomach. Bones appear demineralized. Left arm PICC line stable.  IMPRESSION: Satisfactory endotracheal tube position. Limited exam showing persistent bilateral pulmonary infiltrates greater on left.  Original Report Authenticated By: Burnetta Sabin,  M.D.   Dg Chest Port 1 View  02/08/2012  *RADIOLOGY REPORT*  Clinical Data: Respiratory failure.  PORTABLE CHEST - 1 VIEW  Comparison: 02/07/2012.  Findings: The endotracheal tube, NG tube and left PICC lines are stable.  There is a persistent asymmetric interstitial and airspace process but overall this shows improvement, particularly in the right lung.  No pleural effusion or pneumothorax.  IMPRESSION:  1.  Stable support apparatus. 2.  Diffuse asymmetric interstitial and airspace process with slight improvement since prior study.  Original Report Authenticated By: P. Kalman Jewels, M.D.   Dg Chest Port 1 View  02/06/2012  *RADIOLOGY REPORT*  Clinical Data: PICC line placement  PORTABLE CHEST - 1 VIEW  Comparison: Portable exam 1137 hours compared to 02/05/2012  Findings: Tip of endotracheal tube 2.8 cm above carina. Nasogastric tube extends into abdomen. Left arm PICC line, tip projecting over SVC. Stable heart size and mediastinal contours. Severe diffuse bilateral airspace infiltrates, greater on the left. Probable small right pleural effusion. No pneumothorax.  IMPRESSION: Tip of left arm PICC line projects over SVC. Severe diffuse persistent bilateral airspace infiltrates.  Original Report Authenticated By: Burnetta Sabin, M.D.    I have reviewed the patient'Kennedy current medications.  Assessment/Plan: Problem #1 renal failure acute kidney injury his BUN and creatinine seems to be improving. Problem #2 hypokalemia patient on potassium supplement potassium however is declining Problem #3 respiratory failure patient remains intubated Problem #4 history of anemia iron deficiency H&H is low  Problem #5 history of BPH Problem #6 history of aspiration pneumonia Problem #7 history  of diabetes. Plan: We'll change his IV fluid to half-normal saline with KCl 40 mEq at 75 cc per hour. We'll check his basic metabolic panel in the morning.   LOS: 18 days   [name] 02/08/2012,7:37 AM

## 2012-02-08 NOTE — Progress Notes (Signed)
Subjective: He is overall about the same. He remains intubated on the ventilator. No new problems have been noted.  Objective: Vital signs in last 24 hours: Temp:  [95.1 F (35.1 C)-97.9 F (36.6 C)] 97.4 F (36.3 C) (06/07 0600) Pulse Rate:  [66-93] 68  (06/07 0600) Resp:  [5-23] 5  (06/07 0600) BP: (142-192)/(55-90) 160/67 mmHg (06/07 0600) SpO2:  [93 %-100 %] 100 % (06/07 0600) FiO2 (%):  [29.8 %-50.4 %] 49.9 % (06/07 0600) Weight:  [85.3 kg (188 lb 0.8 oz)] 85.3 kg (188 lb 0.8 oz) (06/07 0500) Weight change: -0.8 kg (-1 lb 12.2 oz) Last BM Date: 02/07/12  Intake/Output from previous day: 06/06 0701 - 06/07 0700 In: 4447.3 [I.V.:3573.3; NG/GT:560; IV Piggyback:314] Out: 7000 [Urine:7000]  PHYSICAL EXAM General appearance: Intubated and sedated Resp: rhonchi bilaterally Cardio: regular rate and rhythm, S1, S2 normal, no murmur, click, rub or gallop GI: soft, non-tender; bowel sounds normal; no masses,  no organomegaly Extremities: extremities normal, atraumatic, no cyanosis or edema  Lab Results:    Basic Metabolic Panel:  Basename 02/08/12 0437 02/07/12 0506  NA 142 141  K 3.2* 3.9  CL 103 104  CO2 27 25  GLUCOSE 109* 254*  BUN 91* 87*  CREATININE 2.08* 2.22*  CALCIUM 8.6 8.3*  MG -- --  PHOS 5.8* --   Liver Function Tests: No results found for this basename: AST:2,ALT:2,ALKPHOS:2,BILITOT:2,PROT:2,ALBUMIN:2 in the last 72 hours No results found for this basename: LIPASE:2,AMYLASE:2 in the last 72 hours No results found for this basename: AMMONIA:2 in the last 72 hours CBC:  Basename 02/08/12 0437  WBC 18.5*  NEUTROABS --  HGB 8.5*  HCT 26.1*  MCV 82.9  PLT 182   Cardiac Enzymes: No results found for this basename: CKTOTAL:3,CKMB:3,CKMBINDEX:3,TROPONINI:3 in the last 72 hours BNP: No results found for this basename: PROBNP:3 in the last 72 hours D-Dimer: No results found for this basename: DDIMER:2 in the last 72 hours CBG:  Basename 02/08/12  0400 02/08/12 02/07/12 1958 02/07/12 1644 02/07/12 1209 02/07/12 0801  GLUCAP 106* 166* 227* 189* 93 161*   Hemoglobin A1C: No results found for this basename: HGBA1C in the last 72 hours Fasting Lipid Panel: No results found for this basename: CHOL,HDL,LDLCALC,TRIG,CHOLHDL,LDLDIRECT in the last 72 hours Thyroid Function Tests: No results found for this basename: TSH,T4TOTAL,FREET4,T3FREE,THYROIDAB in the last 72 hours Anemia Panel: No results found for this basename: VITAMINB12,FOLATE,FERRITIN,TIBC,IRON,RETICCTPCT in the last 72 hours Coagulation: No results found for this basename: LABPROT:2,INR:2 in the last 72 hours Urine Drug Screen: Drugs of Abuse  No results found for this basename: labopia, cocainscrnur, labbenz, amphetmu, thcu, labbarb    Alcohol Level: No results found for this basename: ETH:2 in the last 72 hours Urinalysis: No results found for this basename: COLORURINE:2,APPERANCEUR:2,LABSPEC:2,PHURINE:2,GLUCOSEU:2,HGBUR:2,BILIRUBINUR:2,KETONESUR:2,PROTEINUR:2,UROBILINOGEN:2,NITRITE:2,LEUKOCYTESUR:2 in the last 72 hours Misc. Labs:  ABGS  Basename 02/08/12 0515  PHART 7.299*  PO2ART 105.0*  TCO2 25.6  HCO3 27.1*   CULTURES Recent Results (from the past 240 hour(s))  CULTURE, BLOOD (ROUTINE X 2)     Status: Normal   Collection Time   01/30/12 10:24 AM      Component Value Range Status Comment   Specimen Description BLOOD RIGHT HAND   Final    Special Requests BOTTLES DRAWN AEROBIC AND ANAEROBIC 6CC    Final    Culture NO GROWTH 5 DAYS   Final    Report Status 02/04/2012 FINAL   Final   CULTURE, BLOOD (ROUTINE X 2)     Status: Normal  Collection Time   01/30/12 10:44 AM      Component Value Range Status Comment   Specimen Description BLOOD LEFT HAND   Final    Special Requests BOTTLES DRAWN AEROBIC AND ANAEROBIC 5CC   Final    Culture NO GROWTH 5 DAYS   Final    Report Status 02/04/2012 FINAL   Final    Studies/Results: Dg Chest 1v Repeat Same  Day  02/07/2012  *RADIOLOGY REPORT*  Clinical Data: Repositioning of endotracheal tube  CHEST - 1 VIEW SAME DAY  Comparison: Portable exam 1640 hours compared to 1137 hours  Findings: Patient motion artifacts. Tip of endotracheal tube 3.8 cm above carina. Diffuse bilateral pulmonary infiltrates left greater than right, less well visualized than on previous study. Nasogastric tube extends into stomach. Bones appear demineralized. Left arm PICC line stable.  IMPRESSION: Satisfactory endotracheal tube position. Limited exam showing persistent bilateral pulmonary infiltrates greater on left.  Original Report Authenticated By: Burnetta Sabin, M.D.   Dg Chest Port 1 View  02/08/2012  *RADIOLOGY REPORT*  Clinical Data: Respiratory failure.  PORTABLE CHEST - 1 VIEW  Comparison: 02/07/2012.  Findings: The endotracheal tube, NG tube and left PICC lines are stable.  There is a persistent asymmetric interstitial and airspace process but overall this shows improvement, particularly in the right lung.  No pleural effusion or pneumothorax.  IMPRESSION:  1.  Stable support apparatus. 2.  Diffuse asymmetric interstitial and airspace process with slight improvement since prior study.  Original Report Authenticated By: P. Kalman Jewels, M.D.   Dg Chest Port 1 View  02/06/2012  *RADIOLOGY REPORT*  Clinical Data: PICC line placement  PORTABLE CHEST - 1 VIEW  Comparison: Portable exam 1137 hours compared to 02/05/2012  Findings: Tip of endotracheal tube 2.8 cm above carina. Nasogastric tube extends into abdomen. Left arm PICC line, tip projecting over SVC. Stable heart size and mediastinal contours. Severe diffuse bilateral airspace infiltrates, greater on the left. Probable small right pleural effusion. No pneumothorax.  IMPRESSION: Tip of left arm PICC line projects over SVC. Severe diffuse persistent bilateral airspace infiltrates.  Original Report Authenticated By: Burnetta Sabin, M.D.    Medications:  Scheduled:   .  albuterol  2.5 mg Nebulization Q4H   And  . ipratropium  0.5 mg Nebulization Q4H  . antiseptic oral rinse  15 mL Mouth Rinse QID  . chlorhexidine  15 mL Mouth Rinse BID  . enoxaparin (LOVENOX) injection  40 mg Subcutaneous Q24H  . famotidine  20 mg Oral Daily  . fluconazole  100 mg Oral Daily  . furosemide  40 mg Intravenous BID  . hydrocortisone sodium succinate  50 mg Intravenous Q6H  . insulin aspart  0-15 Units Subcutaneous Q4H  . insulin NPH  8 Units Subcutaneous Q8H  . levofloxacin (LEVAQUIN) IV  750 mg Intravenous Q48H  . midazolam  2-4 mg Intravenous Once  . pantoprazole sodium  40 mg Per Tube Q1200  . polyethylene glycol  17 g Oral q morning - 10a  . sodium chloride  10-40 mL Intracatheter Q12H  . sodium chloride  10-40 mL Intracatheter Q12H  . Tamsulosin HCl  0.4 mg Oral Daily  . traZODone  50 mg Oral QHS  . vancomycin  750 mg Intravenous Q48H  . ziprasidone  40 mg Oral BID WC  . DISCONTD: insulin NPH  5 Units Subcutaneous Q8H   Continuous:   . dextrose 5 % and 0.45 % NaCl with KCl 40 mEq/L    .  dextrose 5 % and 0.45% NaCl 50 mL/hr at 02/08/12 0500  . feeding supplement (NEPRO CARB STEADY) 1,000 mL (02/05/12 1104)  . fentaNYL infusion INTRAVENOUS 125 mcg/hr (02/08/12 0500)  . midazolam (VERSED) infusion 5 mg/hr (02/08/12 0500)  . DISCONTD: 0.45 % NaCl with KCl 20 mEq / L 100 mL/hr at 02/08/12 0500   [id number], albuterol, ALPRAZolam, alum & mag hydroxide-simeth, dextrose, dextrose, fentaNYL, fentaNYL, hyoscyamine, midazolam, midazolam, midazolam, morphine injection, ondansetron, sodium chloride, sodium chloride  Assesment: He remains in respiratory failure. His chest x-ray to me looks like he may have more volume overload. He is not eligible to go to an LTAC because of insurance problems. I discussed his situation with Dr. Legrand Rams. We are going to see if we can diurese him make every effort to get him off the ventilator but go ahead now and get surgical consultation  for potential PEG tube and tracheostomy. I discussed his situation with Dr. Lowanda Foster regarding diuresis and that will be ordered. Active Problems:  * No active hospital problems. *     Plan: As above    LOS: 18 days   Kimberli Winne L 02/08/2012, 7:42 AM

## 2012-02-08 NOTE — Progress Notes (Signed)
Nutrition Follow-up  Pt unable to wean at this time. Discussion of  Possible tracheostomy and PEG tube placement.  Diet Order: NPO with continuous Nepro @ 40 ml/hr is delivering 1728 kcal, 78 gr protein daily which will meet 89% est energy, 74% protein needs.  Since pt is unable to wean at this time recommend providing additional protein to more adequately meet increased needs.   Meds: Scheduled Meds:   . albuterol  2.5 mg Nebulization Q4H   And  . ipratropium  0.5 mg Nebulization Q4H  . antiseptic oral rinse  15 mL Mouth Rinse QID  . chlorhexidine  15 mL Mouth Rinse BID  . enoxaparin (LOVENOX) injection  40 mg Subcutaneous Q24H  . famotidine  20 mg Oral Daily  . furosemide  60 mg Intravenous BID  . hydrocortisone sodium succinate  50 mg Intravenous Q6H  . insulin aspart  0-15 Units Subcutaneous Q4H  . insulin NPH  8 Units Subcutaneous Q8H  . levofloxacin (LEVAQUIN) IV  750 mg Intravenous Q48H  . LORazepam      . pantoprazole sodium  40 mg Per Tube Q1200  . polyethylene glycol  17 g Oral q morning - 10a  . sodium chloride  10-40 mL Intracatheter Q12H  . sodium chloride  10-40 mL Intracatheter Q12H  . Tamsulosin HCl  0.4 mg Oral Daily  . traZODone  50 mg Oral QHS  . vancomycin  750 mg Intravenous Q48H  . ziprasidone  40 mg Oral BID WC  . DISCONTD: fluconazole  100 mg Oral Daily  . DISCONTD: furosemide  40 mg Intravenous BID  . DISCONTD: midazolam  2-4 mg Intravenous Once   Continuous Infusions:   . dextrose 5 % and 0.45 % NaCl with KCl 40 mEq/L    . dextrose 5 % and 0.45% NaCl 75 mL/hr at 02/08/12 1500  . feeding supplement (NEPRO CARB STEADY) 1,000 mL (02/05/12 1104)  . fentaNYL infusion INTRAVENOUS 50 mcg/hr (02/08/12 1350)  . midazolam (VERSED) infusion 2 mg/hr (02/08/12 1537)  . DISCONTD: 0.45 % NaCl with KCl 20 mEq / L Stopped (02/08/12 0806)   PRN Meds:.acetaminophen, albuterol, ALPRAZolam, alum & mag hydroxide-simeth, dextrose, dextrose, fentaNYL, hyoscyamine,  LORazepam, midazolam, morphine injection, ondansetron, sodium chloride, sodium chloride, DISCONTD: fentaNYL, DISCONTD: midazolam, DISCONTD: midazolam  Labs:  CMP     Component Value Date/Time   NA 142 02/08/2012 0437   K 3.2* 02/08/2012 0437   CL 103 02/08/2012 0437   CO2 27 02/08/2012 0437   GLUCOSE 109* 02/08/2012 0437   BUN 91* 02/08/2012 0437   CREATININE 2.08* 02/08/2012 0437   CALCIUM 8.6 02/08/2012 0437   PROT 5.8* 02/02/2012 0458   ALBUMIN 1.8* 02/02/2012 0458   AST 26 02/02/2012 0458   ALT 22 02/02/2012 0458   ALKPHOS 149* 02/02/2012 0458   BILITOT 0.3 02/02/2012 0458   GFRNONAA 35* 02/08/2012 0437   GFRAA 41* 02/08/2012 0437     Intake/Output Summary (Last 24 hours) at 02/08/12 1619 Last data filed at 02/08/12 1500  Gross per 24 hour  Intake 4051.5 ml  Output   6700 ml  Net -2648.5 ml    Weight Status: 188# (85kg) reflects decr of 6#,3% x3d desired given pt edema  Re-estimated needs:  1800-2000 kcal per day 115-132 gr protein daily  500 ml plus output  Nutrition Dx:  Inadequate oral intake r/t  inability to eat AEB mechanical ventilation  Goal: Pt will cont to tol TF and meet >80% of est energy and protein needs.  Intervention:    Rec if MD agrees:- add ProStat 30 ml BID (200 kcal, 30 gr protein) daily via OGT  Nutrition support with protein modular would provide 1928 kcal, 108 gr protein daily  Monitor: TF tol, labs and wts   Frederik Schmidt Pager [phone]

## 2012-02-08 NOTE — Consult Note (Signed)
ANTIBIOTIC CONSULT NOTE   Pharmacy Consult for Vancomycin and Levaquin Indication: PNA  No Known Allergies  Patient Measurements: Height: 5\' 10"  (177.8 cm) Weight: 188 lb 0.8 oz (85.3 kg) IBW/kg (Calculated) : 73   Vital Signs: Temp: 97.5 F (36.4 C) (06/07 0800) Temp src: Oral (06/07 0800) BP: 151/58 mmHg (06/07 0900) Pulse Rate: 75  (06/07 0900) Intake/Output from previous day: 06/06 0701 - 06/07 0700 In: 4447.3 [I.V.:3573.3; NG/GT:560; IV Piggyback:314] Out: 7000 [Urine:7000] Intake/Output from this shift: Total I/O In: 219.2 [I.V.:219.2] Out: 1000 [Urine:1000]  Labs:  Basename 02/08/12 0437 02/07/12 0506 02/06/12 0347  WBC 18.5* -- --  HGB 8.5* -- --  PLT 182 -- --  LABCREA -- -- --  CREATININE 2.08* 2.22* 2.33*   Estimated Creatinine Clearance: 43.9 ml/min (by C-G formula based on Cr of 2.08).  Basename 02/07/12 0506  VANCOTROUGH --  Corlis Leak --  VANCORANDOM 14.7  GENTTROUGH --  GENTPEAK --  GENTRANDOM --  TOBRATROUGH --  [place] --  AMIKACIN --    Microbiology: Recent Results (from the past 720 hour(s))  URINE CULTURE     Status: Normal   Collection Time   01/11/12 10:49 PM      Component Value Range Status Comment   Specimen Description URINE, CATHETERIZED   Final    Special Requests NONE   Final    Culture  Setup Time [id number]   Final    Colony Count >=100,000 COLONIES/ML   Final    Culture     Final    Value: Multiple bacterial morphotypes present, none predominant. Suggest appropriate recollection if clinically indicated.   Report Status 01/13/2012 FINAL   Final   URINE CULTURE     Status: Normal   Collection Time   01/21/12  3:36 PM      Component Value Range Status Comment   Specimen Description URINE, CATHETERIZED   Final    Special Requests NONE   Final    Culture  Setup Time [id number]   Final    Colony Count >=100,000 COLONIES/ML   Final    Culture     Final    Value:  ENTEROCOCCUS SPECIES     YEAST   Report Status 01/25/2012 FINAL   Final    Organism ID, Bacteria ENTEROCOCCUS SPECIES   Final   MRSA PCR SCREENING     Status: Abnormal   Collection Time   01/22/12  9:35 AM      Component Value Range Status Comment   MRSA by PCR INVALID RESULTS, SPECIMEN SENT FOR CULTURE (*) NEGATIVE  Final   MRSA CULTURE     Status: Normal   Collection Time   01/22/12  9:35 AM      Component Value Range Status Comment   Specimen Description NOSE NASAL SWAB   Final    Special Requests NONE   Final    Culture     Final    Value: NO STAPHYLOCOCCUS AUREUS ISOLATED     Note: NOMRSA   Report Status 01/24/2012 FINAL   Final   CULTURE, BLOOD (ROUTINE X 2)     Status: Normal   Collection Time   01/24/12  4:59 PM      Component Value Range Status Comment   Specimen Description BLOOD RIGHT HAND   Final    Special Requests BOTTLES DRAWN AEROBIC AND ANAEROBIC 8CC   Final    Culture NO GROWTH 5 DAYS   Final  Report Status 01/29/2012 FINAL   Final   CULTURE, BLOOD (ROUTINE X 2)     Status: Normal   Collection Time   01/24/12  5:03 PM      Component Value Range Status Comment   Specimen Description BLOOD LEFT HAND   Final    Special Requests BOTTLES DRAWN AEROBIC AND ANAEROBIC 8CC   Final    Culture NO GROWTH 5 DAYS   Final    Report Status 01/29/2012 FINAL   Final   CLOSTRIDIUM DIFFICILE BY PCR     Status: Normal   Collection Time   01/27/12  9:00 PM      Component Value Range Status Comment   C difficile by pcr NEGATIVE  NEGATIVE  Final   CULTURE, BLOOD (ROUTINE X 2)     Status: Normal   Collection Time   01/30/12 10:24 AM      Component Value Range Status Comment   Specimen Description BLOOD RIGHT HAND   Final    Special Requests BOTTLES DRAWN AEROBIC AND ANAEROBIC 6CC    Final    Culture NO GROWTH 5 DAYS   Final    Report Status 02/04/2012 FINAL   Final   CULTURE, BLOOD (ROUTINE X 2)     Status: Normal   Collection Time   01/30/12 10:44 AM      Component Value Range  Status Comment   Specimen Description BLOOD LEFT HAND   Final    Special Requests BOTTLES DRAWN AEROBIC AND ANAEROBIC 5CC   Final    Culture NO GROWTH 5 DAYS   Final    Report Status 02/04/2012 FINAL   Final    Medical History: Past Medical History  Diagnosis Date  . Renal disorder   . Elective mutism   . Autism   . Hyperosmolar (nonketotic) coma   . Hyperglycemia   . Diabetes mellitus   . Hyponatremia   . Mental retardation   . Urinary retention   . BPH (benign prostatic hyperplasia)   . Gastroparesis   . Depression   . Pneumonia   . UTI (urinary tract infection)   . Mental retardation    Medications:  Scheduled:     . albuterol  2.5 mg Nebulization Q4H   And  . ipratropium  0.5 mg Nebulization Q4H  . antiseptic oral rinse  15 mL Mouth Rinse QID  . chlorhexidine  15 mL Mouth Rinse BID  . enoxaparin (LOVENOX) injection  40 mg Subcutaneous Q24H  . famotidine  20 mg Oral Daily  . furosemide  60 mg Intravenous BID  . hydrocortisone sodium succinate  50 mg Intravenous Q6H  . insulin aspart  0-15 Units Subcutaneous Q4H  . insulin NPH  8 Units Subcutaneous Q8H  . levofloxacin (LEVAQUIN) IV  750 mg Intravenous Q48H  . midazolam  2-4 mg Intravenous Once  . pantoprazole sodium  40 mg Per Tube Q1200  . polyethylene glycol  17 g Oral q morning - 10a  . sodium chloride  10-40 mL Intracatheter Q12H  . sodium chloride  10-40 mL Intracatheter Q12H  . Tamsulosin HCl  0.4 mg Oral Daily  . traZODone  50 mg Oral QHS  . vancomycin  750 mg Intravenous Q48H  . ziprasidone  40 mg Oral BID WC  . DISCONTD: fluconazole  100 mg Oral Daily  . DISCONTD: furosemide  40 mg Intravenous BID  . DISCONTD: insulin NPH  5 Units Subcutaneous Q8H   Assessment:   Vancomycin random level <20, renal  function changing. SCr continue to improve Diflucan stopped per pharmacy recommendation  Goal of Therapy:  Vancomycin trough level 15-20 Eradicate infection.  Plan: 1) continue Vancomycin 750mg  iv  q48hrs for now 2) Monitor SCr daily for now 3) Vancomycin levels weekly unless status changes 4) Continue Levaquin 750mg  IV Q48hrs  5) labs per protocol  Hart Robinsons A 02/08/2012,10:42 AM

## 2012-02-08 NOTE — Progress Notes (Signed)
[name], [name]           ACCOUNT NO.:  [id number]  MEDICAL RECORD NO.:  [id number]  LOCATION:  [facility]03                          FACILITY:  [facility]  PHYSICIAN:  Loni Beckwith, MD DATE OF BIRTH:  1961-05-22  DATE OF PROCEDURE:  02/08/2012 DATE OF DISCHARGE:                                PROGRESS NOTE   REASON FOR FOLLOWUP:  Uncontrolled type 1 diabetes.  SUBJECTIVE:  The patient remains on mechanical ventilation.  He is tolerating the tube feeding at 40 mL per hour at this point.  His glycemia is currently ranging from 100-220.  No hypoglycemia.  OBJECTIVE:  GENERAL:  Not in acute distress.  He responds to stimuli by opening his eyes and moving his upper extremities. VITAL SIGNS:  Blood pressure is 155/130, pulse rate is 98, respiratory rate is 17, temperature is 97.8. CHEST:  Diffuse rhonchi. CARDIOVASCULAR:  Normal S1 and S2.  No murmur.  No gallop. ABDOMEN:  Positive bowel sounds. EXTREMITIES:  Left lower extremity in cast.  LABORATORY DATA:  His most recent labs show sodium 142, potassium 3.2, chloride 103, bicarb 27, BUN 91, creatinine 2.08.  ASSESSMENT: 1. Type 1 diabetes, chronically uncontrolled.  Recent A1c was 8%. 2. Respiratory failure, on mechanical ventilation, failed extubation     attempts. 3. Acute-on-chronic kidney disease. 4. Anemia. 5. History of mental retardation.  PLAN:  Given his continuous tube feeding, he will continue to benefit from NPH for basal insulin, currently at 8 units every 8 hours, we will continue the same.  Every 4 hours, he will need fingerstick blood glucose determination and NovoLog coverage or sliding scale.  He is tolerating his tube feeding at 40 mL per hour.  Perhaps this is adequate for nutrition.  He did not have any hypoglycemia.  His blood glucose ranges from 100-220 is acceptable.  If blood glucose rebounds to above 250, he will need the glucose stabilizer for glycemic control.     ______________________________ Loni Beckwith, MD     GN/MEDQ  D:  02/08/2012  T:  02/08/2012  Job:  [id number]

## 2012-02-09 ENCOUNTER — Inpatient Hospital Stay (HOSPITAL_COMMUNITY): Payer: Medicaid Other

## 2012-02-09 LAB — CBC
Platelets: 192 10*3/uL (ref 150–400)
RBC: 3.09 MIL/uL — ABNORMAL LOW (ref 4.22–5.81)
RDW: 15.2 % (ref 11.5–15.5)
WBC: 14.8 10*3/uL — ABNORMAL HIGH (ref 4.0–10.5)

## 2012-02-09 LAB — BASIC METABOLIC PANEL
Calcium: 8.3 mg/dL — ABNORMAL LOW (ref 8.4–10.5)
Creatinine, Ser: 2.1 mg/dL — ABNORMAL HIGH (ref 0.50–1.35)
GFR calc Af Amer: 41 mL/min — ABNORMAL LOW (ref >90)
GFR calc non Af Amer: 35 mL/min — ABNORMAL LOW (ref >90)
Sodium: 143 mEq/L (ref 135–145)

## 2012-02-09 LAB — GLUCOSE, CAPILLARY
Glucose-Capillary: 137 mg/dL — ABNORMAL HIGH (ref 70–99)
Glucose-Capillary: 148 mg/dL — ABNORMAL HIGH (ref 70–99)
Glucose-Capillary: 151 mg/dL — ABNORMAL HIGH (ref 70–99)
Glucose-Capillary: 275 mg/dL — ABNORMAL HIGH (ref 70–99)

## 2012-02-09 MED ORDER — POTASSIUM CHLORIDE CRYS ER 20 MEQ PO TBCR
40.0000 meq | EXTENDED_RELEASE_TABLET | Freq: Two times a day (BID) | ORAL | Status: DC
  Filled 2012-02-09: qty 2

## 2012-02-09 MED ORDER — POTASSIUM CHLORIDE 10 MEQ/100ML IV SOLN
INTRAVENOUS | Status: AC
  Filled 2012-02-09: qty 100

## 2012-02-09 MED ORDER — POTASSIUM CHLORIDE 10 MEQ/100ML IV SOLN
10.0000 meq | INTRAVENOUS | Status: AC
  Administered 2012-02-09 (×4): 10 meq via INTRAVENOUS
  Filled 2012-02-09: qty 100
  Filled 2012-02-09: qty 200

## 2012-02-09 MED ORDER — KCL IN DEXTROSE-NACL 40-5-0.45 MEQ/L-%-% IV SOLN: INTRAVENOUS | Status: DC

## 2012-02-09 MED ORDER — POTASSIUM CHLORIDE 20 MEQ/15ML (10%) PO LIQD
40.0000 meq | Freq: Two times a day (BID) | ORAL | Status: DC
  Administered 2012-02-09 (×2): 40 meq
  Filled 2012-02-09 (×2): qty 30

## 2012-02-09 NOTE — Plan of Care (Signed)
Problem: Phase II Progression Outcomes Goal: Progress activity as tolerated unless otherwise ordered Outcome: Progressing Continuing to turn patient Q2hrs while on ventilator,  Ted hose off for 42min tonight.  Reapplied with prevalon boot to left foot. Left heel remains necrotic and dry.

## 2012-02-09 NOTE — Progress Notes (Signed)
Subjective: Interval History: none.  Objective: Vital signs in last 24 hours: Temp:  [97.4 F (36.3 C)-98.5 F (36.9 C)] 98.5 F (36.9 C) (06/08 0400) Pulse Rate:  [70-104] 72  (06/08 0630) Resp:  [9-26] 16  (06/08 0630) BP: (125-181)/(51-131) 159/65 mmHg (06/08 0630) SpO2:  [93 %-100 %] 100 % (06/08 0630) FiO2 (%):  [39.7 %-50.5 %] 44.9 % (06/08 0630) Weight:  [81.1 kg (178 lb 12.7 oz)] 81.1 kg (178 lb 12.7 oz) (06/08 0500) Weight change: -4.2 kg (-9 lb 4.2 oz)  Intake/Output from previous day: 06/07 0701 - 06/08 0700 In: 2928.4 [I.V.:1996.4; NG/GT:920; IV Piggyback:12] Out: [id number]; Stool:2] Intake/Output this shift:   Patient awake but remained intubated and occasionally becomes restless. Chest he has decreased breath sound bilaterally and do some expiratory rhonchi. Heart exam is regular rate and rhythm Abdomen soft positive bowel sound Extremities trace edema.  Lab Results:  Basename 02/09/12 0439 02/08/12 0437  WBC 14.8* 18.5*  HGB 8.2* 8.5*  HCT 25.5* 26.1*  PLT 192 182   BMET:  Basename 02/09/12 0439 02/08/12 0437  NA 143 142  K 2.9* 3.2*  CL 102 103  CO2 32 27  GLUCOSE 274* 109*  BUN 95* 91*  CREATININE 2.10* 2.08*  CALCIUM 8.3* 8.6   No results found for this basename: PTH:2 in the last 72 hours Iron Studies: No results found for this basename: IRON,TIBC,TRANSFERRIN,FERRITIN in the last 72 hours  Studies/Results: Dg Chest 1v Repeat Same Day  02/07/2012  *RADIOLOGY REPORT*  Clinical Data: Repositioning of endotracheal tube  CHEST - 1 VIEW SAME DAY  Comparison: Portable exam 1640 hours compared to 1137 hours  Findings: Patient motion artifacts. Tip of endotracheal tube 3.8 cm above carina. Diffuse bilateral pulmonary infiltrates left greater than right, less well visualized than on previous study. Nasogastric tube extends into stomach. Bones appear demineralized. Left arm PICC line stable.  IMPRESSION: Satisfactory endotracheal tube position.  Limited exam showing persistent bilateral pulmonary infiltrates greater on left.  Original Report Authenticated By: Burnetta Sabin, M.D.   Dg Chest Port 1 View  02/08/2012  *RADIOLOGY REPORT*  Clinical Data: Respiratory failure.  PORTABLE CHEST - 1 VIEW  Comparison: 02/07/2012.  Findings: The endotracheal tube, NG tube and left PICC lines are stable.  There is a persistent asymmetric interstitial and airspace process but overall this shows improvement, particularly in the right lung.  No pleural effusion or pneumothorax.  IMPRESSION:  1.  Stable support apparatus. 2.  Diffuse asymmetric interstitial and airspace process with slight improvement since prior study.  Original Report Authenticated By: P. Kalman Jewels, M.D.    I have reviewed the patient's current medications.  Assessment/Plan: Problem #1 her acute kidney injury this pain is 95 creatinine is 2.1 her renal function as this moment seems to be stable. Patient presently on Lasix he has about 6 L of urine. Hence the patient is none oliguric. Problem #2 hypokalemia secondary to Lasix presently is on potassium supplement potassium has gone down to 2.9. Problem #3 history of respiratory failure most likely from aspiration pneumonia patient remained intubated. Problem #4 history of diabetes Problem #5 history of BPH Robin #6 history of anemia Problem #7 history of mental retardation. Plan: I did with  IV KCl 10 meq times 4 doses We'll hold the IV fluid with potassium until the potassium run his completed and then we'll restart him at present rate. We'll add KCl 40 mEq per NG tube twice a day. We'll followmetabolic panel in the morning.  LOS: 19 days   Elaya Droege S 02/09/2012,8:05 AM

## 2012-02-09 NOTE — Progress Notes (Signed)
Subjective: He is overall about the same. He was able to stay on pressure support throughout the day yesterday knees back on pressure support now. He looks relatively comfortable but is still having some episodes of agitation.  Objective: Vital signs in last 24 hours: Temp:  [97.4 F (36.3 C)-98.5 F (36.9 C)] 98.2 F (36.8 C) (06/08 0800) Pulse Rate:  [70-104] 85  (06/08 0800) Resp:  [9-26] 22  (06/08 0800) BP: (125-181)/(51-131) 169/76 mmHg (06/08 0800) SpO2:  [93 %-100 %] 100 % (06/08 0805) FiO2 (%):  [39.7 %-50.5 %] 44.8 % (06/08 0830) Weight:  [81.1 kg (178 lb 12.7 oz)] 81.1 kg (178 lb 12.7 oz) (06/08 0500) Weight change: -4.2 kg (-9 lb 4.2 oz) Last BM Date: 02/08/12  Intake/Output from previous day: 06/07 0701 - 06/08 0700 In: 2928.4 [I.V.:1996.4; NG/GT:920; IV Piggyback:12] Out: [id number]; Stool:2]  PHYSICAL EXAM General appearance: Intubated minimally agitated Resp: rhonchi bilaterally Cardio: regular rate and rhythm, S1, S2 normal, no murmur, click, rub or gallop GI: soft, non-tender; bowel sounds normal; no masses,  no organomegaly Extremities: extremities normal, atraumatic, no cyanosis or edema  Lab Results:    Basic Metabolic Panel:  Basename 02/09/12 0439 02/08/12 0437  NA 143 142  K 2.9* 3.2*  CL 102 103  CO2 32 27  GLUCOSE 274* 109*  BUN 95* 91*  CREATININE 2.10* 2.08*  CALCIUM 8.3* 8.6  MG -- --  PHOS 4.9* 5.8*   Liver Function Tests: No results found for this basename: AST:2,ALT:2,ALKPHOS:2,BILITOT:2,PROT:2,ALBUMIN:2 in the last 72 hours No results found for this basename: LIPASE:2,AMYLASE:2 in the last 72 hours No results found for this basename: AMMONIA:2 in the last 72 hours CBC:  Basename 02/09/12 0439 02/08/12 0437  WBC 14.8* 18.5*  NEUTROABS -- --  HGB 8.2* 8.5*  HCT 25.5* 26.1*  MCV 82.5 82.9  PLT 192 182   Cardiac Enzymes: No results found for this basename: CKTOTAL:3,CKMB:3,CKMBINDEX:3,TROPONINI:3 in the last 72  hours BNP: No results found for this basename: PROBNP:3 in the last 72 hours D-Dimer: No results found for this basename: DDIMER:2 in the last 72 hours CBG:  Basename 02/09/12 0751 02/09/12 0349 02/09/12 0009 02/08/12 2012 02/08/12 1626 02/08/12 1156  GLUCAP 275* 231* 137* 201* 230* 204*   Hemoglobin A1C: No results found for this basename: HGBA1C in the last 72 hours Fasting Lipid Panel: No results found for this basename: CHOL,HDL,LDLCALC,TRIG,CHOLHDL,LDLDIRECT in the last 72 hours Thyroid Function Tests: No results found for this basename: TSH,T4TOTAL,FREET4,T3FREE,THYROIDAB in the last 72 hours Anemia Panel: No results found for this basename: VITAMINB12,FOLATE,FERRITIN,TIBC,IRON,RETICCTPCT in the last 72 hours Coagulation: No results found for this basename: LABPROT:2,INR:2 in the last 72 hours Urine Drug Screen: Drugs of Abuse  No results found for this basename: labopia, cocainscrnur, labbenz, amphetmu, thcu, labbarb    Alcohol Level: No results found for this basename: ETH:2 in the last 72 hours Urinalysis: No results found for this basename: COLORURINE:2,APPERANCEUR:2,LABSPEC:2,PHURINE:2,GLUCOSEU:2,HGBUR:2,BILIRUBINUR:2,KETONESUR:2,PROTEINUR:2,UROBILINOGEN:2,NITRITE:2,LEUKOCYTESUR:2 in the last 72 hours Misc. Labs:  ABGS  Basename 02/08/12 0515  PHART 7.299*  PO2ART 105.0*  TCO2 25.6  HCO3 27.1*   CULTURES Recent Results (from the past 240 hour(s))  CULTURE, BLOOD (ROUTINE X 2)     Status: Normal   Collection Time   01/30/12 10:24 AM      Component Value Range Status Comment   Specimen Description BLOOD RIGHT HAND   Final    Special Requests BOTTLES DRAWN AEROBIC AND ANAEROBIC 6CC    Final    Culture NO GROWTH 5  DAYS   Final    Report Status 02/04/2012 FINAL   Final   CULTURE, BLOOD (ROUTINE X 2)     Status: Normal   Collection Time   01/30/12 10:44 AM      Component Value Range Status Comment   Specimen Description BLOOD LEFT HAND   Final    Special  Requests BOTTLES DRAWN AEROBIC AND ANAEROBIC 5CC   Final    Culture NO GROWTH 5 DAYS   Final    Report Status 02/04/2012 FINAL   Final    Studies/Results: Dg Chest 1v Repeat Same Day  02/07/2012  *RADIOLOGY REPORT*  Clinical Data: Repositioning of endotracheal tube  CHEST - 1 VIEW SAME DAY  Comparison: Portable exam 1640 hours compared to 1137 hours  Findings: Patient motion artifacts. Tip of endotracheal tube 3.8 cm above carina. Diffuse bilateral pulmonary infiltrates left greater than right, less well visualized than on previous study. Nasogastric tube extends into stomach. Bones appear demineralized. Left arm PICC line stable.  IMPRESSION: Satisfactory endotracheal tube position. Limited exam showing persistent bilateral pulmonary infiltrates greater on left.  Original Report Authenticated By: Burnetta Sabin, M.D.   Dg Chest Port 1 View  02/08/2012  *RADIOLOGY REPORT*  Clinical Data: Respiratory failure.  PORTABLE CHEST - 1 VIEW  Comparison: 02/07/2012.  Findings: The endotracheal tube, NG tube and left PICC lines are stable.  There is a persistent asymmetric interstitial and airspace process but overall this shows improvement, particularly in the right lung.  No pleural effusion or pneumothorax.  IMPRESSION:  1.  Stable support apparatus. 2.  Diffuse asymmetric interstitial and airspace process with slight improvement since prior study.  Original Report Authenticated By: P. Kalman Jewels, M.D.    Medications:  Prior to Admission:  Prescriptions prior to admission  Medication Sig Dispense Refill  . insulin aspart (NOVOLOG) 100 UNIT/ML injection Inject 1-6 Units into the skin 3 (three) times daily as needed. Per sliding scale instructions: If 150-200= 1 unit, 201-250= 2 units, 251-300= 3 units, 301-350= 4 units, 351-400= 5 units, Greater than 400= 6 units// in addition to Sliding Scale      . insulin glargine (LANTUS) 100 UNIT/ML injection Inject 30 Units into the skin at bedtime.       Marland Kitchen  lisinopril (PRINIVIL,ZESTRIL) 10 MG tablet Take 10 mg by mouth every morning.       . polyethylene glycol (MIRALAX / GLYCOLAX) packet Take 17 g by mouth every morning.       . ranitidine (ZANTAC) 150 MG capsule Take 150 mg by mouth 2 (two) times daily.        . sertraline (ZOLOFT) 50 MG tablet Take 150 mg by mouth every morning.       . sulfamethoxazole-trimethoprim (BACTRIM DS) 800-160 MG per tablet Take 1 tablet by mouth every evening.  30 tablet  1  . Tamsulosin HCl (FLOMAX) 0.4 MG CAPS Take 0.4 mg by mouth every morning.       . traZODone (DESYREL) 50 MG tablet Take 50 mg by mouth at bedtime.        . ziprasidone (GEODON) 40 MG capsule Take 40 mg by mouth 2 (two) times daily with a meal.        . acetaminophen (TYLENOL) 325 MG tablet Take 650 mg by mouth every 6 (six) hours as needed. For pain      . hyoscyamine (LEVSIN SL) 0.125 MG SL tablet Take 0.125-0.25 mg by mouth every 4 (four) hours as needed. Place one or  two tablets under tongue every 4 hours if needed for relief of abdominal pain.       Scheduled:   . albuterol  2.5 mg Nebulization Q4H   And  . ipratropium  0.5 mg Nebulization Q4H  . antiseptic oral rinse  15 mL Mouth Rinse QID  . chlorhexidine  15 mL Mouth Rinse BID  . enoxaparin (LOVENOX) injection  40 mg Subcutaneous Q24H  . famotidine  20 mg Oral Daily  . furosemide  60 mg Intravenous BID  . hydrocortisone sodium succinate  50 mg Intravenous Q6H  . insulin aspart  0-15 Units Subcutaneous Q4H  . insulin NPH  8 Units Subcutaneous Q8H  . levofloxacin (LEVAQUIN) IV  750 mg Intravenous Q48H  . LORazepam      . pantoprazole sodium  40 mg Per Tube Q1200  . polyethylene glycol  17 g Oral q morning - 10a  . potassium chloride  10 mEq Intravenous Q1 Hr x 4  . potassium chloride      . potassium chloride  40 mEq Oral BID  . sodium chloride  10-40 mL Intracatheter Q12H  . sodium chloride  10-40 mL Intracatheter Q12H  . Tamsulosin HCl  0.4 mg Oral Daily  . traZODone  50 mg  Oral QHS  . vancomycin  750 mg Intravenous Q48H  . ziprasidone  40 mg Oral BID WC  . DISCONTD: midazolam  2-4 mg Intravenous Once   Continuous:   . dextrose 5 % and 0.45 % NaCl with KCl 40 mEq/L    . feeding supplement (NEPRO CARB STEADY) 1,000 mL (02/09/12 0803)  . fentaNYL infusion INTRAVENOUS Stopped (02/09/12 0513)  . midazolam (VERSED) infusion Stopped (02/09/12 0514)  . DISCONTD: dextrose 5 % and 0.45 % NaCl with KCl 40 mEq/L 1,000 mL (02/09/12 0621)  . DISCONTD: dextrose 5 % and 0.45% NaCl 75 mL/hr at 02/08/12 1809   [id number], albuterol, ALPRAZolam, alum & mag hydroxide-simeth, dextrose, dextrose, fentaNYL, hyoscyamine, LORazepam, midazolam, morphine injection, ondansetron, sodium chloride, sodium chloride, DISCONTD: fentaNYL, DISCONTD: midazolam, DISCONTD: midazolam  Assesment: He is admitted with what appeared to be a systemic inflammatory response and sepsis. He has bilateral pneumonia. His blood sugar was uncontrolled and all of this appears to have improved. Chest x-ray is still pending. If her not able to get him off the ventilator before Monday the 10th he's going to need to have tracheostomy and PEG tube. He seems to be perhaps as good as he is going to get so I'm going to attempt extubation if his chest x-ray is not worse Active Problems:  Acute renal failure  Pneumonia  Sepsis  Mental retardation  HCAP (healthcare-associated pneumonia)  SIRS (systemic inflammatory response syndrome)  Respiratory failure  Diabetes mellitus out of control    Plan: As above    LOS: 19 days   Marcial Pless L 02/09/2012, 9:02 AM

## 2012-02-09 NOTE — Progress Notes (Signed)
Increased PT's Tidal volume to 600 this is slight above his 8 ml per kg PBW of 584, he was at 530. Hopefully this will help recruit airway's .

## 2012-02-10 ENCOUNTER — Inpatient Hospital Stay (HOSPITAL_COMMUNITY): Payer: Medicaid Other

## 2012-02-10 LAB — BLOOD GAS, ARTERIAL
Acid-Base Excess: 9.9 mmol/L — ABNORMAL HIGH (ref 0.0–2.0)
FIO2: 0.4 %
MECHVT: 600 mL
O2 Saturation: 97.5 %
PEEP: 5 cmH2O
RATE: 16 resp/min
pCO2 arterial: 42.5 mmHg (ref 35.0–45.0)

## 2012-02-10 LAB — GLUCOSE, CAPILLARY: Glucose-Capillary: 145 mg/dL — ABNORMAL HIGH (ref 70–99)

## 2012-02-10 LAB — BASIC METABOLIC PANEL
CO2: 34 mEq/L — ABNORMAL HIGH (ref 19–32)
Chloride: 100 mEq/L (ref 96–112)
Creatinine, Ser: 2.09 mg/dL — ABNORMAL HIGH (ref 0.50–1.35)
Glucose, Bld: 205 mg/dL — ABNORMAL HIGH (ref 70–99)

## 2012-02-10 MED ORDER — LISINOPRIL 10 MG PO TABS
10.0000 mg | ORAL_TABLET | Freq: Every day | ORAL | Status: DC
  Administered 2012-02-10 – 2012-02-15 (×6): 10 mg via ORAL
  Filled 2012-02-10 (×6): qty 1

## 2012-02-10 MED ORDER — HYDROCORTISONE SOD SUCCINATE 100 MG IJ SOLR
50.0000 mg | Freq: Two times a day (BID) | INTRAMUSCULAR | Status: DC
  Administered 2012-02-10 – 2012-02-12 (×4): 50 mg via INTRAVENOUS
  Filled 2012-02-10 (×4): qty 2

## 2012-02-10 MED ORDER — AMLODIPINE BESYLATE 5 MG PO TABS
5.0000 mg | ORAL_TABLET | Freq: Every day | ORAL | Status: DC
  Administered 2012-02-10 – 2012-02-15 (×6): 5 mg via ORAL
  Filled 2012-02-10 (×6): qty 1

## 2012-02-10 MED ORDER — SODIUM CHLORIDE 0.45 % IV SOLN
INTRAVENOUS | Status: DC
  Administered 2012-02-10 – 2012-02-11 (×2): via INTRAVENOUS
  Filled 2012-02-10 (×6): qty 1000

## 2012-02-10 MED ORDER — SODIUM CHLORIDE 0.9 % IJ SOLN
INTRAMUSCULAR | Status: AC
  Filled 2012-02-10: qty 6

## 2012-02-10 NOTE — Progress Notes (Signed)
At [phone] patient was extubated to 40% venturi mask, per DR. Hawkins orders. Patient with mental challenges and unable to do parameters. Patient maintaining SATs 100%, HR 83, RR 27 and BP 189/69. Rn at bedside, RT will continue to monitor. Ventilator remains at bedside and RT will encourage usage at night in BIPAP mode

## 2012-02-10 NOTE — Progress Notes (Signed)
Pt ph 7.510, pco2 42.5, po2 138, hco3 33 on fio2 40,vt 600, peep 5, prvc 16. Hopefull that chest x-ray will look better , with increased vt.  fio2 has been decreased to 35. Pt is doing better secretions decreasing. Lung sounds are better.

## 2012-02-10 NOTE — Progress Notes (Signed)
Subjective: Interval History: none.  Objective: Vital signs in last 24 hours: Temp:  [97.4 F (36.3 C)-98.3 F (36.8 C)] 98.2 F (36.8 C) (06/09 0409) Pulse Rate:  [67-96] 80  (06/09 0600) Resp:  [12-25] 25  (06/09 0600) BP: (161-184)/(59-85) 164/77 mmHg (06/09 0600) SpO2:  [100 %] 100 % (06/09 0600) FiO2 (%):  [35 %-45.4 %] 35.2 % (06/09 0600) Weight:  [81.3 kg (179 lb 3.7 oz)] 81.3 kg (179 lb 3.7 oz) (06/09 0540) Weight change: 0.2 kg (7.1 oz)  Intake/Output from previous day: 06/08 0701 - 06/09 0700 In: 2556.1 [I.V.:1464.1; NG/GT:480; IV Piggyback:612] Out: [id number]; Stool:2] Intake/Output this shift:    Patient remains intubated  Chest the he has a lateral expiratory rhonchi. Heart regular rate and rhythm Abdomen soft non-tender positive bowel sound Extremities he has trace edema.   Lab Results:  Basename 02/09/12 0439 02/08/12 0437  WBC 14.8* 18.5*  HGB 8.2* 8.5*  HCT 25.5* 26.1*  PLT 192 182   BMET:  Basename 02/10/12 0427 02/09/12 0439  NA 144 143  K 3.0* 2.9*  CL 100 102  CO2 34* 32  GLUCOSE 205* 274*  BUN 92* 95*  CREATININE 2.09* 2.10*  CALCIUM 8.6 8.3*   No results found for this basename: PTH:2 in the last 72 hours Iron Studies: No results found for this basename: IRON,TIBC,TRANSFERRIN,FERRITIN in the last 72 hours  Studies/Results: Dg Chest Port 1 View  02/09/2012  *RADIOLOGY REPORT*  Clinical Data: Respiratory failure  PORTABLE CHEST - 1 VIEW  Comparison: 02/08/2012  Findings: Cardiomediastinal silhouette is stable.  Stable NG tube position.  Endotracheal tube in place with tip 3.6 cm above the carina.  Stable left arm PICC line position with tip in SVC.  Again noted interstitial prominence and airspace disease in the left lung and right upper lobe without significant change in aeration.  IMPRESSION: Stable support apparatus.  Again noted asymmetric interstitial prominence and airspace disease in the left lung and right upper lobe without  change in aeration.  Original Report Authenticated By: [name], M.D.    I have reviewed the patient's current medications.  Assessment/Plan: Problem #1 acute kidney injury his BUN is 92 creatinine is 2.09 renal function at this moment seems to be stable. Problem #2 Hypokalemia patient on potassium supplement potassium 3 better but still remains low Problem #3 his of respiratory failure patient remains intubated. Problem #4 history of  possible CHF versus pneumonia the patient on Lasix he had about 6 L of urine on daily basis. Patient has this moment clinically does not have significant sign of fluid overload. Problem #5 history of hypertension Problem #6 history of BPH Problem #7 history of aspiration pneumonia he is on antibiotics his white blood cell count is 14.8 improving. Problem #8 anemia H&H is still low.  Plan: We'll change his IV fluid to from half-normal saline with 40 mEq of KCl to half-normal saline with 60 mEq KCl at 75 cc per hour. We'll check his basic metabolic panel in the morning We'll DC oral KCl.   LOS: 20 days   [name] S 02/10/2012,7:24 AM

## 2012-02-10 NOTE — Progress Notes (Addendum)
MD please consider a swallow evaluation for [name] due to prolonged intubation and decreased gag reflex. The patient has required sedation with morphine and lorazepam 3 times today due to tachypnea and anxiety. His med list prior to admission included zoloft and trazadone please consider restarting. Thank you.

## 2012-02-10 NOTE — Progress Notes (Signed)
Subjective: He is overall about the same. He remains intubated and on the ventilator. He's been having some problems with hypertension.  Objective: Vital signs in last 24 hours: Temp:  [97.4 F (36.3 C)-98.3 F (36.8 C)] 98.2 F (36.8 C) (06/09 0409) Pulse Rate:  [67-96] 80  (06/09 0600) Resp:  [12-25] 25  (06/09 0600) BP: (161-184)/(59-85) 164/77 mmHg (06/09 0600) SpO2:  [100 %] 100 % (06/09 0744) FiO2 (%):  [34.9 %-45.4 %] 34.9 % (06/09 0830) Weight:  [81.3 kg (179 lb 3.7 oz)] 81.3 kg (179 lb 3.7 oz) (06/09 0540) Weight change: 0.2 kg (7.1 oz) Last BM Date: 02/09/12  Intake/Output from previous day: 06/08 0701 - 06/09 0700 In: 2556.1 [I.V.:1464.1; NG/GT:480; IV Piggyback:612] Out: [id number]; Stool:2]  PHYSICAL EXAM General appearance: mild distress and Intubated and somewhat agitated Resp: rhonchi bilaterally Cardio: regular rate and rhythm, S1, S2 normal, no murmur, click, rub or gallop GI: soft, non-tender; bowel sounds normal; no masses,  no organomegaly Extremities: extremities normal, atraumatic, no cyanosis or edema  Lab Results:    Basic Metabolic Panel:  Basename 02/10/12 0427 02/09/12 0439 02/08/12 0437  NA 144 143 --  K 3.0* 2.9* --  CL 100 102 --  CO2 34* 32 --  GLUCOSE 205* 274* --  BUN 92* 95* --  CREATININE 2.09* 2.10* --  CALCIUM 8.6 8.3* --  MG -- -- --  PHOS -- 4.9* 5.8*   Liver Function Tests: No results found for this basename: AST:2,ALT:2,ALKPHOS:2,BILITOT:2,PROT:2,ALBUMIN:2 in the last 72 hours No results found for this basename: LIPASE:2,AMYLASE:2 in the last 72 hours No results found for this basename: AMMONIA:2 in the last 72 hours CBC:  Basename 02/09/12 0439 02/08/12 0437  WBC 14.8* 18.5*  NEUTROABS -- --  HGB 8.2* 8.5*  HCT 25.5* 26.1*  MCV 82.5 82.9  PLT 192 182   Cardiac Enzymes: No results found for this basename: CKTOTAL:3,CKMB:3,CKMBINDEX:3,TROPONINI:3 in the last 72 hours BNP: No results found for this  basename: PROBNP:3 in the last 72 hours D-Dimer: No results found for this basename: DDIMER:2 in the last 72 hours CBG:  Basename 02/10/12 0710 02/10/12 0357 02/09/12 2336 02/09/12 1946 02/09/12 1609 02/09/12 1135  GLUCAP 159* 187* 206* 148* 144* 151*   Hemoglobin A1C: No results found for this basename: HGBA1C in the last 72 hours Fasting Lipid Panel: No results found for this basename: CHOL,HDL,LDLCALC,TRIG,CHOLHDL,LDLDIRECT in the last 72 hours Thyroid Function Tests: No results found for this basename: TSH,T4TOTAL,FREET4,T3FREE,THYROIDAB in the last 72 hours Anemia Panel: No results found for this basename: VITAMINB12,FOLATE,FERRITIN,TIBC,IRON,RETICCTPCT in the last 72 hours Coagulation: No results found for this basename: LABPROT:2,INR:2 in the last 72 hours Urine Drug Screen: Drugs of Abuse  No results found for this basename: labopia, cocainscrnur, labbenz, amphetmu, thcu, labbarb    Alcohol Level: No results found for this basename: ETH:2 in the last 72 hours Urinalysis: No results found for this basename: COLORURINE:2,APPERANCEUR:2,LABSPEC:2,PHURINE:2,GLUCOSEU:2,HGBUR:2,BILIRUBINUR:2,KETONESUR:2,PROTEINUR:2,UROBILINOGEN:2,NITRITE:2,LEUKOCYTESUR:2 in the last 72 hours Misc. Labs:  ABGS  Basename 02/10/12 0500  PHART 7.510*  PO2ART 138.0*  TCO2 31.0  HCO3 33.7*   CULTURES No results found for this or any previous visit (from the past 240 hour(s)). Studies/Results: Dg Chest Port 1 View  02/09/2012  *RADIOLOGY REPORT*  Clinical Data: Respiratory failure  PORTABLE CHEST - 1 VIEW  Comparison: 02/08/2012  Findings: Cardiomediastinal silhouette is stable.  Stable NG tube position.  Endotracheal tube in place with tip 3.6 cm above the carina.  Stable left arm PICC line position with tip in SVC.  Again noted interstitial prominence and airspace disease in the left lung and right upper lobe without significant change in aeration.  IMPRESSION: Stable support apparatus.  Again  noted asymmetric interstitial prominence and airspace disease in the left lung and right upper lobe without change in aeration.  Original Report Authenticated By: Lahoma Crocker, M.D.    Medications:  Prior to Admission:  Prescriptions prior to admission  Medication Sig Dispense Refill  . insulin aspart (NOVOLOG) 100 UNIT/ML injection Inject 1-6 Units into the skin 3 (three) times daily as needed. Per sliding scale instructions: If 150-200= 1 unit, 201-250= 2 units, 251-300= 3 units, 301-350= 4 units, 351-400= 5 units, Greater than 400= 6 units// in addition to Sliding Scale      . insulin glargine (LANTUS) 100 UNIT/ML injection Inject 30 Units into the skin at bedtime.       Marland Kitchen lisinopril (PRINIVIL,ZESTRIL) 10 MG tablet Take 10 mg by mouth every morning.       . polyethylene glycol (MIRALAX / GLYCOLAX) packet Take 17 g by mouth every morning.       . ranitidine (ZANTAC) 150 MG capsule Take 150 mg by mouth 2 (two) times daily.        . sertraline (ZOLOFT) 50 MG tablet Take 150 mg by mouth every morning.       . sulfamethoxazole-trimethoprim (BACTRIM DS) 800-160 MG per tablet Take 1 tablet by mouth every evening.  30 tablet  1  . Tamsulosin HCl (FLOMAX) 0.4 MG CAPS Take 0.4 mg by mouth every morning.       . traZODone (DESYREL) 50 MG tablet Take 50 mg by mouth at bedtime.        . ziprasidone (GEODON) 40 MG capsule Take 40 mg by mouth 2 (two) times daily with a meal.        . acetaminophen (TYLENOL) 325 MG tablet Take 650 mg by mouth every 6 (six) hours as needed. For pain      . hyoscyamine (LEVSIN SL) 0.125 MG SL tablet Take 0.125-0.25 mg by mouth every 4 (four) hours as needed. Place one or two tablets under tongue every 4 hours if needed for relief of abdominal pain.       Scheduled:   . albuterol  2.5 mg Nebulization Q4H   And  . ipratropium  0.5 mg Nebulization Q4H  . amLODipine  5 mg Oral Daily  . antiseptic oral rinse  15 mL Mouth Rinse QID  . chlorhexidine  15 mL Mouth Rinse BID  .  enoxaparin (LOVENOX) injection  40 mg Subcutaneous Q24H  . famotidine  20 mg Oral Daily  . furosemide  60 mg Intravenous BID  . hydrocortisone sodium succinate  50 mg Intravenous Q6H  . insulin aspart  0-15 Units Subcutaneous Q4H  . insulin NPH  8 Units Subcutaneous Q8H  . pantoprazole sodium  40 mg Per Tube Q1200  . polyethylene glycol  17 g Oral q morning - 10a  . potassium chloride  10 mEq Intravenous Q1 Hr x 4  . potassium chloride      . sodium chloride  10-40 mL Intracatheter Q12H  . Tamsulosin HCl  0.4 mg Oral Daily  . traZODone  50 mg Oral QHS  . ziprasidone  40 mg Oral BID WC  . DISCONTD: levofloxacin (LEVAQUIN) IV  750 mg Intravenous Q48H  . DISCONTD: potassium chloride  40 mEq Per Tube BID  . DISCONTD: potassium chloride  40 mEq Oral BID  . DISCONTD: sodium chloride  10-40 mL Intracatheter Q12H  . DISCONTD: vancomycin  750 mg Intravenous Q48H   Continuous:   . feeding supplement (NEPRO CARB STEADY) 1,000 mL (02/09/12 0803)  . fentaNYL infusion INTRAVENOUS Stopped (02/09/12 0513)  . midazolam (VERSED) infusion 3 mg/hr (02/10/12 0600)  . sodium chloride 0.45 % with kcl    . DISCONTD: dextrose 5 % and 0.45 % NaCl with KCl 40 mEq/L 75 mL/hr at 02/09/12 1900   [id number], albuterol, ALPRAZolam, alum & mag hydroxide-simeth, dextrose, dextrose, fentaNYL, hyoscyamine, LORazepam, midazolam, morphine injection, ondansetron, sodium chloride, DISCONTD: sodium chloride  Assesment: He has acute respiratory failure. He has bilateral pneumonia. His chest x-ray looks much better today. Active Problems:  Acute renal failure  Pneumonia  Sepsis  Mental retardation  HCAP (healthcare-associated pneumonia)  SIRS (systemic inflammatory response syndrome)  Respiratory failure  Diabetes mellitus out of control    Plan: I'm going to go ahead and extubate him. If he fails then we know he will need to be restarted and then proceed with tracheostomy    LOS: 20 days    Jeani Fassnacht L 02/10/2012, 8:38 AM

## 2012-02-10 NOTE — Progress Notes (Signed)
NAME:  [name], [name]           ACCOUNT NO.:  [id number]  MEDICAL RECORD NO.:  [id number]  LOCATION:  [facility]                          FACILITY:  [facility]  PHYSICIAN:  General Wearing D. Legrand Rams, MD   DATE OF BIRTH:  [birth year]  DATE OF PROCEDURE:  02/10/2012 DATE OF DISCHARGE:                                PROGRESS NOTE   SUBJECTIVE:  The patient is still on ventilatory support.  He is just opening his eyes and unable to communicate verbally.  OBJECTIVE:  GENERAL:  The patient is orally intubated and he has also NG- tube. VITALS:  Blood pressure 161/77, pulse 64, respiratory rate 12, temperature 97 degrees Fahrenheit.  CHEST:  Poor air entry, bilateral rhonchi. CARDIOVASCULAR SYSTEM:  First and second heart sounds heard.  No murmur. No gallop.  ABDOMEN:  Soft and lax.  Bowel sound is positive.  No mass or organomegaly. EXTREMITIES:  No leg edema.  LABORATORY DATA:  ABG; pH 7.510, PCO 242, pO2 138, bicarb 31, saturation 97 on FiO2 of 0.4.  Sodium 144, potassium 3.0, chloride 100, carbon dioxide 34, glucose 205, BUN 42, creatinine 0.29, calcium 8.6.  ASSESSMENT: 1. Ventilatory dependent respiratory failure, unable to wean. 2. Bilateral pneumonia. 3. Acute on chronic renal failure. 4. History of sepsis. 5. Diabetes mellitus. 6. Hypertension. 7. Mental retardation.  PLAN:  We will continue the patient on ventilatory support as per Pulmonary recommendation.  We will continue fluid and electrolyte management according to Nephrology.  We will continue NG-tube.  We will start on Norvasc for his blood pressure.  We will discontinue antibiotics because he has pain on a combination of antibiotics over 10 days, and continue regular treatment.    Wylodean Shimmel D. Legrand Rams, MD    TDF/MEDQ  D:  02/10/2012  T:  02/10/2012  Job:  [id number]

## 2012-02-10 NOTE — Plan of Care (Signed)
Problem: Phase II Progression Outcomes Goal: Tolerating prescribed nutrition plan Outcome: Progressing Continues on nepro via og tube

## 2012-02-10 NOTE — Consult Note (Signed)
Reason for Consult: Possible trach and PEG Referring Physician: Dr. Geronimo Boot is an 51 y.o. male.  HPI: Patient has ultimately told medical problems he presented with worsening respiratory status. He was intubated. Recently he was attempted be extubated and failed and required reintubation. He has been on the vent persistently since that time. At this time there is a low suspicion of successful in immediate extubation. Patient does have a history of MRDD and has a state appointed medical power of attorney. The patient also has a history of being on anticoagulation but this is currently stopped.  Past Medical History  Diagnosis Date  . Renal disorder   . Elective mutism   . Autism   . Hyperosmolar (nonketotic) coma   . Hyperglycemia   . Diabetes mellitus   . Hyponatremia   . Mental retardation   . Urinary retention   . BPH (benign prostatic hyperplasia)   . Gastroparesis   . Depression   . Pneumonia   . UTI (urinary tract infection)   . Mental retardation     History reviewed. No pertinent past surgical history.  No family history on file.  Social History:  reports that he has never smoked. He does not have any smokeless tobacco history on file. He reports that he does not drink alcohol or use illicit drugs.  Allergies: No Known Allergies  Medications:  I have reviewed the patient's current medications. Prior to Admission:  Prescriptions prior to admission  Medication Sig Dispense Refill  . insulin aspart (NOVOLOG) 100 UNIT/ML injection Inject 1-6 Units into the skin 3 (three) times daily as needed. Per sliding scale instructions: If 150-200= 1 unit, 201-250= 2 units, 251-300= 3 units, 301-350= 4 units, 351-400= 5 units, Greater than 400= 6 units// in addition to Sliding Scale      . insulin glargine (LANTUS) 100 UNIT/ML injection Inject 30 Units into the skin at bedtime.       Marland Kitchen lisinopril (PRINIVIL,ZESTRIL) 10 MG tablet Take 10 mg by mouth every morning.        . polyethylene glycol (MIRALAX / GLYCOLAX) packet Take 17 g by mouth every morning.       . ranitidine (ZANTAC) 150 MG capsule Take 150 mg by mouth 2 (two) times daily.        . sertraline (ZOLOFT) 50 MG tablet Take 150 mg by mouth every morning.       . sulfamethoxazole-trimethoprim (BACTRIM DS) 800-160 MG per tablet Take 1 tablet by mouth every evening.  30 tablet  1  . Tamsulosin HCl (FLOMAX) 0.4 MG CAPS Take 0.4 mg by mouth every morning.       . traZODone (DESYREL) 50 MG tablet Take 50 mg by mouth at bedtime.        . ziprasidone (GEODON) 40 MG capsule Take 40 mg by mouth 2 (two) times daily with a meal.        . acetaminophen (TYLENOL) 325 MG tablet Take 650 mg by mouth every 6 (six) hours as needed. For pain      . hyoscyamine (LEVSIN SL) 0.125 MG SL tablet Take 0.125-0.25 mg by mouth every 4 (four) hours as needed. Place one or two tablets under tongue every 4 hours if needed for relief of abdominal pain.       Scheduled:   . albuterol  2.5 mg Nebulization Q4H   And  . ipratropium  0.5 mg Nebulization Q4H  . amLODipine  5 mg Oral Daily  . antiseptic  oral rinse  15 mL Mouth Rinse QID  . chlorhexidine  15 mL Mouth Rinse BID  . enoxaparin (LOVENOX) injection  40 mg Subcutaneous Q24H  . famotidine  20 mg Oral Daily  . furosemide  60 mg Intravenous BID  . hydrocortisone sodium succinate  50 mg Intravenous Q12H  . insulin aspart  0-15 Units Subcutaneous Q4H  . insulin NPH  8 Units Subcutaneous Q8H  . lisinopril  10 mg Oral Daily  . pantoprazole sodium  40 mg Per Tube Q1200  . polyethylene glycol  17 g Oral q morning - 10a  . potassium chloride  10 mEq Intravenous Q1 Hr x 4  . potassium chloride      . sodium chloride  10-40 mL Intracatheter Q12H  . Tamsulosin HCl  0.4 mg Oral Daily  . traZODone  50 mg Oral QHS  . ziprasidone  40 mg Oral BID WC  . DISCONTD: hydrocortisone sodium succinate  50 mg Intravenous Q6H  . DISCONTD: levofloxacin (LEVAQUIN) IV  750 mg Intravenous Q48H    . DISCONTD: potassium chloride  40 mEq Per Tube BID  . DISCONTD: vancomycin  750 mg Intravenous Q48H   Continuous:   . feeding supplement (NEPRO CARB STEADY) 1,000 mL (02/09/12 0803)  . fentaNYL infusion INTRAVENOUS Stopped (02/09/12 0513)  . midazolam (VERSED) infusion Stopped (02/10/12 0852)  . sodium chloride 0.45 % with kcl 75 mL/hr at 02/10/12 0949  . DISCONTD: dextrose 5 % and 0.45 % NaCl with KCl 40 mEq/L 75 mL/hr at 02/09/12 1900   [id number], albuterol, ALPRAZolam, alum & mag hydroxide-simeth, dextrose, dextrose, fentaNYL, hyoscyamine, LORazepam, midazolam, morphine injection, ondansetron, sodium chloride  Results for orders placed during the hospital encounter of 01/21/12 (from the past 48 hour(s))  GLUCOSE, CAPILLARY     Status: Abnormal   Collection Time   02/08/12  4:26 PM      Component Value Range Comment   Glucose-Capillary 230 (*) 70 - 99 (mg/dL)    Comment 1 Notify RN      Comment 2 Documented in Chart     GLUCOSE, CAPILLARY     Status: Abnormal   Collection Time   02/08/12  8:12 PM      Component Value Range Comment   Glucose-Capillary 201 (*) 70 - 99 (mg/dL)    Comment 1 Documented in Chart     GLUCOSE, CAPILLARY     Status: Abnormal   Collection Time   02/09/12 12:09 AM      Component Value Range Comment   Glucose-Capillary 137 (*) 70 - 99 (mg/dL)   GLUCOSE, CAPILLARY     Status: Abnormal   Collection Time   02/09/12  3:49 AM      Component Value Range Comment   Glucose-Capillary 231 (*) 70 - 99 (mg/dL)    Comment 1 Documented in Chart     BASIC METABOLIC PANEL     Status: Abnormal   Collection Time   02/09/12  4:39 AM      Component Value Range Comment   Sodium 143  135 - 145 (mEq/L)    Potassium 2.9 (*) 3.5 - 5.1 (mEq/L)    Chloride 102  96 - 112 (mEq/L)    CO2 32  19 - 32 (mEq/L)    Glucose, Bld 274 (*) 70 - 99 (mg/dL)    BUN 95 (*) 6 - 23 (mg/dL)    Creatinine, Ser 2.10 (*) 0.50 - 1.35 (mg/dL)    Calcium 8.3 (*) 8.4 - 10.5 (mg/dL)  GFR calc  non Af Amer 35 (*) >90 (mL/min)    GFR calc Af Amer 41 (*) >90 (mL/min)   CBC     Status: Abnormal   Collection Time   02/09/12  4:39 AM      Component Value Range Comment   WBC 14.8 (*) 4.0 - 10.5 (K/uL)    RBC 3.09 (*) 4.22 - 5.81 (MIL/uL)    Hemoglobin 8.2 (*) 13.0 - 17.0 (g/dL)    HCT 25.5 (*) 39.0 - 52.0 (%)    MCV 82.5  78.0 - 100.0 (fL)    MCH 26.5  26.0 - 34.0 (pg)    MCHC 32.2  30.0 - 36.0 (g/dL)    RDW 15.2  11.5 - 15.5 (%)    Platelets 192  150 - 400 (K/uL)   PHOSPHORUS     Status: Abnormal   Collection Time   02/09/12  4:39 AM      Component Value Range Comment   Phosphorus 4.9 (*) 2.3 - 4.6 (mg/dL)   GLUCOSE, CAPILLARY     Status: Abnormal   Collection Time   02/09/12  7:51 AM      Component Value Range Comment   Glucose-Capillary 275 (*) 70 - 99 (mg/dL)    Comment 1 Notify RN      Comment 2 Documented in Chart     GLUCOSE, CAPILLARY     Status: Abnormal   Collection Time   02/09/12 11:35 AM      Component Value Range Comment   Glucose-Capillary 151 (*) 70 - 99 (mg/dL)    Comment 1 Notify RN      Comment 2 Documented in Chart     GLUCOSE, CAPILLARY     Status: Abnormal   Collection Time   02/09/12  4:09 PM      Component Value Range Comment   Glucose-Capillary 144 (*) 70 - 99 (mg/dL)    Comment 1 Notify RN      Comment 2 Documented in Chart     GLUCOSE, CAPILLARY     Status: Abnormal   Collection Time   02/09/12  7:46 PM      Component Value Range Comment   Glucose-Capillary 148 (*) 70 - 99 (mg/dL)   GLUCOSE, CAPILLARY     Status: Abnormal   Collection Time   02/09/12 11:36 PM      Component Value Range Comment   Glucose-Capillary 206 (*) 70 - 99 (mg/dL)   GLUCOSE, CAPILLARY     Status: Abnormal   Collection Time   02/10/12  3:57 AM      Component Value Range Comment   Glucose-Capillary 187 (*) 70 - 99 (mg/dL)   BASIC METABOLIC PANEL     Status: Abnormal   Collection Time   02/10/12  4:27 AM      Component Value Range Comment   Sodium 144  135 - 145 (mEq/L)     Potassium 3.0 (*) 3.5 - 5.1 (mEq/L)    Chloride 100  96 - 112 (mEq/L)    CO2 34 (*) 19 - 32 (mEq/L)    Glucose, Bld 205 (*) 70 - 99 (mg/dL)    BUN 92 (*) 6 - 23 (mg/dL)    Creatinine, Ser 2.09 (*) 0.50 - 1.35 (mg/dL)    Calcium 8.6  8.4 - 10.5 (mg/dL)    GFR calc non Af Amer 35 (*) >90 (mL/min)    GFR calc Af Amer 41 (*) >90 (mL/min)   BLOOD GAS, ARTERIAL  Status: Abnormal   Collection Time   02/10/12  5:00 AM      Component Value Range Comment   FIO2 0.40      Delivery systems VENTILATOR      Mode PRESSURE REGULATED VOLUME CONTROL      VT 600      Rate 16      Peep/cpap 5.0      pH, Arterial 7.510 (*) 7.350 - 7.450     pCO2 arterial 42.5  35.0 - 45.0 (mmHg)    pO2, Arterial 138.0 (*) 80.0 - 100.0 (mmHg)    Bicarbonate 33.7 (*) 20.0 - 24.0 (mEq/L)    TCO2 31.0  0 - 100 (mmol/L)    Acid-Base Excess 9.9 (*) 0.0 - 2.0 (mmol/L)    O2 Saturation 97.5      Patient temperature 37.0      Collection site RADIAL      Drawn by COLLECTED BY RT      Sample type ARTERIAL      Allens test (pass/fail) PASS  PASS    GLUCOSE, CAPILLARY     Status: Abnormal   Collection Time   02/10/12  7:10 AM      Component Value Range Comment   Glucose-Capillary 159 (*) 70 - 99 (mg/dL)    Comment 1 Notify RN      Comment 2 Documented in Chart     GLUCOSE, CAPILLARY     Status: Normal   Collection Time   02/10/12 11:55 AM      Component Value Range Comment   Glucose-Capillary 93  70 - 99 (mg/dL)    Comment 1 Notify RN      Comment 2 Documented in Chart       Dg Chest Port 1 View  02/10/2012  *RADIOLOGY REPORT*  Clinical Data: Respiratory failure  PORTABLE CHEST - 1 VIEW  Comparison: 02/09/2012 and multiple priors  Findings: Satisfactory position of endotracheal tube, left upper extremity PICC, and stable course of nasogastric tube, which is followed into the stomach but continues below the edge of the image.  Bilateral airspace disease, left greater than right, persists but the aeration is significantly  improved compared to 02/09/2012 and 02/06/2012.  No visible pleural effusion.  Right lung apex excluded from the image.  Normal heart and mediastinal contours.  Prominence of the right hilar region may be due to right hilar airspace disease, with lymphadenopathy not excluded.  IMPRESSION: Improving aeration of both lungs in this patient with bilateral airspace disease.  Stable position of support devices.  Original Report Authenticated By: Curlene Dolphin, M.D.   Dg Chest Port 1 View  02/09/2012  *RADIOLOGY REPORT*  Clinical Data: Respiratory failure  PORTABLE CHEST - 1 VIEW  Comparison: 02/08/2012  Findings: Cardiomediastinal silhouette is stable.  Stable NG tube position.  Endotracheal tube in place with tip 3.6 cm above the carina.  Stable left arm PICC line position with tip in SVC.  Again noted interstitial prominence and airspace disease in the left lung and right upper lobe without significant change in aeration.  IMPRESSION: Stable support apparatus.  Again noted asymmetric interstitial prominence and airspace disease in the left lung and right upper lobe without change in aeration.  Original Report Authenticated By: Lahoma Crocker, M.D.    Review of Systems  Unable to perform ROS: intubated   Blood pressure 183/77, pulse 87, temperature 98.1 F (36.7 C), temperature source Axillary, resp. rate 32, height 5\' 10"  (1.778 m), weight 81.3 kg (179 lb 3.7  oz), SpO2 100.00%. Physical Exam  Constitutional: He appears well-developed and well-nourished. No distress.       Intubated on vent  HENT:  Head: Normocephalic and atraumatic.  Eyes: Conjunctivae and EOM are normal. Pupils are equal, round, and reactive to light.  Neck: Normal range of motion. Neck supple. No tracheal deviation present. No thyromegaly present.  Cardiovascular: Normal rate, regular rhythm and normal heart sounds.   Respiratory:       Full breath sounds bilaterally  GI: Soft. Bowel sounds are normal. He exhibits no distension. There is  no tenderness.  Lymphadenopathy:    He has no cervical adenopathy.  Neurological:       Sedated on vent  Skin: Skin is warm and dry.    Assessment/Plan: Respiratory insufficiency, chronic intubation. At this time do feel is reasonable to proceed with a tracheostomy. Plans will be to proceed with tracheostomy on Monday. I will further discuss possible PEG placement at the same time or within 24-48 hours with gastroenterology.  Betzalel Umbarger C 02/10/2012, 12:20 PM

## 2012-02-10 NOTE — Progress Notes (Signed)
5 Days Post-Op  Subjective: Patient is currently extubated. Still limited communication. Currently appears to be tolerating extubation  Objective: Vital signs in last 24 hours: Temp:  [97.4 F (36.3 C)-98.3 F (36.8 C)] 98.1 F (36.7 C) (06/09 1200) Pulse Rate:  [67-96] 87  (06/09 1200) Resp:  [12-32] 32  (06/09 1200) BP: (162-189)/(59-80) 183/77 mmHg (06/09 1200) SpO2:  [79 %-100 %] 100 % (06/09 1200) FiO2 (%):  [34.9 %-45.4 %] 40 % (06/09 1146) Weight:  [81.3 kg (179 lb 3.7 oz)] 81.3 kg (179 lb 3.7 oz) (06/09 0540) Last BM Date: 02/09/12  Intake/Output from previous day: 06/08 0701 - 06/09 0700 In: 2556.1 [I.V.:1464.1; NG/GT:480; IV Piggyback:612] Out: [id number]; Stool:2] Intake/Output this shift: Total I/O In: 28.6 [I.V.:28.6] Out: 1100 [Urine:1100]  General appearance: alert and no distress  Lab Results:   Basename 02/09/12 0439 02/08/12 0437  WBC 14.8* 18.5*  HGB 8.2* 8.5*  HCT 25.5* 26.1*  PLT 192 182   BMET  Basename 02/10/12 0427 02/09/12 0439  NA 144 143  K 3.0* 2.9*  CL 100 102  CO2 34* 32  GLUCOSE 205* 274*  BUN 92* 95*  CREATININE 2.09* 2.10*  CALCIUM 8.6 8.3*   PT/INR No results found for this basename: LABPROT:2,INR:2 in the last 72 hours ABG  Basename 02/10/12 0500 02/08/12 0515  PHART 7.510* 7.299*  HCO3 33.7* 27.1*    Studies/Results: Dg Chest Port 1 View  02/10/2012  *RADIOLOGY REPORT*  Clinical Data: Respiratory failure  PORTABLE CHEST - 1 VIEW  Comparison: 02/09/2012 and multiple priors  Findings: Satisfactory position of endotracheal tube, left upper extremity PICC, and stable course of nasogastric tube, which is followed into the stomach but continues below the edge of the image.  Bilateral airspace disease, left greater than right, persists but the aeration is significantly improved compared to 02/09/2012 and 02/06/2012.  No visible pleural effusion.  Right lung apex excluded from the image.  Normal heart and mediastinal  contours.  Prominence of the right hilar region may be due to right hilar airspace disease, with lymphadenopathy not excluded.  IMPRESSION: Improving aeration of both lungs in this patient with bilateral airspace disease.  Stable position of support devices.  Original Report Authenticated By: Curlene Dolphin, M.D.   Dg Chest Port 1 View  02/09/2012  *RADIOLOGY REPORT*  Clinical Data: Respiratory failure  PORTABLE CHEST - 1 VIEW  Comparison: 02/08/2012  Findings: Cardiomediastinal silhouette is stable.  Stable NG tube position.  Endotracheal tube in place with tip 3.6 cm above the carina.  Stable left arm PICC line position with tip in SVC.  Again noted interstitial prominence and airspace disease in the left lung and right upper lobe without significant change in aeration.  IMPRESSION: Stable support apparatus.  Again noted asymmetric interstitial prominence and airspace disease in the left lung and right upper lobe without change in aeration.  Original Report Authenticated By: Lahoma Crocker, M.D.    Anti-infectives: Anti-infectives     Start     Dose/Rate Route Frequency Ordered Stop   02/07/12 1000   vancomycin (VANCOCIN) 750 mg in sodium chloride 0.9 % 150 mL IVPB  Status:  Discontinued        750 mg 150 mL/hr over 60 Minutes Intravenous Every 48 hours 02/07/12 0747 02/10/12 0815   02/04/12 1200   vancomycin (VANCOCIN) IVPB 1000 mg/200 mL premix        1,000 mg 200 mL/hr over 60 Minutes Intravenous  Once 02/04/12 1043 02/04/12 1235   02/01/12  1200   levofloxacin (LEVAQUIN) IVPB 500 mg  Status:  Discontinued        500 mg 100 mL/hr over 60 Minutes Intravenous Every 48 hours 01/31/12 0745 02/01/12 1113   02/01/12 1200   levofloxacin (LEVAQUIN) IVPB 750 mg  Status:  Discontinued        750 mg 100 mL/hr over 90 Minutes Intravenous Every 48 hours 02/01/12 1113 02/10/12 0815   01/30/12 2100   vancomycin (VANCOCIN) IVPB 1000 mg/200 mL premix  Status:  Discontinued        1,000 mg 200 mL/hr over 60  Minutes Intravenous Every 24 hours 01/30/12 0854 01/31/12 0751   01/30/12 1130   levofloxacin (LEVAQUIN) IVPB 750 mg        750 mg 100 mL/hr over 90 Minutes Intravenous  Once 01/30/12 1021 01/30/12 1232   01/25/12 1000   fluconazole (DIFLUCAN) tablet 100 mg  Status:  Discontinued        100 mg Oral Daily 01/24/12 1225 02/08/12 0755   01/25/12 1000   vancomycin (VANCOCIN) IVPB 1000 mg/200 mL premix  Status:  Discontinued        1,000 mg 200 mL/hr over 60 Minutes Intravenous Every 12 hours 01/25/12 0830 01/30/12 0854   01/24/12 1700   azithromycin (ZITHROMAX) tablet 500 mg  Status:  Discontinued        500 mg Oral Every 24 hours 01/24/12 1222 01/29/12 0756   01/24/12 0900   fluconazole (DIFLUCAN) IVPB 100 mg  Status:  Discontinued        100 mg 50 mL/hr over 60 Minutes Intravenous Every 24 hours 01/24/12 0804 01/24/12 1224   01/22/12 1700   azithromycin (ZITHROMAX) 500 mg in dextrose 5 % 250 mL IVPB  Status:  Discontinued        500 mg 250 mL/hr over 60 Minutes Intravenous Every 24 hours 01/21/12 1747 01/24/12 1221   01/22/12 1600   cefTRIAXone (ROCEPHIN) 1 g in dextrose 5 % 50 mL IVPB  Status:  Discontinued        1 g 100 mL/hr over 30 Minutes Intravenous Every 24 hours 01/21/12 1747 01/30/12 0957   01/21/12 1600   cefTRIAXone (ROCEPHIN) 1 g in dextrose 5 % 50 mL IVPB        1 g 100 mL/hr over 30 Minutes Intravenous  Once 01/21/12 1546 01/22/12 1757   01/21/12 1600   azithromycin (ZITHROMAX) 500 mg in dextrose 5 % 250 mL IVPB        500 mg 250 mL/hr over 60 Minutes Intravenous  Once 01/21/12 1546 01/21/12 1825   01/21/12 1530   cefTRIAXone (ROCEPHIN) 1 g in dextrose 5 % 50 mL IVPB        1 g 100 mL/hr over 30 Minutes Intravenous  Once 01/21/12 1523 01/21/12 1611          Assessment/Plan: s/p * No procedures listed * I will continue to follow the patient's progress. Plans to proceed with tracheostomy placement will be held at this time. If patient continues to have  respiratory issues and does require reintubation will again entertain tracheostomy placement should that occur. Hopefully this will be a mute point.  LOS: 20 days    Ornella Coderre C 02/10/2012

## 2012-02-11 LAB — BLOOD GAS, ARTERIAL
O2 Content: 4 L/min
O2 Saturation: 97.5 %
Patient temperature: 37
pH, Arterial: 7.511 — ABNORMAL HIGH (ref 7.350–7.450)

## 2012-02-11 LAB — BASIC METABOLIC PANEL
BUN: 91 mg/dL — ABNORMAL HIGH (ref 6–23)
CO2: 39 mEq/L — ABNORMAL HIGH (ref 19–32)
Calcium: 8.5 mg/dL (ref 8.4–10.5)
Creatinine, Ser: 2.19 mg/dL — ABNORMAL HIGH (ref 0.50–1.35)
GFR calc non Af Amer: 33 mL/min — ABNORMAL LOW (ref >90)
Glucose, Bld: 239 mg/dL — ABNORMAL HIGH (ref 70–99)
Sodium: 146 mEq/L — ABNORMAL HIGH (ref 135–145)

## 2012-02-11 LAB — GLUCOSE, CAPILLARY
Glucose-Capillary: 152 mg/dL — ABNORMAL HIGH (ref 70–99)
Glucose-Capillary: 226 mg/dL — ABNORMAL HIGH (ref 70–99)

## 2012-02-11 MED ORDER — FUROSEMIDE 10 MG/ML IJ SOLN
60.0000 mg | Freq: Once | INTRAMUSCULAR | Status: AC
  Administered 2012-02-11: 60 mg via INTRAVENOUS
  Filled 2012-02-11: qty 6

## 2012-02-11 MED ORDER — POTASSIUM CHLORIDE CRYS ER 20 MEQ PO TBCR
40.0000 meq | EXTENDED_RELEASE_TABLET | Freq: Two times a day (BID) | ORAL | Status: DC
  Administered 2012-02-11 (×2): 40 meq via ORAL
  Filled 2012-02-11 (×2): qty 2

## 2012-02-11 MED ORDER — POTASSIUM CHLORIDE 10 MEQ/100ML IV SOLN
10.0000 meq | INTRAVENOUS | Status: AC
  Administered 2012-02-11 (×4): 10 meq via INTRAVENOUS
  Filled 2012-02-11: qty 400

## 2012-02-11 NOTE — Progress Notes (Signed)
NAME:  [name], [name]           ACCOUNT NO.:  [id number]  MEDICAL RECORD NO.:  [id number]  LOCATION:                                 FACILITY:  PHYSICIAN:  Cyla Haluska D. Legrand Rams, MD   DATE OF BIRTH:  April 22, 1961  DATE OF PROCEDURE:  02/11/2012 DATE OF DISCHARGE:                                PROGRESS NOTE   SUBJECTIVE:  The patient is just opening his eyes.  He was extubated, and however, unable to communicate verbally.  OBJECTIVE:  GENERAL:  The patient is just opening his eyes.  He has NG tube feeding.  He has been successfully extubated and maintaining his oxygen saturation. VITAL SIGNS:  Blood pressure 140/78, pulse 65, respiratory rate 15, temperature 97 degrees Fahrenheit. CHEST:  Decreased air entry, few rhonchi. CARDIOVASCULAR SYSTEM:  First and second heart sounds heard.  No murmur. No gallop. ABDOMEN:  Soft, and lax.  Bowel sound is positive.  No mass or organomegaly. EXTREMITIES:  No leg edema.  ASSESSMENT: 1. Status post ventilatory dependent respiratory failure. 2. Acute-on-chronic renal failure. 3. Hypokalemia. 4. Diabetes mellitus. 5. History of sepsis. 6. History of bilateral pneumonia.  PLAN:  Continue the patient on current respiratory care.  We will replace his potassium.  We will continue on current fluid and electrolyte management according to Nephrology.  Continue respiratory care.  Continue supportive treatment.     Duran Ohern D. Legrand Rams, MD     TDF/MEDQ  D:  02/11/2012  T:  02/11/2012  Job:  [id number]

## 2012-02-11 NOTE — Progress Notes (Signed)
Follow up with Dr. Legrand Rams after talking with case management about peg tube placement and offers for beds for the patient.  Dr. Legrand Rams verbalized the patient was not ready for discharge and he wanted speech to see patient for swallowing evaluation.  The patient has been off of the vent x 24 hours and is still receiving tube feedings via ngt in left nare at 40cc/hr due to decreased gag reflex after extubation.  After speech evaluation Dr. Legrand Rams will determine if peg tube is needed.

## 2012-02-11 NOTE — Progress Notes (Signed)
Tried BIPAP early in shift , pt did not tolerate well, I believe this was more due to his mental status, --anxiety-- than not being able to  breath. He wore machine approximately 1 hr. I removed and placed on 5lpm/Beemer his SAT"S still 100. When he is upset pt breaths rapid. Moves head back and forth. He has periods of rapid breathing which will speed up from 16-36 times a minute. Placed Pt on Etco2 monitor ; which is reading 36 at the time of note , f 16, SAT"S 100% on 4lpm.

## 2012-02-11 NOTE — Progress Notes (Signed)
Subjective: Interval History: none.  Objective: Vital signs in last 24 hours: Temp:  [97.3 F (36.3 C)-99 F (37.2 C)] 97.7 F (36.5 C) (06/10 0800) Pulse Rate:  [65-93] 65  (06/10 0600) Resp:  [15-33] 16  (06/10 0600) BP: (140-183)/(54-90) 169/67 mmHg (06/10 1040) SpO2:  [97 %-100 %] 100 % (06/10 0701) FiO2 (%):  [0.4 %-50 %] 50 % (06/09 1959) Weight:  [76.6 kg (168 lb 14 oz)] 76.6 kg (168 lb 14 oz) (06/10 0425) Weight change: -4.7 kg (-10 lb 5.8 oz)  Intake/Output from previous day: 06/09 0701 - 06/10 0700 In: 2574.4 [I.V.:1842.4; NG/GT:720; IV Piggyback:12] Out: [id number] [Urine:6950; Stool:2] Intake/Output this shift: Total I/O In: 306 [I.V.:160; NG/GT:40; IV Piggyback:106] Out: 250 [Urine:250]  General appearance: alert and no distress Resp: diminished breath sounds bilaterally and rhonchi bibasilar Cardio: regular rate and rhythm, S1, S2 normal, no murmur, click, rub or gallop GI: soft, non-tender; bowel sounds normal; no masses,  no organomegaly Extremities: extremities normal, atraumatic, no cyanosis or edema  Lab Results:  [facility] 02/09/12 0439  WBC 14.8*  HGB 8.2*  HCT 25.5*  PLT 192   BMET:  Basename 02/11/12 0518 02/10/12 0427  NA 146* 144  K 2.7* 3.0*  CL 99 100  CO2 39* 34*  GLUCOSE 239* 205*  BUN 91* 92*  CREATININE 2.19* 2.09*  CALCIUM 8.5 8.6   No results found for this basename: PTH:2 in the last 72 hours Iron Studies: No results found for this basename: IRON,TIBC,TRANSFERRIN,FERRITIN in the last 72 hours  Studies/Results: Dg Chest Port 1 View  02/10/2012  *RADIOLOGY REPORT*  Clinical Data: Respiratory failure  PORTABLE CHEST - 1 VIEW  Comparison: 02/09/2012 and multiple priors  Findings: Satisfactory position of endotracheal tube, left upper extremity PICC, and stable course of nasogastric tube, which is followed into the stomach but continues below the edge of the image.  Bilateral airspace disease, left greater than right, persists but the  aeration is significantly improved compared to 02/09/2012 and 02/06/2012.  No visible pleural effusion.  Right lung apex excluded from the image.  Normal heart and mediastinal contours.  Prominence of the right hilar region may be due to right hilar airspace disease, with lymphadenopathy not excluded.  IMPRESSION: Improving aeration of both lungs in this patient with bilateral airspace disease.  Stable position of support devices.  Original Report Authenticated By: Curlene Dolphin, M.D.    I have reviewed the patient's current medications.  Assessment/Plan: Problem #1 acute kidney injury his BUN and creatinine slightly high but overall stable. Urine output is about 6 L. Problem #2 hypokalemia is on potassium supplement however his potassium seems to be declining most likely secondary to diuretics. Problem #3 respiratory failure patient is extubated Problem #4 history of aspiration pneumonia is a febrile. Problem #5 history of anemia his H&H stable Problem #6 history of diabetes Problem #7 history of BPH he has a Foley catheter.  Plan: We'll change Lasix 60 mg IV once a day We'll increase it is KCl 40 mEq per NG twice a day. We'll follow his basic metabolic panel in the morning.   LOS: 21 days   Chairty Toman S 02/11/2012,10:50 AM

## 2012-02-11 NOTE — Progress Notes (Signed)
Subjective: He was able be extubated yesterday and seems to be doing okay. He has no new complaints. He is arousable but has received some sedation.  Objective: Vital signs in last 24 hours: Temp:  [97.3 F (36.3 C)-99 F (37.2 C)] 97.9 F (36.6 C) (06/10 0425) Pulse Rate:  [65-93] 65  (06/10 0600) Resp:  [15-33] 16  (06/10 0600) BP: (140-189)/(54-90) 156/66 mmHg (06/10 0600) SpO2:  [79 %-100 %] 100 % (06/10 0701) FiO2 (%):  [0.4 %-50 %] 50 % (06/09 1959) Weight:  [76.6 kg (168 lb 14 oz)] 76.6 kg (168 lb 14 oz) (06/10 0425) Weight change: -4.7 kg (-10 lb 5.8 oz) Last BM Date: 02/10/12  Intake/Output from previous day: 06/09 0701 - 06/10 0700 In: 2499.4 [I.V.:1767.4; NG/GT:720; IV Piggyback:12] Out: [id number] [Urine:6950; Stool:2]  PHYSICAL EXAM General appearance: Off the ventilator receiving sedation not as responsive as normal a Resp: rhonchi bilaterally Cardio: regular rate and rhythm, S1, S2 normal, no murmur, click, rub or gallop GI: soft, non-tender; bowel sounds normal; no masses,  no organomegaly Extremities: extremities normal, atraumatic, no cyanosis or edema  Lab Results:    Basic Metabolic Panel:  Basename 02/11/12 0518 02/10/12 0427 02/09/12 0439  NA 146* 144 --  K 2.7* 3.0* --  CL 99 100 --  CO2 39* 34* --  GLUCOSE 239* 205* --  BUN 91* 92* --  CREATININE 2.19* 2.09* --  CALCIUM 8.5 8.6 --  MG -- -- --  PHOS -- -- 4.9*   Liver Function Tests: No results found for this basename: AST:2,ALT:2,ALKPHOS:2,BILITOT:2,PROT:2,ALBUMIN:2 in the last 72 hours No results found for this basename: LIPASE:2,AMYLASE:2 in the last 72 hours No results found for this basename: AMMONIA:2 in the last 72 hours CBC:  Basename 02/09/12 0439  WBC 14.8*  NEUTROABS --  HGB 8.2*  HCT 25.5*  MCV 82.5  PLT 192   Cardiac Enzymes: No results found for this basename: CKTOTAL:3,CKMB:3,CKMBINDEX:3,TROPONINI:3 in the last 72 hours BNP: No results found for this basename: PROBNP:3  in the last 72 hours D-Dimer: No results found for this basename: DDIMER:2 in the last 72 hours CBG:  Basename 02/11/12 0444 02/10/12 2358 02/10/12 2000 02/10/12 1555 02/10/12 1155 02/10/12 0710  GLUCAP 226* 149* 73 145* 93 159*   Hemoglobin A1C: No results found for this basename: HGBA1C in the last 72 hours Fasting Lipid Panel: No results found for this basename: CHOL,HDL,LDLCALC,TRIG,CHOLHDL,LDLDIRECT in the last 72 hours Thyroid Function Tests: No results found for this basename: TSH,T4TOTAL,FREET4,T3FREE,THYROIDAB in the last 72 hours Anemia Panel: No results found for this basename: VITAMINB12,FOLATE,FERRITIN,TIBC,IRON,RETICCTPCT in the last 72 hours Coagulation: No results found for this basename: LABPROT:2,INR:2 in the last 72 hours Urine Drug Screen: Drugs of Abuse  No results found for this basename: labopia, cocainscrnur, labbenz, amphetmu, thcu, labbarb    Alcohol Level: No results found for this basename: ETH:2 in the last 72 hours Urinalysis: No results found for this basename: COLORURINE:2,APPERANCEUR:2,LABSPEC:2,PHURINE:2,GLUCOSEU:2,HGBUR:2,BILIRUBINUR:2,KETONESUR:2,PROTEINUR:2,UROBILINOGEN:2,NITRITE:2,LEUKOCYTESUR:2 in the last 72 hours Misc. Labs:  ABGS  Basename 02/11/12 0507  PHART 7.511*  PO2ART 91.1  TCO2 36.8  HCO3 39.7*   CULTURES No results found for this or any previous visit (from the past 240 hour(s)). Studies/Results: Dg Chest Port 1 View  02/10/2012  *RADIOLOGY REPORT*  Clinical Data: Respiratory failure  PORTABLE CHEST - 1 VIEW  Comparison: 02/09/2012 and multiple priors  Findings: Satisfactory position of endotracheal tube, left upper extremity PICC, and stable course of nasogastric tube, which is followed into the stomach but continues below the  edge of the image.  Bilateral airspace disease, left greater than right, persists but the aeration is significantly improved compared to 02/09/2012 and 02/06/2012.  No visible pleural effusion.  Right  lung apex excluded from the image.  Normal heart and mediastinal contours.  Prominence of the right hilar region may be due to right hilar airspace disease, with lymphadenopathy not excluded.  IMPRESSION: Improving aeration of both lungs in this patient with bilateral airspace disease.  Stable position of support devices.  Original Report Authenticated By: Curlene Dolphin, M.D.   Dg Chest Port 1 View  02/09/2012  *RADIOLOGY REPORT*  Clinical Data: Respiratory failure  PORTABLE CHEST - 1 VIEW  Comparison: 02/08/2012  Findings: Cardiomediastinal silhouette is stable.  Stable NG tube position.  Endotracheal tube in place with tip 3.6 cm above the carina.  Stable left arm PICC line position with tip in SVC.  Again noted interstitial prominence and airspace disease in the left lung and right upper lobe without significant change in aeration.  IMPRESSION: Stable support apparatus.  Again noted asymmetric interstitial prominence and airspace disease in the left lung and right upper lobe without change in aeration.  Original Report Authenticated By: Lahoma Crocker, M.D.    Medications:  Scheduled:   . albuterol  2.5 mg Nebulization Q4H   And  . ipratropium  0.5 mg Nebulization Q4H  . amLODipine  5 mg Oral Daily  . antiseptic oral rinse  15 mL Mouth Rinse QID  . chlorhexidine  15 mL Mouth Rinse BID  . enoxaparin (LOVENOX) injection  40 mg Subcutaneous Q24H  . famotidine  20 mg Oral Daily  . furosemide  60 mg Intravenous BID  . hydrocortisone sodium succinate  50 mg Intravenous Q12H  . insulin aspart  0-15 Units Subcutaneous Q4H  . insulin NPH  8 Units Subcutaneous Q8H  . lisinopril  10 mg Oral Daily  . pantoprazole sodium  40 mg Per Tube Q1200  . polyethylene glycol  17 g Oral q morning - 10a  . potassium chloride  10 mEq Intravenous Q1 Hr x 4  . sodium chloride  10-40 mL Intracatheter Q12H  . sodium chloride      . Tamsulosin HCl  0.4 mg Oral Daily  . traZODone  50 mg Oral QHS  . ziprasidone  40 mg Oral  BID WC  . DISCONTD: hydrocortisone sodium succinate  50 mg Intravenous Q6H  . DISCONTD: levofloxacin (LEVAQUIN) IV  750 mg Intravenous Q48H  . DISCONTD: vancomycin  750 mg Intravenous Q48H   Continuous:   . feeding supplement (NEPRO CARB STEADY) 1,000 mL (02/09/12 0803)  . sodium chloride 0.45 % with kcl 75 mL/hr at 02/10/12 1800  . DISCONTD: fentaNYL infusion INTRAVENOUS Stopped (02/09/12 0513)  . DISCONTD: midazolam (VERSED) infusion Stopped (02/10/12 [id number])   [id number], albuterol, ALPRAZolam, alum & mag hydroxide-simeth, dextrose, dextrose, hyoscyamine, LORazepam, morphine injection, ondansetron, sodium chloride, DISCONTD: fentaNYL, DISCONTD: midazolam  Assesment: He had acute respiratory failure on the basis of bilateral pneumonia. His chest x-ray improved markedly yesterday and he was able to be extubated and stayed off the ventilator this time. Overall I think she's doing better. He may still need a feeding tube because I don't think is going to keep up his nutritional requirements. Active Problems:  Acute renal failure  Pneumonia  Sepsis  Mental retardation  HCAP (healthcare-associated pneumonia)  SIRS (systemic inflammatory response syndrome)  Respiratory failure  Diabetes mellitus out of control    Plan: No change in treatments  LOS: 21 days   [name] 02/11/2012, 7:42 AM

## 2012-02-11 NOTE — Evaluation (Signed)
Clinical/Bedside Swallow Evaluation Patient Details  Name: [name] MRN: [id number] Date of Birth: 29-Oct-1960  Today's Date: 02/11/2012 Time: [id number] SLP Time Calculation (min): 24 min  Past Medical History:  Past Medical History  Diagnosis Date  . Renal disorder   . Elective mutism   . Autism   . Hyperosmolar (nonketotic) coma   . Hyperglycemia   . Diabetes mellitus   . Hyponatremia   . Mental retardation   . Urinary retention   . BPH (benign prostatic hyperplasia)   . Gastroparesis   . Depression   . Pneumonia   . UTI (urinary tract infection)   . Mental retardation    Past Surgical History: History reviewed. No pertinent past surgical history.  HPI:  [name] is a 51 yo man who was admitted 01/21/2012 with bilateral pneumonia and low blood sugar. His past medical history is significant for mental retardatation, ventilator dependent respiratory failure, sepsis, DM, HTN and acute on chronic renal failure. He was extubated yesterday (has required on and off intubation/pressure support). Dr. Legrand Rams requested a swallow evaluation before PEG placement.   Assessment / Plan / Recommendation Clinical Impression  Suspected pharyngeal phase dysphagia in setting of pt with MR, on and off intubation, and NG in place. The patient is non-verbal at baseline and no vocalizations were heard today. Swallows were paired with facial grimmace perhaps due to NG in place and strong coughs were elicited after sips thin liquid and delayed cough after nectars. MBS is strongly recommended to assist with diet recommendations before deciding on PEG. I anticipate that [name] will be able to tolerate a modified diet by mouth. MBS planned for Tuesday.    Aspiration Risk  Moderate    Diet Recommendation NPO;Alternative means - temporary        Other  Recommendations Recommended Consults: MBS   Follow Up Recommendations       Frequency and Duration min 2x/week  1 week     Pertinent Vitals/Pain     SLP Swallow Goals  Pending results of MBS   Swallow Study Prior Functional Status   Pt lived at a facility prior to admission.    General Date of Onset: 01/21/12 HPI: [name] is a 50 yo man who was admitted 01/21/2012 with bilateral pneumonia and low blood sugar. His past medical history is significant for mental retardatation, ventilator dependent respiratory failure, sepsis, DM, HTN and acute on chronic renal failure. He was extubated yesterday (has required on and off intubation/pressure support). Dr. Legrand Rams requested a swallow evaluation before PEG placement. Type of Study: Bedside swallow evaluation Previous Swallow Assessment: unknown Diet Prior to this Study: NPO Respiratory Status: Supplemental O2 delivered via (comment) (nasal canula) History of Recent Intubation: Yes Length of Intubations (days): 2 days Date extubated: 02/10/12 Behavior/Cognition: Requires cueing;Decreased sustained attention Oral Cavity - Dentition:  (missing some dentition) Self-Feeding Abilities: Total assist Patient Positioning: Upright in bed Baseline Vocal Quality: Aphonic Volitional Cough: Congested;Cognitively unable to elicit;Strong (spontaneous cough) Volitional Swallow: Unable to elicit    Oral/Motor/Sensory Function Overall Oral Motor/Sensory Function:  (loss of secretions orally with open mouth breathing, no gag ) Labial Strength: Reduced   Ice Chips Ice chips: Impaired Presentation: Spoon Oral Phase Impairments: Reduced labial seal Oral Phase Functional Implications: Prolonged oral transit Pharyngeal Phase Impairments: Suspected delayed Swallow   Thin Liquid Thin Liquid: Impaired Presentation: Straw Pharyngeal  Phase Impairments: Suspected delayed Swallow;Cough - Immediate;Change in Vital Signs    Nectar Thick Nectar Thick Liquid: Impaired Presentation: Spoon  Oral phase functional implications: Prolonged oral transit Pharyngeal Phase Impairments:  Suspected delayed Swallow;Cough - Delayed   Honey Thick Honey Thick Liquid: Not tested   Puree Puree: Impaired (facial grimmace with most swallows) Presentation: Spoon Oral Phase Functional Implications: Oral residue Other Comments: small amount oral residue   Solid Solid: Not tested    [name] 02/11/2012,5:29 PM

## 2012-02-11 NOTE — Progress Notes (Signed)
Holding 0300 am treatment to let pt sleep as much as possible, he is sleeping very well. Etco2 39

## 2012-02-11 NOTE — Progress Notes (Signed)
NAME:  [name], [name]           ACCOUNT NO.:  [id number]  MEDICAL RECORD NO.:  [id number]  LOCATION:                                 FACILITY:  PHYSICIAN:  Nalayah Hitt D. Legrand Rams, MD   DATE OF BIRTH:  [birth year]  DATE OF PROCEDURE:  02/09/2012 DATE OF DISCHARGE:                                PROGRESS NOTE   SUBJECTIVE:  The patient is just opening his eyes, unable to communicate.  OBJECTIVE:  GENERAL:  The patient is on ventilatory support.  Orally intubated. VITAL SIGNS:  Blood pressure 125/112, pulse 70, respiratory rate 26, temperature 98.5 degrees Fahrenheit. CHEST:  Decreased air entry, few rhonchi. CARDIOVASCULAR SYSTEM:  First and second heart sounds heard.  No murmur. No gallop. ABDOMEN:  Soft and lax.  Bowel sound is positive.  No mass or organomegaly. EXTREMITIES:  No leg edema.  LABORATORY DATA:  Sodium 143, potassium 2.9, chloride 102, carbon dioxide 32, glucose 272, BUN 95, creatinine 2.10, calcium 8.3.  CBC, WBC 14.8, hemoglobin 8.2, hematocrit 25.5, and platelets 192.  ASSESSMENT: 1. Ventilatory dependent respiratory failure. 2. Bilateral pneumonia. 3. Sepsis. 4. Acute on chronic renal failure. 5. Diabetes mellitus.  PLAN:  Continue the patient on ventilatory support.  Continue NG tube feeding.  We will plan for weaning trial according to pulmonary recommendation.  If the patient fails weaning, he is planned for tracheostomy placement.  Continue supportive care.     Jorgeluis Gurganus D. Legrand Rams, MD     TDF/MEDQ  D:  02/09/2012  T:  02/09/2012  Job:  [id number]

## 2012-02-12 ENCOUNTER — Inpatient Hospital Stay (HOSPITAL_COMMUNITY): Payer: Medicaid Other

## 2012-02-12 LAB — BASIC METABOLIC PANEL
BUN: 89 mg/dL — ABNORMAL HIGH (ref 6–23)
CO2: 38 mEq/L — ABNORMAL HIGH (ref 19–32)
Calcium: 8.5 mg/dL (ref 8.4–10.5)
GFR calc non Af Amer: 33 mL/min — ABNORMAL LOW (ref >90)
Glucose, Bld: 144 mg/dL — ABNORMAL HIGH (ref 70–99)
Potassium: 3.2 mEq/L — ABNORMAL LOW (ref 3.5–5.1)

## 2012-02-12 LAB — GLUCOSE, CAPILLARY
Glucose-Capillary: 128 mg/dL — ABNORMAL HIGH (ref 70–99)
Glucose-Capillary: 47 mg/dL — ABNORMAL LOW (ref 70–99)
Glucose-Capillary: 294 mg/dL — ABNORMAL HIGH (ref 70–99)

## 2012-02-12 MED ORDER — INSULIN NPH (HUMAN) (ISOPHANE) 100 UNIT/ML ~~LOC~~ SUSP
5.0000 [IU] | Freq: Three times a day (TID) | SUBCUTANEOUS | Status: DC
  Administered 2012-02-13 – 2012-02-14 (×4): 5 [IU] via SUBCUTANEOUS
  Filled 2012-02-12: qty 10

## 2012-02-12 MED ORDER — SODIUM CHLORIDE 0.45 % IV SOLN
INTRAVENOUS | Status: DC
  Administered 2012-02-12: 13:00:00 via INTRAVENOUS
  Filled 2012-02-12 (×3): qty 1000

## 2012-02-12 MED ORDER — POTASSIUM CHLORIDE CRYS ER 20 MEQ PO TBCR: 40.0000 meq | EXTENDED_RELEASE_TABLET | Freq: Three times a day (TID) | ORAL | Status: DC

## 2012-02-12 MED ORDER — DEXTROSE 50 % IV SOLN
INTRAVENOUS | Status: AC
  Administered 2012-02-12: 50 mL
  Filled 2012-02-12: qty 50

## 2012-02-12 MED ORDER — KCL IN DEXTROSE-NACL 20-5-0.45 MEQ/L-%-% IV SOLN
INTRAVENOUS | Status: DC
  Administered 2012-02-12: 17:00:00 via INTRAVENOUS
  Administered 2012-02-13 (×2): 100 mL via INTRAVENOUS
  Administered 2012-02-13: 02:00:00 via INTRAVENOUS

## 2012-02-12 MED ORDER — POTASSIUM CHLORIDE 20 MEQ/15ML (10%) PO SOLN
ORAL | Status: AC
  Administered 2012-02-12: 40 meq via ORAL
  Filled 2012-02-12: qty 30

## 2012-02-12 MED ORDER — POTASSIUM CHLORIDE 20 MEQ/15ML (10%) PO LIQD
40.0000 meq | Freq: Three times a day (TID) | ORAL | Status: DC
  Administered 2012-02-12 – 2012-02-13 (×6): 40 meq via ORAL
  Filled 2012-02-12 (×5): qty 30

## 2012-02-12 NOTE — Progress Notes (Signed)
Subjective: He continues to make steady improvement. He has tolerated extubation well. He had an evaluation by a speech and is to have a modified barium swallow today but at least based on the bedside evaluation he may not need a PEG tube.  Objective: Vital signs in last 24 hours: Temp:  [97.9 F (36.6 C)-99 F (37.2 C)] 97.9 F (36.6 C) (06/11 0400) Pulse Rate:  [74-98] 82  (06/11 0600) Resp:  [14-29] 20  (06/11 0600) BP: (113-183)/(57-89) 159/66 mmHg (06/11 0600) SpO2:  [89 %-100 %] 100 % (06/11 0714) Weight:  [71.9 kg (158 lb 8.2 oz)] 71.9 kg (158 lb 8.2 oz) (06/11 0500) Weight change: -4.7 kg (-10 lb 5.8 oz) Last BM Date: 02/11/12  Intake/Output from previous day: 06/10 0701 - 06/11 0700 In: 3027 [I.V.:1735; NG/GT:880; IV Piggyback:412] Out: 4700 [Urine:4700]  PHYSICAL EXAM General appearance: He is getting closer to his baseline mental status. He does respond to requests. Resp: rhonchi bilaterally Cardio: regular rate and rhythm, S1, S2 normal, no murmur, click, rub or gallop GI: soft, non-tender; bowel sounds normal; no masses,  no organomegaly Extremities: extremities normal, atraumatic, no cyanosis or edema  Lab Results:    Basic Metabolic Panel:  Basename 02/12/12 0409 02/11/12 0518  NA 151* 146*  K 3.2* 2.7*  CL 104 99  CO2 38* 39*  GLUCOSE 144* 239*  BUN 89* 91*  CREATININE 2.21* 2.19*  CALCIUM 8.5 8.5  MG -- --  PHOS -- --   Liver Function Tests: No results found for this basename: AST:2,ALT:2,ALKPHOS:2,BILITOT:2,PROT:2,ALBUMIN:2 in the last 72 hours No results found for this basename: LIPASE:2,AMYLASE:2 in the last 72 hours No results found for this basename: AMMONIA:2 in the last 72 hours CBC: No results found for this basename: WBC:2,NEUTROABS:2,HGB:2,HCT:2,MCV:2,PLT:2 in the last 72 hours Cardiac Enzymes: No results found for this basename: CKTOTAL:3,CKMB:3,CKMBINDEX:3,TROPONINI:3 in the last 72 hours BNP: No results found for this basename:  PROBNP:3 in the last 72 hours D-Dimer: No results found for this basename: DDIMER:2 in the last 72 hours CBG:  Basename 02/12/12 0721 02/12/12 0419 02/12/12 0359 02/11/12 2358 02/11/12 1941 02/11/12 1532  GLUCAP 93 128* 47* 152* 231* 147*   Hemoglobin A1C: No results found for this basename: HGBA1C in the last 72 hours Fasting Lipid Panel: No results found for this basename: CHOL,HDL,LDLCALC,TRIG,CHOLHDL,LDLDIRECT in the last 72 hours Thyroid Function Tests: No results found for this basename: TSH,T4TOTAL,FREET4,T3FREE,THYROIDAB in the last 72 hours Anemia Panel: No results found for this basename: VITAMINB12,FOLATE,FERRITIN,TIBC,IRON,RETICCTPCT in the last 72 hours Coagulation: No results found for this basename: LABPROT:2,INR:2 in the last 72 hours Urine Drug Screen: Drugs of Abuse  No results found for this basename: labopia, cocainscrnur, labbenz, amphetmu, thcu, labbarb    Alcohol Level: No results found for this basename: ETH:2 in the last 72 hours Urinalysis: No results found for this basename: COLORURINE:2,APPERANCEUR:2,LABSPEC:2,PHURINE:2,GLUCOSEU:2,HGBUR:2,BILIRUBINUR:2,KETONESUR:2,PROTEINUR:2,UROBILINOGEN:2,NITRITE:2,LEUKOCYTESUR:2 in the last 72 hours Misc. Labs:  ABGS  Basename 02/11/12 0507  PHART 7.511*  PO2ART 91.1  TCO2 36.8  HCO3 39.7*   CULTURES No results found for this or any previous visit (from the past 240 hour(s)). Studies/Results: No results found.  Medications:  Scheduled:   . albuterol  2.5 mg Nebulization Q4H   And  . ipratropium  0.5 mg Nebulization Q4H  . amLODipine  5 mg Oral Daily  . antiseptic oral rinse  15 mL Mouth Rinse QID  . chlorhexidine  15 mL Mouth Rinse BID  . dextrose      . enoxaparin (LOVENOX) injection  40  mg Subcutaneous Q24H  . famotidine  20 mg Oral Daily  . furosemide  60 mg Intravenous Once  . hydrocortisone sodium succinate  50 mg Intravenous Q12H  . insulin aspart  0-15 Units Subcutaneous Q4H  . insulin NPH   8 Units Subcutaneous Q8H  . lisinopril  10 mg Oral Daily  . pantoprazole sodium  40 mg Per Tube Q1200  . polyethylene glycol  17 g Oral q morning - 10a  . potassium chloride  10 mEq Intravenous Q1 Hr x 4  . potassium chloride  40 mEq Oral BID  . sodium chloride  10-40 mL Intracatheter Q12H  . sodium chloride      . Tamsulosin HCl  0.4 mg Oral Daily  . traZODone  50 mg Oral QHS  . ziprasidone  40 mg Oral BID WC  . DISCONTD: furosemide  60 mg Intravenous BID   Continuous:   . feeding supplement (NEPRO CARB STEADY) 1,000 mL (02/09/12 0803)  . sodium chloride 0.45 % with kcl 75 mL/hr at 02/12/12 0600   [id number], albuterol, ALPRAZolam, alum & mag hydroxide-simeth, dextrose, dextrose, hyoscyamine, LORazepam, morphine injection, ondansetron, sodium chloride  Assesment: He has had bilateral pneumonia. He had respiratory failure because of that. He had sepsis. He has improved. He does have some renal dysfunction but overall is making steady progress. Active Problems:  Acute renal failure  Pneumonia  Sepsis  Mental retardation  HCAP (healthcare-associated pneumonia)  SIRS (systemic inflammatory response syndrome)  Respiratory failure  Diabetes mellitus out of control    Plan: He'll have another chest x-ray. Continue with his current medications and treatments. He has been on a "stress dose" of Solu-Cortef which I think we can stop now    LOS: 22 days   [name] 02/12/2012, 8:33 AM

## 2012-02-12 NOTE — Procedures (Signed)
Objective Swallowing Evaluation: Modified Barium Swallowing Study  Patient Details  Name: KYLOR HORIGAN MRN: [id number] Date of Birth: February 11, 1961  Today's Date: 02/12/2012 Time: [id number] SLP Time Calculation (min): 29 min  Past Medical History:  Past Medical History  Diagnosis Date  . Renal disorder   . Elective mutism   . Autism   . Hyperosmolar (nonketotic) coma   . Hyperglycemia   . Diabetes mellitus   . Hyponatremia   . Mental retardation   . Urinary retention   . BPH (benign prostatic hyperplasia)   . Gastroparesis   . Depression   . Pneumonia   . UTI (urinary tract infection)   . Mental retardation    Past Surgical History: History reviewed. No pertinent past surgical history.  HPI:  Quintel Delfierro is a 51 yo man who was admitted 01/21/2012 with bilateral pneumonia and low blood sugar. His past medical history is significant for mental retardatation, ventilator dependent respiratory failure, sepsis, DM, HTN and acute on chronic renal failure. He was extubated Sunday (has required on and off intubation/pressure support). Dr. Legrand Rams requested a swallow evaluation before PEG placement.     Assessment / Plan / Recommendation Clinical Impression  Dysphagia Diagnosis: Moderate oral phase dysphagia;Moderate pharyngeal phase dysphagia Clinical impression: Moderate oropharyngeal phase dysphagia. Pt required cuing for lip closure around spoon and cup and had mild residuals with liquids orally after swallow. Pharyngeal swallow is delayed in initiation, triggering after spilling to the pyriforms for nectars/ honey. Epiglottic deflection was intially mod/severe reduced, however improved somewhat after removal of NG and continued to improve throughout the study. Penetration occurred during the swallow with residuals also contributing to penetration with additional swallows. Pt had one long and excessive episode of strong coughing (~25 seconds) after sips of nectar and aspiration is  suspected although not witnessed. Pt did best with teaspoon presentations of honey-thick liquids and puree textures with head in neutral (not back) position with cues for repeat swallows to help clear pharyngeal residuals. Pt is at risk for aspiration, however risks can be minimized with modified diet and compensatory strategies.     Treatment Recommendation  Therapy as outlined in treatment plan below    Diet Recommendation Dysphagia 1 (Puree);Honey-thick liquid   Liquid Administration via: Spoon (liquids only presented by SPOON) Medication Administration: Crushed with puree Supervision: Staff feed patient;Full supervision/cueing for compensatory strategies Compensations: Slow rate;Small sips/bites;Check for anterior loss;Multiple dry swallows after each bite/sip Postural Changes and/or Swallow Maneuvers: Seated upright 90 degrees;Upright 30-60 min after meal    Other  Recommendations Oral Care Recommendations: Oral care QID;Staff/trained caregiver to provide oral care Other Recommendations: Order thickener from pharmacy;Clarify dietary restrictions   Follow Up Recommendations  Skilled Nursing facility    Frequency and Duration min 2x/week  1 week   Pertinent Vitals/Pain     SLP Swallow Goals Patient will consume recommended diet without observed clinical signs of aspiration with: Maximum assistance Patient will utilize recommended strategies during swallow to increase swallowing safety with: Maximum assistance   General Date of Onset: 01/21/12 HPI: Jakob Juszczyk is a 51 yo man who was admitted 01/21/2012 with bilateral pneumonia and low blood sugar. His past medical history is significant for mental retardatation, ventilator dependent respiratory failure, sepsis, DM, HTN and acute on chronic renal failure. He was extubated Sunday (has required on and off intubation/pressure support). Dr. Legrand Rams requested a swallow evaluation before PEG placement. Type of Study: Modified Barium  Swallowing Study Reason for Referral: Objectively evaluate swallowing function Previous Swallow Assessment:  BSE 02/11/2012 Diet Prior to this Study: NPO Temperature Spikes Noted: No Respiratory Status: Supplemental O2 delivered via (comment) (nasal canula) History of Recent Intubation: Yes Length of Intubations (days): 2 days Date extubated: 02/10/12 Behavior/Cognition: Requires cueing;Decreased sustained attention Oral Cavity - Dentition: Missing dentition (missing some dentition) Oral Motor / Sensory Function: Impaired - see Bedside swallow eval Self-Feeding Abilities: Total assist Patient Positioning: Upright in chair Baseline Vocal Quality: Aphonic Volitional Cough: Congested;Cognitively unable to elicit;Strong (spontaneous cough) Volitional Swallow: Unable to elicit Anatomy: Within functional limits (NG tube initially in place, but removed) Pharyngeal Secretions: Not observed secondary MBS    Reason for Referral Objectively evaluate swallowing function   Oral Phase  Impaired  Pharyngeal Phase Pharyngeal Phase: Impaired   Cervical Esophageal Phase Cervical Esophageal Phase: [facility]   Thank you,  Genene Churn, [place]  Vanshika Jastrzebski 02/12/2012, 4:19 PM

## 2012-02-12 NOTE — Clinical Social Work Note (Addendum)
CSW spoke with Jari Sportsman at [facility] who accepts bed at [facility].  Facility aware.  Awaiting stability for d/c. CSW to continue to follow. Medicaid prior approval submitted.   [name], [place]

## 2012-02-12 NOTE — Clinical Social Work Placement (Signed)
Clinical Social Work Department CLINICAL SOCIAL WORK PLACEMENT NOTE 02/12/2012  Patient:  [name], [name]  Account Number:  [id number] Admit date:  01/21/2012  Clinical Social Worker:  [name], LCSW  Date/time:  01/31/2012 02:00 PM  Clinical Social Work is seeking post-discharge placement for this patient at the following level of care:   [place]   (*CSW will update this form in Epic as items are completed)   02/07/2012  Patient/family provided with [place] Department of Clinical Social Work's list of facilities offering this level of care within the geographic area requested by the patient (or if unable, by the patient's family).  02/07/2012  Patient/family informed of their freedom to choose among providers that offer the needed level of care, that participate in Medicare, Medicaid or managed care program needed by the patient, have an available bed and are willing to accept the patient.  02/07/2012  Patient/family informed of [facility]' ownership interest in [facility], as well as of the fact that they are under no obligation to receive care at this facility.  PASARR submitted to EDS on 01/31/2012 PASARR number received from EDS on 02/07/2012  FL2 transmitted to all facilities in geographic area requested by pt/family on  02/07/2012 FL2 transmitted to all facilities within larger geographic area on 02/07/2012  Patient informed that his/her managed care company has contracts with or will negotiate with  certain facilities, including the following:     Patient/family informed of bed offers received:  02/12/2012 Patient chooses bed at [facility] Physician recommends and patient chooses bed at  [facility]  Patient to be transferred to  on   Patient to be transferred to facility by   The following physician request were entered in Epic:   Additional Comments: [place] submitted PASARR 01-31-12.   [place] DSS interviewed patient, nurses and CSW.  Lucius Conn states she will initiate petition for emergency guardianship tomorrow AM due to patient disability.  [name], [place]

## 2012-02-12 NOTE — Progress Notes (Signed)
Patient ID: [name], male   DOB: 1960/11/06, 51 y.o.   MRN: [id number]

## 2012-02-12 NOTE — Progress Notes (Signed)
Subjective: Interval History: none.  Objective: Vital signs in last 24 hours: Temp:  [97.1 F (36.2 C)-99 F (37.2 C)] 97.1 F (36.2 C) (06/11 0800) Pulse Rate:  [74-98] 76  (06/11 0845) Resp:  [14-29] 21  (06/11 0845) BP: (113-183)/(57-89) 168/74 mmHg (06/11 0800) SpO2:  [89 %-100 %] 98 % (06/11 0845) Weight:  [71.9 kg (158 lb 8.2 oz)] 71.9 kg (158 lb 8.2 oz) (06/11 0500) Weight change: -4.7 kg (-10 lb 5.8 oz)  Intake/Output from previous day: 06/10 0701 - 06/11 0700 In: 3142 [I.V.:1810; NG/GT:920; IV Piggyback:412] Out: 4700 [Urine:4700] Intake/Output this shift: Total I/O In: 155 [I.V.:75; NG/GT:80] Out: -   General appearance: alert and no distress Resp: clear to auscultation bilaterally Cardio: regular rate and rhythm, S1, S2 normal, no murmur, click, rub or gallop GI: soft, non-tender; bowel sounds normal; no masses,  no organomegaly Extremities: extremities normal, atraumatic, no cyanosis or edema  Lab Results: No results found for this basename: WBC:2,HGB:2,HCT:2,PLT:2 in the last 72 hours BMET:  [facility] 02/12/12 0409 02/11/12 0518  NA 151* 146*  K 3.2* 2.7*  CL 104 99  CO2 38* 39*  GLUCOSE 144* 239*  BUN 89* 91*  CREATININE 2.21* 2.19*  CALCIUM 8.5 8.5   No results found for this basename: PTH:2 in the last 72 hours Iron Studies: No results found for this basename: IRON,TIBC,TRANSFERRIN,FERRITIN in the last 72 hours  Studies/Results: No results found.  I have reviewed the patient's current medications.  Assessment/Plan: Problem #1 acute kidney injury patient pending creatinine is stable. His none oliguric. Problem #2 hypokalemia his potassium supplement potassium is 3.2 improving Problem #3 history of hypernatremia most likely secondary to half normal saline with potassium supplement. Sodium at this moment is increasing. Problem #4 respiratory failure patient is extubated Problem #5 history of pneumonia he is a febrile white blood cell count slightly  high Problem #6 history of anemia Problem #7 history of mental retardation Problem #8 history of BPH. Recommendation we'll change his IV fluid to half normal saline with 20 of KCl at 100 cc per hour We'll increase his KCl per NG  To 40 mEq 3 times a day. We'll follow his basic metabolic panel.    LOS: 22 days   Orra Nolde S 02/12/2012,10:26 AM

## 2012-02-12 NOTE — Progress Notes (Signed)
NAME:  [name], [name]           ACCOUNT NO.:  [id number]  MEDICAL RECORD NO.:  [id number]  LOCATION:  IC03                          FACILITY:  [facility]  PHYSICIAN:  Keyana Guevara D. Legrand Rams, MD   DATE OF BIRTH:  1960/10/03  DATE OF PROCEDURE:  02/12/2012 DATE OF DISCHARGE:                                PROGRESS NOTE   SUBJECTIVE:  The patient is feeling better.  No new complaints.  OBJECTIVE:  GENERAL:  The patient is more alert, awake, and he is tolerating pureed diet.  His NGT was removed.  No new complaints. VITAL SIGNS :  Blood pressure 113/89, pulse 67, respiratory rate 14, temperature 97.7 degrees Fahrenheit. CHEST:  Decreased air entry, few rhonchi.  CARDIOVASCULAR:  First and second heart sound heard.  No murmur.  No gallop. ABDOMEN:  Soft and lax.  Bowel sound is positive.  No mass or organomegaly. EXTREMITIES:  1+ edema.  LABORATORY FINDINGS:  Sodium 151, potassium 3.2, chloride 104, carbon dioxide 38, glucose 114, BUN 89, creatinine 2.1, calcium 8.5.  ASSESSMENT: 1. Status post ventilatory-dependent respiratory failure. 2. Bilateral pneumonia. 3. Acute on chronic renal failure. 4. History of sepsis. 5. Diabetes mellitus.  PLAN:  Continue the patient on current medications.  Continue nutritional support.  We will do physical therapy.  We will continue regular medications.     Lukah Goswami D. Legrand Rams, MD     TDF/MEDQ  D:  02/12/2012  T:  02/12/2012  Job:  [id number]

## 2012-02-12 NOTE — Progress Notes (Signed)
[name], [name]           ACCOUNT NO.:  [id number]  MEDICAL RECORD NO.:  [id number]  LOCATION:  IC03                          FACILITY:  [facility]  PHYSICIAN:  Loni Beckwith, MD DATE OF BIRTH:  Jan 09, 1961  DATE OF PROCEDURE:  02/12/2012 DATE OF DISCHARGE:                                PROGRESS NOTE   FOLLOWUP:  Type 1 diabetes, which is uncontrolled.  SUBJECTIVE:  The patient is now off mechanical ventilation.  He is switched off tube feeding, currently on p.o. diet; however, does not take in adequate amount.  He had 1 episode of hypoglycemia over the last 24 hours.  His current glycemic range is between 90s and 200s.  OBJECTIVE:  GENERAL:  Patient is alert, awake, and responsive, no distress. VITAL SIGNS:  His blood pressure is 147/67, pulse rate is 90, temperature 97.1, respiratory rate is 18. HEENT:  Well hydrated. CHEST:  Diffuse rhonchi. CARDIOVASCULAR:  Normal S1, S2.  No murmur.  No gallop. ABDOMEN:  Soft.  Positive bowel sounds. EXTREMITIES:  No edema.  LABORATORY DATA:  His most recent labs from show sodium 151, potassium 3.2, chloride 104, bicarb 38, BUN 89, creatinine 2.1.  Blood glucose ranges from 90-200.  ASSESSMENT:  Type 1 diabetes, chronically uncontrolled, recent A1c was 8%.  Patient is status post respiratory failure, status post extubation; acute on chronic kidney disease; anemia; mental retardation.  PLAN:  Given his inability to take in adequate oral nutrition, I will add dextrose to his current IV fluids, DuoNeb 75 mL/h of D5 half-normal saline. I will decrease his NPH to 8 units every 8 hours, and continue with correction dose with NovoLog. I will re-evaluate in 24 hours and titrate insulin.          ______________________________ Loni Beckwith, MD     GN/MEDQ  D:  02/12/2012  T:  02/12/2012  Job:  [id number]

## 2012-02-12 NOTE — Plan of Care (Signed)
Problem: Phase I Progression Outcomes Goal: OOB as tolerated unless otherwise ordered Outcome: Progressing OOB with lift; consult to PT

## 2012-02-13 ENCOUNTER — Inpatient Hospital Stay (HOSPITAL_COMMUNITY): Payer: Medicaid Other

## 2012-02-13 LAB — CBC
HCT: 29.6 % — ABNORMAL LOW (ref 39.0–52.0)
MCV: 85.8 fL (ref 78.0–100.0)
RBC: 3.45 MIL/uL — ABNORMAL LOW (ref 4.22–5.81)
WBC: 23.5 10*3/uL — ABNORMAL HIGH (ref 4.0–10.5)

## 2012-02-13 LAB — BASIC METABOLIC PANEL
BUN: 66 mg/dL — ABNORMAL HIGH (ref 6–23)
CO2: 36 mEq/L — ABNORMAL HIGH (ref 19–32)
CO2: 31 mEq/L (ref 19–32)
Calcium: 8.5 mg/dL (ref 8.4–10.5)
Chloride: 110 mEq/L (ref 96–112)
Chloride: 110 mEq/L (ref 96–112)
Creatinine, Ser: 2.05 mg/dL — ABNORMAL HIGH (ref 0.50–1.35)
GFR calc Af Amer: 42 mL/min — ABNORMAL LOW (ref >90)
GFR calc non Af Amer: 36 mL/min — ABNORMAL LOW (ref >90)
Glucose, Bld: 155 mg/dL — ABNORMAL HIGH (ref 70–99)
Glucose, Bld: 229 mg/dL — ABNORMAL HIGH (ref 70–99)
Potassium: 4.5 mEq/L (ref 3.5–5.1)
Sodium: 154 mEq/L — ABNORMAL HIGH (ref 135–145)
Sodium: 150 mEq/L — ABNORMAL HIGH (ref 135–145)

## 2012-02-13 LAB — GLUCOSE, CAPILLARY
Glucose-Capillary: 140 mg/dL — ABNORMAL HIGH (ref 70–99)
Glucose-Capillary: 153 mg/dL — ABNORMAL HIGH (ref 70–99)
Glucose-Capillary: 39 mg/dL — CL (ref 70–99)

## 2012-02-13 MED ORDER — SERTRALINE HCL 50 MG PO TABS
150.0000 mg | ORAL_TABLET | Freq: Every day | ORAL | Status: DC
  Administered 2012-02-13 – 2012-02-23 (×9): 150 mg via ORAL
  Filled 2012-02-13 (×2): qty 3
  Filled 2012-02-13: qty 1
  Filled 2012-02-13: qty 3
  Filled 2012-02-13: qty 2
  Filled 2012-02-13 (×5): qty 3

## 2012-02-13 MED ORDER — SODIUM CHLORIDE 0.45 % IV BOLUS
500.0000 mL | Freq: Once | INTRAVENOUS | Status: AC
  Administered 2012-02-13: 500 mL via INTRAVENOUS

## 2012-02-13 MED ORDER — PANTOPRAZOLE SODIUM 40 MG PO TBEC
40.0000 mg | DELAYED_RELEASE_TABLET | Freq: Every day | ORAL | Status: DC
  Administered 2012-02-13 – 2012-02-19 (×7): 40 mg via ORAL
  Filled 2012-02-13 (×5): qty 1

## 2012-02-13 MED ORDER — SODIUM CHLORIDE 0.45 % IV SOLN
INTRAVENOUS | Status: DC
  Administered 2012-02-13 – 2012-02-14 (×2): via INTRAVENOUS

## 2012-02-13 NOTE — Progress Notes (Signed)
NAMEKELI, LOY           ACCOUNT NO.:  [id number]  MEDICAL RECORD NO.:  [id number]  LOCATION:  [id number]                          FACILITY:  [facility]  PHYSICIAN:  Loni Beckwith, MD DATE OF BIRTH:  21-Sep-1960  DATE OF PROCEDURE:  02/13/2012 DATE OF DISCHARGE:                                PROGRESS NOTE   REASON FOR FOLLOW UP:  Type 1 diabetes, which is uncontrolled.  SUBJECTIVE:  The patient is now on regular floor, on pureed diet.  This is 24 hours on these kind of feeding swishing of tube feeding.  He tolerates very minimal amount at this point.  No hypoglycemia.  He is on NPH 5 units every 8 hours and NovoLog for coverage.  OBJECTIVE:  GENERAL:  The patient is not in acute distress.  He is aphasic, responds to painful and voices stimulus. VITAL SIGNS:  Blood pressure is 146/63, pulse rate 83, temperature 99, respiratory rate 20. HEENT:  Well hydrated. NECK:  Negative for JVD.  No thyromegaly. CHEST:  Significant for diffuse rhonchi. CARDIOVASCULAR:  Distant heart sounds.  No murmur.  No gallop. ABDOMEN:  Positive bowel sounds.  No signs of ascites.  No organomegaly. EXTREMITIES:  No edema.  LABORATORY DATA:  This morning shows sodium elevated at 154, potassium normal at 3.7.  His bedside blood glucose ranged from 88-155 on current insulin doses.  ASSESSMENT: 1. Type 1 diabetes, chronically uncontrolled.  Most recent A1c was 8%. 2. Status post respiratory failure, post extubation. 3. Acute on chronic kidney disease. 4. Anemia. 5. Mental retardation.  PLAN:  Given his inability to adequately take oral nutrition, he will be continued on dextrose 5% and half normal saline at 75 mL/h.  Given his hypernatremia, I will add bolus of half normal saline 500 x1, and this will be followed by 75 mL/h with strict monitoring of I's and O's.  We will continue with NPH 5 units every 8 hours, and NovoLog for correction dose.  He will need fingerstick every 4 hours to titrate  insulin.  I will continue to see him inhouse as well as an outpatient if needed.          ______________________________ Loni Beckwith, MD     GN/MEDQ  D:  02/13/2012  T:  02/13/2012  Job:  [id number]

## 2012-02-13 NOTE — Progress Notes (Signed)
Patient ID: [name], male   DOB: 07-May-1961, 51 y.o.   MRN: [id number] [phone]

## 2012-02-13 NOTE — Progress Notes (Signed)
UR chart review completed.  

## 2012-02-13 NOTE — Progress Notes (Signed)
Subjective: He is overall about the same. He looks around but he does not communicate right now. He has done pretty well with his ability to eat.  Objective: Vital signs in last 24 hours: Temp:  [97.1 F (36.2 C)-98.9 F (37.2 C)] 98.2 F (36.8 C) (06/12 0400) Pulse Rate:  [76-103] 87  (06/12 0500) Resp:  [18-29] 22  (06/12 0600) BP: (143-171)/(58-93) 159/73 mmHg (06/12 0600) SpO2:  [91 %-100 %] 94 % (06/12 0500) Weight change:  Last BM Date: 02/12/12  Intake/Output from previous day: 06/11 0701 - 06/12 0700 In: 2255 [P.O.:180; I.V.:1735; NG/GT:340] Out: 3201 [Urine:3200; Stool:1]  PHYSICAL EXAM General appearance: no distress Resp: rhonchi bilaterally Cardio: regular rate and rhythm, S1, S2 normal, no murmur, click, rub or gallop GI: soft, non-tender; bowel sounds normal; no masses,  no organomegaly Extremities: extremities normal, atraumatic, no cyanosis or edema  Lab Results:    Basic Metabolic Panel:  Basename 02/13/12 0431 02/12/12 0409  NA 154* 151*  K 3.7 3.2*  CL 110 104  CO2 36* 38*  GLUCOSE 155* 144*  BUN 78* 89*  CREATININE 2.23* 2.21*  CALCIUM 8.7 8.5  MG -- --  PHOS -- --   Liver Function Tests: No results found for this basename: AST:2,ALT:2,ALKPHOS:2,BILITOT:2,PROT:2,ALBUMIN:2 in the last 72 hours No results found for this basename: LIPASE:2,AMYLASE:2 in the last 72 hours No results found for this basename: AMMONIA:2 in the last 72 hours CBC:  Basename 02/13/12 0431  WBC 23.5*  NEUTROABS --  HGB 9.2*  HCT 29.6*  MCV 85.8  PLT 259   Cardiac Enzymes: No results found for this basename: CKTOTAL:3,CKMB:3,CKMBINDEX:3,TROPONINI:3 in the last 72 hours BNP: No results found for this basename: PROBNP:3 in the last 72 hours D-Dimer: No results found for this basename: DDIMER:2 in the last 72 hours CBG:  Basename 02/13/12 0737 02/13/12 0355 02/12/12 2328 02/12/12 1932 02/12/12 1530 02/12/12 1128  GLUCAP 124* 140* 80 325* 148* 294*    Hemoglobin A1C: No results found for this basename: HGBA1C in the last 72 hours Fasting Lipid Panel: No results found for this basename: CHOL,HDL,LDLCALC,TRIG,CHOLHDL,LDLDIRECT in the last 72 hours Thyroid Function Tests: No results found for this basename: TSH,T4TOTAL,FREET4,T3FREE,THYROIDAB in the last 72 hours Anemia Panel: No results found for this basename: VITAMINB12,FOLATE,FERRITIN,TIBC,IRON,RETICCTPCT in the last 72 hours Coagulation: No results found for this basename: LABPROT:2,INR:2 in the last 72 hours Urine Drug Screen: Drugs of Abuse  No results found for this basename: labopia, cocainscrnur, labbenz, amphetmu, thcu, labbarb    Alcohol Level: No results found for this basename: ETH:2 in the last 72 hours Urinalysis: No results found for this basename: COLORURINE:2,APPERANCEUR:2,LABSPEC:2,PHURINE:2,GLUCOSEU:2,HGBUR:2,BILIRUBINUR:2,KETONESUR:2,PROTEINUR:2,UROBILINOGEN:2,NITRITE:2,LEUKOCYTESUR:2 in the last 72 hours Misc. Labs:  ABGS  Basename 02/11/12 0507  PHART 7.511*  PO2ART 91.1  TCO2 36.8  HCO3 39.7*   CULTURES No results found for this or any previous visit (from the past 240 hour(s)). Studies/Results: No results found.  Medications:  Scheduled:   . albuterol  2.5 mg Nebulization Q4H   And  . ipratropium  0.5 mg Nebulization Q4H  . amLODipine  5 mg Oral Daily  . antiseptic oral rinse  15 mL Mouth Rinse QID  . chlorhexidine  15 mL Mouth Rinse BID  . enoxaparin (LOVENOX) injection  40 mg Subcutaneous Q24H  . famotidine  20 mg Oral Daily  . insulin aspart  0-15 Units Subcutaneous Q4H  . insulin NPH  5 Units Subcutaneous Q8H  . lisinopril  10 mg Oral Daily  . pantoprazole sodium  40 mg  Per Tube Q1200  . polyethylene glycol  17 g Oral q morning - 10a  . potassium chloride  40 mEq Oral TID  . sodium chloride  10-40 mL Intracatheter Q12H  . Tamsulosin HCl  0.4 mg Oral Daily  . traZODone  50 mg Oral QHS  . ziprasidone  40 mg Oral BID WC  . DISCONTD:  hydrocortisone sodium succinate  50 mg Intravenous Q12H  . DISCONTD: insulin NPH  8 Units Subcutaneous Q8H  . DISCONTD: potassium chloride  40 mEq Oral BID  . DISCONTD: potassium chloride  40 mEq Oral TID   Continuous:   . dextrose 5 % and 0.45 % NaCl with KCl 20 mEq/L 75 mL/hr at 02/13/12 0600  . DISCONTD: feeding supplement (NEPRO CARB STEADY) 1,000 mL (02/09/12 0803)  . DISCONTD: sodium chloride 0.45 % with kcl 100 mL/hr at 02/12/12 1500  . DISCONTD: sodium chloride 0.45 % with kcl 75 mL/hr at 02/12/12 1000   [id number], albuterol, ALPRAZolam, alum & mag hydroxide-simeth, dextrose, dextrose, hyoscyamine, LORazepam, morphine injection, ondansetron, sodium chloride  Assesment: He was admitted with respiratory failure, sepsis had diabetes it was out of control and acute on chronic renal failure. He has been able to come off the ventilator. He does not appear that he is going to require feeding tube placement at this point. He is still not back to baseline as far as his mental status is concerned but he is better Active Problems:  Acute renal failure  Pneumonia  Sepsis  Mental retardation  HCAP (healthcare-associated pneumonia)  SIRS (systemic inflammatory response syndrome)  Respiratory failure  Diabetes mellitus out of control    Plan: Continue current treatments he does have an elevated white blood cell count and he saw his IV antibiotics but I would continue with that un less we have some other source of infection    LOS: 23 days   Amalea Ottey L 02/13/2012, 7:53 AM

## 2012-02-13 NOTE — Progress Notes (Signed)
NAME:  [name], [name]           ACCOUNT NO.:  [id number]  MEDICAL RECORD NO.:  [id number]  LOCATION:  IC03                          FACILITY:  [facility]  PHYSICIAN:  Epic Tribbett D. Legrand Rams, MD   DATE OF BIRTH:  Jul 18, 1961  DATE OF PROCEDURE:  02/13/2012 DATE OF DISCHARGE:                                PROGRESS NOTE   SUBJECTIVE:  The patient is not communicating verbally, unable to give any history.  OBJECTIVE:  GENERAL:  He is just opening his eyes.  No fever or chills. The patient is being tried on pureed diet.  He remained very weak and deconditioned. VITAL SIGNS:  Blood pressure 143/70, pulse 76, respiratory rate 16, temperature 97.1 degrees Fahrenheit. CHEST:  Decreased air entry, few rhonchi. CARDIOVASCULAR SYSTEM:  First and second heart sounds heard.  No murmur. No gallop. ABDOMEN:  Soft and lax.  Bowel sound is positive.  No mass or organomegaly. EXTREMITIES:  No leg edema.  LABORATORY DATA:  BMP:  Sodium 154, potassium 3.7, chloride 110, carbon dioxide 136, glucose 155, BUN 78, creatinine 2.3, calcium 8.7.  ASSESSMENT: 1. Encephalopathy secondary to multifactorial causes. 2. History of respiratory failure. 3. Bilateral pneumonia. 4. History of sepsis. 5. Acute on chronic renal failure. 6. Diabetes mellitus. 7. Mental retardation. 8. Dysphagia.  PLAN:  We will continue the patient on current medications.  We will continue to monitor his renal function.  The patient will continue on nutritional support and supportive care.  Overall the patient is still unstable and very weak.     Caliph Borowiak D. Legrand Rams, MD    TDF/MEDQ  D:  02/13/2012  T:  02/13/2012  Job:  [id number]

## 2012-02-13 NOTE — Progress Notes (Signed)
[name]  MRN: [id number]  DOB/AGE: October 24, 1960 51 y.o.  Primary Care Physician:FANTA,TESFAYE, MD  Admit date: 01/21/2012  Chief Complaint:  Chief Complaint  Patient presents with  . Hypoglycemia    S-Pt presented on  01/21/2012 with  Chief Complaint  Patient presents with  . Hypoglycemia  . Pt non verbal.Pt offers NO complaints.  Meds    . albuterol  2.5 mg Nebulization Q4H   And  . ipratropium  0.5 mg Nebulization Q4H  . amLODipine  5 mg Oral Daily  . antiseptic oral rinse  15 mL Mouth Rinse QID  . chlorhexidine  15 mL Mouth Rinse BID  . enoxaparin (LOVENOX) injection  40 mg Subcutaneous Q24H  . famotidine  20 mg Oral Daily  . insulin aspart  0-15 Units Subcutaneous Q4H  . insulin NPH  5 Units Subcutaneous Q8H  . lisinopril  10 mg Oral Daily  . pantoprazole sodium  40 mg Per Tube Q1200  . polyethylene glycol  17 g Oral q morning - 10a  . potassium chloride  40 mEq Oral TID  . sodium chloride  10-40 mL Intracatheter Q12H  . Tamsulosin HCl  0.4 mg Oral Daily  . traZODone  50 mg Oral QHS  . ziprasidone  40 mg Oral BID WC  . DISCONTD: insulin NPH  8 Units Subcutaneous Q8H  . DISCONTD: potassium chloride  40 mEq Oral BID  . DISCONTD: potassium chloride  40 mEq Oral TID          Physical Exam: Vital signs in last 24 hours: Temp:  [97.8 F (36.6 C)-98.9 F (37.2 C)] 98.8 F (37.1 C) (06/12 0800) Pulse Rate:  [77-103] 82  (06/12 0800) Resp:  [18-31] 31  (06/12 0800) BP: (143-171)/(58-93) 151/72 mmHg (06/12 0800) SpO2:  [91 %-100 %] 94 % (06/12 0800) Weight change:  Last BM Date: 02/12/12  Intake/Output from previous day: 06/11 0701 - 06/12 0700 In: 2255 [P.O.:180; I.V.:1735; NG/GT:340] Out: 3201 [Urine:3200; Stool:1]     Physical Exam: General appearance: no distress  Resp: rhonchi bilaterally  Cardio: regular rate and rhythm, S1, S2 normal, no murmur, click, rub or gallop  GI: soft, non-tender; bowel sounds normal; no masses, no  organomegaly  Extremities: extremities normal, atraumatic, no cyanosis or edema     Lab Results: CBC  Basename 02/13/12 0431  WBC 23.5*  HGB 9.2*  HCT 29.6*  PLT 259    BMET  Basename 02/13/12 0431 02/12/12 0409  NA 154* 151*  K 3.7 3.2*  CL 110 104  CO2 36* 38*  GLUCOSE 155* 144*  BUN 78* 89*  CREATININE 2.23* 2.21*  CALCIUM 8.7 8.5   Creat  5/20 5/26  2013 1.7==>2.35==>2.65==>1.92==>19.2==>1.42==>1.34  5/26       5/29       5/29       02/05/12    02/06/12     02/13/12 1.34==> 2.85==>3.19 ==>2.55 ==>2.33==>2.23    2012 1.2--2.2  2011 1.0---2.9   Sodium 144==>146==>151==>154    Lab Results  Component Value Date   CALCIUM 8.7 02/13/2012   CAION 1.25 12/04/2011   PHOS 4.9* 02/09/2012        Impression: 1)Renal  AKI secondary to Prerenal/ ATN  AKI secondary to Hypotension+ sepsis + ACE  AKI on CKD  AKI- NON OLiguric ATN  Creat now stable at 2.2  CKD stage 3 .  CKD since 2011  CKD secondary to HTN/ DM  Progression of CKD -Marked with AKI  2)CVS- Hemodynamically stable    3)Anemia HGb NOT at goal (9--11)  Iron deficincey as Iron less than 10 on 5/24  GI work up-as per Primary team   4)ID Pneumonia  ON ABX  5)DM- Uncontrolled  Being managed by Primary team /Endo.   6)Hypernatremia-  Most likley secondary to inadequate Free water intake + Polyuric phase of ATN Urine output decreasing. Pt earlier had HypoNatremia secondary to Hypovolemia + SSRI   7)Acid base  Co2 at goal   8) CNS- Metabolic Encephalopathy   Plan:  Will increase IVF rate. Will follow [facility] in evening.     Deshayla Empson S 02/13/2012, 8:51 AM

## 2012-02-14 LAB — GLUCOSE, CAPILLARY
Glucose-Capillary: 141 mg/dL — ABNORMAL HIGH (ref 70–99)
Glucose-Capillary: 141 mg/dL — ABNORMAL HIGH (ref 70–99)

## 2012-02-14 LAB — BASIC METABOLIC PANEL
Chloride: 111 mEq/L (ref 96–112)
Creatinine, Ser: 2.06 mg/dL — ABNORMAL HIGH (ref 0.50–1.35)
GFR calc Af Amer: 42 mL/min — ABNORMAL LOW (ref >90)

## 2012-02-14 MED ORDER — DEXTROSE 50 % IV SOLN
25.0000 mL | Freq: Once | INTRAVENOUS | Status: AC | PRN
  Administered 2012-02-14: 25 mL via INTRAVENOUS

## 2012-02-14 MED ORDER — POTASSIUM CL IN DEXTROSE 5% 20 MEQ/L IV SOLN
20.0000 meq | INTRAVENOUS | Status: DC
  Administered 2012-02-14: 20 meq via INTRAVENOUS

## 2012-02-14 MED ORDER — ENOXAPARIN SODIUM 30 MG/0.3ML ~~LOC~~ SOLN
30.0000 mg | SUBCUTANEOUS | Status: DC
  Administered 2012-02-14 – 2012-02-18 (×5): 30 mg via SUBCUTANEOUS
  Filled 2012-02-14 (×5): qty 0.3

## 2012-02-14 MED ORDER — DEXTROSE 50 % IV SOLN
INTRAVENOUS | Status: AC
  Administered 2012-02-14: 25 mL via INTRAVENOUS
  Filled 2012-02-14: qty 50

## 2012-02-14 NOTE — Progress Notes (Signed)
[name], [name]           ACCOUNT NO.:  [id number]  MEDICAL RECORD NO.:  [id number]  LOCATION:  [id number]                          FACILITY:  [facility]  PHYSICIAN:  Zyrah Wiswell D. Legrand Rams, MD   DATE OF BIRTH:  1960/12/18  DATE OF PROCEDURE:  02/14/2012 DATE OF DISCHARGE:                                PROGRESS NOTE   SUBJECTIVE:  The patient is unable to give history.  Not much change in his overall condition.  OBJECTIVE:  GENERAL:  The patient is awake, but acutely sick looking and has a labored breathing.  No fever or chills. VITAL SIGNS:  Blood pressure 118/64, pulse 82, respiratory rate 18, temperature 98.2 degrees Fahrenheit. CHEST:  Poor air entry, bilateral rhonchi and crackles at the base. CARDIOVASCULAR SYSTEM:  First and second heart sounds heard.  No murmur. No gallop. ABDOMEN:  Soft and lax.  Bowel sound is positive.  No mass or organomegaly. EXTREMITIES:  No leg edema.  LABORATORY DATA:  BMP:  Sodium 150, potassium 4.4, chloride 111, carbon dioxide 30, glucose 231 BUN 54, creatinine 2.06, calcium 8.3.  ASSESSMENT: 1. Status post ventilatory dependent respiratory failure. 2. Ongoing bilateral pneumonia. 3. Sepsis currently treated. 4. Dysphagia. 5. Hypernatremia. 6. Acute on chronic renal failure. 7. Poorly controlled diabetes mellitus type 1 with episode of     hypoglycemia. 8. Anemia. 9. Mental retardation.  PLAN:  We will continue the patient under telemetry.  We will continue IV fluid according to Nephrology and Endocrine recommendation.  We will try nutritional support.  Continue current medications.  The patient has completed course of IV antibiotics.  His condition remained still unstable.  The patient is not ready to be transferred to nursing home due to multiple medical illnesses that is actively being treated.  We will continue to monitor his CBC, BMP.     Yahel Fuston D. Legrand Rams, MD     TDF/MEDQ  D:  02/14/2012  T:  02/14/2012  Job:  [id number]

## 2012-02-14 NOTE — Progress Notes (Signed)
Patient ID: SHIVANG MCRIGHT, male   DOB: [birth year], 51 y.o.   MRN: [id number] [phone]

## 2012-02-14 NOTE — Progress Notes (Signed)
CBG: 39 at 02/13/12 2353  Treatment: D50 IV 25 mL  Symptoms: None  Follow-up CBG: Time:0011 CBG Result:101  Possible Reasons for Event: Inadequate meal intake  Comments/MD notified:    Felecia Jan

## 2012-02-14 NOTE — Progress Notes (Signed)
Subjective: He is generally improving. He has moved to a regular room. He is eating and requires prompting to swallow but he is doing better. He does not appear to be dyspneic now.  Objective: Vital signs in last 24 hours: Temp:  [98.1 F (36.7 C)-99.6 F (37.6 C)] 99.6 F (37.6 C) (06/13 0539) Pulse Rate:  [83-88] 83  (06/13 0539) Resp:  [18-26] 22  (06/13 0539) BP: (118-177)/(63-94) 139/73 mmHg (06/13 0539) SpO2:  [90 %-98 %] 95 % (06/13 0803) Weight:  [68.493 kg (151 lb)] 68.493 kg (151 lb) (06/13 0539) Weight change:  Last BM Date: 02/13/12  Intake/Output from previous day: 06/12 0701 - 06/13 0700 In: 2471.3 [P.O.:150; I.V.:1721.3; IV Piggyback:500] Out: 4525 [Urine:4525]  PHYSICAL EXAM General appearance: no distress Resp: rhonchi bilaterally Cardio: regular rate and rhythm, S1, S2 normal, no murmur, click, rub or gallop GI: soft, non-tender; bowel sounds normal; no masses,  no organomegaly Extremities: extremities normal, atraumatic, no cyanosis or edema  Lab Results:    Basic Metabolic Panel:  Basename 02/14/12 0606 02/13/12 1904  NA 150* 150*  K 4.4 4.5  CL 111 110  CO2 30 31  GLUCOSE 231* 229*  BUN 54* 66*  CREATININE 2.06* 2.05*  CALCIUM 8.3* 8.5  MG -- --  PHOS -- --   Liver Function Tests: No results found for this basename: AST:2,ALT:2,ALKPHOS:2,BILITOT:2,PROT:2,ALBUMIN:2 in the last 72 hours No results found for this basename: LIPASE:2,AMYLASE:2 in the last 72 hours No results found for this basename: AMMONIA:2 in the last 72 hours CBC:  Basename 02/13/12 0431  WBC 23.5*  NEUTROABS --  HGB 9.2*  HCT 29.6*  MCV 85.8  PLT 259   Cardiac Enzymes: No results found for this basename: CKTOTAL:3,CKMB:3,CKMBINDEX:3,TROPONINI:3 in the last 72 hours BNP: No results found for this basename: PROBNP:3 in the last 72 hours D-Dimer: No results found for this basename: DDIMER:2 in the last 72 hours CBG:  Basename 02/14/12 0716 02/14/12 0402 02/14/12  0237 02/14/12 0011 02/13/12 2353 02/13/12 2015  GLUCAP 233* 141* 101* 101* 39* 153*   Hemoglobin A1C: No results found for this basename: HGBA1C in the last 72 hours Fasting Lipid Panel: No results found for this basename: CHOL,HDL,LDLCALC,TRIG,CHOLHDL,LDLDIRECT in the last 72 hours Thyroid Function Tests: No results found for this basename: TSH,T4TOTAL,FREET4,T3FREE,THYROIDAB in the last 72 hours Anemia Panel: No results found for this basename: VITAMINB12,FOLATE,FERRITIN,TIBC,IRON,RETICCTPCT in the last 72 hours Coagulation: No results found for this basename: LABPROT:2,INR:2 in the last 72 hours Urine Drug Screen: Drugs of Abuse  No results found for this basename: labopia, cocainscrnur, labbenz, amphetmu, thcu, labbarb    Alcohol Level: No results found for this basename: ETH:2 in the last 72 hours Urinalysis: No results found for this basename: COLORURINE:2,APPERANCEUR:2,LABSPEC:2,PHURINE:2,GLUCOSEU:2,HGBUR:2,BILIRUBINUR:2,KETONESUR:2,PROTEINUR:2,UROBILINOGEN:2,NITRITE:2,LEUKOCYTESUR:2 in the last 72 hours Misc. Labs:  ABGS No results found for this basename: PHART,PCO2,PO2ART,TCO2,HCO3 in the last 72 hours CULTURES No results found for this or any previous visit (from the past 240 hour(s)). Studies/Results: Dg Swallowing Function  02/13/2012  *RADIOLOGY REPORT*  Clinical Data: Dysphagia  SWALLOWING FUNCTION STUDY:  Technique:  Patient swallowed varying consistencies of barium with the assistance of a speech pathologist.  I performed real-time fluoroscopic assessment in the lateral projection.  Fluoroscopy time:  3.6 minutes  Comparison:  None  Findings: With applesauce consistency, slight delayed initiation of swallow was seen.  Spillover of contrast to vallecula.  No laryngeal penetration or aspiration.  Vallecular and piriform sinus residuals were identified as well as hypopharyngeal residuals.  The patient's indwelling nasogastric  tube was then removed by the speech pathologist.   With nectar consistency, initial swallow demonstrated no laryngeal penetration or aspiration though vallecular residuals were noted. Subsequent swallows of nectar consistency demonstrated laryngeal penetration without aspiration.  Vallecular and less piriform sinus residuals were consistently visualized.  With honey consistency, spillover of contrast to the vallecula occurred with delayed initiation.  No laryngeal penetration or aspiration.  Vallecular residuals noted on multiple swallows. Similar findings were seen with honey consistency by cup.  IMPRESSION: Swallowing dysfunction as above.  Original Report Authenticated By: Burnetta Sabin, M.D.   Dg Chest Port 1 View  02/13/2012  *RADIOLOGY REPORT*  Clinical Data: Pneumonia  PORTABLE CHEST - 1 VIEW  Comparison: 02/10/2012; 02/09/2012; 02/08/2012  Findings:  Grossly unchanged cardiac silhouette and mediastinal contours. Interval extubation and removal of enteric tube.  Stable positioning of left upper extremity approach PICC line with tip overlying the superior cavoatrial junction. Grossly unchanged heterogeneous possible air space opacities within the left mid lung. Ill-defined nodular opacities overlying the right mid/upper lung are unchanged.  No new focal airspace opacities. Possible trace bilateral effusions grossly unchanged.  Unchanged bones.  IMPRESSION: 1.  Interval extubation and removal of enteric tube.  Stable positioning of remaining support apparatus.  No pneumothorax. 2.  Grossly unchanged bilateral air space opacities worrisome for multifocal infection.  Follow-up to resolution is recommended.  Original Report Authenticated By: Rachel Moulds, M.D.    Medications:  Scheduled:   . albuterol  2.5 mg Nebulization Q4H   And  . ipratropium  0.5 mg Nebulization Q4H  . amLODipine  5 mg Oral Daily  . antiseptic oral rinse  15 mL Mouth Rinse QID  . chlorhexidine  15 mL Mouth Rinse BID  . enoxaparin (LOVENOX) injection  30 mg Subcutaneous Q24H   . famotidine  20 mg Oral Daily  . insulin aspart  0-15 Units Subcutaneous Q4H  . insulin NPH  5 Units Subcutaneous Q8H  . lisinopril  10 mg Oral Daily  . pantoprazole  40 mg Oral Q1200  . polyethylene glycol  17 g Oral q morning - 10a  . sertraline  150 mg Oral Daily  . sodium chloride  500 mL Intravenous Once  . sodium chloride  10-40 mL Intracatheter Q12H  . Tamsulosin HCl  0.4 mg Oral Daily  . traZODone  50 mg Oral QHS  . ziprasidone  40 mg Oral BID WC  . DISCONTD: enoxaparin (LOVENOX) injection  40 mg Subcutaneous Q24H  . DISCONTD: pantoprazole sodium  40 mg Per Tube Q1200  . DISCONTD: potassium chloride  40 mEq Oral TID   Continuous:   . dextrose 5 % with KCl 20 mEq / L 20 mEq (02/14/12 0836)  . DISCONTD: sodium chloride 75 mL/hr at 02/14/12 0015  . DISCONTD: dextrose 5 % and 0.45 % NaCl with KCl 20 mEq/L 100 mL/hr at 02/13/12 1500   [id number], albuterol, ALPRAZolam, alum & mag hydroxide-simeth, dextrose, dextrose, dextrose, hyoscyamine, LORazepam, morphine injection, ondansetron, sodium chloride  Assesment: He was admitted with respiratory failure due to healthcare associated pneumonia. He is better. He had some difficulty with swallowing but is able to manage some food now with help. Active Problems:  Acute renal failure  Pneumonia  Sepsis  Mental retardation  HCAP (healthcare-associated pneumonia)  SIRS (systemic inflammatory response syndrome)  Respiratory failure  Diabetes mellitus out of control    Plan: No change in treatments. Continue to follow his chest x-ray and exam regarding the bilateral pneumonia  LOS: 24 days   [name] L 02/14/2012, 8:44 AM

## 2012-02-14 NOTE — Progress Notes (Signed)
[name], EDSELL           ACCOUNT NO.:  [id number]  MEDICAL RECORD NO.:  [id number]  LOCATION:  [id number]                          FACILITY:  [facility]  PHYSICIAN:  Loni Beckwith, MD DATE OF BIRTH:  Jan 26, 1961  DATE OF PROCEDURE:  02/14/2012 DATE OF DISCHARGE:                                PROGRESS NOTE   REASON FOR FOLLOW UP:  Uncontrolled type 1 diabetes.  SUBJECTIVE:  The patient is on pureed diet, had 1 mild hypoglycemia episode, tolerates increasing amount of oral diet, remains on dextrose support.  He is on NPH 5 units every 8 hours and sliding scale coverage with NovoLog.  OBJECTIVE:  GENERAL:  The patient is sleepy but arousable, not in acute distress. VITAL SIGNS:  Blood pressure is 153/71, pulse rate is 78, temperature 97.9. HEENT:  Well hydrated. NECK:  Negative for JVD. CHEST:  Significant for diffuse rhonchi. CARDIOVASCULAR:  Distant heart sounds.  No murmur.  No gallop. ABDOMEN:  Positive for bowel sounds.  No signs of ascites.  No organomegaly. EXTREMITIES:  No edema.  LABS:  Showed improved sodium of 150, potassium 4.4.  His glycemic range is from 140-230 today.  ASSESSMENT: 1. Type 1 diabetes, chronically uncontrolled.  Most recent A1c 8%. 2. Respiratory failure, status post mechanical ventilation and now     postextubation. 3. Acute on chronic kidney disease. 4. Anemia. 5. Mental retardation.  PLAN:  Continue NPH 5 units every 8 hours and NovoLog for correction dose.  Continue dextrose 5% half-normal saline at 75 mL/h.  He will need continued hydration with oral fluids as tolerated.  If the patient cannot take oral fluids in adequate amounts, we may initiate half normal saline at 75 mL/h.  We will repeat his BMP for tomorrow morning. Continue fingerstick every 4 hours for blood glucose.          ______________________________ Loni Beckwith, MD     GN/MEDQ  D:  02/14/2012  T:  02/14/2012  Job:  [id number]

## 2012-02-14 NOTE — Progress Notes (Signed)
Subjective: Interval History: none.  Objective: Vital signs in last 24 hours: Temp:  [98.1 F (36.7 C)-99.6 F (37.6 C)] 99.6 F (37.6 C) (06/13 0539) Pulse Rate:  [83-88] 83  (06/13 0539) Resp:  [18-26] 22  (06/13 0539) BP: (118-177)/(63-94) 139/73 mmHg (06/13 0539) SpO2:  [90 %-98 %] 95 % (06/13 0803) Weight:  [68.493 kg (151 lb)] 68.493 kg (151 lb) (06/13 0539) Weight change:   Intake/Output from previous day: 06/12 0701 - 06/13 0700 In: 2471.3 [P.O.:150; I.V.:1721.3; IV Piggyback:500] Out: 4525 [Urine:4525] Intake/Output this shift:    General appearance: alert Resp: clear to auscultation bilaterally Cardio: regular rate and rhythm, S1, S2 normal, no murmur, click, rub or gallop GI: soft, non-tender; bowel sounds normal; no masses,  no organomegaly Extremities: extremities normal, atraumatic, no cyanosis or edema  Lab Results:  Basename 02/13/12 0431  WBC 23.5*  HGB 9.2*  HCT 29.6*  PLT 259   BMET:  Basename 02/14/12 0606 02/13/12 1904  NA 150* 150*  K 4.4 4.5  CL 111 110  CO2 30 31  GLUCOSE 231* 229*  BUN 54* 66*  CREATININE 2.06* 2.05*  CALCIUM 8.3* 8.5   No results found for this basename: PTH:2 in the last 72 hours Iron Studies: No results found for this basename: IRON,TIBC,TRANSFERRIN,FERRITIN in the last 72 hours  Studies/Results: Dg Swallowing Function  02/13/2012  *RADIOLOGY REPORT*  Clinical Data: Dysphagia  SWALLOWING FUNCTION STUDY:  Technique:  Patient swallowed varying consistencies of barium with the assistance of a speech pathologist.  I performed real-time fluoroscopic assessment in the lateral projection.  Fluoroscopy time:  3.6 minutes  Comparison:  None  Findings: With applesauce consistency, slight delayed initiation of swallow was seen.  Spillover of contrast to vallecula.  No laryngeal penetration or aspiration.  Vallecular and piriform sinus residuals were identified as well as hypopharyngeal residuals.  The patient's indwelling  nasogastric tube was then removed by the speech pathologist.  With nectar consistency, initial swallow demonstrated no laryngeal penetration or aspiration though vallecular residuals were noted. Subsequent swallows of nectar consistency demonstrated laryngeal penetration without aspiration.  Vallecular and less piriform sinus residuals were consistently visualized.  With honey consistency, spillover of contrast to the vallecula occurred with delayed initiation.  No laryngeal penetration or aspiration.  Vallecular residuals noted on multiple swallows. Similar findings were seen with honey consistency by cup.  IMPRESSION: Swallowing dysfunction as above.  Original Report Authenticated By: Burnetta Sabin, M.D.   Dg Chest Port 1 View  02/13/2012  *RADIOLOGY REPORT*  Clinical Data: Pneumonia  PORTABLE CHEST - 1 VIEW  Comparison: 02/10/2012; 02/09/2012; 02/08/2012  Findings:  Grossly unchanged cardiac silhouette and mediastinal contours. Interval extubation and removal of enteric tube.  Stable positioning of left upper extremity approach PICC line with tip overlying the superior cavoatrial junction. Grossly unchanged heterogeneous possible air space opacities within the left mid lung. Ill-defined nodular opacities overlying the right mid/upper lung are unchanged.  No new focal airspace opacities. Possible trace bilateral effusions grossly unchanged.  Unchanged bones.  IMPRESSION: 1.  Interval extubation and removal of enteric tube.  Stable positioning of remaining support apparatus.  No pneumothorax. 2.  Grossly unchanged bilateral air space opacities worrisome for multifocal infection.  Follow-up to resolution is recommended.  Original Report Authenticated By: Rachel Moulds, M.D.    I have reviewed the patient's current medications.  Assessment/Plan: Problem #1 her renal failure acute kidney injury his pending creatinine this moment seems to be improving. He is none oliguric. Problem #  2 hypernatremia sodium  has improved but still high Problem #3 history of diabetes Problem #4 history of BPH presently with the Foley catheter Problem #5 history of anemia her in the iron deficiency Problem #6 history of mental retardation Problem #7 history of her pneumonia. Problem #8 hypokalemia potassium has corrected.  Plan: We'll DC half normal saline We'll check basic metabolic panel in the morning.   LOS: 24 days   Graysin Luczynski S 02/14/2012,8:15 AM

## 2012-02-15 LAB — BASIC METABOLIC PANEL
Calcium: 7.7 mg/dL — ABNORMAL LOW (ref 8.4–10.5)
Chloride: 101 mEq/L (ref 96–112)
Creatinine, Ser: 1.98 mg/dL — ABNORMAL HIGH (ref 0.50–1.35)
GFR calc Af Amer: 44 mL/min — ABNORMAL LOW (ref >90)
Sodium: 135 mEq/L (ref 135–145)

## 2012-02-15 LAB — GLUCOSE, CAPILLARY
Glucose-Capillary: 19 mg/dL — CL (ref 70–99)
Glucose-Capillary: 328 mg/dL — ABNORMAL HIGH (ref 70–99)
Glucose-Capillary: 45 mg/dL — ABNORMAL LOW (ref 70–99)
Glucose-Capillary: 50 mg/dL — ABNORMAL LOW (ref 70–99)
Glucose-Capillary: 558 mg/dL (ref 70–99)
Glucose-Capillary: 97 mg/dL (ref 70–99)
Glucose-Capillary: <10 mg/dL — CL (ref 70–99)

## 2012-02-15 LAB — CBC
HCT: 25.8 % — ABNORMAL LOW (ref 39.0–52.0)
Platelets: 185 10*3/uL (ref 150–400)
RBC: 2.96 MIL/uL — ABNORMAL LOW (ref 4.22–5.81)
RDW: 15.5 % (ref 11.5–15.5)
WBC: 21.9 10*3/uL — ABNORMAL HIGH (ref 4.0–10.5)

## 2012-02-15 LAB — GLUCOSE, RANDOM: Glucose, Bld: 112 mg/dL — ABNORMAL HIGH (ref 70–99)

## 2012-02-15 MED ORDER — IPRATROPIUM BROMIDE 0.02 % IN SOLN
0.5000 mg | Freq: Two times a day (BID) | RESPIRATORY_TRACT | Status: DC
  Administered 2012-02-15 – 2012-02-23 (×17): 0.5 mg via RESPIRATORY_TRACT
  Filled 2012-02-15 (×17): qty 2.5

## 2012-02-15 MED ORDER — PRO-STAT SUGAR FREE PO LIQD
30.0000 mL | Freq: Three times a day (TID) | ORAL | Status: DC
  Administered 2012-02-15 – 2012-02-23 (×18): 30 mL via ORAL
  Filled 2012-02-15 (×16): qty 30

## 2012-02-15 MED ORDER — SODIUM CHLORIDE 0.45 % IV SOLN
INTRAVENOUS | Status: DC
  Administered 2012-02-15: 75 mL via INTRAVENOUS

## 2012-02-15 MED ORDER — DEXTROSE 50 % IV SOLN
INTRAVENOUS | Status: AC
  Filled 2012-02-15: qty 50

## 2012-02-15 MED ORDER — INSULIN NPH (HUMAN) (ISOPHANE) 100 UNIT/ML ~~LOC~~ SUSP
8.0000 [IU] | Freq: Three times a day (TID) | SUBCUTANEOUS | Status: DC
  Administered 2012-02-15: 8 [IU] via SUBCUTANEOUS
  Filled 2012-02-15: qty 10

## 2012-02-15 MED ORDER — DEXTROSE 50 % IV SOLN
INTRAVENOUS | Status: AC
  Administered 2012-02-15: 50 mL
  Filled 2012-02-15: qty 50

## 2012-02-15 MED ORDER — DEXTROSE-NACL 5-0.45 % IV SOLN
INTRAVENOUS | Status: DC
  Administered 2012-02-15: 75 mL via INTRAVENOUS

## 2012-02-15 MED ORDER — ALBUTEROL SULFATE (5 MG/ML) 0.5% IN NEBU
2.5000 mg | INHALATION_SOLUTION | Freq: Two times a day (BID) | RESPIRATORY_TRACT | Status: DC
  Administered 2012-02-15 – 2012-02-23 (×16): 2.5 mg via RESPIRATORY_TRACT
  Filled 2012-02-15 (×16): qty 0.5

## 2012-02-15 NOTE — Progress Notes (Signed)
Patient ID: [name], male   DOB: [birth year], 51 y.o.   MRN: [id number]

## 2012-02-15 NOTE — Progress Notes (Signed)
Patient blood sugar high, md on call notified and orders given and followed Will continue to monitor patient closely Gaetana Kawahara, Tivis Ringer

## 2012-02-15 NOTE — Progress Notes (Signed)
Physical Therapy Treatment Patient Details Name: ABSHIR MORIS MRN: [id number] DOB: May 08, 1961 Today's Date: 02/15/2012 Time: [id number] PT Time Calculation (min): 17 min  PT Assessment / Plan / Recommendation Comments on Treatment Session  Attempted to work with pt.  He continues to awaken but is unable to follow any directions and does not respond to any stimulation.  He does appear to be somewhat uncomfortable, RN alerted.    Follow Up Recommendations       Barriers to Discharge        Equipment Recommendations       Recommendations for Other Services    Frequency     Plan Discharge plan remains appropriate    Precautions / Restrictions     Pertinent Vitals/Pain     Mobility  Bed Mobility Details for Bed Mobility Assistance: pt is still not responding to any commands or other stimulation...he continues to be total assist Transfers Transfer via Lift Equipment: Maximove    Exercises     PT Diagnosis:    PT Problem List:   PT Treatment Interventions:     PT Goals Additional Goals PT Goal: Additional Goal #1 - Progress: Not progressing  Visit Information  Last PT Received On: 02/15/12    Subjective Data      Cognition       Balance     End of Session PT - End of Session Equipment Utilized During Treatment: Gait belt Activity Tolerance: Treatment limited secondary to agitation;Other (comment) (tx limited by pt's inability to respond) Patient left: in bed;with bed alarm set Nurse Communication: Mobility status (pt appears to be uncomfortable)    Demetrios Isaacs L 02/15/2012, 8:55 AM

## 2012-02-15 NOTE — Clinical Social Work Note (Signed)
CSW spoke with [name] at [facility] to update on pt's progress. Facility will hold bed until Monday.  CSW notified [name] of 90 day pasarr and will follow up.    [name], [place]

## 2012-02-15 NOTE — Progress Notes (Signed)
Nutrition Follow-up  Diet Order:  Dys 1 Honey-thick-liquids po intake 0-50%. Fed by staff. Staff reported coughing with honey-thick liquid earlier today.  Meds: Scheduled Meds:   . albuterol  2.5 mg Nebulization BID   And  . ipratropium  0.5 mg Nebulization BID  . amLODipine  5 mg Oral Daily  . antiseptic oral rinse  15 mL Mouth Rinse QID  . chlorhexidine  15 mL Mouth Rinse BID  . dextrose      . enoxaparin (LOVENOX) injection  30 mg Subcutaneous Q24H  . famotidine  20 mg Oral Daily  . insulin aspart  0-15 Units Subcutaneous Q4H  . insulin NPH  8 Units Subcutaneous Q8H  . lisinopril  10 mg Oral Daily  . pantoprazole  40 mg Oral Q1200  . polyethylene glycol  17 g Oral q morning - 10a  . sertraline  150 mg Oral Daily  . sodium chloride  10-40 mL Intracatheter Q12H  . Tamsulosin HCl  0.4 mg Oral Daily  . traZODone  50 mg Oral QHS  . ziprasidone  40 mg Oral BID WC  . DISCONTD: albuterol  2.5 mg Nebulization Q4H  . DISCONTD: insulin NPH  5 Units Subcutaneous Q8H  . DISCONTD: ipratropium  0.5 mg Nebulization Q4H   Continuous Infusions:   . dextrose 5 % and 0.45% NaCl 75 mL (02/15/12 0856)  . DISCONTD: dextrose 5 % with KCl 20 mEq / L 20 mEq (02/14/12 0836)   PRN Meds:.acetaminophen, albuterol, ALPRAZolam, alum & mag hydroxide-simeth, dextrose, dextrose, hyoscyamine, LORazepam, morphine injection, ondansetron, sodium chloride  Labs:  CMP     Component Value Date/Time   NA 135 02/15/2012 0602   K 5.4* 02/15/2012 0602   CL 101 02/15/2012 0602   CO2 26 02/15/2012 0602   GLUCOSE 433* 02/15/2012 0602   BUN 43* 02/15/2012 0602   CREATININE 1.98* 02/15/2012 0602   CALCIUM 7.7* 02/15/2012 0602   PROT 5.8* 02/02/2012 0458   ALBUMIN 1.8* 02/02/2012 0458   AST 26 02/02/2012 0458   ALT 22 02/02/2012 0458   ALKPHOS 149* 02/02/2012 0458   BILITOT 0.3 02/02/2012 0458   GFRNONAA 38* 02/15/2012 0602   GFRAA 44* 02/15/2012 0602     Intake/Output Summary (Last 24 hours) at 02/15/12 1431 Last data filed at  02/15/12 0830  Gross per 24 hour  Intake     30 ml  Output   2600 ml  Net  -2570 ml    Weight Status: 151# (68 kg) wt currently within 2# of admission wt 149# (01/21/12)  Re-estimated needs:   1904-2040 kcal/day  85-95 gr/day 1 ml/kcal  Nutrition Dx: Inadequate oral intake slow to improve  Goal:  Pt will consume >80% of est energy and protein needs. Goal not met  Interventions:   Magic Cup with lunch daily (290 kcal, 9 gr protein)  ProStat 30 ml BID (200 kcal, 30 gr protein)  Fed by staff  Monitor:  Diet tol, po intake, labs   Frederik Schmidt Pager # [phone]

## 2012-02-15 NOTE — Progress Notes (Signed)
Subjective: He does respond some but he's not back to baseline yet.  Objective: Vital signs in last 24 hours: Temp:  [97.9 F (36.6 C)-98.8 F (37.1 C)] 98.8 F (37.1 C) (06/14 0554) Pulse Rate:  [78-84] 82  (06/14 0652) Resp:  [19-20] 19  (06/14 0652) BP: (147-153)/(71-83) 152/83 mmHg (06/14 0554) SpO2:  [92 %-100 %] 95 % (06/14 0747) Weight change:  Last BM Date: 02/14/12  Intake/Output from previous day: 06/13 0701 - 06/14 0700 In: 80 [P.O.:80] Out: 3025 [Urine:3025]  PHYSICAL EXAM General appearance: no distress and Slow to respond Resp: rhonchi bilaterally Cardio: regular rate and rhythm, S1, S2 normal, no murmur, click, rub or gallop GI: soft, non-tender; bowel sounds normal; no masses,  no organomegaly Extremities: extremities normal, atraumatic, no cyanosis or edema  Lab Results:    Basic Metabolic Panel:  Basename 02/15/12 0602 02/14/12 0606  NA 135 150*  K 5.4* 4.4  CL 101 111  CO2 26 30  GLUCOSE 433* 231*  BUN 43* 54*  CREATININE 1.98* 2.06*  CALCIUM 7.7* 8.3*  MG -- --  PHOS -- --   Liver Function Tests: No results found for this basename: AST:2,ALT:2,ALKPHOS:2,BILITOT:2,PROT:2,ALBUMIN:2 in the last 72 hours No results found for this basename: LIPASE:2,AMYLASE:2 in the last 72 hours No results found for this basename: AMMONIA:2 in the last 72 hours CBC:  Basename 02/15/12 0602 02/13/12 0431  WBC 21.9* 23.5*  NEUTROABS -- --  HGB 7.7* 9.2*  HCT 25.8* 29.6*  MCV 87.2 85.8  PLT 185 259   Cardiac Enzymes: No results found for this basename: CKTOTAL:3,CKMB:3,CKMBINDEX:3,TROPONINI:3 in the last 72 hours BNP: No results found for this basename: PROBNP:3 in the last 72 hours D-Dimer: No results found for this basename: DDIMER:2 in the last 72 hours CBG:  Basename 02/15/12 0755 02/15/12 0420 02/15/12 0045 02/15/12 0017 02/14/12 2025 02/14/12 1616  GLUCAP 108* 155* 145* 50* 119* 142*   Hemoglobin A1C: No results found for this basename: HGBA1C  in the last 72 hours Fasting Lipid Panel: No results found for this basename: CHOL,HDL,LDLCALC,TRIG,CHOLHDL,LDLDIRECT in the last 72 hours Thyroid Function Tests: No results found for this basename: TSH,T4TOTAL,FREET4,T3FREE,THYROIDAB in the last 72 hours Anemia Panel: No results found for this basename: VITAMINB12,FOLATE,FERRITIN,TIBC,IRON,RETICCTPCT in the last 72 hours Coagulation: No results found for this basename: LABPROT:2,INR:2 in the last 72 hours Urine Drug Screen: Drugs of Abuse  No results found for this basename: labopia, cocainscrnur, labbenz, amphetmu, thcu, labbarb    Alcohol Level: No results found for this basename: ETH:2 in the last 72 hours Urinalysis: No results found for this basename: COLORURINE:2,APPERANCEUR:2,LABSPEC:2,PHURINE:2,GLUCOSEU:2,HGBUR:2,BILIRUBINUR:2,KETONESUR:2,PROTEINUR:2,UROBILINOGEN:2,NITRITE:2,LEUKOCYTESUR:2 in the last 72 hours Misc. Labs:  ABGS No results found for this basename: PHART,PCO2,PO2ART,TCO2,HCO3 in the last 72 hours CULTURES No results found for this or any previous visit (from the past 240 hour(s)). Studies/Results: No results found.  Medications:  Scheduled:   . albuterol  2.5 mg Nebulization BID   And  . ipratropium  0.5 mg Nebulization BID  . amLODipine  5 mg Oral Daily  . antiseptic oral rinse  15 mL Mouth Rinse QID  . chlorhexidine  15 mL Mouth Rinse BID  . dextrose      . enoxaparin (LOVENOX) injection  30 mg Subcutaneous Q24H  . famotidine  20 mg Oral Daily  . insulin aspart  0-15 Units Subcutaneous Q4H  . insulin NPH  8 Units Subcutaneous Q8H  . lisinopril  10 mg Oral Daily  . pantoprazole  40 mg Oral Q1200  . polyethylene glycol  17 g Oral q morning - 10a  . sertraline  150 mg Oral Daily  . sodium chloride  10-40 mL Intracatheter Q12H  . Tamsulosin HCl  0.4 mg Oral Daily  . traZODone  50 mg Oral QHS  . ziprasidone  40 mg Oral BID WC  . DISCONTD: albuterol  2.5 mg Nebulization Q4H  . DISCONTD: insulin NPH   5 Units Subcutaneous Q8H  . DISCONTD: ipratropium  0.5 mg Nebulization Q4H   Continuous:   . dextrose 5 % and 0.45% NaCl    . DISCONTD: dextrose 5 % with KCl 20 mEq / L 20 mEq (02/14/12 0836)   [id number], albuterol, ALPRAZolam, alum & mag hydroxide-simeth, dextrose, dextrose, hyoscyamine, LORazepam, morphine injection, ondansetron, sodium chloride  Assesment: He has been in the hospital with respiratory failure healthcare associated pneumonia and sepsis. He is improving slowly. He still has problems with electrolyte imbalance renal failure and Active Problems:  Acute renal failure  Pneumonia  Sepsis  Mental retardation  HCAP (healthcare-associated pneumonia)  SIRS (systemic inflammatory response syndrome)  Respiratory failure  Diabetes mellitus out of control    Plan: I will plan to sign off at this point. Thank you for allowing me to see him with you    LOS: 25 days   Marieta Markov L 02/15/2012, 8:41 AM

## 2012-02-15 NOTE — Progress Notes (Signed)
NAME:  [name], [name]           ACCOUNT NO.:  [id number]  MEDICAL RECORD NO.:  [id number]  LOCATION:                                 FACILITY:  PHYSICIAN:  Oscar Rougeau, MD DATE OF BIRTH:  [birth year]  DATE OF PROCEDURE:  02/15/2012 DATE OF DISCHARGE:                                PROGRESS NOTE   SUBJECTIVE:  The patient is doing better this morning.  He is cooperating with oral intake and no hypoglycemia.  He remains on dextrose support, and his nurse at bedside reporting significant progress in his clinical stability.  OBJECTIVE:  GENERAL:  The patient is awake, not in distress. VITAL SIGNS:  Blood pressure is 152/83, pulse rate is 82, temperature 98.8. HEENT:  Moist mucous membranes.  Well hydrated. CHEST:  Bilateral rhonchi and poor entry of air at the bases. CARDIOVASCULAR:  Normal S1, S2, also distant.  No murmur.  No gallop. ABDOMEN:  Soft.  Bowel sounds are positive. EXTREMITIES:  No edema.  LABORATORY DATA:  Improved sodium at 135, potassium is elevated at 5.4, chloride 101, BUN 43, creatinine 1.98.   Recent a1c was 8%. ASSESSMENT: 1. Status post on mechanical ventilation for respiratory failure. 2. Ongoing bilateral pneumonia on treatment. 3. Status post sepsis. 4. Dysphagia. 5. Type 1 diabetes, difficult to control. 6. Acute-on-chronic renal failure. 7. Anemia. 8. Mental retardation.  PLAN:  He will continue to require intensive multidisciplinary care. Appreciate Nephrology input.  We will also need continued Endocrine care, currently on dextrose support while titrating his oral intake on NPH 8 units t.i.d. before meals and NovoLog sliding scale coverage. Continue bronchodilator therapy along with oxygen support.  He will need continued bedside assistance on 1 and 1 especially at mealtimes.  He will continue to require 24-hour supervised care. Although the patient is making a good progress, he is at this point not ready for transfer to nursing home.   We will continue to monitor blood glucose, his chemistry in 24 hours.          ______________________________ Loni Beckwith, MD     GN/MEDQ  D:  02/15/2012  T:  02/15/2012  Job:  [id number]

## 2012-02-15 NOTE — Progress Notes (Signed)
Subjective: Interval History: none.  Objective: Vital signs in last 24 hours: Temp:  [97.9 F (36.6 C)-98.8 F (37.1 C)] 98.8 F (37.1 C) (06/14 0554) Pulse Rate:  [78-84] 82  (06/14 0652) Resp:  [19-20] 19  (06/14 0652) BP: (147-153)/(71-83) 152/83 mmHg (06/14 0554) SpO2:  [92 %-100 %] 95 % (06/14 [id number]) Weight change:   Intake/Output from previous day: 06/13 0701 - 06/14 0700 In: 80 [P.O.:80] Out: 3025 [Urine:3025] Intake/Output this shift:    General appearance: alert and no distress Resp: clear to auscultation bilaterally Extremities: extremities normal, atraumatic, no cyanosis or edema  Lab Results:  [facility] 02/15/12 0602 02/13/12 0431  WBC 21.9* 23.5*  HGB 7.7* 9.2*  HCT 25.8* 29.6*  PLT 185 259   BMET:  Basename 02/15/12 0602 02/14/12 0606  NA 135 150*  K 5.4* 4.4  CL 101 111  CO2 26 30  GLUCOSE 433* 231*  BUN 43* 54*  CREATININE 1.98* 2.06*  CALCIUM 7.7* 8.3*   No results found for this basename: PTH:2 in the last 72 hours Iron Studies: No results found for this basename: IRON,TIBC,TRANSFERRIN,FERRITIN in the last 72 hours  Studies/Results: No results found.  I have reviewed the patient's current medications.  Assessment/Plan: Problem #1  renal failure acute kidney injury superimposed on chronic his BUN is 43 creatinine is 1.98 renal function seems to be improving progressively. Presently patient is none oliguric. Problem #2 hyponatremia sodium is 135 has improved Problem #3 hypokalemia potassium is 5.4 corrected. Problem #4 history of anemia iron deficiency H&H is declining Problem #5 history of pneumonia and respiratory failure he is status post intubation and presently extubated. His white blood cell count is improving and his a febrile. Problem #6 history of diabetes Problem #7 history of mental sedation Problem #8 history of BPH with chronic Foley catheter placement. Plan: We'll DC IV fluid to with potassium We'll start him on half normal  saline at 75 cc per hour We'll continue following his basic metabolic panel.     LOS: 25 days   Vela Render S 02/15/2012,7:34 AM

## 2012-02-16 ENCOUNTER — Inpatient Hospital Stay (HOSPITAL_COMMUNITY): Payer: Medicaid Other

## 2012-02-16 LAB — GLUCOSE, CAPILLARY
Glucose-Capillary: 147 mg/dL — ABNORMAL HIGH (ref 70–99)
Glucose-Capillary: 159 mg/dL — ABNORMAL HIGH (ref 70–99)
Glucose-Capillary: 164 mg/dL — ABNORMAL HIGH (ref 70–99)
Glucose-Capillary: 197 mg/dL — ABNORMAL HIGH (ref 70–99)
Glucose-Capillary: 265 mg/dL — ABNORMAL HIGH (ref 70–99)
Glucose-Capillary: 290 mg/dL — ABNORMAL HIGH (ref 70–99)

## 2012-02-16 LAB — BASIC METABOLIC PANEL
BUN: 54 mg/dL — ABNORMAL HIGH (ref 6–23)
Calcium: 7.8 mg/dL — ABNORMAL LOW (ref 8.4–10.5)
Creatinine, Ser: 2.55 mg/dL — ABNORMAL HIGH (ref 0.50–1.35)
GFR calc non Af Amer: 28 mL/min — ABNORMAL LOW (ref >90)
Glucose, Bld: 184 mg/dL — ABNORMAL HIGH (ref 70–99)

## 2012-02-16 LAB — BLOOD GAS, ARTERIAL
Bicarbonate: 24.7 mEq/L — ABNORMAL HIGH (ref 20.0–24.0)
Delivery systems: POSITIVE
Expiratory PAP: 5
Inspiratory PAP: 15
O2 Saturation: 99 %
TCO2: 23.2 mmol/L (ref 0–100)
pCO2 arterial: 29.1 mmHg — ABNORMAL LOW (ref 35.0–45.0)
pH, Arterial: 7.511 — ABNORMAL HIGH (ref 7.350–7.450)
pO2, Arterial: 161 mmHg — ABNORMAL HIGH (ref 80.0–100.0)

## 2012-02-16 LAB — CBC
HCT: 25.5 % — ABNORMAL LOW (ref 39.0–52.0)
RDW: 15.5 % (ref 11.5–15.5)
WBC: 24.5 10*3/uL — ABNORMAL HIGH (ref 4.0–10.5)

## 2012-02-16 LAB — DIFFERENTIAL
Basophils Absolute: 0 10*3/uL (ref 0.0–0.1)
Lymphocytes Relative: 4 % — ABNORMAL LOW (ref 12–46)
Monocytes Absolute: 0.2 10*3/uL (ref 0.1–1.0)
Neutro Abs: 23.1 10*3/uL — ABNORMAL HIGH (ref 1.7–7.7)
Neutrophils Relative %: 94 % — ABNORMAL HIGH (ref 43–77)

## 2012-02-16 LAB — PROTIME-INR
INR: 1.24 (ref 0.00–1.49)
Prothrombin Time: 15.9 seconds — ABNORMAL HIGH (ref 11.6–15.2)

## 2012-02-16 LAB — MAGNESIUM: Magnesium: 1.6 mg/dL (ref 1.5–2.5)

## 2012-02-16 LAB — LACTIC ACID, PLASMA: Lactic Acid, Venous: 2.2 mmol/L (ref 0.5–2.2)

## 2012-02-16 MED ORDER — INSULIN NPH (HUMAN) (ISOPHANE) 100 UNIT/ML ~~LOC~~ SUSP
5.0000 [IU] | Freq: Three times a day (TID) | SUBCUTANEOUS | Status: DC
  Administered 2012-02-16 – 2012-02-21 (×13): 5 [IU] via SUBCUTANEOUS
  Filled 2012-02-16: qty 10

## 2012-02-16 MED ORDER — DEXTROSE 50 % IV SOLN
INTRAVENOUS | Status: AC
  Filled 2012-02-16: qty 50

## 2012-02-16 MED ORDER — MAGNESIUM SULFATE 50 % IJ SOLN
2.0000 g | Freq: Once | INTRAVENOUS | Status: AC
  Administered 2012-02-16: 2 g via INTRAVENOUS
  Filled 2012-02-16: qty 4

## 2012-02-16 MED ORDER — DEXTROSE 10 % IV SOLN
INTRAVENOUS | Status: DC
  Administered 2012-02-16: 01:00:00 via INTRAVENOUS

## 2012-02-16 MED ORDER — ACETAMINOPHEN 10 MG/ML IV SOLN
1000.0000 mg | Freq: Once | INTRAVENOUS | Status: AC
  Administered 2012-02-16: 1000 mg via INTRAVENOUS
  Filled 2012-02-16: qty 100

## 2012-02-16 MED ORDER — VANCOMYCIN HCL IN DEXTROSE 1-5 GM/200ML-% IV SOLN
1000.0000 mg | INTRAVENOUS | Status: DC
  Administered 2012-02-16 – 2012-02-22 (×6): 1000 mg via INTRAVENOUS
  Filled 2012-02-16 (×11): qty 200

## 2012-02-16 MED ORDER — INSULIN ASPART 100 UNIT/ML ~~LOC~~ SOLN
0.0000 [IU] | Freq: Four times a day (QID) | SUBCUTANEOUS | Status: DC
  Administered 2012-02-16: 9 [IU] via SUBCUTANEOUS
  Administered 2012-02-16: 2 [IU] via SUBCUTANEOUS

## 2012-02-16 MED ORDER — INSULIN ASPART 100 UNIT/ML ~~LOC~~ SOLN
0.0000 [IU] | SUBCUTANEOUS | Status: DC
  Administered 2012-02-16: 5 [IU] via SUBCUTANEOUS

## 2012-02-16 MED ORDER — DEXTROSE-NACL 5-0.45 % IV SOLN
INTRAVENOUS | Status: DC
  Administered 2012-02-16: 100 mL via INTRAVENOUS

## 2012-02-16 MED ORDER — PIPERACILLIN-TAZOBACTAM 3.375 G IVPB
3.3750 g | Freq: Three times a day (TID) | INTRAVENOUS | Status: DC
  Administered 2012-02-16 – 2012-02-23 (×20): 3.375 g via INTRAVENOUS
  Filled 2012-02-16 (×28): qty 50

## 2012-02-16 MED ORDER — INSULIN ASPART 100 UNIT/ML ~~LOC~~ SOLN
0.0000 [IU] | Freq: Four times a day (QID) | SUBCUTANEOUS | Status: DC
  Administered 2012-02-17: 5 [IU] via SUBCUTANEOUS
  Administered 2012-02-17: 3 [IU] via SUBCUTANEOUS

## 2012-02-16 MED ORDER — SODIUM CHLORIDE 0.9 % IV BOLUS (SEPSIS)
1000.0000 mL | Freq: Once | INTRAVENOUS | Status: AC
  Administered 2012-02-16: 1000 mL via INTRAVENOUS

## 2012-02-16 MED ORDER — MAGNESIUM SULFATE 40 MG/ML IJ SOLN
INTRAMUSCULAR | Status: AC
  Filled 2012-02-16: qty 50

## 2012-02-16 MED ORDER — DEXTROSE-NACL 5-0.9 % IV SOLN
INTRAVENOUS | Status: DC
  Administered 2012-02-16: 20:00:00 via INTRAVENOUS
  Administered 2012-02-16: 100 mL/h via INTRAVENOUS
  Administered 2012-02-17 – 2012-02-18 (×3): via INTRAVENOUS

## 2012-02-16 NOTE — Progress Notes (Signed)
[id number] 

## 2012-02-16 NOTE — Progress Notes (Addendum)
[name]  MRN: [id number]  DOB/AGE: [birth year] 51 y.o.  Primary Care Physician:[name],TESFAYE, MD  Admit date: 01/21/2012  Chief Complaint:  Chief Complaint  Patient presents with  . Hypoglycemia    S-Pt presented on  01/21/2012 with  Chief Complaint  Patient presents with  . Hypoglycemia  . Pt offer no complaints. Pt on Bipap.  Meds    . acetaminophen  1,000 mg Intravenous Once  . albuterol  2.5 mg Nebulization BID   And  . ipratropium  0.5 mg Nebulization BID  . amLODipine  5 mg Oral Daily  . antiseptic oral rinse  15 mL Mouth Rinse QID  . chlorhexidine  15 mL Mouth Rinse BID  . dextrose      . dextrose      . dextrose      . dextrose      . dextrose      . enoxaparin (LOVENOX) injection  30 mg Subcutaneous Q24H  . famotidine  20 mg Oral Daily  . feeding supplement  30 mL Oral TID WC  . insulin aspart  0-9 Units Subcutaneous Q4H  . lisinopril  10 mg Oral Daily  . magnesium sulfate LVP 250-500 ml  2 g Intravenous Once  . pantoprazole  40 mg Oral Q1200  . piperacillin-tazobactam (ZOSYN)  IV  3.375 g Intravenous Q8H  . polyethylene glycol  17 g Oral q morning - 10a  . sertraline  150 mg Oral Daily  . sodium chloride  1,000 mL Intravenous Once  . sodium chloride  10-40 mL Intracatheter Q12H  . Tamsulosin HCl  0.4 mg Oral Daily  . traZODone  50 mg Oral QHS  . vancomycin  1,000 mg Intravenous Q24H  . ziprasidone  40 mg Oral BID WC  . DISCONTD: insulin aspart  0-15 Units Subcutaneous Q4H  . DISCONTD: insulin NPH  8 Units Subcutaneous Q8H     Physical Exam: Vital signs in last 24 hours: Temp:  [98.1 F (36.7 C)-103 F (39.4 C)] 99.7 F (37.6 C) (06/15 0631) Pulse Rate:  [84-114] 101  (06/15 0645) Resp:  [20-40] 27  (06/15 0645) BP: (89-148)/(41-86) 132/74 mmHg (06/15 0645) SpO2:  [87 %-100 %] 100 % (06/15 0727) FiO2 (%):  [60 %] 60 % (06/15 0727) Weight:  [145 lb 4.5 oz (65.9 kg)-145 lb 11.6 oz (66.1 kg)] 145 lb 11.6 oz (66.1 kg) (06/15  0500) Weight change:  Last BM Date: 02/14/12  Intake/Output from previous day: 06/14 0701 - 06/15 0700 In: 3580.4 [P.O.:360; I.V.:2220.4; IV Piggyback:1000] Out: 1800 [Urine:1800]     Physical Exam: General- non verbal , open eyes in stimuli Resp-Bipap in situ   Rhonchi+ CVS- S1S2 regular inrate and rhythm GIT- BS+, soft, NT, ND EXT- NO LE Edema, Cyanosis   Lab Results: CBC  Basename 02/16/12 0425 02/15/12 0602  WBC 24.5* 21.9*  HGB 8.1* 7.7*  HCT 25.5* 25.8*  PLT 207 185    BMET  Basename 02/16/12 0425 02/15/12 2059 02/15/12 0602  NA 141 -- 135  K 3.8 -- 5.4*  CL 104 -- 101  CO2 26 -- 26  GLUCOSE 184* 112* --  BUN 54* -- 43*  CREATININE 2.55* -- 1.98*  CALCIUM 7.8* -- 7.7*    MICRO Recent Results (from the past 240 hour(s))  CULTURE, BLOOD (ROUTINE X 2)     Status: Normal (Preliminary result)   Collection Time   02/16/12  4:24 AM      Component Value Range Status Comment   Specimen  Description Blood PICC LINE   Final    Special Requests     Final    Value: BOTTLES DRAWN AEROBIC AND ANAEROBIC DRAWN BY RN [facility]   Culture PENDING   Incomplete    Report Status PENDING   Incomplete   CULTURE, BLOOD (ROUTINE X 2)     Status: Normal (Preliminary result)   Collection Time   02/16/12  4:24 AM      Component Value Range Status Comment   Specimen Description Blood BLOOD RIGHT HAND   Final    Special Requests BOTTLES DRAWN AEROBIC AND ANAEROBIC 6CC   Final    Culture PENDING   Incomplete    Report Status PENDING   Incomplete       Lab Results  Component Value Date   CALCIUM 7.8* 02/16/2012   CAION 1.25 12/04/2011   PHOS 4.1 02/16/2012    CXR 6/15 IMPRESSION:  Interval worsening and redistribution of patchy bilateral airspace  opacities, particularly on the right, compatible with worsening  multifocal pneumonia.    Impression: 1)Renal  AKI secondary to Prerenal/ ATN  AKI secondary to Hypotension+ sepsis + ACE  AKI on CKD  Earlier AKI- NON OLiguric ATN    Creat now  at 2.5 Most likely another episode of AKI from sepsis/Hypotension.    CKD stage 3 .  CKD since 2011  CKD secondary to HTN/ DM  Progression of CKD -Marked with AKI    2)CVS- Hemodynamically stable   3)Anemia HGb NOT at goal (9--11)  Iron deficincey as Iron less than 10 on 5/24  GI work up-as per Primary team   4)ID SEpsis ON ABX   5)DM- Uncontrolled  Being managed by Primary team /Endo.   6)Hypernatremia-  Sodium now at goal Pt earlier had HypoNatremia secondary to Hypovolemia + SSRI   7)Acid base  Co2 at goal   8) CNS- Metabolic Encephalopathy  9)REsp- Pt now on [place]:  Will change IVF to D5NS Will follow Bmet Will d/c amlodipine and ACE  as septic again    [name] S 02/16/2012, 8:47 AM

## 2012-02-16 NOTE — Progress Notes (Signed)
Pt has received 2 liter boluses of NS. MD at bedside. CVP was set up and zeroed. CVP =8. MD order to give another liter bolus. If no change in BP call MD. Will continue to monitor.

## 2012-02-16 NOTE — Progress Notes (Addendum)
eLink Physician-Brief Progress Note Patient Name: [name] DOB: 06-07-1961 MRN: [id number]  Date of Service  02/16/2012   HPI/Events of Note  Autistic patient. Transferred for sugar issue but now Fever 103F, resp distress and less responsive   eICU Interventions  Check pan culture  check sepsis biomarkers Check abg cxr Stat labs Reassess   02/16/2012 at .5:05 AM CXR worse -start bipap    Intervention Category Major Interventions: Respiratory failure - evaluation and management;Infection - evaluation and management  Valrie Jia 02/16/2012, 4:15 AM

## 2012-02-16 NOTE — Progress Notes (Signed)
Pt opens eyes to voice and follows commands appropriately.  When asked if pt was in any pain he shook his head to indicate "no"; when asked if he was thirsty he shook his head to indicate "yes".  After cleaning pt's mouth I attempted to give him honey thickened tea.  After taking one spoon of tea he began to cough.  Pt will be kept NPO due to high probability of aspiration.

## 2012-02-16 NOTE — Progress Notes (Signed)
[id number] 

## 2012-02-16 NOTE — Progress Notes (Signed)
NAMEOCIEL, MIZE           ACCOUNT NO.:  [id number]  MEDICAL RECORD NO.:  [id number]  LOCATION:  IC07                          FACILITY:  [facility]  PHYSICIAN:  Unk Lightning, MDDATE OF BIRTH:  [birth year]  DATE OF PROCEDURE: DATE OF DISCHARGE:                                PROGRESS NOTE   ADDENDUM:  Additionally, the plan is to get stat procalcitonin and lactic acid if these are elevated, the further corroborative evidence of septicemia to institute sepsis protocol after IV fluid bolus.     Unk Lightning, MD     RMD/MEDQ  D:  02/16/2012  T:  02/16/2012  Job:  [id number]

## 2012-02-16 NOTE — Progress Notes (Signed)
[name], [name]           ACCOUNT NO.:  [id number]  MEDICAL RECORD NO.:  [id number]  LOCATION:  IC07                          FACILITY:  [facility]  PHYSICIAN:  Unk Lightning, MDDATE OF BIRTH:  [birth year]  DATE OF PROCEDURE: DATE OF DISCHARGE:                                PROGRESS NOTE   The patient has mental retardation, bilateral patchy alveolar infiltrates which are worsening upon this morning's chest x-ray.  The patient has been off antibiotics for a period of time.  It is now hypotensive, systolic of 72.  White count increased to 24.5 thousand, was also anemic, worsening renal failure with a creatinine increasing to 2.55.  Consideration given to rhabdomyolysis with CK of 1100 and a normal troponin.  He had episode of hypoglycemia last night which was rectified by stopping insulin and D10 infusions.  Blood pressure at present is 72/62, pulse is 101 and regular, temperature 99.7, respiratory rate is 27.  ABG this morning is pH 7.51, pCO2 of 29, pO2 of 161.  WBCs increased from 22,000 to 24.5 thousand.  Hemoglobin 8.1, stable.  Creatinine increased from 1.98 to 2.55.  Chest x-ray reveals worsening bilateral alveolar infiltrates.  Lungs show good air entry bilaterally.  Scattered rhonchi.  No rales appreciable.  Heart, regular rhythm.  No S3, S4.  Abdomen essentially benign.  Plan right now is to culture blood x2 and urine which was done this morning, hold insulin. Resume vanc and Zosyn today per pharmacy protocol.  Fluid resuscitation with liter of normal saline to see if pressure improves.  Monitor renal function daily, white count, and pseudosepticemia protocol, aggressive fluid resuscitation with possible rhabdomyolysis.  The patient will be intubated again if respiratory insufficiency ensues, the patient is at worsening multisystemic failure.  Prognosis is poor at present due to multifactorial, multisystemic failure.     Unk Lightning,  MD     RMD/MEDQ  D:  02/16/2012  T:  02/16/2012  Job:  [id number]

## 2012-02-16 NOTE — Progress Notes (Signed)
NAME:  [name], [name]           ACCOUNT NO.:  [id number]  MEDICAL RECORD NO.:  [id number]  LOCATION:  IC07                          FACILITY:  [facility]  PHYSICIAN:  Aliany Fiorenza G. Everette Rank, MD   DATE OF BIRTH:  Mar 30, 1961  DATE OF PROCEDURE: DATE OF DISCHARGE:                                PROGRESS NOTE   SUBJECTIVE:  This patient has been in the hospital and treated for respiratory failure, pneumonia, and sepsis.  He had been improving with reference to this problem.  He does have diabetes and has been on NPH insulin.  Following this, he developed severe hypoglycemia during the night on the medical floor.  He was given boluses of D50.  On several occasions his blood sugars remained extremely low.  He was transferred down to the intensive care unit, and was started on D10 and water.  His sugars improved on this regimen.  His insulin was held.  His sugars were as low as less than 10, 31, 24, 19, but more recently is 164, 171.  OBJECTIVE:  VITAL SIGNS:  Blood pressure 132/74, respiration 27, pulse 101, temp 99.7. LUNGS:  Diminished breath sounds. HEART:  Regular rhythm. ABDOMEN:  No palpable organs or masses.  ASSESSMENT:  The patient has type 1 diabetes difficult to control.  Has had hypoglycemia through the night.  He does have a history of respiratory failure, ongoing bilateral pneumonia with treatment, status post sepsis mental retardation.  PLAN:  To continue current regimen.  The patient will be seen by Dr. Dorris Fetch, endocrinologist.  Continue to monitor blood sugars.  Continue current regimen.  One hour was spent with the patient through the tonight.     Zurii Hewes G. Everette Rank, MD     AGM/MEDQ  D:  02/16/2012  T:  02/16/2012  Job:  [id number]

## 2012-02-16 NOTE — Progress Notes (Signed)
Late Note:  Patient would not respond to staff and would barely open eyes when stimulated.  Blood sugars fluctuated during shift.  Amps of D 50 were given, IV fluid was changed from 1/2 normal to D 5 1/1 to D 10.  Physician was notified 3x during shift.  Last blood sugar was 164.  Patient will be transferred to ICU.  Report was called to [facility].

## 2012-02-16 NOTE — Progress Notes (Signed)
ANTIBIOTIC CONSULT NOTE - INITIAL  Pharmacy Consult for Vancomycin and Zosyn Indication: pneumonia  No Known Allergies  Patient Measurements: Height: 5\' 10"  (177.8 cm) Weight: 145 lb 11.6 oz (66.1 kg) IBW/kg (Calculated) : 73    Vital Signs: Temp: 99.7 F (37.6 C) (06/15 0631) Temp src: Axillary (06/15 0400) BP: 132/74 mmHg (06/15 0645) Pulse Rate: 101  (06/15 0645) Intake/Output from previous day: 06/14 0701 - 06/15 0700 In: 3580.4 [P.O.:360; I.V.:2220.4; IV Piggyback:1000] Out: 1800 [Urine:1800] Intake/Output from this shift:    Labs:  Basename 02/16/12 0425 02/15/12 0602 02/14/12 0606  WBC 24.5* 21.9* --  HGB 8.1* 7.7* --  PLT 207 185 --  LABCREA -- -- --  CREATININE 2.55* 1.98* 2.06*   Estimated Creatinine Clearance: 32.4 ml/min (by C-G formula based on Cr of 2.55). No results found for this basename: VANCOTROUGH:2,VANCOPEAK:2,VANCORANDOM:2,GENTTROUGH:2,GENTPEAK:2,GENTRANDOM:2,TOBRATROUGH:2,TOBRAPEAK:2,TOBRARND:2,AMIKACINPEAK:2,AMIKACINTROU:2,AMIKACIN:2, in the last 72 hours   Microbiology: Recent Results (from the past 720 hour(s))  URINE CULTURE     Status: Normal   Collection Time   01/21/12  3:36 PM      Component Value Range Status Comment   Specimen Description URINE, CATHETERIZED   Final    Special Requests NONE   Final    Culture  Setup Time [id number]   Final    Colony Count >=100,000 COLONIES/ML   Final    Culture     Final    Value: ENTEROCOCCUS SPECIES     YEAST   Report Status 01/25/2012 FINAL   Final    Organism ID, Bacteria ENTEROCOCCUS SPECIES   Final   MRSA PCR SCREENING     Status: Abnormal   Collection Time   01/22/12  9:35 AM      Component Value Range Status Comment   MRSA by PCR INVALID RESULTS, SPECIMEN SENT FOR CULTURE (*) NEGATIVE Final   MRSA CULTURE     Status: Normal   Collection Time   01/22/12  9:35 AM      Component Value Range Status Comment   Specimen Description NOSE NASAL SWAB   Final    Special Requests NONE   Final     Culture     Final    Value: NO STAPHYLOCOCCUS AUREUS ISOLATED     Note: NOMRSA   Report Status 01/24/2012 FINAL   Final   CULTURE, BLOOD (ROUTINE X 2)     Status: Normal   Collection Time   01/24/12  4:59 PM      Component Value Range Status Comment   Specimen Description BLOOD RIGHT HAND   Final    Special Requests BOTTLES DRAWN AEROBIC AND ANAEROBIC [place]   Final    Culture NO GROWTH 5 DAYS   Final    Report Status 01/29/2012 FINAL   Final   CULTURE, BLOOD (ROUTINE X 2)     Status: Normal   Collection Time   01/24/12  5:03 PM      Component Value Range Status Comment   Specimen Description BLOOD LEFT HAND   Final    Special Requests BOTTLES DRAWN AEROBIC AND ANAEROBIC [place]   Final    Culture NO GROWTH 5 DAYS   Final    Report Status 01/29/2012 FINAL   Final   CLOSTRIDIUM DIFFICILE BY PCR     Status: Normal   Collection Time   01/27/12  9:00 PM      Component Value Range Status Comment   C difficile by pcr NEGATIVE  NEGATIVE Final   CULTURE, BLOOD (ROUTINE X  2)     Status: Normal   Collection Time   01/30/12 10:24 AM      Component Value Range Status Comment   Specimen Description BLOOD RIGHT HAND   Final    Special Requests BOTTLES DRAWN AEROBIC AND ANAEROBIC 6CC    Final    Culture NO GROWTH 5 DAYS   Final    Report Status 02/04/2012 FINAL   Final   CULTURE, BLOOD (ROUTINE X 2)     Status: Normal   Collection Time   01/30/12 10:44 AM      Component Value Range Status Comment   Specimen Description BLOOD LEFT HAND   Final    Special Requests BOTTLES DRAWN AEROBIC AND ANAEROBIC 5CC   Final    Culture NO GROWTH 5 DAYS   Final    Report Status 02/04/2012 FINAL   Final   CULTURE, BLOOD (ROUTINE X 2)     Status: Normal (Preliminary result)   Collection Time   02/16/12  4:24 AM      Component Value Range Status Comment   Specimen Description Blood PICC LINE   Final    Special Requests     Final    Value: BOTTLES DRAWN AEROBIC AND ANAEROBIC DRAWN BY RN [facility]   Culture PENDING    Incomplete    Report Status PENDING   Incomplete   CULTURE, BLOOD (ROUTINE X 2)     Status: Normal (Preliminary result)   Collection Time   02/16/12  4:24 AM      Component Value Range Status Comment   Specimen Description Blood BLOOD RIGHT HAND   Final    Special Requests BOTTLES DRAWN AEROBIC AND ANAEROBIC 6CC   Final    Culture PENDING   Incomplete    Report Status PENDING   Incomplete     Medical History: Past Medical History  Diagnosis Date  . Renal disorder   . Elective mutism   . Autism   . Hyperosmolar (nonketotic) coma   . Hyperglycemia   . Diabetes mellitus   . Hyponatremia   . Mental retardation   . Urinary retention   . BPH (benign prostatic hyperplasia)   . Gastroparesis   . Depression   . Pneumonia   . UTI (urinary tract infection)   . Mental retardation     Medications:  Scheduled:    . albuterol  2.5 mg Nebulization BID   And  . ipratropium  0.5 mg Nebulization BID  . amLODipine  5 mg Oral Daily  . antiseptic oral rinse  15 mL Mouth Rinse QID  . chlorhexidine  15 mL Mouth Rinse BID  . dextrose      . dextrose      . dextrose      . dextrose      . dextrose      . enoxaparin (LOVENOX) injection  30 mg Subcutaneous Q24H  . famotidine  20 mg Oral Daily  . feeding supplement  30 mL Oral TID WC  . insulin aspart  0-9 Units Subcutaneous Q4H  . lisinopril  10 mg Oral Daily  . magnesium sulfate LVP 250-500 ml  2 g Intravenous Once  . pantoprazole  40 mg Oral Q1200  . piperacillin-tazobactam (ZOSYN)  IV  3.375 g Intravenous Q8H  . polyethylene glycol  17 g Oral q morning - 10a  . sertraline  150 mg Oral Daily  . sodium chloride  1,000 mL Intravenous Once  . sodium chloride  10-40 mL Intracatheter  Q12H  . Tamsulosin HCl  0.4 mg Oral Daily  . traZODone  50 mg Oral QHS  . vancomycin  1,000 mg Intravenous Q24H  . ziprasidone  40 mg Oral BID WC  . DISCONTD: insulin aspart  0-15 Units Subcutaneous Q4H  . DISCONTD: insulin NPH  5 Units Subcutaneous Q8H    . DISCONTD: insulin NPH  8 Units Subcutaneous Q8H   Assessment: Reduced renal function CrCl 32 ml/min  Goal of Therapy:  Vancomycin trough level 15-20 mcg/ml  Plan:  Zosyn 3.375 GM IV every 8 hours Vancomycin 1 GM IV every 24 hours Labs per protocol Measure antibiotic drug levels at steady state  [name] 02/16/2012,7:44 AM

## 2012-02-16 NOTE — Progress Notes (Signed)
NAMEJAHARRI, MLYNARCZYK           ACCOUNT NO.:  [id number]  MEDICAL RECORD NO.:  [id number]  LOCATION:  IC07                          FACILITY:  [facility]  PHYSICIAN:  Unk Lightning, MDDATE OF BIRTH:  Jul 07, 1961  DATE OF PROCEDURE: DATE OF DISCHARGE:                                PROGRESS NOTE   Procalcitonin this morning was elevated 2.6.  Lactic acid still pending. The patient was given 1000 mL of fluids, systolic improved to 86 from 72.  CVP is also at 8.  Plan right now is to administer another 1000 mL IV fluid bolus and to maintain CVP above 12.  If pressure does not come up, we will consider Levophed for hemodynamic control in augmenting blood pressure.  We will employ E-link physicians if pressure does not continue to rise with additional fluid resuscitation.     Unk Lightning, MD     RMD/MEDQ  D:  02/16/2012  T:  02/16/2012  Job:  [id number]

## 2012-02-16 NOTE — Progress Notes (Signed)
[id number] 

## 2012-02-16 NOTE — Progress Notes (Signed)
[place] Progress Note Patient Name: [name] DOB: Oct 21, 1960 MRN: [id number]  Date of Service  02/16/2012   HPI/Events of Note    Lab 02/16/12 0425 02/15/12 2059 02/15/12 1554 02/15/12 0602 02/14/12 0606 02/13/12 1904 02/13/12 0431  NA 141 -- -- 135 150* 150* 154*  K 3.8 -- -- 5.4* -- -- --  CL 104 -- -- 101 111 110 110  CO2 26 -- -- 26 30 31  36*  GLUCOSE 184* 112* 548* 433* 231* -- --  BUN 54* -- -- 43* 54* 66* 78*  CREATININE 2.55* -- -- 1.98* 2.06* 2.05* 2.23*  CALCIUM 7.8* -- -- 7.7* 8.3* 8.5 8.7  MG 1.6 -- -- -- -- -- --  PHOS 4.1 -- -- -- -- -- --   CK 1111  eICU Interventions  Fluid bolus 1L for posibly mild rhabdo Replete mag sulfate   Intervention Category Intermediate Interventions: Electrolyte abnormality - evaluation and management  Yassin Scales 02/16/2012, 5:51 AM

## 2012-02-17 LAB — CARDIAC PANEL(CRET KIN+CKTOT+MB+TROPI): Troponin I: <0.3 ng/mL (ref <0.30)

## 2012-02-17 LAB — GLUCOSE, CAPILLARY
Glucose-Capillary: 223 mg/dL — ABNORMAL HIGH (ref 70–99)
Glucose-Capillary: 217 mg/dL — ABNORMAL HIGH (ref 70–99)
Glucose-Capillary: 152 mg/dL — ABNORMAL HIGH (ref 70–99)

## 2012-02-17 LAB — DIFFERENTIAL
Basophils Absolute: 0 10*3/uL (ref 0.0–0.1)
Basophils Relative: 0 % (ref 0–1)
Eosinophils Absolute: 0.3 10*3/uL (ref 0.0–0.7)
Neutro Abs: 18.3 10*3/uL — ABNORMAL HIGH (ref 1.7–7.7)
Neutrophils Relative %: 87 % — ABNORMAL HIGH (ref 43–77)

## 2012-02-17 LAB — BLOOD GAS, ARTERIAL
Bicarbonate: 20.8 mEq/L (ref 20.0–24.0)
O2 Content: 3 L/min
O2 Saturation: 96.9 %
Patient temperature: 37
pH, Arterial: 7.397 (ref 7.350–7.450)
pO2, Arterial: 84.6 mmHg (ref 80.0–100.0)

## 2012-02-17 LAB — URINE CULTURE
Colony Count: 85000
Culture  Setup Time: 201306152024

## 2012-02-17 LAB — IRON AND TIBC
Iron: <10 ug/dL — ABNORMAL LOW (ref 42–135)
UIBC: 155 ug/dL (ref 125–400)

## 2012-02-17 LAB — BASIC METABOLIC PANEL
GFR calc Af Amer: 37 mL/min — ABNORMAL LOW (ref >90)
GFR calc non Af Amer: 32 mL/min — ABNORMAL LOW (ref >90)
Potassium: 3.2 mEq/L — ABNORMAL LOW (ref 3.5–5.1)
Sodium: 140 mEq/L (ref 135–145)

## 2012-02-17 LAB — HEMOGLOBIN AND HEMATOCRIT, BLOOD: Hemoglobin: 6.5 g/dL — CL (ref 13.0–17.0)

## 2012-02-17 LAB — AMMONIA: Ammonia: 21 umol/L (ref 11–60)

## 2012-02-17 LAB — CBC
Hemoglobin: 6.7 g/dL — CL (ref 13.0–17.0)
Platelets: 182 10*3/uL (ref 150–400)
RBC: 2.51 MIL/uL — ABNORMAL LOW (ref 4.22–5.81)

## 2012-02-17 LAB — HEPATIC FUNCTION PANEL
ALT: 28 U/L (ref 0–53)
AST: 51 U/L — ABNORMAL HIGH (ref 0–37)
Indirect Bilirubin: 0.3 mg/dL (ref 0.3–0.9)
Total Protein: 4.9 g/dL — ABNORMAL LOW (ref 6.0–8.3)

## 2012-02-17 LAB — MAGNESIUM: Magnesium: 1.8 mg/dL (ref 1.5–2.5)

## 2012-02-17 LAB — FERRITIN: Ferritin: 898 ng/mL — ABNORMAL HIGH (ref 22–322)

## 2012-02-17 MED ORDER — POTASSIUM CHLORIDE 10 MEQ/100ML IV SOLN
INTRAVENOUS | Status: AC
  Filled 2012-02-17: qty 300

## 2012-02-17 MED ORDER — POTASSIUM CHLORIDE 10 MEQ/100ML IV SOLN
10.0000 meq | INTRAVENOUS | Status: AC
  Administered 2012-02-17 (×3): 10 meq via INTRAVENOUS

## 2012-02-17 MED ORDER — INSULIN ASPART 100 UNIT/ML ~~LOC~~ SOLN
0.0000 [IU] | SUBCUTANEOUS | Status: DC
  Administered 2012-02-17: 5 [IU] via SUBCUTANEOUS
  Administered 2012-02-18 (×2): 0 [IU] via SUBCUTANEOUS

## 2012-02-17 NOTE — Progress Notes (Signed)
[id number] 

## 2012-02-17 NOTE — Progress Notes (Signed)
[name]  MRN: [id number]  DOB/AGE: November 30, 1960 51 y.o.  Primary Care Physician:FANTA,TESFAYE, MD  Admit date: 01/21/2012  Chief Complaint:  Chief Complaint  Patient presents with  . Hypoglycemia    S-Pt presented on  01/21/2012 with  Chief Complaint  Patient presents with  . Hypoglycemia  .   Pt offers NO complaints.Pt more alert than yesterday.   Meds    . albuterol  2.5 mg Nebulization BID   And  . ipratropium  0.5 mg Nebulization BID  . antiseptic oral rinse  15 mL Mouth Rinse QID  . chlorhexidine  15 mL Mouth Rinse BID  . dextrose      . dextrose      . dextrose      . enoxaparin (LOVENOX) injection  30 mg Subcutaneous Q24H  . famotidine  20 mg Oral Daily  . feeding supplement  30 mL Oral TID WC  . insulin aspart  0-9 Units Subcutaneous Q6H  . insulin NPH  5 Units Subcutaneous Q8H  . pantoprazole  40 mg Oral Q1200  . piperacillin-tazobactam (ZOSYN)  IV  3.375 g Intravenous Q8H  . polyethylene glycol  17 g Oral q morning - 10a  . potassium chloride  10 mEq Intravenous Q1 Hr x 3  . sertraline  150 mg Oral Daily  . sodium chloride  1,000 mL Intravenous Once  . sodium chloride  1,000 mL Intravenous Once  . sodium chloride  10-40 mL Intracatheter Q12H  . Tamsulosin HCl  0.4 mg Oral Daily  . traZODone  50 mg Oral QHS  . vancomycin  1,000 mg Intravenous Q24H  . ziprasidone  40 mg Oral BID WC  . DISCONTD: insulin aspart  0-9 Units Subcutaneous Q4H  . DISCONTD: insulin aspart  0-9 Units Subcutaneous Q6H          Physical Exam: Vital signs in last 24 hours: Temp:  [97.6 F (36.4 C)-100.2 F (37.9 C)] 98.9 F (37.2 C) (06/16 0800) Pulse Rate:  [67-90] 73  (06/16 1000) Resp:  [16-29] 21  (06/16 1000) BP: (84-140)/(38-103) 132/90 mmHg (06/16 1000) SpO2:  [99 %-100 %] 99 % (06/16 1000) Weight:  [150 lb 5.7 oz (68.2 kg)] 150 lb 5.7 oz (68.2 kg) (06/16 0500) Weight change: 5 lb 1.1 oz (2.3 kg) Last BM Date: 02/17/12  Intake/Output from previous  day: 06/15 0701 - 06/16 0700 In: 4956.7 [I.V.:2340; NG/GT:150; IV Piggyback:2466.7] Out: 1275 [Urine:1275] Total I/O In: 675 [I.V.:375; NG/GT:250; IV Piggyback:50] Out: -    Physical Exam: General- non verbal , more alert than yesterday Resp-NO resp distress, Minimal rhonchi( better than yesterday) CVS- S1S2 regular inrate and rhythm  GIT- BS+, soft, NT, ND  EXT- NO LE Edema, Cyanosis    Lab Results: CBC  Basename 02/17/12 0550 02/17/12 0449 02/16/12 0425  WBC -- 21.0* 24.5*  HGB 6.5* 6.7* --  HCT 20.5* 21.1* --  PLT -- 182 207    BMET  Basename 02/17/12 0449 02/16/12 0425  NA 140 141  K 3.2* 3.8  CL 109 104  CO2 22 26  GLUCOSE 294* 184*  BUN 44* 54*  CREATININE 2.25* 2.55*  CALCIUM 7.3* 7.8*    MICRO Recent Results (from the past 240 hour(s))  CULTURE, BLOOD (ROUTINE X 2)     Status: Normal (Preliminary result)   Collection Time   02/16/12  4:24 AM      Component Value Range Status Comment   Specimen Description Blood PICC LINE   Final  Special Requests     Final    Value: BOTTLES DRAWN AEROBIC AND ANAEROBIC DRAWN BY RN 6CC   Culture NO GROWTH 1 DAY   Final    Report Status PENDING   Incomplete   CULTURE, BLOOD (ROUTINE X 2)     Status: Normal (Preliminary result)   Collection Time   02/16/12  4:24 AM      Component Value Range Status Comment   Specimen Description Blood BLOOD RIGHT HAND   Final    Special Requests BOTTLES DRAWN AEROBIC AND ANAEROBIC 6CC   Final    Culture NO GROWTH 1 DAY   Final    Report Status PENDING   Incomplete       Lab Results  Component Value Date   CALCIUM 7.3* 02/17/2012   CAION 1.25 12/04/2011   PHOS 4.1 02/16/2012        Impression: 1)Renal  AKI secondary to Prerenal/ ATN  AKI secondary to Hypotension+ sepsis + ACE  AKI on CKD Most likely another episode of AKI from sepsis/Hypotension.  Earlier AKI- NON OLiguric ATN  Creat now at 2.25 -back to his baseline  CKD stage 3 .  CKD since 2011  CKD secondary to  HTN/ DM  Progression of CKD -Marked with AKI   2)CVS- Hemodynamically stable               Better than before              Responded to IVF Not on Pressors  3)Anemia HGb NOT at goal (9--11)  Transfusion PRN as per primary team GI work up-as per Primary team   4)ID SEpsis  ON ABX   5)DM- Uncontrolled  Being managed by Primary team /Endo.   6)Hypernatremia-  Sodium now at goal  Pt earlier had HypoNatremia secondary to Hypovolemia + SSRI   7)Acid base  Co2 at goal   8) CNS- Metabolic Encephalopathy   9)Hypokalmia- Being replaced    Plan:   Agree with current tx and plan Will follow.      Stephanye Finnicum S 02/17/2012, 10:46 AM

## 2012-02-18 LAB — BASIC METABOLIC PANEL
GFR calc Af Amer: 46 mL/min — ABNORMAL LOW (ref >90)
GFR calc non Af Amer: 40 mL/min — ABNORMAL LOW (ref >90)
Glucose, Bld: 135 mg/dL — ABNORMAL HIGH (ref 70–99)
Potassium: 3.1 mEq/L — ABNORMAL LOW (ref 3.5–5.1)
Sodium: 145 mEq/L (ref 135–145)

## 2012-02-18 LAB — DIFFERENTIAL
Basophils Absolute: 0 10*3/uL (ref 0.0–0.1)
Lymphocytes Relative: 7 % — ABNORMAL LOW (ref 12–46)
Neutro Abs: 15.1 10*3/uL — ABNORMAL HIGH (ref 1.7–7.7)

## 2012-02-18 LAB — GLUCOSE, CAPILLARY
Glucose-Capillary: 397 mg/dL — ABNORMAL HIGH (ref 70–99)
Glucose-Capillary: 249 mg/dL — ABNORMAL HIGH (ref 70–99)
Glucose-Capillary: 146 mg/dL — ABNORMAL HIGH (ref 70–99)

## 2012-02-18 LAB — CBC
HCT: 27.5 % — ABNORMAL LOW (ref 39.0–52.0)
Hemoglobin: 8.9 g/dL — ABNORMAL LOW (ref 13.0–17.0)
WBC: 17.4 10*3/uL — ABNORMAL HIGH (ref 4.0–10.5)

## 2012-02-18 LAB — HEPATIC FUNCTION PANEL
AST: 40 U/L — ABNORMAL HIGH (ref 0–37)
Albumin: 1.8 g/dL — ABNORMAL LOW (ref 3.5–5.2)
Alkaline Phosphatase: 101 U/L (ref 39–117)
Total Bilirubin: 1.1 mg/dL (ref 0.3–1.2)

## 2012-02-18 MED ORDER — KCL IN DEXTROSE-NACL 40-5-0.9 MEQ/L-%-% IV SOLN
INTRAVENOUS | Status: DC
  Administered 2012-02-18 – 2012-02-19 (×3): via INTRAVENOUS
  Administered 2012-02-19: 1000 mL via INTRAVENOUS
  Filled 2012-02-18 (×7): qty 1000

## 2012-02-18 MED ORDER — INSULIN ASPART 100 UNIT/ML ~~LOC~~ SOLN
0.0000 [IU] | SUBCUTANEOUS | Status: DC
  Administered 2012-02-18: 3 [IU] via SUBCUTANEOUS
  Administered 2012-02-18: 9 [IU] via SUBCUTANEOUS
  Administered 2012-02-19: 3 [IU] via SUBCUTANEOUS
  Administered 2012-02-19 (×2): 2 [IU] via SUBCUTANEOUS
  Administered 2012-02-19: 5 [IU] via SUBCUTANEOUS
  Administered 2012-02-20: 7 [IU] via SUBCUTANEOUS
  Administered 2012-02-20 (×2): 1 [IU] via SUBCUTANEOUS
  Administered 2012-02-20 (×2): 3 [IU] via SUBCUTANEOUS
  Administered 2012-02-21 (×3): 2 [IU] via SUBCUTANEOUS
  Administered 2012-02-21: 1 [IU] via SUBCUTANEOUS
  Administered 2012-02-22: 2 [IU] via SUBCUTANEOUS
  Administered 2012-02-22: 5 [IU] via SUBCUTANEOUS

## 2012-02-18 NOTE — Progress Notes (Signed)
Pt has had a significant decline in medical status and now in ICU.  We will d/c current PT orders.  Please reconsult whenever appropriate.

## 2012-02-18 NOTE — Clinical Social Work Note (Signed)
Pt moved to ICU over weekend.  He remains on IV antibiotics. CSW updated [facility] who report bed is still available at this time.  Message left with [name] at [facility] to discuss situation.    [name], [place]

## 2012-02-18 NOTE — Progress Notes (Signed)
Speech Language Pathology Dysphagia Treatment Patient Details Name: [name] MRN: [id number] DOB: 11/10/1960 Today's Date: 02/18/2012 Time: 42-     Assessment / Plan / Recommendation Clinical Impression  significant change in status this date vs last SLP note, NG tube has been reinstated and patient with writing movements of head and neck and shook head yes to are you in pain.  Nusring reports has had Tylenol and Morphine.  Patient took 3 sips HTL via teaspoon with poor lip closure, constant head and neck movements , no cough but facial grimace, took 1 bite puree with spillage, decreased clearance  and facial grimace.  per MBS decreased pharyngeal closure with NG in place and improved swallow function with NG removed.  As NG is now replaced, patient appears at higher risk for aspriration .  Recommend NPO at this time as patient  has declined in function.  If po given should be HTL via teaspoon with double swallow following strictly.  Reassess when NG removed or  condition improved .  Discussion with RN    Diet Recommendation  Continue with Current Diet: Other (comment) (NPO if condition does not improve) Initiate / Change Diet: Other (comment) (if po given HTL via teaspoon with double swallow )    SLP Plan Other (Comment) (reassess when condition stabilized)   Pertinent Vitals/Pain      Has fever,  Facial grimacing, writhing movements    RN has given pain med    Swallowing Goals  SLP Swallowing Goals Patient will consume recommended diet without observed clinical signs of aspiration with: Maximal cueing (increased s/s this date   fever) Swallow Study Goal #1 - Progress: Other (comment) (overall decline in function this date) Patient will utilize recommended strategies during swallow to increase swallowing safety with: Maximal cueing;Other (comment) (unable to utilize )  General Respiratory Status: Supplemental O2 delivered via (comment) Behavior/Cognition: Doesn't follow  directions;Other (comment) (writhing movements) Patient Positioning: Upright in bed  Oral Cavity - Oral Hygiene Does patient have any of the following "at risk" factors?: Diet - patient on thickened liquids;Other - dysphagia;Oxygen therapy - cannula, mask, simple oxygen devices Brush patient's teeth BID with toothbrush (using toothpaste with fluoride): Yes   Dysphagia Treatment Treatment focused on: Skilled observation of diet tolerance;Patient/family/caregiver education Treatment Methods/Modalities: Skilled observation Patient observed directly with PO's: Yes Type of PO's observed: Dysphagia 1 (puree);Honey-thick liquids Feeding: Total assist Liquids provided via: Teaspoon Oral Phase Signs & Symptoms: Anterior loss/spillage;Other (comment) (poor lip closure, constant movements of head) Pharyngeal Phase Signs & Symptoms: Suspected delayed swallow initiation Type of cueing: Verbal;Tactile Amount of cueing: Maximal   Pollyann Glen 02/18/2012, 1:57 PM

## 2012-02-18 NOTE — Progress Notes (Signed)
NAME:  [name], [name]           ACCOUNT NO.:  [id number]  MEDICAL RECORD NO.:  [id number]  LOCATION:                                 FACILITY:  PHYSICIAN:  Unk Lightning, MDDATE OF BIRTH:  01-10-1961  DATE OF PROCEDURE:  02/17/2012 DATE OF DISCHARGE:                                PROGRESS NOTE   The patient had significant hypotension systolic of [id number] yesterday, clinically septic with elevated procalcitonin, volume depleted, acute on chronic renal failure, worsening pneumonia, mental retardation, was given aggressive fluid resuscitation yesterday.  He also has rhabdomyolysis with CKs in greater than a 1000 with normal troponins and anemia which is either worsening or partially lower due to dilutional effect with hemoglobin of 6.5 this morning.  Blood pressure is 113/49 and 132/74 on the monitor, temperature 98.9, pulse is 71 and sinus, respiratory rate is 21, O2 sat is 100%.  Blood gases; pH 7.39, pCO2 of 34.5, pO2 is 84.6.  Hemoglobin and white count is down to 21,000 from 24, hemoglobin 6.5.  Renal function improved from 2.55 to 2.25 creatinine, potassium low at 3.2.  Neck showed no JVD.  The patient is alert, in no respiratory distress.  Lungs show scattered rhonchi.  No wheeze, no rales appreciable, some diminished breath sounds at the bases.  Heart regular rhythm.  No S3, S4.  No heaves, thrills, or rubs. Abdomen is essentially benign.  PLAN:  Right now is to add KCl 10 mEq and 3 runs of this, transfused 2 units of packed cells.  Check stools for occult blood daily.  Continue vanc and Zosyn which was started yesterday.  Blood cultures and urine cultures are negative thus far.  Lactic acid was 2.2 within normal limits yesterday with an elevated procalcitonin.  We will continue aggressive fluid resuscitation for rhabdomyolysis dual antibiotics, monitoring electrolytes, transfusion of 2 units of packed cells, and the patient appears remarkably clinically improved  from catastrophic situation yesterday.     Unk Lightning, MD     RMD/MEDQ  D:  02/17/2012  T:  02/17/2012  Job:  [id number]

## 2012-02-18 NOTE — Plan of Care (Signed)
Problem: Phase III Progression Outcomes Goal: Other Phase III Outcomes/Goals Outcome: Not Progressing Glucerna snack of approximately 60cc thickened shake (using thickener) given by teaspoon.  All aspiration precautions observed.  Did not feel patient aspirated but coughing did ensue.  Rhonchi to upper lobes noted prior to feeding and also after, no change.  Not sure of cough from pneumonia or aspiration.  NGT is an annoyance to him. After 60 cc, patient did not want anymore, shook head and resisted.

## 2012-02-18 NOTE — Progress Notes (Signed)
NAME:  [name], [name]           ACCOUNT NO.:  [id number]  MEDICAL RECORD NO.:  [id number]  LOCATION:  IC07                          FACILITY:  [facility]  PHYSICIAN:  Jataya Wann D. Legrand Rams, MD   DATE OF BIRTH:  1961/08/05  DATE OF PROCEDURE:  02/18/2012 DATE OF DISCHARGE:                                PROGRESS NOTE   SUBJECTIVE:  The patient is not verbally able to communicate.  He is back to ICU after he developed hypoglycemia and hypotension.  OBJECTIVE:  GENERAL:  The patient is opening his eyes.  He remained sick- looking.  Currently, he is on a combination of IV antibiotics and being monitored in the ICU. VITAL SIGNS:  Blood pressure 106/48, pulse 66, respiratory rate 16, temperature 97 degrees Fahrenheit. CHEST:  Poor air entry, bilateral rhonchi. CARDIOVASCULAR SYSTEM:  First and second heart sound heard.  No murmur. No gallop. ABDOMEN:  Soft and lax.  Bowel sounds are positive.  No mass or organomegaly. EXTREMITIES:  No leg edema.  LABORATORY DATA:  Sodium 145, potassium 3.1, chloride 113, BUN 32, creatinine 1.89, carbon dioxide 22, glucose 135, calcium 7.2.  CBC:  WBC 17.4, hemoglobin 8.9, hematocrit 27.5, and platelets 195.  ASSESSMENT: 1. Recurrent sepsis with hypotension. 2. Acute on chronic renal failure. 3. Diabetes mellitus with episode of hypoglycemia. 4. History of recent respiratory failure and status post ventilatory     support. 5. History of pneumonia. 6. Mental retardation.  PLAN: 1. We will continue the patient on IV antibiotics. 2. We will continue fluid and electrolyte management according to     Nephrology recommendation.  The patient has a prolonged hospital     stay and remained unstable.  We will continue aggressive treatment     at this time.    Jovonta Levit D. Legrand Rams, MD    TDF/MEDQ  D:  02/18/2012  T:  02/18/2012  Job:  [id number]

## 2012-02-18 NOTE — Progress Notes (Signed)
ANTIBIOTIC CONSULT NOTE   Pharmacy Consult for Vancomycin and Zosyn Indication: pneumonia  No Known Allergies  Patient Measurements: Height: 5\' 10"  (177.8 cm) Weight: 153 lb 7 oz (69.6 kg) IBW/kg (Calculated) : 73   Vital Signs: Temp: 98.8 F (37.1 C) (06/17 0400) Temp src: Oral (06/17 0400) BP: 161/73 mmHg (06/17 0600) Pulse Rate: 78  (06/17 0600) Intake/Output from previous day: 06/16 0701 - 06/17 0700 In: 4637.5 [I.V.:2750; Blood:737.5; NG/GT:500; IV Piggyback:650] Out: 5000 [Urine:4800; Emesis/NG output:200] Intake/Output from this shift:    Labs:  Basename 02/18/12 0420 02/17/12 0550 02/17/12 0449 02/16/12 0425  WBC 17.4* -- 21.0* 24.5*  HGB 8.9* 6.5* 6.7* --  PLT 195 -- 182 207  LABCREA -- -- -- --  CREATININE 1.89* -- 2.25* 2.55*   Estimated Creatinine Clearance: 46 ml/min (by C-G formula based on Cr of 1.89). No results found for this basename: VANCOTROUGH:2,VANCOPEAK:2,VANCORANDOM:2,GENTTROUGH:2,GENTPEAK:2,GENTRANDOM:2,TOBRATROUGH:2,TOBRAPEAK:2,TOBRARND:2,AMIKACINPEAK:2,AMIKACINTROU:2,AMIKACIN:2, in the last 72 hours   Microbiology: Recent Results (from the past 720 hour(s))  URINE CULTURE     Status: Normal   Collection Time   01/21/12  3:36 PM      Component Value Range Status Comment   Specimen Description URINE, CATHETERIZED   Final    Special Requests NONE   Final    Culture  Setup Time [id number]   Final    Colony Count >=100,000 COLONIES/ML   Final    Culture     Final    Value: ENTEROCOCCUS SPECIES     YEAST   Report Status 01/25/2012 FINAL   Final    Organism ID, Bacteria ENTEROCOCCUS SPECIES   Final   MRSA PCR SCREENING     Status: Abnormal   Collection Time   01/22/12  9:35 AM      Component Value Range Status Comment   MRSA by PCR INVALID RESULTS, SPECIMEN SENT FOR CULTURE (*) NEGATIVE Final   MRSA CULTURE     Status: Normal   Collection Time   01/22/12  9:35 AM      Component Value Range Status Comment   Specimen Description NOSE NASAL  SWAB   Final    Special Requests NONE   Final    Culture     Final    Value: NO STAPHYLOCOCCUS AUREUS ISOLATED     Note: NOMRSA   Report Status 01/24/2012 FINAL   Final   CULTURE, BLOOD (ROUTINE X 2)     Status: Normal   Collection Time   01/24/12  4:59 PM      Component Value Range Status Comment   Specimen Description BLOOD RIGHT HAND   Final    Special Requests BOTTLES DRAWN AEROBIC AND ANAEROBIC [place]   Final    Culture NO GROWTH 5 DAYS   Final    Report Status 01/29/2012 FINAL   Final   CULTURE, BLOOD (ROUTINE X 2)     Status: Normal   Collection Time   01/24/12  5:03 PM      Component Value Range Status Comment   Specimen Description BLOOD LEFT HAND   Final    Special Requests BOTTLES DRAWN AEROBIC AND ANAEROBIC [place]   Final    Culture NO GROWTH 5 DAYS   Final    Report Status 01/29/2012 FINAL   Final   CLOSTRIDIUM DIFFICILE BY PCR     Status: Normal   Collection Time   01/27/12  9:00 PM      Component Value Range Status Comment   C difficile by pcr NEGATIVE  NEGATIVE Final   CULTURE, BLOOD (ROUTINE X 2)     Status: Normal   Collection Time   01/30/12 10:24 AM      Component Value Range Status Comment   Specimen Description BLOOD RIGHT HAND   Final    Special Requests BOTTLES DRAWN AEROBIC AND ANAEROBIC 6CC    Final    Culture NO GROWTH 5 DAYS   Final    Report Status 02/04/2012 FINAL   Final   CULTURE, BLOOD (ROUTINE X 2)     Status: Normal   Collection Time   01/30/12 10:44 AM      Component Value Range Status Comment   Specimen Description BLOOD LEFT HAND   Final    Special Requests BOTTLES DRAWN AEROBIC AND ANAEROBIC 5CC   Final    Culture NO GROWTH 5 DAYS   Final    Report Status 02/04/2012 FINAL   Final   CULTURE, BLOOD (ROUTINE X 2)     Status: Normal (Preliminary result)   Collection Time   02/16/12  4:24 AM      Component Value Range Status Comment   Specimen Description Blood PICC LINE   Final    Special Requests     Final    Value: BOTTLES DRAWN AEROBIC AND  ANAEROBIC DRAWN BY RN 6CC   Culture NO GROWTH 1 DAY   Final    Report Status PENDING   Incomplete   CULTURE, BLOOD (ROUTINE X 2)     Status: Normal (Preliminary result)   Collection Time   02/16/12  4:24 AM      Component Value Range Status Comment   Specimen Description Blood BLOOD RIGHT HAND   Final    Special Requests BOTTLES DRAWN AEROBIC AND ANAEROBIC 6CC   Final    Culture NO GROWTH 1 DAY   Final    Report Status PENDING   Incomplete   URINE CULTURE     Status: Normal   Collection Time   02/16/12  4:33 AM      Component Value Range Status Comment   Specimen Description URINE, CATHETERIZED   Final    Special Requests NONE   Final    Culture  Setup Time [id number]   Final    Colony Count 85,000 COLONIES/ML   Final    Culture YEAST   Final    Report Status 02/17/2012 FINAL   Final    Medical History: Past Medical History  Diagnosis Date  . Renal disorder   . Elective mutism   . Autism   . Hyperosmolar (nonketotic) coma   . Hyperglycemia   . Diabetes mellitus   . Hyponatremia   . Mental retardation   . Urinary retention   . BPH (benign prostatic hyperplasia)   . Gastroparesis   . Depression   . Pneumonia   . UTI (urinary tract infection)   . Mental retardation    Medications:  Scheduled:     . albuterol  2.5 mg Nebulization BID   And  . ipratropium  0.5 mg Nebulization BID  . antiseptic oral rinse  15 mL Mouth Rinse QID  . chlorhexidine  15 mL Mouth Rinse BID  . enoxaparin (LOVENOX) injection  30 mg Subcutaneous Q24H  . famotidine  20 mg Oral Daily  . feeding supplement  30 mL Oral TID WC  . insulin aspart  0-9 Units Subcutaneous Q4H  . insulin NPH  5 Units Subcutaneous Q8H  . pantoprazole  40 mg Oral Q1200  .  piperacillin-tazobactam (ZOSYN)  IV  3.375 g Intravenous Q8H  . polyethylene glycol  17 g Oral q morning - 10a  . potassium chloride  10 mEq Intravenous Q1 Hr x 3  . potassium chloride      . sertraline  150 mg Oral Daily  . sodium chloride  10-40  mL Intracatheter Q12H  . Tamsulosin HCl  0.4 mg Oral Daily  . traZODone  50 mg Oral QHS  . vancomycin  1,000 mg Intravenous Q24H  . ziprasidone  40 mg Oral BID WC  . DISCONTD: insulin aspart  0-9 Units Subcutaneous Q6H   Assessment: SCr improved Estimated Creatinine Clearance: 46 ml/min (by C-G formula based on Cr of 1.89).  Goal of Therapy:  Vancomycin trough level 15-20 mcg/ml  Plan:  Zosyn 3.375 GM IV every 8 hours Vancomycin 1 GM IV every 24 hours Labs per protocol Measure antibiotic drug levels at steady state, tomorrow  [name], Wilhoit A 02/18/2012,8:30 AM

## 2012-02-18 NOTE — Progress Notes (Signed)
Pt alert this morning. Ate 95% of breakfast. Became febrile in afternoon, acting anxious, rolling head around, and grimacing. Tylenol 650 mg given po for fever @1219 . Morphine 2mg  given at 1220 for pain. Patient able to nod head "yes" and "no" to questions. Did not want to eat lunch. Pt still appeared anxious and uncomfortable, ativan 1mg  iv given at 1355. Patient slept until 1645 when supper tray arrived. Pt appeared calm, with minimal head movement, so  food offered. Patient ate one bite, then shook head "no," and was disinterested in everything offered. Blood cultures drawn 2 days ago show " no growth so far." Foley changed today due to leaking bag.

## 2012-02-18 NOTE — Progress Notes (Signed)
[name]  MRN: [id number]  DOB/AGE: 02-11-1961 51 y.o.  Primary Care Physician:FANTA,TESFAYE, MD  Admit date: 01/21/2012  Chief Complaint:  Chief Complaint  Patient presents with  . Hypoglycemia    S-Pt presented on  01/21/2012 with  Chief Complaint  Patient presents with  . Hypoglycemia  .   Pt offers NO complaints.   Pt more alert than before.  Meds    . albuterol  2.5 mg Nebulization BID   And  . ipratropium  0.5 mg Nebulization BID  . antiseptic oral rinse  15 mL Mouth Rinse QID  . chlorhexidine  15 mL Mouth Rinse BID  . enoxaparin (LOVENOX) injection  30 mg Subcutaneous Q24H  . famotidine  20 mg Oral Daily  . feeding supplement  30 mL Oral TID WC  . insulin aspart  0-9 Units Subcutaneous Q4H  . insulin NPH  5 Units Subcutaneous Q8H  . pantoprazole  40 mg Oral Q1200  . piperacillin-tazobactam (ZOSYN)  IV  3.375 g Intravenous Q8H  . polyethylene glycol  17 g Oral q morning - 10a  . potassium chloride      . sertraline  150 mg Oral Daily  . sodium chloride  10-40 mL Intracatheter Q12H  . Tamsulosin HCl  0.4 mg Oral Daily  . traZODone  50 mg Oral QHS  . vancomycin  1,000 mg Intravenous Q24H  . ziprasidone  40 mg Oral BID WC  . DISCONTD: insulin aspart  0-9 Units Subcutaneous Q6H  . DISCONTD: insulin aspart  0-9 Units Subcutaneous Q4H         [id number] from the symptoms mentioned above,there are no other symptoms referable to all systems reviewed.  Physical Exam: Vital signs in last 24 hours: Temp:  [97.6 F (36.4 C)-102.1 F (38.9 C)] 102.1 F (38.9 C) (06/17 1200) Pulse Rate:  [66-103] 90  (06/17 1400) Resp:  [17-28] 25  (06/17 1400) BP: (139-170)/(56-105) 139/56 mmHg (06/17 1400) SpO2:  [93 %-100 %] 93 % (06/17 1400) Weight:  [153 lb 7 oz (69.6 kg)] 153 lb 7 oz (69.6 kg) (06/17 0500) Weight change: 3 lb 1.4 oz (1.4 kg) Last BM Date: 02/17/12  Intake/Output from previous day: 06/16 0701 - 06/17 0700 In: 4762.5 [I.V.:2875; Blood:737.5;  NG/GT:500; IV Piggyback:650] Out: 5000 [Urine:4800; Emesis/NG output:200] Total I/O In: 370 [P.O.:120; I.V.:250] Out: 2250 [Urine:2250]   Physical Exam: General- pt is awake,alert, oriented to time place and person Resp- No acute REsp distress, decreased BS at bases. CVS- S1S2 regular in rate and rhythm GIT- BS+, soft, NT, ND EXT- NO LE Edema, Cyanosis   Lab Results: CBC  Basename 02/18/12 0420 02/17/12 0550 02/17/12 0449  WBC 17.4* -- 21.0*  HGB 8.9* 6.5* --  HCT 27.5* 20.5* --  PLT 195 -- 182    BMET  Basename 02/18/12 0420 02/17/12 0449  NA 145 140  K 3.1* 3.2*  CL 113* 109  CO2 22 22  GLUCOSE 135* 294*  BUN 32* 44*  CREATININE 1.89* 2.25*  CALCIUM 7.2* 7.3*    MICRO Recent Results (from the past 240 hour(s))  CULTURE, BLOOD (ROUTINE X 2)     Status: Normal (Preliminary result)   Collection Time   02/16/12  4:24 AM      Component Value Range Status Comment   Specimen Description Blood PICC LINE   Final    Special Requests     Final    Value: BOTTLES DRAWN AEROBIC AND ANAEROBIC DRAWN BY RN [facility]   Culture NO  GROWTH 2 DAYS   Final    Report Status PENDING   Incomplete   CULTURE, BLOOD (ROUTINE X 2)     Status: Normal (Preliminary result)   Collection Time   02/16/12  4:24 AM      Component Value Range Status Comment   Specimen Description Blood BLOOD RIGHT HAND   Final    Special Requests BOTTLES DRAWN AEROBIC AND ANAEROBIC 6CC   Final    Culture NO GROWTH 2 DAYS   Final    Report Status PENDING   Incomplete   URINE CULTURE     Status: Normal   Collection Time   02/16/12  4:33 AM      Component Value Range Status Comment   Specimen Description URINE, CATHETERIZED   Final    Special Requests NONE   Final    Culture  Setup Time [id number]   Final    Colony Count 85,000 COLONIES/ML   Final    Culture YEAST   Final    Report Status 02/17/2012 FINAL   Final       Lab Results  Component Value Date   CALCIUM 7.2* 02/18/2012   CAION 1.25 12/04/2011   PHOS  4.1 02/16/2012               Impression: 1)Renal  AKI secondary to Prerenal/ ATN  AKI secondary to Hypotension+ sepsis + ACE  AKI on CKD  Most likely another episode of AKI from sepsis/Hypotension.  Earlier AKI- NON OLiguric ATN  Creat now at 1.8-improving   CKD stage 3 .  CKD since 2011  CKD secondary to HTN/ DM  Progression of CKD -Marked with AKI   2)CVS- Hemodynamically stable  Better than before  Responded to IVF Not on Pressors   3)Anemia HGb NOT at goal (9--11)  Transfusion PRN as per primary team  GI work up-as per Primary team   4)ID SEpsis  ON ABX   5)DM- Uncontrolled  Being managed by Primary team /Endo.   6)Hypernatremia-  Sodium now at goal  Pt earlier had HypoNatremia secondary to Hypovolemia + SSRI   7)Acid base  Co2 at goal   8) CNS- Metabolic Encephalopathy   9)Hypokalmia- Being replaced   10) Hypocalcemia( 7.2 + 2.2 times 0.8=8.96-) Corrected calcium is within range.    Plan:  Will reduce IVf rate Will add kcl to IVF Will follow BMet     Pamila Mendibles S 02/18/2012, 3:08 PM

## 2012-02-19 ENCOUNTER — Other Ambulatory Visit: Payer: Self-pay | Admitting: Radiology

## 2012-02-19 DIAGNOSIS — T17308A Unspecified foreign body in larynx causing other injury, initial encounter: Secondary | ICD-10-CM

## 2012-02-19 DIAGNOSIS — R1310 Dysphagia, unspecified: Secondary | ICD-10-CM

## 2012-02-19 LAB — BASIC METABOLIC PANEL
BUN: 27 mg/dL — ABNORMAL HIGH (ref 6–23)
GFR calc non Af Amer: 46 mL/min — ABNORMAL LOW (ref >90)
Glucose, Bld: 284 mg/dL — ABNORMAL HIGH (ref 70–99)
Potassium: 3.4 mEq/L — ABNORMAL LOW (ref 3.5–5.1)

## 2012-02-19 LAB — HEPATIC FUNCTION PANEL
Bilirubin, Direct: 0.5 mg/dL — ABNORMAL HIGH (ref 0.0–0.3)
Indirect Bilirubin: 0.4 mg/dL (ref 0.3–0.9)
Total Bilirubin: 0.9 mg/dL (ref 0.3–1.2)

## 2012-02-19 LAB — TYPE AND SCREEN: Unit division: 0

## 2012-02-19 LAB — DIFFERENTIAL
Basophils Absolute: 0 10*3/uL (ref 0.0–0.1)
Eosinophils Relative: 3 % (ref 0–5)
Lymphocytes Relative: 9 % — ABNORMAL LOW (ref 12–46)
Lymphs Abs: 1.2 10*3/uL (ref 0.7–4.0)
Monocytes Absolute: 0.6 10*3/uL (ref 0.1–1.0)

## 2012-02-19 LAB — GLUCOSE, CAPILLARY: Glucose-Capillary: 121 mg/dL — ABNORMAL HIGH (ref 70–99)

## 2012-02-19 LAB — CBC
HCT: 29.1 % — ABNORMAL LOW (ref 39.0–52.0)
MCHC: 32.6 g/dL (ref 30.0–36.0)
MCV: 84.6 fL (ref 78.0–100.0)
RDW: 15.2 % (ref 11.5–15.5)

## 2012-02-19 LAB — VANCOMYCIN, TROUGH: Vancomycin Tr: 17.1 ug/mL (ref 10.0–20.0)

## 2012-02-19 MED ORDER — DEXTROSE 50 % IV SOLN
INTRAVENOUS | Status: AC
  Administered 2012-02-19: 50 mL
  Filled 2012-02-19: qty 50

## 2012-02-19 MED ORDER — ENOXAPARIN SODIUM 30 MG/0.3ML ~~LOC~~ SOLN
30.0000 mg | SUBCUTANEOUS | Status: DC
  Administered 2012-02-20 – 2012-02-22 (×3): 30 mg via SUBCUTANEOUS
  Filled 2012-02-19 (×3): qty 0.3

## 2012-02-19 MED ORDER — PANTOPRAZOLE SODIUM 40 MG PO TBEC
40.0000 mg | DELAYED_RELEASE_TABLET | Freq: Two times a day (BID) | ORAL | Status: DC
  Administered 2012-02-19: 40 mg via ORAL
  Filled 2012-02-19: qty 1

## 2012-02-19 NOTE — Consult Note (Addendum)
REVIEWED. AGREE.  PT AT RISK FOR PULLING TUBE OUT DUE TO AGITATION. WOULD PLACE ABDOMINAL BINDER ON TUBE AFTER IT IS PLACED. NEEDS BINDER OR HAND MITTENS FOR 6 WEEKS TO KEEP PT FROM PULLING TUBE OUT. IF PT PULLS OUT TUBE WILL NEED SURGERY TO CORRECT GASTRIC DEFECT AND WOULD NOT REPLACE. CALLED DISCUSS WITH POA: [phone]. UNABLE TO CONTACT AT PH# IN RECORD.

## 2012-02-19 NOTE — Progress Notes (Signed)
[name]  MRN: [id number]  DOB/AGE: 05-06-1961 51 y.o.  Primary Care Physician:FANTA,TESFAYE, MD  Admit date: 01/21/2012  Chief Complaint:  Chief Complaint  Patient presents with  . Hypoglycemia    S-Pt presented on  01/21/2012 with  Chief Complaint  Patient presents with  . Hypoglycemia  .  Pt looks better than before.  Pt not able to offers any complaints.   Meds   . albuterol  2.5 mg Nebulization BID   And  . ipratropium  0.5 mg Nebulization BID  . antiseptic oral rinse  15 mL Mouth Rinse QID  . chlorhexidine  15 mL Mouth Rinse BID  . enoxaparin (LOVENOX) injection  30 mg Subcutaneous Q24H  . famotidine  20 mg Oral Daily  . feeding supplement  30 mL Oral TID WC  . insulin aspart  0-9 Units Subcutaneous Q4H  . insulin NPH  5 Units Subcutaneous Q8H  . pantoprazole  40 mg Oral Q1200  . piperacillin-tazobactam (ZOSYN)  IV  3.375 g Intravenous Q8H  . polyethylene glycol  17 g Oral q morning - 10a  . sertraline  150 mg Oral Daily  . sodium chloride  10-40 mL Intracatheter Q12H  . Tamsulosin HCl  0.4 mg Oral Daily  . traZODone  50 mg Oral QHS  . vancomycin  1,000 mg Intravenous Q24H  . ziprasidone  40 mg Oral BID WC  . DISCONTD: insulin aspart  0-9 Units Subcutaneous Q4H         [id number] from the symptoms mentioned above,there are no other symptoms referable to all systems reviewed.  Physical Exam: Vital signs in last 24 hours: Temp:  [97.6 F (36.4 C)-102.1 F (38.9 C)] 98 F (36.7 C) (06/18 0730) Pulse Rate:  [67-96] 67  (06/18 0700) Resp:  [17-31] 22  (06/18 0700) BP: (137-173)/(56-136) 147/68 mmHg (06/18 0700) SpO2:  [90 %-97 %] 96 % (06/18 0740) Weight:  [151 lb 14.4 oz (68.9 kg)] 151 lb 14.4 oz (68.9 kg) (06/18 0531) Weight change: -1 lb 8.7 oz (-0.7 kg) Last BM Date: 02/19/12  Intake/Output from previous day: 06/17 0701 - 06/18 0700 In: 2125 [P.O.:180; I.V.:1615; NG/GT:120; IV Piggyback:150] Out: 6101 [Urine:6100; Stool:1] Total  I/O In: 220 [P.O.:120; I.V.:100] Out: -    Physical Exam: General- pt is awake,follows commands( much better than before) Resp- No acute REsp distress, Rhonchi at bases CVS- S1S2 regular in rate and rhythm GIT- BS+, soft, NT, ND EXT- NO LE Edema, Cyanosis   Lab Results: CBC  Basename 02/19/12 0448 02/18/12 0420  WBC 13.6* 17.4*  HGB 9.5* 8.9*  HCT 29.1* 27.5*  PLT 190 195    BMET  Basename 02/19/12 0448 02/18/12 0420  NA 144 145  K 3.4* 3.1*  CL 111 113*  CO2 22 22  GLUCOSE 284* 135*  BUN 27* 32*  CREATININE 1.69* 1.89*  CALCIUM 7.2* 7.2*    MICRO Recent Results (from the past 240 hour(s))  CULTURE, BLOOD (ROUTINE X 2)     Status: Normal (Preliminary result)   Collection Time   02/16/12  4:24 AM      Component Value Range Status Comment   Specimen Description BLOOD PICC LINE DRAWN BY RN   Final    Special Requests BOTTLES DRAWN AEROBIC AND ANAEROBIC 6CC   Final    Culture NO GROWTH 3 DAYS   Final    Report Status PENDING   Incomplete   CULTURE, BLOOD (ROUTINE X 2)     Status: Normal (Preliminary result)  Collection Time   02/16/12  4:24 AM      Component Value Range Status Comment   Specimen Description BLOOD RIGHT HAND   Final    Special Requests BOTTLES DRAWN AEROBIC AND ANAEROBIC 6CC   Final    Culture NO GROWTH 3 DAYS   Final    Report Status PENDING   Incomplete   URINE CULTURE     Status: Normal   Collection Time   02/16/12  4:33 AM      Component Value Range Status Comment   Specimen Description URINE, CATHETERIZED   Final    Special Requests NONE   Final    Culture  Setup Time [id number]   Final    Colony Count 85,000 COLONIES/ML   Final    Culture YEAST   Final    Report Status 02/17/2012 FINAL   Final       Lab Results  Component Value Date   CALCIUM 7.2* 02/19/2012   CAION 1.25 12/04/2011   PHOS 4.1 02/16/2012         Impression: 1)Renal  AKI secondary to Prerenal/ ATN  AKI secondary to Hypotension+ sepsis + ACE  AKI on CKD   Most likely another episode of AKI from sepsis/Hypotension.  Earlier AKI- NON OLiguric ATN  Creat now at 1.7-improving  CKD stage 3 .  CKD since 2011  CKD secondary to HTN/ DM  Progression of CKD -Marked with AKI   2)CVS- Hemodynamically stable  Better than before  Responded to IVF Not on Pressors   3)Anemia HGb NOT at goal (9--11)  Transfusion PRN as per primary team  GI work up-as per Primary team   4)ID SEpsis  ON ABX   5)DM- Uncontrolled  Being managed by Primary team /Endo.   6)Hypernatremia-  Sodium now at goal  Pt earlier had HypoNatremia secondary to Hypovolemia + SSRI   7)Acid base  Co2 at goal   8) CNS- Metabolic Encephalopathy-Better   9)Hypokalmia- Being replaced   10) Hypocalcemia( 7.2 + 2.2 times 0.8=8.96-) Corrected calcium is within range.      Plan:  Will continue current care      Teren Zurcher S 02/19/2012, 10:05 AM

## 2012-02-19 NOTE — Progress Notes (Signed)
Pt has been set up for insertion of PEG tube  By [facility] tomorrow 02/20/12 at 11am. .transportation via [facility] has been scheduled. Pt needs to be there by 1000am. [facility] to arrive at approx 0900am

## 2012-02-19 NOTE — Progress Notes (Signed)
ANTIBIOTIC CONSULT NOTE   Pharmacy Consult for Vancomycin and Zosyn Indication: pneumonia  No Known Allergies  Patient Measurements: Height: 5\' 10"  (177.8 cm) Weight: 151 lb 14.4 oz (68.9 kg) IBW/kg (Calculated) : 73   Vital Signs: Temp: 98 F (36.7 C) (06/18 0730) Temp src: Oral (06/18 0730) BP: 147/68 mmHg (06/18 0700) Pulse Rate: 67  (06/18 0700) Intake/Output from previous day: 06/17 0701 - 06/18 0700 In: 2125 [P.O.:180; I.V.:1615; NG/GT:120; IV Piggyback:150] Out: 6101 [Urine:6100; Stool:1] Intake/Output from this shift: Total I/O In: 220 [P.O.:120; I.V.:100] Out: -   Labs:  Basename 02/19/12 0448 02/18/12 0420 02/17/12 0550 02/17/12 0449  WBC 13.6* 17.4* -- 21.0*  HGB 9.5* 8.9* 6.5* --  PLT 190 195 -- 182  LABCREA -- -- -- --  CREATININE 1.69* 1.89* -- 2.25*   Estimated Creatinine Clearance: 51 ml/min (by C-G formula based on Cr of 1.69).  Basename 02/19/12 0847  VANCOTROUGH 17.1  VANCOPEAK --  VANCORANDOM --  GENTTROUGH --  GENTPEAK --  GENTRANDOM --  TOBRATROUGH --  TOBRAPEAK --  TOBRARND --  AMIKACINPEAK --  AMIKACINTROU --  AMIKACIN --    Microbiology: Recent Results (from the past 720 hour(s))  URINE CULTURE     Status: Normal   Collection Time   01/21/12  3:36 PM      Component Value Range Status Comment   Specimen Description URINE, CATHETERIZED   Final    Special Requests NONE   Final    Culture  Setup Time [id number]   Final    Colony Count >=100,000 COLONIES/ML   Final    Culture     Final    Value: ENTEROCOCCUS SPECIES     YEAST   Report Status 01/25/2012 FINAL   Final    Organism ID, Bacteria ENTEROCOCCUS SPECIES   Final   MRSA PCR SCREENING     Status: Abnormal   Collection Time   01/22/12  9:35 AM      Component Value Range Status Comment   MRSA by PCR INVALID RESULTS, SPECIMEN SENT FOR CULTURE (*) NEGATIVE Final   MRSA CULTURE     Status: Normal   Collection Time   01/22/12  9:35 AM      Component Value Range Status  Comment   Specimen Description NOSE NASAL SWAB   Final    Special Requests NONE   Final    Culture     Final    Value: NO STAPHYLOCOCCUS AUREUS ISOLATED     Note: NOMRSA   Report Status 01/24/2012 FINAL   Final   CULTURE, BLOOD (ROUTINE X 2)     Status: Normal   Collection Time   01/24/12  4:59 PM      Component Value Range Status Comment   Specimen Description BLOOD RIGHT HAND   Final    Special Requests BOTTLES DRAWN AEROBIC AND ANAEROBIC 8CC   Final    Culture NO GROWTH 5 DAYS   Final    Report Status 01/29/2012 FINAL   Final   CULTURE, BLOOD (ROUTINE X 2)     Status: Normal   Collection Time   01/24/12  5:03 PM      Component Value Range Status Comment   Specimen Description BLOOD LEFT HAND   Final    Special Requests BOTTLES DRAWN AEROBIC AND ANAEROBIC 8CC   Final    Culture NO GROWTH 5 DAYS   Final    Report Status 01/29/2012 FINAL   Final   CLOSTRIDIUM DIFFICILE BY  PCR     Status: Normal   Collection Time   01/27/12  9:00 PM      Component Value Range Status Comment   C difficile by pcr NEGATIVE  NEGATIVE Final   CULTURE, BLOOD (ROUTINE X 2)     Status: Normal   Collection Time   01/30/12 10:24 AM      Component Value Range Status Comment   Specimen Description BLOOD RIGHT HAND   Final    Special Requests BOTTLES DRAWN AEROBIC AND ANAEROBIC 6CC    Final    Culture NO GROWTH 5 DAYS   Final    Report Status 02/04/2012 FINAL   Final   CULTURE, BLOOD (ROUTINE X 2)     Status: Normal   Collection Time   01/30/12 10:44 AM      Component Value Range Status Comment   Specimen Description BLOOD LEFT HAND   Final    Special Requests BOTTLES DRAWN AEROBIC AND ANAEROBIC 5CC   Final    Culture NO GROWTH 5 DAYS   Final    Report Status 02/04/2012 FINAL   Final   CULTURE, BLOOD (ROUTINE X 2)     Status: Normal (Preliminary result)   Collection Time   02/16/12  4:24 AM      Component Value Range Status Comment   Specimen Description BLOOD PICC LINE DRAWN BY RN   Final    Special  Requests BOTTLES DRAWN AEROBIC AND ANAEROBIC 6CC   Final    Culture NO GROWTH 3 DAYS   Final    Report Status PENDING   Incomplete   CULTURE, BLOOD (ROUTINE X 2)     Status: Normal (Preliminary result)   Collection Time   02/16/12  4:24 AM      Component Value Range Status Comment   Specimen Description BLOOD RIGHT HAND   Final    Special Requests BOTTLES DRAWN AEROBIC AND ANAEROBIC 6CC   Final    Culture NO GROWTH 3 DAYS   Final    Report Status PENDING   Incomplete   URINE CULTURE     Status: Normal   Collection Time   02/16/12  4:33 AM      Component Value Range Status Comment   Specimen Description URINE, CATHETERIZED   Final    Special Requests NONE   Final    Culture  Setup Time [id number]   Final    Colony Count 85,000 COLONIES/ML   Final    Culture YEAST   Final    Report Status 02/17/2012 FINAL   Final    Medical History: Past Medical History  Diagnosis Date  . Renal disorder   . Elective mutism   . Autism   . Hyperosmolar (nonketotic) coma   . Hyperglycemia   . Diabetes mellitus   . Hyponatremia   . Mental retardation   . Urinary retention   . BPH (benign prostatic hyperplasia)   . Gastroparesis   . Depression   . Pneumonia   . UTI (urinary tract infection)   . Mental retardation    Medications:  Scheduled:     . albuterol  2.5 mg Nebulization BID   And  . ipratropium  0.5 mg Nebulization BID  . antiseptic oral rinse  15 mL Mouth Rinse QID  . chlorhexidine  15 mL Mouth Rinse BID  . enoxaparin (LOVENOX) injection  30 mg Subcutaneous Q24H  . famotidine  20 mg Oral Daily  . feeding supplement  30 mL Oral TID  WC  . insulin aspart  0-9 Units Subcutaneous Q4H  . insulin NPH  5 Units Subcutaneous Q8H  . pantoprazole  40 mg Oral Q1200  . piperacillin-tazobactam (ZOSYN)  IV  3.375 g Intravenous Q8H  . polyethylene glycol  17 g Oral q morning - 10a  . sertraline  150 mg Oral Daily  . sodium chloride  10-40 mL Intracatheter Q12H  . Tamsulosin HCl  0.4 mg  Oral Daily  . traZODone  50 mg Oral QHS  . vancomycin  1,000 mg Intravenous Q24H  . ziprasidone  40 mg Oral BID WC   Assessment: SCr improved Trough on target Estimated Creatinine Clearance: 51 ml/min (by C-G formula based on Cr of 1.69).  Goal of Therapy:  Vancomycin trough level 15-20 mcg/ml  Plan:  Continue Zosyn 3.375 GM IV every 8 hours  continue Vancomycin 1 GM IV every 24 hours Labs per protocol  Hart Robinsons A 02/19/2012,10:39 AM

## 2012-02-19 NOTE — Progress Notes (Signed)
NAME:  [name], [name]           ACCOUNT NO.:  [id number]  MEDICAL RECORD NO.:  [id number]  LOCATION:  IC07                          FACILITY:  [facility]  PHYSICIAN:  Asalee Barrette D. Legrand Rams, MD   DATE OF BIRTH:  1961/08/30  DATE OF PROCEDURE:  02/19/2012 DATE OF DISCHARGE:                                PROGRESS NOTE   SUBJECTIVE:  The patient is unable to communicate verbally.  He is not giving any history.  OBJECTIVE:  GENERAL:  The patient is more alert, awake, and is on oxygen today.  No fever or chills. VITAL SIGNS:  Blood pressure 173/70, pulse 103, respiratory rate 31, temperature 100.2 degrees Fahrenheit. CHEST:  Decreased air entry, few rhonchi. CARDIOVASCULAR:  First and second heart sounds heard.  No murmur.  No gallop. ABDOMEN:  Soft and lax.  Bowel sound is positive.  No mass or organomegaly. EXTREMITIES:  No leg edema.  LABORATORY DATA:  BMP; sodium 144, potassium 3.4, chloride 111, carbon dioxide 22, glucose 284, BUN 27, creatinine 1.69, and calcium 7.2.  ASSESSMENT: 1. Ongoing sepsis. 2. Acute on chronic renal failure, clinically improving. 3. History of bilateral pneumonia. 4. Diabetes mellitus. 5. Mental retardation. 6. Dysphagia. 7. Protein-calorie malnutrition.  PLAN:  We will continue the patient on IV antibiotics.  We will continue fluid and electrolyte according to Nephrology recommendation.  We will do GI consult for possible PEG tube placement.     Densil Ottey D. Legrand Rams, MD     TDF/MEDQ  D:  02/19/2012  T:  02/19/2012  Job:  [id number]

## 2012-02-19 NOTE — Consult Note (Signed)
Referring Provider: Dr. Benn Moulder Primary Care Physician:  Rosita Fire, MD Primary Gastroenterologist:  Dr. Barney Drain  Reason for Consultation:  Consider PEG Placement  HPI: [name] is a 51 y.o. male admitted w/ hx chronic kidney disease, DM, mentally challenged w/ respiratory failure requiring ventilator support.  We were consulted to consider PEG tube placement.  Pt has now been extubated & is doing well.  Pt has dysphagia & SLP evaluation incomplete due to poor pt compliance.  Pt nodding his head & refusing to eat.  Pt has had NG tube feeds & tolerated them well, although he seems to be bothered by the tube.  [employer], RN states pt has had a lot of aspiration & refuses to eat.  Frequent episodes of hypoglycemia.  Pt going to SNF after discharge.  Attempted to contact POA, Oletta Lamas w/ DSS @ [phone] & [facility] for return call.  Past Medical History  Diagnosis Date  . Renal disorder   . Elective mutism   . Autism   . Hyperosmolar (nonketotic) coma   . Hyperglycemia   . Diabetes mellitus   . Hyponatremia   . Mental retardation   . Urinary retention   . BPH (benign prostatic hyperplasia)   . Gastroparesis   . Depression   . Pneumonia   . UTI (urinary tract infection)   . Mental retardation     History reviewed. No pertinent past surgical history.  Prior to Admission medications   Medication Sig Start Date End Date Taking? Authorizing Provider  insulin aspart (NOVOLOG) 100 UNIT/ML injection Inject 1-6 Units into the skin 3 (three) times daily as needed. Per sliding scale instructions: If 150-200= 1 unit, 201-250= 2 units, 251-300= 3 units, 301-350= 4 units, 351-400= 5 units, Greater than 400= 6 units// in addition to Sliding Scale   Yes Historical Provider, MD  insulin glargine (LANTUS) 100 UNIT/ML injection Inject 30 Units into the skin at bedtime.    Yes Historical Provider, MD  lisinopril (PRINIVIL,ZESTRIL) 10 MG tablet Take 10 mg by mouth every morning.     Yes Historical Provider, MD  polyethylene glycol (MIRALAX / GLYCOLAX) packet Take 17 g by mouth every morning.    Yes Historical Provider, MD  ranitidine (ZANTAC) 150 MG capsule Take 150 mg by mouth 2 (two) times daily.     Yes Historical Provider, MD  sertraline (ZOLOFT) 50 MG tablet Take 150 mg by mouth every morning.    Yes Historical Provider, MD  sulfamethoxazole-trimethoprim (BACTRIM DS,SEPTRA DS) 800-160 MG per tablet Take 1 tablet by mouth every evening.   Yes Historical Provider, MD  Tamsulosin HCl (FLOMAX) 0.4 MG CAPS Take 0.4 mg by mouth every morning.    Yes Historical Provider, MD  traZODone (DESYREL) 50 MG tablet Take 50 mg by mouth at bedtime.     Yes Historical Provider, MD  ziprasidone (GEODON) 40 MG capsule Take 40 mg by mouth 2 (two) times daily with a meal.     Yes Historical Provider, MD  acetaminophen (TYLENOL) 325 MG tablet Take 650 mg by mouth every 6 (six) hours as needed. For pain    Historical Provider, MD  hyoscyamine (LEVSIN SL) 0.125 MG SL tablet Take 0.125-0.25 mg by mouth every 4 (four) hours as needed. Place one or two tablets under tongue every 4 hours if needed for relief of abdominal pain. 11/13/11   Evalee Jefferson, PA    Current Facility-Administered Medications  Medication Dose Route Frequency Provider Last Rate Last Dose  . acetaminophen (TYLENOL)  tablet 650 mg  650 mg Oral Q6H PRN Rosita Fire, MD   650 mg at 02/18/12 1219  . albuterol (PROVENTIL) (5 MG/ML) 0.5% nebulizer solution 2.5 mg  2.5 mg Nebulization Q2H PRN Alonza Bogus, MD   2.5 mg at 02/17/12 0428  . albuterol (PROVENTIL) (5 MG/ML) 0.5% nebulizer solution 2.5 mg  2.5 mg Nebulization BID Rosita Fire, MD   2.5 mg at 02/19/12 0737   And  . ipratropium (ATROVENT) nebulizer solution 0.5 mg  0.5 mg Nebulization BID Rosita Fire, MD   0.5 mg at 02/19/12 0738  . ALPRAZolam Duanne Moron) tablet 0.5 mg  0.5 mg Oral BID PRN Rosita Fire, MD   0.5 mg at 02/14/12 0030  . alum & mag hydroxide-simeth  (MAALOX/MYLANTA) 200-200-20 MG/5ML suspension 30 mL  30 mL Oral Q4H PRN Maricela Curet, MD   30 mL at 01/27/12 [id number]  . antiseptic oral rinse (BIOTENE) solution 15 mL  15 mL Mouth Rinse QID Alonza Bogus, MD   15 mL at 02/19/12 0355  . chlorhexidine (PERIDEX) 0.12 % solution 15 mL  15 mL Mouth Rinse BID Alonza Bogus, MD   15 mL at 02/19/12 0800  . dextrose 5 % and 0.9 % NaCl with KCl 40 mEq/L infusion   Intravenous Continuous Manpreet Toya Smothers, MD 100 mL/hr at 02/19/12 0800    . dextrose 50 % solution 25 mL  25 mL Intravenous PRN Rosita Fire, MD   25 mL at 02/04/12 1947  . dextrose 50 % solution 25 mL  25 mL Intravenous PRN Loni Beckwith, MD      . enoxaparin (LOVENOX) injection 30 mg  30 mg Subcutaneous Q24H Rosita Fire, MD   30 mg at 02/18/12 1758  . famotidine (PEPCID) tablet 20 mg  20 mg Oral Daily Rosita Fire, MD   20 mg at 02/18/12 1033  . feeding supplement (PRO-STAT SUGAR FREE 64) liquid 30 mL  30 mL Oral TID WC Frederik Schmidt, RD   30 mL at 02/19/12 0821  . hyoscyamine (LEVSIN SL) SL tablet 0.125-0.25 mg  0.125-0.25 mg Sublingual Q4H PRN Rosita Fire, MD      . insulin aspart (novoLOG) injection 0-9 Units  0-9 Units Subcutaneous Q4H Rosita Fire, MD   2 Units at 02/19/12 0820  . insulin NPH (HUMULIN N,NOVOLIN N) injection 5 Units  5 Units Subcutaneous Q8H Loni Beckwith, MD   5 Units at 02/19/12 0557  . LORazepam (ATIVAN) injection 1 mg  1 mg Intravenous Q1H PRN Alonza Bogus, MD   1 mg at 02/18/12 2312  . morphine 2 MG/ML injection 2 mg  2 mg Intravenous Q2H PRN Alonza Bogus, MD   2 mg at 02/19/12 0357  . ondansetron (ZOFRAN) injection 4 mg  4 mg Intravenous Q6H PRN Rosita Fire, MD   4 mg at 01/25/12 0429  . pantoprazole (PROTONIX) EC tablet 40 mg  40 mg Oral Q1200 Rosita Fire, MD   40 mg at 02/18/12 1152  . piperacillin-tazobactam (ZOSYN) IVPB 3.375 g  3.375 g Intravenous Q8H Angus Ailene Ravel, MD   3.375 g at 02/19/12 0125  . polyethylene glycol  (MIRALAX / GLYCOLAX) packet 17 g  17 g Oral q morning - 10a Tesfaye Fanta, MD   17 g at 02/15/12 1134  . sertraline (ZOLOFT) tablet 150 mg  150 mg Oral Daily Rosita Fire, MD   150 mg at 02/18/12 1033  . sodium chloride 0.9 % injection 10-40 mL  10-40 mL Intracatheter  Q12H Alonza Bogus, MD   10 mL at 02/18/12 2158  . sodium chloride 0.9 % injection 10-40 mL  10-40 mL Intracatheter PRN Alonza Bogus, MD   10 mL at 02/18/12 1350  . Tamsulosin HCl (FLOMAX) capsule 0.4 mg  0.4 mg Oral Daily Rosita Fire, MD   0.4 mg at 02/18/12 1033  . traZODone (DESYREL) tablet 50 mg  50 mg Oral QHS Rosita Fire, MD   50 mg at 02/18/12 2158  . vancomycin (VANCOCIN) IVPB 1000 mg/200 mL premix  1,000 mg Intravenous Q24H Lanette Hampshire, MD   1,000 mg at 02/18/12 1033  . ziprasidone (GEODON) capsule 40 mg  40 mg Oral BID WC Rosita Fire, MD   40 mg at 02/19/12 0823  . DISCONTD: dextrose 5 %-0.9 % sodium chloride infusion   Intravenous Continuous Liana Gerold, MD 125 mL/hr at 02/18/12 1402    . DISCONTD: insulin aspart (novoLOG) injection 0-9 Units  0-9 Units Subcutaneous Q4H Marlyce Huge, MD   0 Units at 02/18/12 0402    Allergies as of 01/21/2012  . (No Known Allergies)    No family history on file. Noncontributory.  History   Social History  . Marital Status: Single    Spouse Name: N/A    Number of Children: N/A  . Years of Education: N/A   Occupational History  . Not on file.   Social History Main Topics  . Smoking status: Never Smoker   . Smokeless tobacco: Not on file  . Alcohol Use: No  . Drug Use: No  . Sexually Active: Not on file   Review of Systems: Unable to obtain  Physical Exam: Vital signs in last 24 hours: Temp:  [97.6 F (36.4 C)-102.1 F (38.9 C)] 98 F (36.7 C) (06/18 0730) Pulse Rate:  [67-103] 67  (06/18 0700) Resp:  [17-31] 22  (06/18 0700) BP: (137-173)/(56-136) 147/68 mmHg (06/18 0700) SpO2:  [90 %-98 %] 96 % (06/18 0740) Weight:  [151 lb 14.4 oz  (68.9 kg)] 151 lb 14.4 oz (68.9 kg) (06/18 0531) Last BM Date: 02/19/12 General:   Alert, pleasant mentally challenged male in NAD. Head:  Normocephalic and atraumatic. Eyes:  Sclera clear, no icterus.   Conjunctiva pink. Ears:  Normal auditory acuity. Nose:  No deformity, discharge, or lesions.  NGT intact.   Mouth:  Dry.  Lips cracked. Neck:  Supple; no masses or thyromegaly. Lungs:  Decreased BS bilat.  +rhonchi bilat,  No acute distress. Heart:  Regular rate and rhythm; no murmurs, clicks, rubs,  or gallops. Abdomen:  Normal bowel sounds.  No bruits.  Soft, non-tender and non-distended without masses, hepatosplenomegaly or hernias noted.  No guarding or rebound tenderness.   Rectal:  Deferred. Pulses:  Normal pulses noted. Extremities:  No edema. Neurologic:  Alert, non-communicative.   Skin:  Intact without significant lesions or rashes. Lymph Nodes:  No significant cervical adenopathy. Psych:  Alert and cooperative.  Intake/Output from previous day: 06/17 0701 - 06/18 0700 In: 2125 [P.O.:180; I.V.:1615; NG/GT:120; IV Piggyback:150] Out: 6101 [Urine:6100; Stool:1] Intake/Output this shift: Total I/O In: 220 [P.O.:120; I.V.:100] Out: -   Lab Results:  Basename 02/19/12 0448 02/18/12 0420 02/17/12 0550 02/17/12 0449  WBC 13.6* 17.4* -- 21.0*  HGB 9.5* 8.9* 6.5* --  HCT 29.1* 27.5* 20.5* --  PLT 190 195 -- 182   BMET  Basename 02/19/12 0448 02/18/12 0420 02/17/12 0449  NA 144 145 140  K 3.4* 3.1* 3.2*  CL 111 113*  109  CO2 22 22 22   GLUCOSE 284* 135* 294*  BUN 27* 32* 44*  CREATININE 1.69* 1.89* 2.25*  CALCIUM 7.2* 7.2* 7.3*   LFT  Basename 02/19/12 0448 02/18/12 0420 02/17/12 0449  PROT 5.5* 5.4* 4.9*  ALBUMIN 1.8* 1.8* 1.8*  AST 29 40* 51*  ALT 26 30 28   ALKPHOS 94 101 80  BILITOT 0.9 1.1 0.8  BILIDIR 0.5* 0.5* 0.5*  IBILI 0.4 0.6 0.3  LIPASE -- -- --  AMYLASE -- -- --   Impression: [name] is a pleasant 51 y.o. male admitted w/  respiratory failure requiring ventilator support, multiple episodes of aspiration pneumonia.  Pt has been weaned off ventilator.  NGT intact.  Discussed w/ Dr Oneida Alar.  Due to respiratory status w/ persistent leukocytosis (PNA) on antibiotics, pt would be high-risk for conscious sedation, therefore pt not a candidate for endoscopic placement of PEG tube.  Plan: Dr Oneida Alar suggests placement of PEG tube via radiology at this time. PEG tube will not decrease risk of aspiration.  LOS: 29 days   Vickey Huger  02/19/2012, 8:59 AM [facility]

## 2012-02-19 NOTE — Progress Notes (Signed)
Ativan 1mg  iv, then #12 ng inserted in rt nare w/o difficulty. Barium prep given via ng as ordered for procedure in am. Pt tolerated well.

## 2012-02-19 NOTE — Progress Notes (Signed)
Orders placed for gastric tube placement at [facility] 02/20/12.  RN informed of orders placed. Patient to return to [facility] post procedure.

## 2012-02-19 NOTE — Progress Notes (Signed)
Attempted to feed this patient thickened water after he communicated he wanted something to drink. Used all aspiration precautions, using teaspoon,  patient again begins to cough as if unable to tolerate.  NGT is a constant annoyance to the patient.  Lung sounds remain the same as before feeding began and cough is dry.

## 2012-02-19 NOTE — Progress Notes (Signed)
Pt shaky and sweaty CBG checked revealed it to be 36. Hypoglycemia protocol followed. D50 IV given. Recheck in 15 mins.

## 2012-02-19 NOTE — Progress Notes (Signed)
Nutrition Follow-up  Diet Order:  Dys 1 Honey-thick liquids -continued poor appetite and meal intake 15% of breakfast and lunch today. PT scheduled for PEG placement in the morning.  Meds: Scheduled Meds:   . albuterol  2.5 mg Nebulization BID   And  . ipratropium  0.5 mg Nebulization BID  . antiseptic oral rinse  15 mL Mouth Rinse QID  . chlorhexidine  15 mL Mouth Rinse BID  . enoxaparin (LOVENOX) injection  30 mg Subcutaneous Q24H  . feeding supplement  30 mL Oral TID WC  . insulin aspart  0-9 Units Subcutaneous Q4H  . insulin NPH  5 Units Subcutaneous Q8H  . pantoprazole  40 mg Oral BID AC  . piperacillin-tazobactam (ZOSYN)  IV  3.375 g Intravenous Q8H  . polyethylene glycol  17 g Oral q morning - 10a  . sertraline  150 mg Oral Daily  . sodium chloride  10-40 mL Intracatheter Q12H  . Tamsulosin HCl  0.4 mg Oral Daily  . traZODone  50 mg Oral QHS  . vancomycin  1,000 mg Intravenous Q24H  . ziprasidone  40 mg Oral BID WC  . DISCONTD: enoxaparin (LOVENOX) injection  30 mg Subcutaneous Q24H  . DISCONTD: famotidine  20 mg Oral Daily  . DISCONTD: pantoprazole  40 mg Oral Q1200   Continuous Infusions:   . dextrose 5 % and 0.9 % NaCl with KCl 40 mEq/L 100 mL/hr at 02/19/12 1400  . DISCONTD: dextrose 5 % and 0.9% NaCl 125 mL/hr at 02/18/12 1402   PRN Meds:.acetaminophen, albuterol, ALPRAZolam, alum & mag hydroxide-simeth, dextrose, dextrose, hyoscyamine, LORazepam, morphine injection, ondansetron, sodium chloride  Labs:  CMP     Component Value Date/Time   NA 144 02/19/2012 0448   K 3.4* 02/19/2012 0448   CL 111 02/19/2012 0448   CO2 22 02/19/2012 0448   GLUCOSE 284* 02/19/2012 0448   BUN 27* 02/19/2012 0448   CREATININE 1.69* 02/19/2012 0448   CALCIUM 7.2* 02/19/2012 0448   PROT 5.5* 02/19/2012 0448   ALBUMIN 1.8* 02/19/2012 0448   AST 29 02/19/2012 0448   ALT 26 02/19/2012 0448   ALKPHOS 94 02/19/2012 0448   BILITOT 0.9 02/19/2012 0448   GFRNONAA 46* 02/19/2012 0448   GFRAA 53*  02/19/2012 0448     Intake/Output Summary (Last 24 hours) at 02/19/12 1550 Last data filed at 02/19/12 1400  Gross per 24 hour  Intake   3425 ml  Output   7051 ml  Net  -3626 ml    Weight Status: 151.14# (69kg) stable x 4 d.  Estimated needs:   1904-2040 kcal/day  95-105 gr/day  1 ml/kcal  Nutrition Dx: Inadequate oral intake slow to improve   Goal: Pt will consume >80% of est energy and protein needs. Goal not met   Interventions:   Pt scheduled to have PEG placed 02/20/12 @ [facility] interventional radiology.  Rec initiate Adult EN protocol  When pt is medically cleared to start with EN; rec continuous TF of Jevity 1.2 via PEG. Start @ 20 ml/hr advance 10 ml q 4h to goal rate of 60 ml/hr. At goal formula will supply 1728 kcal, 80 gr protein, 1162 ml water daily. Also, continue ProStat 30 ml BID (200 kcal, 30 gr protein)  via PEG if pt unable to take po.  Total Nutrition support provides: 1928 kcal, 110 gr protein daily which meets 100% est energy and protein requirements.  Will follow for nutritional needs.    Josanne Boerema, Landis Martins  Pager #  [phone]    

## 2012-02-19 NOTE — Progress Notes (Signed)
Pt pulled ng tube out. RN attempted  Several times to reinsert ng w/o success. Pt given iv morphine for agitation. RN will attempt again when pt has calmed down.

## 2012-02-19 NOTE — Progress Notes (Signed)
Received telephone consent from pt's Rae Halsted for pt to gave a PEG tube inserted at [facility] on 02/20/12. Telephone consent witnessed by  Talmage Nap CNPBL and Tamsen Meek RN.

## 2012-02-19 NOTE — Progress Notes (Signed)
[name], [name]           ACCOUNT NO.:  [id number]  MEDICAL RECORD NO.:  [id number]  LOCATION:  IC07                          FACILITY:  [facility]  PHYSICIAN:  Loni Beckwith, MD DATE OF BIRTH:  [birth year]  DATE OF PROCEDURE:  02/19/2012 DATE OF DISCHARGE:                                PROGRESS NOTE   REASON FOR FOLLOWUP:  Uncontrolled type 1 diabetes.  SUBJECTIVE:  The patient is now back to ICU, septic again, currently on IV antibiotics.  His oral intake is minimal.  Events have been reviewed. His glycemia is stable between 200 and 300, currently on NPH 5 units every 8 hours and NovoLog sliding scale.  OBJECTIVE:  GENERAL:  The patient is not in acute distress.  He has NG tube for suction. VITAL SIGNS:  Blood pressure is 147/68, pulse rate is 67, temperature 98. HEENT:  Well hydrated.  No JVD. CHEST:  Diffuse rhonchi. ABDOMEN:  Positive bowel sounds. EXTREMITIES:  No edema.  LABORATORY DATA:  Show sodium 144, potassium 3.4, chloride 111, bicarb 22, BUN 27, creatinine 1.69.  His glycemia ranged from 200 to 300.  ASSESSMENT: 1. Type 1 diabetes, chronically uncontrolled.  Recent A1c 8.0%. 2. Respiratory failure status post mechanical ventilation. 3. Sepsis. 4. Bilateral pneumonia status post therapy. 5. Poor oral intake. 6. Mental retardation.  PLAN:  For current acute management, NPH 5 units every 8 hours and NovoLog sensitive dose for correction is adequate.  The patient is unpredictable as far as nutritional intake is concerned, hence he is at risk for hypoglycemia as well as a diabetic ketoacidosis.  Long term, this patient will likely require a permanent feeding mechanism, preferably PEG tube.  I will return to re-evaluate, continue blood glucose monitoring every 6 hours, and one-on-one assistance with pureed diet at bedside.          ______________________________ Loni Beckwith, MD     GN/MEDQ  D:  02/19/2012  T:  02/19/2012  Job:  [id number]

## 2012-02-19 NOTE — Clinical Social Work Note (Signed)
CSW received bed offer from [facility].  [name] with DSS had requested [place] bed.  CSW notified [facility] of bed acceptance and notified Helene Kelp that guardian accepted another bed offer.  Pt to have PEG placed tomorrow. [facility] aware of above and notified of 90 day pasarr as well.   [name], [place]

## 2012-02-19 NOTE — Progress Notes (Signed)
Patient ID: [name], male   DOB: Dec 10, 1960, 51 y.o.   MRN: [id number]

## 2012-02-20 ENCOUNTER — Ambulatory Visit (HOSPITAL_COMMUNITY)
Admission: RE | Admit: 2012-02-20 | Discharge: 2012-02-20 | Disposition: A | Discharge status: Home / Self Care | Payer: Medicaid Other | Source: Ambulatory Visit

## 2012-02-20 ENCOUNTER — Ambulatory Visit (HOSPITAL_COMMUNITY)
Admission: RE | Admit: 2012-02-20 | Discharge: 2012-02-20 | Disposition: A | Discharge status: Home / Self Care | Payer: Medicaid Other | Source: Ambulatory Visit | Attending: Physician Assistant | Admitting: Physician Assistant

## 2012-02-20 ENCOUNTER — Encounter (HOSPITAL_COMMUNITY): Payer: Self-pay

## 2012-02-20 DIAGNOSIS — F79 Unspecified intellectual disabilities: Secondary | ICD-10-CM | POA: Insufficient documentation

## 2012-02-20 DIAGNOSIS — E119 Type 2 diabetes mellitus without complications: Secondary | ICD-10-CM | POA: Insufficient documentation

## 2012-02-20 DIAGNOSIS — N400 Enlarged prostate without lower urinary tract symptoms: Secondary | ICD-10-CM | POA: Insufficient documentation

## 2012-02-20 DIAGNOSIS — R1310 Dysphagia, unspecified: Secondary | ICD-10-CM | POA: Insufficient documentation

## 2012-02-20 DIAGNOSIS — F840 Autistic disorder: Secondary | ICD-10-CM | POA: Insufficient documentation

## 2012-02-20 LAB — DIFFERENTIAL
Eosinophils Relative: 2 % (ref 0–5)
Lymphocytes Relative: 10 % — ABNORMAL LOW (ref 12–46)
Lymphs Abs: 1.4 10*3/uL (ref 0.7–4.0)
Monocytes Absolute: 0.6 10*3/uL (ref 0.1–1.0)
Monocytes Relative: 4 % (ref 3–12)
Neutro Abs: 12 10*3/uL — ABNORMAL HIGH (ref 1.7–7.7)

## 2012-02-20 LAB — CBC
HCT: 28.1 % — ABNORMAL LOW (ref 39.0–52.0)
Hemoglobin: 9 g/dL — ABNORMAL LOW (ref 13.0–17.0)
MCH: 27.4 pg (ref 26.0–34.0)
MCHC: 32 g/dL (ref 30.0–36.0)
MCV: 85.4 fL (ref 78.0–100.0)

## 2012-02-20 LAB — BASIC METABOLIC PANEL
BUN: 26 mg/dL — ABNORMAL HIGH (ref 6–23)
CO2: 22 mEq/L (ref 19–32)
Glucose, Bld: 350 mg/dL — ABNORMAL HIGH (ref 70–99)
Potassium: 4.1 mEq/L (ref 3.5–5.1)
Sodium: 140 mEq/L (ref 135–145)

## 2012-02-20 LAB — GLUCOSE, CAPILLARY
Glucose-Capillary: 214 mg/dL — ABNORMAL HIGH (ref 70–99)
Glucose-Capillary: 329 mg/dL — ABNORMAL HIGH (ref 70–99)

## 2012-02-20 MED ORDER — KCL IN DEXTROSE-NACL 20-5-0.45 MEQ/L-%-% IV SOLN
INTRAVENOUS | Status: DC
  Administered 2012-02-20: 19:00:00 via INTRAVENOUS

## 2012-02-20 MED ORDER — IOHEXOL 300 MG/ML  SOLN
50.0000 mL | Freq: Once | INTRAMUSCULAR | Status: AC | PRN
  Administered 2012-02-20: 10 mL via INTRAVENOUS

## 2012-02-20 MED ORDER — FENTANYL CITRATE 0.05 MG/ML IJ SOLN
INTRAMUSCULAR | Status: AC | PRN
  Administered 2012-02-20: 25 ug via INTRAVENOUS

## 2012-02-20 MED ORDER — CEFAZOLIN SODIUM 1-5 GM-% IV SOLN
1.0000 g | Freq: Once | INTRAVENOUS | Status: AC
  Administered 2012-02-20: 1 g via INTRAVENOUS

## 2012-02-20 MED ORDER — MIDAZOLAM HCL 5 MG/5ML IJ SOLN
INTRAMUSCULAR | Status: AC | PRN
Start: 2012-02-20 — End: 2012-02-20
  Administered 2012-02-20: 2 mg via INTRAVENOUS

## 2012-02-20 MED ORDER — PANTOPRAZOLE SODIUM 40 MG PO PACK
40.0000 mg | PACK | Freq: Two times a day (BID) | ORAL | Status: DC
  Administered 2012-02-20 – 2012-02-23 (×5): 40 mg
  Filled 2012-02-20 (×12): qty 20

## 2012-02-20 MED ORDER — KCL IN DEXTROSE-NACL 40-5-0.9 MEQ/L-%-% IV SOLN: INTRAVENOUS | Status: DC

## 2012-02-20 MED ORDER — POTASSIUM CHLORIDE 2 MEQ/ML IV SOLN: INTRAVENOUS | Status: DC

## 2012-02-20 NOTE — Progress Notes (Signed)
NAME:  [name], [name]           ACCOUNT NO.:  [id number]  MEDICAL RECORD NO.:  [id number]  LOCATION:  IC07                          FACILITY:  [facility]  PHYSICIAN:  Samir Ishaq D. Legrand Rams, MD   DATE OF BIRTH:  27-Nov-1960  DATE OF PROCEDURE:  02/20/2012 DATE OF DISCHARGE:                                PROGRESS NOTE   The patient is opening his eyes.  Otherwise, he is unable to give any history.  OBJECTIVE:  GENERAL:  The patient looks better.  His breathing has improved.  Continued to have intermittent episode of fever.  He is planned for PEG tube placement today. VITALS:  Blood pressure 112/62, pulse 62, respiratory rate 18, temperature 97.7 degrees Fahrenheit. CHEST:  Decreased air entry, bilateral rhonchi. CARDIOVASCULAR SYSTEM:  First and second heart sounds heard.  No murmur. No gallop. ABDOMEN:  Soft and lax.  Bowel sound is positive.  No mass or organomegaly. EXTREMITIES:  No leg edema.  LABORATORY DATA:  Sodium 140, potassium 4.1, chloride 109, carbon dioxide 22, glucose 352, BUN 26, creatinine 1.7.  CBC, WBC 14.4, hemoglobin 9.0, hematocrit 28.1 and platelets 171.  ASSESSMENT: 1. Bilateral pneumonia. 2. Sepsis. 3. Acute on chronic renal failure, clinically improving. 4. Diabetes mellitus, poorly controlled. 5. Mental retardation. 6. Dysphagia.  PLAN:  We will continue the patient on IV antibiotics.  The patient is going to get PEG tube insertion today, and we will continue fluid and electrolyte management according to Nephrology and continue supportive care.     Sofie Schendel D. Legrand Rams, MD     TDF/MEDQ  D:  02/20/2012  T:  02/20/2012  Job:  [id number]

## 2012-02-20 NOTE — Progress Notes (Signed)
Patient ID: [name], male   DOB: Feb 22, 1961, 51 y.o.   MRN: [id number]

## 2012-02-20 NOTE — H&P (Signed)
[name] is an 51 y.o. male.   Chief Complaint: mentally retarded male Dysphagia; aspiration risk Scheduled for percutaneous gastric tube placement HPI: CKD; mentally handicapped; noncommunicative; DM  Past Medical History  Diagnosis Date  . Renal disorder   . Elective mutism   . Autism   . Hyperosmolar (nonketotic) coma   . Hyperglycemia   . Diabetes mellitus   . Hyponatremia   . Mental retardation   . Urinary retention   . BPH (benign prostatic hyperplasia)   . Gastroparesis   . Depression   . Pneumonia   . UTI (urinary tract infection)   . Mental retardation     No past surgical history on file.  No family history on file. Social History:  reports that he has never smoked. He does not have any smokeless tobacco history on file. He reports that he does not drink alcohol or use illicit drugs.  Allergies: No Known Allergies   (Not in a hospital admission)  Results for orders placed during the hospital encounter of 01/21/12 (from the past 48 hour(s))  GLUCOSE, CAPILLARY     Status: Abnormal   Collection Time   02/18/12 11:32 AM      Component Value Range Comment   Glucose-Capillary 397 (*) 70 - 99 mg/dL    Comment 1 Notify RN      Comment 2 Documented in Chart     GLUCOSE, CAPILLARY     Status: Abnormal   Collection Time   02/18/12  4:02 PM      Component Value Range Comment   Glucose-Capillary 249 (*) 70 - 99 mg/dL    Comment 1 Notify RN      Comment 2 Documented in Chart     GLUCOSE, CAPILLARY     Status: Abnormal   Collection Time   02/18/12  8:54 PM      Component Value Range Comment   Glucose-Capillary 146 (*) 70 - 99 mg/dL    Comment 1 Documented in Chart     GLUCOSE, CAPILLARY     Status: Abnormal   Collection Time   02/18/12 11:52 PM      Component Value Range Comment   Glucose-Capillary 191 (*) 70 - 99 mg/dL    Comment 1 Documented in Chart     GLUCOSE, CAPILLARY     Status: Abnormal   Collection Time   02/19/12  3:53 AM      Component  Value Range Comment   Glucose-Capillary 292 (*) 70 - 99 mg/dL    Comment 1 Documented in Chart     BASIC METABOLIC PANEL     Status: Abnormal   Collection Time   02/19/12  4:48 AM      Component Value Range Comment   Sodium 144  135 - 145 mEq/L    Potassium 3.4 (*) 3.5 - 5.1 mEq/L    Chloride 111  96 - 112 mEq/L    CO2 22  19 - 32 mEq/L    Glucose, Bld 284 (*) 70 - 99 mg/dL    BUN 27 (*) 6 - 23 mg/dL    Creatinine, Ser 1.69 (*) 0.50 - 1.35 mg/dL    Calcium 7.2 (*) 8.4 - 10.5 mg/dL    GFR calc non Af Amer 46 (*) >90 mL/min    GFR calc Af Amer 53 (*) >90 mL/min   DIFFERENTIAL     Status: Abnormal   Collection Time   02/19/12  4:48 AM      Component  Value Range Comment   Neutrophils Relative 84 (*) 43 - 77 %    Neutro Abs 11.4 (*) 1.7 - 7.7 K/uL    Lymphocytes Relative 9 (*) 12 - 46 %    Lymphs Abs 1.2  0.7 - 4.0 K/uL    Monocytes Relative 4  3 - 12 %    Monocytes Absolute 0.6  0.1 - 1.0 K/uL    Eosinophils Relative 3  0 - 5 %    Eosinophils Absolute 0.4  0.0 - 0.7 K/uL    Basophils Relative 0  0 - 1 %    Basophils Absolute 0.0  0.0 - 0.1 K/uL   CBC     Status: Abnormal   Collection Time   02/19/12  4:48 AM      Component Value Range Comment   WBC 13.6 (*) 4.0 - 10.5 K/uL    RBC 3.44 (*) 4.22 - 5.81 MIL/uL    Hemoglobin 9.5 (*) 13.0 - 17.0 g/dL    HCT 29.1 (*) 39.0 - 52.0 %    MCV 84.6  78.0 - 100.0 fL    MCH 27.6  26.0 - 34.0 pg    MCHC 32.6  30.0 - 36.0 g/dL    RDW 15.2  11.5 - 15.5 %    Platelets 190  150 - 400 K/uL   HEPATIC FUNCTION PANEL     Status: Abnormal   Collection Time   02/19/12  4:48 AM      Component Value Range Comment   Total Protein 5.5 (*) 6.0 - 8.3 g/dL    Albumin 1.8 (*) 3.5 - 5.2 g/dL    AST 29  0 - 37 U/L    ALT 26  0 - 53 U/L    Alkaline Phosphatase 94  39 - 117 U/L    Total Bilirubin 0.9  0.3 - 1.2 mg/dL    Bilirubin, Direct 0.5 (*) 0.0 - 0.3 mg/dL    Indirect Bilirubin 0.4  0.3 - 0.9 mg/dL   GLUCOSE, CAPILLARY     Status: Abnormal    Collection Time   02/19/12  7:47 AM      Component Value Range Comment   Glucose-Capillary 160 (*) 70 - 99 mg/dL    Comment 1 Notify RN     VANCOMYCIN, TROUGH     Status: Normal   Collection Time   02/19/12  8:47 AM      Component Value Range Comment   Vancomycin Tr 17.1  10.0 - 20.0 ug/mL   GLUCOSE, CAPILLARY     Status: Abnormal   Collection Time   02/19/12 11:39 AM      Component Value Range Comment   Glucose-Capillary 250 (*) 70 - 99 mg/dL   GLUCOSE, CAPILLARY     Status: Abnormal   Collection Time   02/19/12  4:22 PM      Component Value Range Comment   Glucose-Capillary 162 (*) 70 - 99 mg/dL    Comment 1 Notify RN     GLUCOSE, CAPILLARY     Status: Abnormal   Collection Time   02/19/12  7:17 PM      Component Value Range Comment   Glucose-Capillary 36 (*) 70 - 99 mg/dL    Comment 1 Notify RN     GLUCOSE, CAPILLARY     Status: Abnormal   Collection Time   02/19/12  7:44 PM      Component Value Range Comment   Glucose-Capillary 121 (*) 70 - 99 mg/dL  Comment 1 Notify RN     GLUCOSE, CAPILLARY     Status: Abnormal   Collection Time   02/20/12 12:06 AM      Component Value Range Comment   Glucose-Capillary 214 (*) 70 - 99 mg/dL    Comment 1 Notify RN     GLUCOSE, CAPILLARY     Status: Abnormal   Collection Time   02/20/12  4:08 AM      Component Value Range Comment   Glucose-Capillary 329 (*) 70 - 99 mg/dL    Comment 1 Notify RN     BASIC METABOLIC PANEL     Status: Abnormal   Collection Time   02/20/12  4:29 AM      Component Value Range Comment   Sodium 140  135 - 145 mEq/L    Potassium 4.1  3.5 - 5.1 mEq/L DELTA CHECK NOTED   Chloride 109  96 - 112 mEq/L    CO2 22  19 - 32 mEq/L    Glucose, Bld 350 (*) 70 - 99 mg/dL    BUN 26 (*) 6 - 23 mg/dL    Creatinine, Ser 1.70 (*) 0.50 - 1.35 mg/dL    Calcium 7.0 (*) 8.4 - 10.5 mg/dL    GFR calc non Af Amer 45 (*) >90 mL/min    GFR calc Af Amer 52 (*) >90 mL/min   DIFFERENTIAL     Status: Abnormal   Collection Time    02/20/12  4:29 AM      Component Value Range Comment   Neutrophils Relative 84 (*) 43 - 77 %    Neutro Abs 12.0 (*) 1.7 - 7.7 K/uL    Lymphocytes Relative 10 (*) 12 - 46 %    Lymphs Abs 1.4  0.7 - 4.0 K/uL    Monocytes Relative 4  3 - 12 %    Monocytes Absolute 0.6  0.1 - 1.0 K/uL    Eosinophils Relative 2  0 - 5 %    Eosinophils Absolute 0.3  0.0 - 0.7 K/uL    Basophils Relative 0  0 - 1 %    Basophils Absolute 0.0  0.0 - 0.1 K/uL   CBC     Status: Abnormal   Collection Time   02/20/12  4:29 AM      Component Value Range Comment   WBC 14.4 (*) 4.0 - 10.5 K/uL    RBC 3.29 (*) 4.22 - 5.81 MIL/uL    Hemoglobin 9.0 (*) 13.0 - 17.0 g/dL    HCT 28.1 (*) 39.0 - 52.0 %    MCV 85.4  78.0 - 100.0 fL    MCH 27.4  26.0 - 34.0 pg    MCHC 32.0  30.0 - 36.0 g/dL    RDW 15.6 (*) 11.5 - 15.5 %    Platelets 171  150 - 400 K/uL   GLUCOSE, CAPILLARY     Status: Abnormal   Collection Time   02/20/12  7:33 AM      Component Value Range Comment   Glucose-Capillary 219 (*) 70 - 99 mg/dL    Comment 1 Notify RN      Comment 2 Documented in Chart      No results found.  Review of Systems  Constitutional: Negative for fever and chills.  Gastrointestinal: Negative for nausea and vomiting.    Blood pressure 159/86, pulse 85, temperature 97.8 F (36.6 C), resp. rate 21, SpO2 100.00%. Physical Exam  Constitutional: He appears well-developed.  Cardiovascular: Normal rate,  regular rhythm and normal heart sounds.   Respiratory: Effort normal and breath sounds normal. He has no wheezes.  GI: Soft. Bowel sounds are normal.  Musculoskeletal:       Pt mentally handicapped; does not ambulate; left foot/ankle in large brace; Moves all 4s- not to command  Neurological:       Pt does not communicate verbally; does follow some simple commands- open mouth; breathe in.....  Skin: Skin is warm and dry.  Psychiatric:       Consent was signed by guardian; pt is ward of state     Assessment/Plan 51 yo mentally  retarded male Non communicative; ward of state Dysphagia; aspiration risk Scheduled for percutaneous gastric tube placement Consent has been signed by guardian Inpt at [facility].  Alyiah Ulloa A 02/20/2012, 10:44 AM

## 2012-02-20 NOTE — H&P (Signed)
Agree 

## 2012-02-20 NOTE — Progress Notes (Signed)
Tylenol given for temp of 101F axillary.

## 2012-02-20 NOTE — Clinical Social Work Note (Signed)
CSW spoke with [facility] yesterday and asked about code status.  [name] requests that pt remain a full code at this time.    [name], [place]

## 2012-02-20 NOTE — Progress Notes (Signed)
[name]  MRN: [id number]  DOB/AGE: 09/04/1960 51 y.o.  Primary Care Physician:FANTA,TESFAYE, MD  Admit date: 01/21/2012  Chief Complaint:  Chief Complaint  Patient presents with  . Hypoglycemia    S-Pt presented on  01/21/2012 with  Chief Complaint  Patient presents with  . Hypoglycemia  . Marland Kitchen  Pt looks better than before.  Pt mumbling but not able to make words.trying to communicate- which is better than before   Meds    . albuterol  2.5 mg Nebulization BID   And  . ipratropium  0.5 mg Nebulization BID  . antiseptic oral rinse  15 mL Mouth Rinse QID  . chlorhexidine  15 mL Mouth Rinse BID  . dextrose      . enoxaparin (LOVENOX) injection  30 mg Subcutaneous Q24H  . feeding supplement  30 mL Oral TID WC  . insulin aspart  0-9 Units Subcutaneous Q4H  . insulin NPH  5 Units Subcutaneous Q8H  . pantoprazole sodium  40 mg Per Tube BID AC  . piperacillin-tazobactam (ZOSYN)  IV  3.375 g Intravenous Q8H  . polyethylene glycol  17 g Oral q morning - 10a  . sertraline  150 mg Oral Daily  . sodium chloride  10-40 mL Intracatheter Q12H  . Tamsulosin HCl  0.4 mg Oral Daily  . traZODone  50 mg Oral QHS  . vancomycin  1,000 mg Intravenous Q24H  . ziprasidone  40 mg Oral BID WC  . DISCONTD: famotidine  20 mg Oral Daily  . DISCONTD: pantoprazole  40 mg Oral Q1200  . DISCONTD: pantoprazole  40 mg Oral BID AC         [id number] from the symptoms mentioned above,there are no other symptoms referable to all systems reviewed.  Physical Exam: Vital signs in last 24 hours: Temp:  [97.7 F (36.5 C)-101.3 F (38.5 C)] 97.8 F (36.6 C) (06/19 1010) Pulse Rate:  [62-110] 83  (06/19 1225) Resp:  [18-48] 22  (06/19 1225) BP: (96-167)/(46-120) 164/81 mmHg (06/19 1225) SpO2:  [84 %-100 %] 100 % (06/19 1225) Weight:  [149 lb 4 oz (67.7 kg)] 149 lb 4 oz (67.7 kg) (06/19 0500) Weight change: -2 lb 10.3 oz (-1.2 kg) Last BM Date: 02/20/12  Intake/Output from previous  day: 06/18 0701 - 06/19 0700 In: 3870 [P.O.:320; I.V.:2400; NG/GT:800; IV Piggyback:350] Out: 5425 [Urine:5425] Total I/O In: 280 [I.V.:200; NG/GT:80] Out: 1150 [Urine:1150]   Physical Exam: General- pt is awake,follows commands. Resp- No acute REsp distress, Chest clear to auscultation  CVS- S1S2 regular in rate and rhythm  GIT- BS+, soft, NT, ND,PEG tube in situ  EXT- NO LE Edema, Cyanosis      Lab Results: CBC  Basename 02/20/12 0429 02/19/12 0448  WBC 14.4* 13.6*  HGB 9.0* 9.5*  HCT 28.1* 29.1*  PLT 171 190    BMET  Basename 02/20/12 0429 02/19/12 0448  NA 140 144  K 4.1 3.4*  CL 109 111  CO2 22 22  GLUCOSE 350* 284*  BUN 26* 27*  CREATININE 1.70* 1.69*  CALCIUM 7.0* 7.2*    MICRO Recent Results (from the past 240 hour(s))  CULTURE, BLOOD (ROUTINE X 2)     Status: Normal (Preliminary result)   Collection Time   02/16/12  4:24 AM      Component Value Range Status Comment   Specimen Description BLOOD PICC LINE DRAWN BY RN   Final    Special Requests BOTTLES DRAWN AEROBIC AND ANAEROBIC [place]   Final  Culture NO GROWTH 4 DAYS   Final    Report Status PENDING   Incomplete   CULTURE, BLOOD (ROUTINE X 2)     Status: Normal (Preliminary result)   Collection Time   02/16/12  4:24 AM      Component Value Range Status Comment   Specimen Description BLOOD RIGHT HAND   Final    Special Requests BOTTLES DRAWN AEROBIC AND ANAEROBIC 6CC   Final    Culture NO GROWTH 4 DAYS   Final    Report Status PENDING   Incomplete   URINE CULTURE     Status: Normal   Collection Time   02/16/12  4:33 AM      Component Value Range Status Comment   Specimen Description URINE, CATHETERIZED   Final    Special Requests NONE   Final    Culture  Setup Time [id number]   Final    Colony Count 85,000 COLONIES/ML   Final    Culture YEAST   Final    Report Status 02/17/2012 FINAL   Final       Lab Results  Component Value Date   CALCIUM 7.0* 02/20/2012   CAION 1.25 12/04/2011   PHOS  4.1 02/16/2012               Impression: 1)Renal  AKI secondary to Prerenal/ ATN  AKI secondary to Hypotension+ sepsis + ACE  AKI on CKD  Most likely another episode of AKI from sepsis/Hypotension.  Earlier AKI- NON OLiguric ATN  Creat now at 1.7-at his baseline  CKD stage 3 .  CKD since 2011  CKD secondary to HTN/ DM  Progression of CKD -Marked with AKI   2)CVS- Hemodynamically stable   3)Anemia HGb NOT at goal (9--11)  Transfusion PRN as per primary team  GI work up-as per Primary team   4)ID SEpsis  ON ABX   5)DM- Uncontrolled  Being managed by Primary team /Endo.   6)Hypernatremia-  Sodium now at goal  Pt earlier had HypoNatremia secondary to Hypovolemia + SSRI   7)Acid base  Co2 at goal   8) CNS- Metabolic Encephalopathy-now Better   9)Hypokalmia- Now better.  10) Hypocalcemia( 7. + 2.2 times 0.8=8.76-) Corrected calcium is within range.      Plan:  Will decrease kcl in IVf as potassium  now better      Rj Pedrosa S 02/20/2012, 2:05 PM

## 2012-02-20 NOTE — Progress Notes (Signed)
[name], [place]           ACCOUNT NO.:  [id number]  MEDICAL RECORD NO.:  [id number]  LOCATION:                                 FACILITY:  PHYSICIAN:  Loni Beckwith, MD DATE OF BIRTH:  Mar 28, 1961  DATE OF PROCEDURE:  02/20/2012 DATE OF DISCHARGE:                                PROGRESS NOTE   REASON FOR FOLLOWUP:  Uncontrolled type 1 diabetes.  SUBJECTIVE:  The patient remains in ICU.  Not tolerating any significant amount of oral intake.  No hypoglycemia.  Currently on minimal insulin therapy, septic on antibiotics.  OBJECTIVE:  GENERAL:  Not in any distress.  NG tube in place. VITAL SIGNS:  A blood pressure of 150/66, pulse rate 69, temperature 100 degrees Fahrenheit. HEENT:  Well hydrated. NECK:  Negative for JVD. CHEST:  Significant for diffuse rhonchi. CARDIOVASCULAR:  Distant heart sounds.  No murmur.  No gallop. ABDOMEN:  Positive bowel sounds. EXTREMITIES:  No edema.  His most recent labs show sodium 140, potassium 4.1, chloride 109, bicarb 22, BUN 26, creatinine improving at 1.7.  His glycemic range is from 120-300.  ASSESSMENT: 1. Type 1 diabetes, chronically uncontrolled.  Recent A1c of 8%. 2. Respiratory failure status post mechanical ventilation. 3. Sepsis. 4. Bilateral pneumonia. 5. Acute-on-chronic kidney injury. 6. Poor oral intake. 7. Mental retardation.  PLAN:  While mechanism for long-term feeding is being established, we will continue with the current minimal dose of insulin to avoid hypoglycemia.  Continue NPH 5 units every 8 hours and NovoLog sensitive dose for correction every 6 hours.  He is at high risk for hypoglycemia as well as diabetic ketoacidosis.  Close followup is still needed.  I will return to re-evaluate and titrate insulin dose based on fingerstick blood glucose readings.          ______________________________ Loni Beckwith, MD     GN/MEDQ  D:  02/20/2012  T:  02/20/2012  Job:  [id number]

## 2012-02-20 NOTE — Procedures (Signed)
Procedure:  Gastrostomy tube placement Findings:  Bumper retention gastrostomy tube placed in body of stomach.  OK to use in 24 hours.

## 2012-02-21 ENCOUNTER — Inpatient Hospital Stay (HOSPITAL_COMMUNITY): Payer: Medicaid Other

## 2012-02-21 DIAGNOSIS — Z431 Encounter for attention to gastrostomy: Secondary | ICD-10-CM

## 2012-02-21 LAB — CBC
HCT: 29.3 % — ABNORMAL LOW (ref 39.0–52.0)
Hemoglobin: 9.6 g/dL — ABNORMAL LOW (ref 13.0–17.0)
MCH: 27.9 pg (ref 26.0–34.0)
MCHC: 32.8 g/dL (ref 30.0–36.0)
MCV: 85.2 fL (ref 78.0–100.0)

## 2012-02-21 LAB — DIFFERENTIAL
Eosinophils Relative: 4 % (ref 0–5)
Lymphocytes Relative: 11 % — ABNORMAL LOW (ref 12–46)
Monocytes Absolute: 0.5 10*3/uL (ref 0.1–1.0)
Monocytes Relative: 4 % (ref 3–12)
Neutro Abs: 9.5 10*3/uL — ABNORMAL HIGH (ref 1.7–7.7)

## 2012-02-21 LAB — BASIC METABOLIC PANEL
BUN: 23 mg/dL (ref 6–23)
CO2: 23 mEq/L (ref 19–32)
Glucose, Bld: 124 mg/dL — ABNORMAL HIGH (ref 70–99)
Potassium: 3.5 mEq/L (ref 3.5–5.1)
Sodium: 142 mEq/L (ref 135–145)

## 2012-02-21 LAB — CULTURE, BLOOD (ROUTINE X 2): Culture: NO GROWTH

## 2012-02-21 LAB — GLUCOSE, CAPILLARY
Glucose-Capillary: 90 mg/dL (ref 70–99)
Glucose-Capillary: 95 mg/dL (ref 70–99)
Glucose-Capillary: 162 mg/dL — ABNORMAL HIGH (ref 70–99)

## 2012-02-21 MED ORDER — SODIUM CHLORIDE 0.9 % IJ SOLN
INTRAMUSCULAR | Status: AC
  Administered 2012-02-21: 30 mL
  Filled 2012-02-21: qty 3

## 2012-02-21 MED ORDER — POTASSIUM CHLORIDE CRYS ER 20 MEQ PO TBCR
40.0000 meq | EXTENDED_RELEASE_TABLET | Freq: Two times a day (BID) | ORAL | Status: DC
  Administered 2012-02-21: 40 meq via ORAL
  Filled 2012-02-21: qty 2

## 2012-02-21 MED ORDER — JEVITY 1.2 CAL PO LIQD
1000.0000 mL | ORAL | Status: DC
  Administered 2012-02-21: 1000 mL
  Administered 2012-02-22: 18:00:00
  Filled 2012-02-21 (×2): qty 1000

## 2012-02-21 NOTE — Progress Notes (Signed)
Subjective:  Patient has PEG now, placed by IR. Abdominal binder in place. Patient mumbles.   Objective: Vital signs in last 24 hours: Temp:  [96.9 F (36.1 C)-98.7 F (37.1 C)] 96.9 F (36.1 C) (06/20 0531) Pulse Rate:  [74-99] 74  (06/20 0531) Resp:  [18-31] 20  (06/20 0531) BP: (96-177)/(50-95) 161/85 mmHg (06/20 0531) SpO2:  [98 %-100 %] 100 % (06/20 0531) Last BM Date: 02/20/12 General:   Alert,   NAD Head:  Normocephalic and atraumatic. Eyes:  Sclera clear, no icterus.   Abdomen:  Soft, nontender and nondistended.  Abd binder in place. PEG dressing dry.   Extremities:  Without clubbing, deformity or edema. Neurologic:  Alert  Skin:  Intact without significant lesions or rashes. Psych:  Alert and cooperative.  Intake/Output from previous day: 06/19 0701 - 06/20 0700 In: 280 [I.V.:200; NG/GT:80] Out: [id number] [Urine:3475] Intake/Output this shift:    Lab Results: CBC  Basename 02/21/12 0518 02/20/12 0429 02/19/12 0448  WBC 11.8* 14.4* 13.6*  HGB 9.6* 9.0* 9.5*  HCT 29.3* 28.1* 29.1*  MCV 85.2 85.4 84.6  PLT 178 171 190   BMET  Basename 02/21/12 0518 02/20/12 0429 02/19/12 0448  NA 142 140 144  K 3.5 4.1 3.4*  CL 109 109 111  CO2 23 22 22   GLUCOSE 124* 350* 284*  BUN 23 26* 27*  CREATININE 1.61* 1.70* 1.69*  CALCIUM 7.5* 7.0* 7.2*   LFTs  Basename 02/19/12 0448  BILITOT 0.9  BILIDIR 0.5*  IBILI 0.4  ALKPHOS 94  AST 29  ALT 26  PROT 5.5*  ALBUMIN 1.8*   Imaging Studies: Ir Gastrostomy Tube Mod Sed  02/20/2012  *RADIOLOGY REPORT*  Clinical Data:  Mental retardation and aspiration pneumonia.  The patient requires a gastrostomy tube.  PERCUTANEOUS GASTROSTOMY TUBE PLACEMENT  Sedation:  2.0 mg IV Versed;  50 mcg IV Fentanyl.  Total Moderate Sedation Time: 10 minutes.  Contrast:  10 ml Omnipaque-300  Additional Medications:  1 gram IV Ancef.  As antibiotic prophylaxis, Ancef 1 gm was ordered pre-procedure and administered intravenously within one hour of  incision.  Fluoroscopy Time: 3.1 minutes.  Procedure:  The procedure, risks, benefits, and alternatives were explained to the patient.  Questions regarding the procedure were encouraged and answered.  The patient understands and consents to the procedure.  The evening prior to the procedure, the patient was given thin liquid barium to ingest in order to opacify the colon.  The throat was sprayed with topical anesthetic.  A 5-French catheter was then advanced through the patient's mouth under fluoroscopy into the esophagus and to the level of the stomach.  This catheter was used to insufflate the stomach with air under fluoroscopy.  The abdominal wall was prepped with Betadine in a sterile fashion, and a sterile drape was applied covering the operative field.  A sterile gown and sterile gloves were used for the procedure. Local anesthesia was provided with 1% Lidocaine.  A skin incision was made in the upper abdominal wall.  Under fluoroscopy, an 18 gauge trocar needle was advanced into the stomach.  Contrast injection was performed to confirm intraluminal position of the needle tip.  A single T tack was then deployed in the lumen of the stomach.  This was brought up to tension at the skin surface.  Over a guidewire, a 9-French sheath was advanced into the lumen of the stomach.  The wire was left in place as a safety wire.  A loop snare device from a  percutaneous gastrostomy kit was then advanced into the stomach.  A floppy guide wire was advanced through the orogastric catheter under fluoroscopy in the stomach.  The loop snare advanced through the percutaneous gastric access was used to snare the guide wire. This allowed withdrawal of the loop snare out of the patient's mouth by retraction of the orogastric catheter and wire.  A 20-French bumper retention gastrostomy tube was looped around the snare device.  It was then pulled back through the patient's mouth. The retention bumper was brought up to the anterior  gastric wall. The T tack suture was cut at the skin.  The exiting gastrostomy tube was cut to appropriate length and a feeding adapter applied. The catheter was injected with contrast material to confirm position and a fluoroscopic spot image saved.  The tube was then flushed with saline.  A dressing was applied over the gastrostomy exit sit  Complications:  None  Findings:  Initial fluoroscopy demonstrates adequate opacification of the colon by ingested barium in order to prevent colonic injury during the procedure.  The stomach distended well with air allowing safe placement of the gastrostomy tube.  After placement, the tip of the gastrostomy tube lies in the body of the stomach.  IMPRESSION: Percutaneous gastrostomy with placement of a 20-French bumper retention tube in the body of the stomach.  This tube can be used for percutaneous feeds beginning in 24 hours after placement.  Original Report Authenticated By: Azzie Roup, M.D.      Assessment: s/p PEG placement by IR. Continue aspiration precautions ie have patient at 90 degrees with feedings.  Normocytic anemia in setting of CKD. Iron low. Folate borderline. Heme negative X 1. Ferritin elevated due to APR. Suspect multifactorial anemia, anemia of chronic disease +/- IDA/folate def.     Plan: 1. Abdominal binder for six weeks. 2. Transfuse as needed. Without overt GI bleeding, would not recommend endoscopy during this admission. Could consider outpatient work-up if attending/family desire.  3. OK to start using PEG for water and meds. Begin feedings later today as per dietician recommendations.    LOS: 31 days   Neil Crouch  02/21/2012, 7:43 AM  As above. Will continue to follow peripherally

## 2012-02-21 NOTE — Progress Notes (Signed)
[name], [name]           ACCOUNT NO.:  [id number]  MEDICAL RECORD NO.:  [id number]  LOCATION:  A306                          FACILITY:  [facility]  PHYSICIAN:  Loni Beckwith, MD DATE OF BIRTH:  1961/01/04  DATE OF PROCEDURE:  02/21/2012 DATE OF DISCHARGE:                                PROGRESS NOTE   REASON FOR FOLLOWUP:  Uncontrolled type 1 diabetes.  SUBJECTIVE:  The patient is improving.  He is status post PEG tube placement.  He has not started to use it yet.  He did not have any hypoglycemia.  He is tolerating NPH 5 units every 8 hours and NovoLog sliding scale.  OBJECTIVE:  GENERAL:  The patient is out of the ICU, not in acute distress.  He is responding to all stimuli, still nonverbal. VITAL SIGNS:  His blood pressure is 170/89, pulse rate is 86, temperature 97.6. HEENT:  Well hydrated. CHEST:  Significant for bibasilar rhonchi. CARDIOVASCULAR:  Distant heart sounds.  No murmur.  No gallop. ABDOMEN:  Positive bowel sounds.  In his abdomen, there is a PEG tube in place. EXTREMITIES:  No edema.  LABORATORY DATA:  His most recent labs show sodium 142, potassium 3.5, chloride 109, bicarb 23, BUN 23, creatinine improving at 1.61.  Glycemic profile ranged from 120-170.  ASSESSMENT: 1. Type 1 diabetes, clinically uncontrolled.  Recent A1c 8%. 2. Respiratory failure status post mechanical ventilation. 3. PEG tube placement due to dysphagia. 4. Bilateral pneumonia, status post therapy. 5. Sepsis, on antibiotics. 6. Mental retardation.  PLAN:  Continue NPH 5 units every 8 hours plus NovoLog sensitive dose sliding scale every 6 hours.  When the patient starts feeding through the PEG tube and blood glucose monitoring shows driving readings, I will readjust his insulin, likely he will need higher doses.  Close followup is still needed.  I will return to evaluate the patient in 24 hours.          ______________________________ Loni Beckwith,  MD     GN/MEDQ  D:  02/21/2012  T:  02/21/2012  Job:  [id number]

## 2012-02-21 NOTE — Clinical Social Work Note (Signed)
[facility] notified of possible d/c tomorrow.  Angela at facility will check with administration about potential Saturday d/c if pt is not stable enough Friday.  Medicaid prior approval forwarded to SNF and DSS.   [name], [place]

## 2012-02-21 NOTE — Progress Notes (Signed)
Patient ID: [name], male   DOB: Mar 29, 1961, 51 y.o.   MRN: [id number]

## 2012-02-21 NOTE — Progress Notes (Signed)
Nutrition Follow-up-TF Initiation/Management  Diet Order: Dys 1 diet with Honey-thick liquids. S/p PEG tube placed yesterday mid-day by interventional radiology. Wait 24-h to use PEG per MD.  Meds: Scheduled Meds:   . albuterol  2.5 mg Nebulization BID   And  . ipratropium  0.5 mg Nebulization BID  . antiseptic oral rinse  15 mL Mouth Rinse QID  . chlorhexidine  15 mL Mouth Rinse BID  . enoxaparin (LOVENOX) injection  30 mg Subcutaneous Q24H  . feeding supplement  30 mL Oral TID WC  . insulin aspart  0-9 Units Subcutaneous Q4H  . insulin NPH  5 Units Subcutaneous Q8H  . pantoprazole sodium  40 mg Per Tube BID AC  . piperacillin-tazobactam (ZOSYN)  IV  3.375 g Intravenous Q8H  . polyethylene glycol  17 g Oral q morning - 10a  . potassium chloride  40 mEq Oral BID  . sertraline  150 mg Oral Daily  . sodium chloride  10-40 mL Intracatheter Q12H  . Tamsulosin HCl  0.4 mg Oral Daily  . traZODone  50 mg Oral QHS  . vancomycin  1,000 mg Intravenous Q24H  . ziprasidone  40 mg Oral BID WC   Continuous Infusions:   . DISCONTD: dextrose 5 % and 0.45 % NaCl with KCl 20 mEq/L 100 mL/hr at 02/20/12 1901  . DISCONTD: dextrose 5 %-0.45% nacl with kcl pediatric IV fluid    . DISCONTD: dextrose 5 % and 0.9 % NaCl with KCl 40 mEq/L 100 mL/hr at 02/20/12 0900  . DISCONTD: dextrose 5 % and 0.9 % NaCl with KCl 40 mEq/L     PRN Meds:.acetaminophen, albuterol, ALPRAZolam, alum & mag hydroxide-simeth, dextrose, dextrose, hyoscyamine, LORazepam, morphine injection, ondansetron, sodium chloride  Labs:  CMP     Component Value Date/Time   NA 142 02/21/2012 0518   K 3.5 02/21/2012 0518   CL 109 02/21/2012 0518   CO2 23 02/21/2012 0518   GLUCOSE 124* 02/21/2012 0518   BUN 23 02/21/2012 0518   CREATININE 1.61* 02/21/2012 0518   CALCIUM 7.5* 02/21/2012 0518   PROT 5.5* 02/19/2012 0448   ALBUMIN 1.8* 02/19/2012 0448   AST 29 02/19/2012 0448   ALT 26 02/19/2012 0448   ALKPHOS 94 02/19/2012 0448   BILITOT 0.9  02/19/2012 0448   GFRNONAA 48* 02/21/2012 0518   GFRAA 56* 02/21/2012 0518     Intake/Output Summary (Last 24 hours) at 02/21/12 1020 Last data filed at 02/21/12 [id number]  Gross per 24 hour  Intake      0 ml  Output   2325 ml  Net  -2325 ml    Weight Status:  149# stable (wt 151# on 02/19/12)  Estimated needs:  1904-2040 kcal/day  95-105 gr/day  1 ml/kcal   Nutrition Dx: Inadequate oral intake-ongoing  Goal: Pt will consume >80% of est energy and protein needs. Goal not met  Interventions:   When pt is medically cleared to start with EN; rec continuous TF of Jevity 1.2 via PEG. Start @ 20 ml/hr advance 10 ml q 4h to goal rate of 60 ml/hr. At goal formula will supply 1728 kcal, 80 gr protein, 1162 ml water daily. Also, continue ProStat 30 ml BID (200 kcal, 30 gr protein) via PEG if pt unable to take po.  Total Nutrition support provides: 1928 kcal, 110 gr protein daily which meets 100% est energy and protein requirements.  Will follow for nutritional needs.    Frederik Schmidt  Pager # [phone]

## 2012-02-21 NOTE — Progress Notes (Signed)
Patient has a progressive decubitus ulcer on his dorsal scrotum and continuous loose stools that are causing further damage. Consider rectal tube.

## 2012-02-21 NOTE — Progress Notes (Signed)
Subjective: Interval History: none.  Objective: Vital signs in last 24 hours: Temp:  [96.9 F (36.1 C)-98.7 F (37.1 C)] 96.9 F (36.1 C) (06/20 0531) Pulse Rate:  [74-99] 74  (06/20 0531) Resp:  [18-23] 20  (06/20 0531) BP: (117-177)/(50-95) 161/85 mmHg (06/20 0531) SpO2:  [98 %-100 %] 100 % (06/20 0531) Weight change:   Intake/Output from previous day: 06/19 0701 - 06/20 0700 In: 280 [I.V.:200; NG/GT:80] Out: 3475 [Urine:3475] Intake/Output this shift:    General appearance: alert and no distress Resp: clear to auscultation bilaterally Cardio: regular rate and rhythm, S1, S2 normal, no murmur, click, rub or gallop GI: soft, non-tender; bowel sounds normal; no masses,  no organomegaly Extremities: extremities normal, atraumatic, no cyanosis or edema  Lab Results:  Basename 02/21/12 0518 02/20/12 0429  WBC 11.8* 14.4*  HGB 9.6* 9.0*  HCT 29.3* 28.1*  PLT 178 171   BMET:  Basename 02/21/12 0518 02/20/12 0429  NA 142 140  K 3.5 4.1  CL 109 109  CO2 23 22  GLUCOSE 124* 350*  BUN 23 26*  CREATININE 1.61* 1.70*  CALCIUM 7.5* 7.0*   No results found for this basename: PTH:2 in the last 72 hours Iron Studies: No results found for this basename: IRON,TIBC,TRANSFERRIN,FERRITIN in the last 72 hours  Studies/Results: Ir Gastrostomy Tube Mod Sed  02/20/2012  *RADIOLOGY REPORT*  Clinical Data:  Mental retardation and aspiration pneumonia.  The patient requires a gastrostomy tube.  PERCUTANEOUS GASTROSTOMY TUBE PLACEMENT  Sedation:  2.0 mg IV Versed;  50 mcg IV Fentanyl.  Total Moderate Sedation Time: 10 minutes.  Contrast:  10 ml Omnipaque-300  Additional Medications:  1 gram IV Ancef.  As antibiotic prophylaxis, Ancef 1 gm was ordered pre-procedure and administered intravenously within one hour of incision.  Fluoroscopy Time: 3.1 minutes.  Procedure:  The procedure, risks, benefits, and alternatives were explained to the patient.  Questions regarding the procedure were  encouraged and answered.  The patient understands and consents to the procedure.  The evening prior to the procedure, the patient was given thin liquid barium to ingest in order to opacify the colon.  The throat was sprayed with topical anesthetic.  A 5-French catheter was then advanced through the patient'Kennedy mouth under fluoroscopy into the esophagus and to the level of the stomach.  This catheter was used to insufflate the stomach with air under fluoroscopy.  The abdominal wall was prepped with Betadine in a sterile fashion, and a sterile drape was applied covering the operative field.  A sterile gown and sterile gloves were used for the procedure. Local anesthesia was provided with 1% Lidocaine.  A skin incision was made in the upper abdominal wall.  Under fluoroscopy, an 18 gauge trocar needle was advanced into the stomach.  Contrast injection was performed to confirm intraluminal position of the needle tip.  A single T tack was then deployed in the lumen of the stomach.  This was brought up to tension at the skin surface.  Over a guidewire, a 9-French sheath was advanced into the lumen of the stomach.  The wire was left in place as a safety wire.  A loop snare device from a percutaneous gastrostomy kit was then advanced into the stomach.  A floppy guide wire was advanced through the orogastric catheter under fluoroscopy in the stomach.  The loop snare advanced through the percutaneous gastric access was used to snare the guide wire. This allowed withdrawal of the loop snare out of the patient'Kennedy mouth by  retraction of the orogastric catheter and wire.  A 20-French bumper retention gastrostomy tube was looped around the snare device.  It was then pulled back through the patient'Kennedy mouth. The retention bumper was brought up to the anterior gastric wall. The T tack suture was cut at the skin.  The exiting gastrostomy tube was cut to appropriate length and a feeding adapter applied. The catheter was injected with  contrast material to confirm position and a fluoroscopic spot image saved.  The tube was then flushed with saline.  A dressing was applied over the gastrostomy exit sit  Complications:  None  Findings:  Initial fluoroscopy demonstrates adequate opacification of the colon by ingested barium in order to prevent colonic injury during the procedure.  The stomach distended well with air allowing safe placement of the gastrostomy tube.  After placement, the tip of the gastrostomy tube lies in the body of the stomach.  IMPRESSION: Percutaneous gastrostomy with placement of a 20-French bumper retention tube in the body of the stomach.  This tube can be used for percutaneous feeds beginning in 24 hours after placement.  Original Report Authenticated By: Azzie Roup, M.D.   Dg Chest Port 1 View  02/21/2012  *RADIOLOGY REPORT*  Clinical Data: Pneumonia.  PORTABLE CHEST - 1 VIEW  Comparison: 02/16/2012.  Findings: The PICC line is stable.  The lung shows slight improved aeration with decreasing airspace consolidation in both lungs.  No pleural effusions.  No pneumothorax.  IMPRESSION: Persistent but improving bilateral infiltrates.  Original Report Authenticated By: P. Kalman Jewels, M.D.    I have reviewed the patient'Kennedy current medications.  Assessment/Plan: Problem #1 acute kidney injury his  BUN and creatinine at this moment seems to be improving. Patient is none oliguric. Problem #2 history of hypokalemia is on potassium supplement and his potassium has corrected Problem #3 history of diabetes Problem #4 history of BPH patient is a chronic Foley catheter.  problem#5 history of anemia H&H stable Problem #6 history of pneumonia Problem #7 history of mental retardation. Plan: We'll start patient on by mouth potassium We'll DC his IV fluid to his potassium. We'll check his basic metabolic panel in the morning.     LOS: 31 days   [name] 02/21/2012,9:40 AM

## 2012-02-21 NOTE — Progress Notes (Signed)
NAME:  [name], [name]           ACCOUNT NO.:  [id number]  MEDICAL RECORD NO.:  [id number]  LOCATION:  A306                          FACILITY:  [facility]  PHYSICIAN:  Bellamia Ferch D. Legrand Rams, MD   DATE OF BIRTH:  03-Nov-1960  DATE OF PROCEDURE:  02/21/2012 DATE OF DISCHARGE:                                PROGRESS NOTE   SUBJECTIVE:  The patient is unable to communicate verbally, however, there is no complaint.  His fever is improving.  OBJECTIVE:  GENERAL:  The patient is more alert, awake, and his breathing is much improved. VITAL SIGNS:  Blood pressure 196/63, pulse 67, respiratory rate 18, temperature 98.7 degrees Fahrenheit. CHEST:  Decreased air entry, few rhonchi. CARDIOVASCULAR SYSTEM:  First and second heart sounds heard.  No murmur. No gallop. ABDOMEN:  Soft and lax.  Bowel sound is positive.  No mass or organomegaly.  EXTREMITIES:  No leg edema.  LABORATORY DATA:  Sodium 142, potassium 3.5, chloride 109, carbon dioxide 23, glucose 124, BUN 23, creatinine 1.61, and calcium 7.5.  CBC: WBC 11.8, hemoglobin 9.6, hematocrit 29.3, and platelets 178.  ASSESSMENT: 1. Bilateral pneumonia, clinically improving. 2. History of sepsis. 3. Acute on chronic renal failure, clinically improving. 4. Dysphagia, on tube feeding. 5. Diabetes mellitus type 2. 6. Failure to thrive. 7. Mental retardation.  PLAN:  We will continue the patient on IV antibiotics.  We will repeat his chest x-ray.  We will continue tube feeding.  We will continue current fluid and electrolyte management according to Nephrology recommendation.     Bretta Fees D. Legrand Rams, MD     TDF/MEDQ  D:  02/21/2012  T:  02/21/2012  Job:  [id number]

## 2012-02-22 LAB — BASIC METABOLIC PANEL
Chloride: 105 mEq/L (ref 96–112)
Creatinine, Ser: 1.64 mg/dL — ABNORMAL HIGH (ref 0.50–1.35)
GFR calc Af Amer: 55 mL/min — ABNORMAL LOW (ref >90)
Potassium: 3.7 mEq/L (ref 3.5–5.1)
Sodium: 139 mEq/L (ref 135–145)

## 2012-02-22 LAB — CBC
MCH: 27.2 pg (ref 26.0–34.0)
MCHC: 32.4 g/dL (ref 30.0–36.0)
Platelets: 197 10*3/uL (ref 150–400)
RBC: 3.38 MIL/uL — ABNORMAL LOW (ref 4.22–5.81)
RDW: 15.6 % — ABNORMAL HIGH (ref 11.5–15.5)

## 2012-02-22 LAB — DIFFERENTIAL
Basophils Relative: 0 % (ref 0–1)
Eosinophils Absolute: 0.6 10*3/uL (ref 0.0–0.7)
Eosinophils Relative: 6 % — ABNORMAL HIGH (ref 0–5)
Monocytes Relative: 5 % (ref 3–12)
Neutrophils Relative %: 77 % (ref 43–77)

## 2012-02-22 LAB — GLUCOSE, CAPILLARY
Glucose-Capillary: 156 mg/dL — ABNORMAL HIGH (ref 70–99)
Glucose-Capillary: 227 mg/dL — ABNORMAL HIGH (ref 70–99)
Glucose-Capillary: 24 mg/dL — CL (ref 70–99)
Glucose-Capillary: 258 mg/dL — ABNORMAL HIGH (ref 70–99)
Glucose-Capillary: 323 mg/dL — ABNORMAL HIGH (ref 70–99)
Glucose-Capillary: 71 mg/dL (ref 70–99)

## 2012-02-22 MED ORDER — ALTEPLASE 2 MG IJ SOLR
4.0000 mg | Freq: Once | INTRAMUSCULAR | Status: AC
  Administered 2012-02-22: 2 mg
  Filled 2012-02-22: qty 4

## 2012-02-22 MED ORDER — DEXTROSE 50 % IV SOLN
INTRAVENOUS | Status: AC
  Filled 2012-02-22: qty 50

## 2012-02-22 MED ORDER — POTASSIUM CHLORIDE 20 MEQ/15ML (10%) PO LIQD
40.0000 meq | Freq: Two times a day (BID) | ORAL | Status: DC
  Administered 2012-02-22 – 2012-02-23 (×3): 40 meq
  Filled 2012-02-22 (×3): qty 30

## 2012-02-22 MED ORDER — ALTEPLASE 100 MG IV SOLR: 4.0000 mg | Freq: Once | INTRAVENOUS | Status: DC

## 2012-02-22 MED ORDER — INSULIN ASPART 100 UNIT/ML ~~LOC~~ SOLN
0.0000 [IU] | Freq: Four times a day (QID) | SUBCUTANEOUS | Status: DC
  Administered 2012-02-22: 5 [IU] via SUBCUTANEOUS
  Administered 2012-02-23: 7 [IU] via SUBCUTANEOUS
  Administered 2012-02-23: 9 [IU] via SUBCUTANEOUS

## 2012-02-22 MED ORDER — DEXTROSE 5 % IV SOLN
INTRAVENOUS | Status: DC
  Administered 2012-02-22: 08:00:00 via INTRAVENOUS

## 2012-02-22 MED ORDER — INSULIN NPH (HUMAN) (ISOPHANE) 100 UNIT/ML ~~LOC~~ SUSP
8.0000 [IU] | Freq: Three times a day (TID) | SUBCUTANEOUS | Status: DC
  Administered 2012-02-22 – 2012-02-23 (×2): 8 [IU] via SUBCUTANEOUS

## 2012-02-22 NOTE — Progress Notes (Signed)
Subjective:  Patient alert. Nonverbal. Nurse, Janett Billow in room.  Objective: Vital signs in last 24 hours: Temp:  [97.6 F (36.4 C)-99.1 F (37.3 C)] 97.9 F (36.6 C) (06/21 0834) Pulse Rate:  [78-98] 98  (06/21 0834) Resp:  [20] 20  (06/21 0834) BP: (98-170)/(59-89) 113/62 mmHg (06/21 0834) SpO2:  [94 %-100 %] 94 % (06/21 0834) Last BM Date: 02/22/12 General:   Alert,  Well-developed, well-nourished, pleasant and cooperative in NAD Head:  Normocephalic and atraumatic. Eyes:  Sclera clear, no icterus.  Abdomen:  Soft, nontender and nondistended. PEG site without erythema or drainage. Bumper loosened by 1cm from the 2 mark to the 3 mark. Extremities:  Without clubbing, deformity or edema. Neurologic:  Alert. Skin:  Intact without significant lesions or rashes. Psych:  Alert and cooperative.   Intake/Output from previous day: 06/20 0701 - 06/21 0700 In: -  Out: 2725 [Urine:2725] Intake/Output this shift: Total I/O In: 210 [I.V.:10; Other:200] Out: -   Lab Results: CBC  Basename 02/22/12 0522 02/21/12 0518 02/20/12 0429  WBC 10.5 11.8* 14.4*  HGB 9.2* 9.6* 9.0*  HCT 28.4* 29.3* 28.1*  MCV 84.0 85.2 85.4  PLT 197 178 171   BMET  Basename 02/22/12 0522 02/21/12 0518 02/20/12 0429  NA 139 142 140  K 3.7 3.5 4.1  CL 105 109 109  CO2 23 23 22   GLUCOSE 81 124* 350*  BUN 23 23 26*  CREATININE 1.64* 1.61* 1.70*  CALCIUM 7.5* 7.5* 7.0*   Assessment: S/p PEG placement by IR. Tolerating feedings, currently at 40cc/hr. Increased planned as per orders.   Plan: 1. I loosened PEG bumper by 1cm today. Site looks good. Patient tolerating feedings.  2. D/C diet. Patient still getting tray. 3. Will sign off. Call with questions.    LOS: 32 days   Neil Crouch  02/22/2012, 9:54 AM

## 2012-02-22 NOTE — Progress Notes (Signed)
Patient ID: [name], male   DOB: March 13, 1961, 51 y.o.   MRN: [id number]

## 2012-02-22 NOTE — Progress Notes (Signed)
Subjective: Interval History: none.  Objective: Vital signs in last 24 hours: Temp:  [97.6 F (36.4 C)-99.1 F (37.3 C)] 99.1 F (37.3 C) (06/21 0542) Pulse Rate:  [78-97] 78  (06/21 0542) Resp:  [20] 20  (06/21 0542) BP: (98-170)/(59-89) 98/59 mmHg (06/21 0542) SpO2:  [95 %-100 %] 100 % (06/21 0542) Weight change:   Intake/Output from previous day: 06/20 0701 - 06/21 0700 In: -  Out: 2725 [Urine:2725] Intake/Output this shift:    General appearance: alert Resp: clear to auscultation bilaterally Cardio: regular rate and rhythm, S1, S2 normal, no murmur, click, rub or gallop GI: soft, non-tender; bowel sounds normal; no masses,  no organomegaly Extremities: extremities normal, atraumatic, no cyanosis or edema  Lab Results:  [facility] 02/22/12 0522 02/21/12 0518  WBC 10.5 11.8*  HGB 9.2* 9.6*  HCT 28.4* 29.3*  PLT 197 178   BMET:  Basename 02/22/12 0522 02/21/12 0518  NA 139 142  K 3.7 3.5  CL 105 109  CO2 23 23  GLUCOSE 81 124*  BUN 23 23  CREATININE 1.64* 1.61*  CALCIUM 7.5* 7.5*   No results found for this basename: PTH:2 in the last 72 hours Iron Studies: No results found for this basename: IRON,TIBC,TRANSFERRIN,FERRITIN in the last 72 hours  Studies/Results: Ir Gastrostomy Tube Mod Sed  02/21/2012  *RADIOLOGY REPORT*  Clinical Data:  Mental retardation and aspiration pneumonia.  The patient requires a gastrostomy tube.  PERCUTANEOUS GASTROSTOMY TUBE PLACEMENT  Sedation:  2.0 mg IV Versed;  50 mcg IV Fentanyl.  Total Moderate Sedation Time: 10 minutes.  Contrast:  10 ml Omnipaque-300  Additional Medications:  1 gram IV Ancef.  As antibiotic prophylaxis, Ancef 1 gm was ordered pre-procedure and administered intravenously within one hour of incision.  Fluoroscopy Time: 3.1 minutes.  Procedure:  The procedure, risks, benefits, and alternatives were explained to the patient.  Questions regarding the procedure were encouraged and answered.  The patient understands and  consents to the procedure.  The evening prior to the procedure, the patient was given thin liquid barium to ingest in order to opacify the colon.  The throat was sprayed with topical anesthetic.  A 5-French catheter was then advanced through the patient's mouth under fluoroscopy into the esophagus and to the level of the stomach.  This catheter was used to insufflate the stomach with air under fluoroscopy.  The abdominal wall was prepped with Betadine in a sterile fashion, and a sterile drape was applied covering the operative field.  A sterile gown and sterile gloves were used for the procedure. Local anesthesia was provided with 1% Lidocaine.  A skin incision was made in the upper abdominal wall.  Under fluoroscopy, an 18 gauge trocar needle was advanced into the stomach.  Contrast injection was performed to confirm intraluminal position of the needle tip.  A single T tack was then deployed in the lumen of the stomach.  This was brought up to tension at the skin surface.  Over a guidewire, a 9-French sheath was advanced into the lumen of the stomach.  The wire was left in place as a safety wire.  A loop snare device from a percutaneous gastrostomy kit was then advanced into the stomach.  A floppy guide wire was advanced through the orogastric catheter under fluoroscopy in the stomach.  The loop snare advanced through the percutaneous gastric access was used to snare the guide wire. This allowed withdrawal of the loop snare out of the patient's mouth by retraction of the orogastric  catheter and wire.  A 20-French bumper retention gastrostomy tube was looped around the snare device.  It was then pulled back through the patient's mouth. The retention bumper was brought up to the anterior gastric wall. The T tack suture was cut at the skin.  The exiting gastrostomy tube was cut to appropriate length and a feeding adapter applied. The catheter was injected with contrast material to confirm position and a fluoroscopic  spot image saved.  The tube was then flushed with saline.  A dressing was applied over the gastrostomy exit sit  Complications:  None  Findings:  Initial fluoroscopy demonstrates adequate opacification of the colon by ingested barium in order to prevent colonic injury during the procedure.  The stomach distended well with air allowing safe placement of the gastrostomy tube.  After placement, the tip of the gastrostomy tube lies in the body of the stomach.  IMPRESSION: Percutaneous gastrostomy with placement of a 20-French bumper retention tube in the body of the stomach.  This tube can be used for percutaneous feeds beginning in 24 hours after placement.  Original Report Authenticated By: Azzie Roup, M.D.   Dg Chest Port 1 View  02/21/2012  *RADIOLOGY REPORT*  Clinical Data: Pneumonia.  PORTABLE CHEST - 1 VIEW  Comparison: 02/16/2012.  Findings: The PICC line is stable.  The lung shows slight improved aeration with decreasing airspace consolidation in both lungs.  No pleural effusions.  No pneumothorax.  IMPRESSION: Persistent but improving bilateral infiltrates.  Original Report Authenticated By: P. Kalman Jewels, M.D.    I have reviewed the patient's current medications.  Assessment/Plan: Problem #1 renal failure his BUN and creatinine at this moment is stable. Patient is none oliguric. Problem #2 hypokalemia potassium has corrected Problem #3 history of diabetes Problem #4 history of BPH Problem #5 history of anemia Problem #6 history of her pneumonia patient a febrile her white blood cell count also improved. Problem #7 history of mental retardation. Plan: We'll continue his present management. Since her his renal function has returned to his base line I will sign off  Thank you    LOS: 32 days   Tierany Appleby S 02/22/2012,7:05 AM

## 2012-02-22 NOTE — Progress Notes (Signed)
Pt's blood sugar 24 on fingerstick. 1/2 amp D50 given IV. Pt's blood sugar up to 120 after D50.

## 2012-02-22 NOTE — Progress Notes (Signed)
NAME:  [name], [name]           ACCOUNT NO.:  [id number]  MEDICAL RECORD NO.:  [id number]  LOCATION:  A306                          FACILITY:  [facility]  PHYSICIAN:  Oluwatobiloba Martin D. Legrand Rams, MD   DATE OF BIRTH:  Jan 19, 1961  DATE OF PROCEDURE:  02/22/2012 DATE OF DISCHARGE:                                PROGRESS NOTE   SUBJECTIVE:  The patient is unable to communicate verbally.  However, he is progressively improving.  OBJECTIVE:  GENERAL:  The patient is alert, awake, and resting.  His fever has subsided.  His breathing is also improving.  The patient is receiving tube feeding. VITAL SIGNS:  Blood pressure 98/59, pulse 78, respiratory rate 20, temperature 97.6 degrees Fahrenheit. CHEST:  Poor air entry, bilateral rhonchi. CARDIOVASCULAR SYSTEM:  First and second heart sounds heard.  No murmur. No gallop. ABDOMEN:  Soft and lax.  Bowel sound is positive.  No mass or organomegaly. EXTREMITIES:  No leg edema.  LABORATORY DATA:  Sodium 139, potassium 3.7, chloride 105, carbon dioxide 23, glucose 81, BUN 23, creatinine 1.69.  CBC:  WBC 10.5, hemoglobin 9.2, hematocrit 28.4, and platelets 197,000.  Chest x-ray today shows persistent bilateral infiltrates with some improvement.  ASSESSMENT: 1. History of sepsis. 2. Bilateral pneumonia. 3. Dysphagia. 4. Acute on chronic renal failure. 5. Diabetes mellitus. 6. Mental retardation.  PLAN:  We will continue the patient on IV antibiotics.  We will continue tube feeding.  Continue supportive care.  We will plan to discharge the patient to nursing home if he continued to improve.     Brenson Hartman D. Legrand Rams, MD     TDF/MEDQ  D:  02/22/2012  T:  02/22/2012  Job:  [id number]

## 2012-02-22 NOTE — Progress Notes (Signed)
[facility] to confirm a possible admission on tomorrow- they can accept her on the weekend if medically ready- the patient's Guardian, DSS, will coordinate admission paperwork being done today. Will ask weekend CSW to follow up in morning for further d/c arrangements. Eduard Clos, MSW, [place]

## 2012-02-22 NOTE — Progress Notes (Signed)
REVIEWED. AGREE. 

## 2012-02-22 NOTE — Progress Notes (Signed)
Nutrition Follow-up   Diet Order: NPO. Pt is tol continuous TF of Jev 1.2 currently @ 50 ml/hr and ProStat 30 ml BID providing: 1640 kcal, 97 gr protein, 968 ml water. Flushes 200 ml water q 6h. Adequate to meet 86% energy, 100% minimum est protein needs.  Meds: Scheduled Meds:   . albuterol  2.5 mg Nebulization BID   And  . ipratropium  0.5 mg Nebulization BID  . alteplase  4 mg Intracatheter Once  . antiseptic oral rinse  15 mL Mouth Rinse QID  . chlorhexidine  15 mL Mouth Rinse BID  . dextrose      . enoxaparin (LOVENOX) injection  30 mg Subcutaneous Q24H  . feeding supplement  30 mL Oral TID WC  . insulin aspart  0-9 Units Subcutaneous Q4H  . insulin NPH  5 Units Subcutaneous Q8H  . pantoprazole sodium  40 mg Per Tube BID AC  . piperacillin-tazobactam (ZOSYN)  IV  3.375 g Intravenous Q8H  . polyethylene glycol  17 g Oral q morning - 10a  . potassium chloride  40 mEq Per Tube BID  . sertraline  150 mg Oral Daily  . sodium chloride  10-40 mL Intracatheter Q12H  . Tamsulosin HCl  0.4 mg Oral Daily  . traZODone  50 mg Oral QHS  . vancomycin  1,000 mg Intravenous Q24H  . ziprasidone  40 mg Oral BID WC  . DISCONTD: alteplase  4 mg Intracatheter Once  . DISCONTD: potassium chloride  40 mEq Oral BID   Continuous Infusions:   . dextrose 50 mL/hr at 02/22/12 0820  . feeding supplement (JEVITY 1.2 CAL) 1,000 mL (02/21/12 1749)   PRN Meds:.acetaminophen, albuterol, ALPRAZolam, alum & mag hydroxide-simeth, dextrose, dextrose, hyoscyamine, LORazepam, morphine injection, ondansetron, sodium chloride  Labs:  CMP     Component Value Date/Time   NA 139 02/22/2012 0522   K 3.7 02/22/2012 0522   CL 105 02/22/2012 0522   CO2 23 02/22/2012 0522   GLUCOSE 81 02/22/2012 0522   BUN 23 02/22/2012 0522   CREATININE 1.64* 02/22/2012 0522   CALCIUM 7.5* 02/22/2012 0522   PROT 5.5* 02/19/2012 0448   ALBUMIN 1.8* 02/19/2012 0448   AST 29 02/19/2012 0448   ALT 26 02/19/2012 0448   ALKPHOS 94 02/19/2012  0448   BILITOT 0.9 02/19/2012 0448   GFRNONAA 47* 02/22/2012 0522   GFRAA 55* 02/22/2012 0522   CBG (last 3)   Basename 02/22/12 0712 02/22/12 0409 02/22/12 0034  GLUCAP 156* 71 120*      Intake/Output Summary (Last 24 hours) at 02/22/12 1030 Last data filed at 02/22/12 0830  Gross per 24 hour  Intake    210 ml  Output   2725 ml  Net  -2515 ml    Weight Status: 149# (02/20/12)  Estimated needs:  1904-2040 kcal/day  95-105 gr/day  1 ml/kcal   Nutrition Dx: Inadequate oral intake-ongoing r/t swallow difficulty AEB NPO status and PEG tube placement. In addition to: Increased nutrient needs r/t wound healing.  Goal: Pt will consume >80% of est energy and protein needs. Goal met.  Intervention:   Recommend daily wt monitoring At goal rate of 60 ml/hr EN will  provide: 1928 kcal, 110 gr protein daily which meets 100% est energy and protein requirements.  Will follow for nutritional needs.    Frederik Schmidt  Pager # [phone]

## 2012-02-22 NOTE — Progress Notes (Signed)
02/22/12 1536 Notified Dr Legrand Rams of CBGs in the 200s this afternoon while on D5W at 50 ml/hr. Stated okay to d/c D5W and continue to monitor CBGs as ordered. Nursing to monitor. Cindie Crumbly

## 2012-02-22 NOTE — Progress Notes (Signed)
ANTIBIOTIC CONSULT NOTE   Pharmacy Consult for Vancomycin and Zosyn Indication: pneumonia  No Known Allergies  Patient Measurements: Height: 5\' 10"  (177.8 cm) Weight: 149 lb 4 oz (67.7 kg) IBW/kg (Calculated) : 73   Vital Signs: Temp: 97.9 F (36.6 C) (06/21 0834) Temp src: Oral (06/21 0834) BP: 113/62 mmHg (06/21 0834) Pulse Rate: 98  (06/21 0834) Intake/Output from previous day: 06/20 0701 - 06/21 0700 In: -  Out: 2725 [Urine:2725] Intake/Output from this shift: Total I/O In: 923.7 [I.V.:196.7; Other:477; IV Piggyback:250] Out: -   Labs:  Basename 02/22/12 0522 02/21/12 0518 02/20/12 0429  WBC 10.5 11.8* 14.4*  HGB 9.2* 9.6* 9.0*  PLT 197 178 171  LABCREA -- -- --  CREATININE 1.64* 1.61* 1.70*   Estimated Creatinine Clearance: 51.6 ml/min (by C-G formula based on Cr of 1.64). No results found for this basename: VANCOTROUGH:2,VANCOPEAK:2,VANCORANDOM:2,GENTTROUGH:2,GENTPEAK:2,GENTRANDOM:2,TOBRATROUGH:2,TOBRAPEAK:2,TOBRARND:2,AMIKACINPEAK:2,AMIKACINTROU:2,AMIKACIN:2, in the last 72 hours  Microbiology: Recent Results (from the past 720 hour(s))  CULTURE, BLOOD (ROUTINE X 2)     Status: Normal   Collection Time   01/24/12  4:59 PM      Component Value Range Status Comment   Specimen Description BLOOD RIGHT HAND   Final    Special Requests BOTTLES DRAWN AEROBIC AND ANAEROBIC 8CC   Final    Culture NO GROWTH 5 DAYS   Final    Report Status 01/29/2012 FINAL   Final   CULTURE, BLOOD (ROUTINE X 2)     Status: Normal   Collection Time   01/24/12  5:03 PM      Component Value Range Status Comment   Specimen Description BLOOD LEFT HAND   Final    Special Requests BOTTLES DRAWN AEROBIC AND ANAEROBIC 8CC   Final    Culture NO GROWTH 5 DAYS   Final    Report Status 01/29/2012 FINAL   Final   CLOSTRIDIUM DIFFICILE BY PCR     Status: Normal   Collection Time   01/27/12  9:00 PM      Component Value Range Status Comment   C difficile by pcr NEGATIVE  NEGATIVE Final     CULTURE, BLOOD (ROUTINE X 2)     Status: Normal   Collection Time   01/30/12 10:24 AM      Component Value Range Status Comment   Specimen Description BLOOD RIGHT HAND   Final    Special Requests BOTTLES DRAWN AEROBIC AND ANAEROBIC 6CC    Final    Culture NO GROWTH 5 DAYS   Final    Report Status 02/04/2012 FINAL   Final   CULTURE, BLOOD (ROUTINE X 2)     Status: Normal   Collection Time   01/30/12 10:44 AM      Component Value Range Status Comment   Specimen Description BLOOD LEFT HAND   Final    Special Requests BOTTLES DRAWN AEROBIC AND ANAEROBIC 5CC   Final    Culture NO GROWTH 5 DAYS   Final    Report Status 02/04/2012 FINAL   Final   CULTURE, BLOOD (ROUTINE X 2)     Status: Normal   Collection Time   02/16/12  4:24 AM      Component Value Range Status Comment   Specimen Description BLOOD PICC LINE DRAWN BY RN   Final    Special Requests BOTTLES DRAWN AEROBIC AND ANAEROBIC [place]   Final    Culture NO GROWTH 5 DAYS   Final    Report Status 02/21/2012 FINAL   Final  CULTURE, BLOOD (ROUTINE X 2)     Status: Normal   Collection Time   02/16/12  4:24 AM      Component Value Range Status Comment   Specimen Description BLOOD RIGHT HAND   Final    Special Requests BOTTLES DRAWN AEROBIC AND ANAEROBIC 6CC   Final    Culture NO GROWTH 5 DAYS   Final    Report Status 02/21/2012 FINAL   Final   URINE CULTURE     Status: Normal   Collection Time   02/16/12  4:33 AM      Component Value Range Status Comment   Specimen Description URINE, CATHETERIZED   Final    Special Requests NONE   Final    Culture  Setup Time [id number]   Final    Colony Count 85,000 COLONIES/ML   Final    Culture YEAST   Final    Report Status 02/17/2012 FINAL   Final    Medical History: Past Medical History  Diagnosis Date  . Renal disorder   . Elective mutism   . Autism   . Hyperosmolar (nonketotic) coma   . Hyperglycemia   . Diabetes mellitus   . Hyponatremia   . Mental retardation   . Urinary  retention   . BPH (benign prostatic hyperplasia)   . Gastroparesis   . Depression   . Pneumonia   . UTI (urinary tract infection)   . Mental retardation    Medications:  Scheduled:     . albuterol  2.5 mg Nebulization BID   And  . ipratropium  0.5 mg Nebulization BID  . alteplase  4 mg Intracatheter Once  . antiseptic oral rinse  15 mL Mouth Rinse QID  . chlorhexidine  15 mL Mouth Rinse BID  . dextrose      . enoxaparin (LOVENOX) injection  30 mg Subcutaneous Q24H  . feeding supplement  30 mL Oral TID WC  . insulin aspart  0-9 Units Subcutaneous Q4H  . insulin NPH  5 Units Subcutaneous Q8H  . pantoprazole sodium  40 mg Per Tube BID AC  . piperacillin-tazobactam (ZOSYN)  IV  3.375 g Intravenous Q8H  . polyethylene glycol  17 g Oral q morning - 10a  . potassium chloride  40 mEq Per Tube BID  . sertraline  150 mg Oral Daily  . sodium chloride  10-40 mL Intracatheter Q12H  . Tamsulosin HCl  0.4 mg Oral Daily  . traZODone  50 mg Oral QHS  . vancomycin  1,000 mg Intravenous Q24H  . ziprasidone  40 mg Oral BID WC  . DISCONTD: alteplase  4 mg Intracatheter Once  . DISCONTD: potassium chloride  40 mEq Oral BID   Assessment: Pt on day #7 empiric, broad-spectrum antibiotic regimen with Vancomycin and Zosyn for sepsis.  All cx data is negative. Renal function is stable. Vancomycin trough was therapeutic.   Goal of Therapy:  Vancomycin trough level 15-20 mcg/ml  Plan:  1) Continue Zosyn 3.375 GM IV every 8 hours & Vancomycin 1 GM IV every 24 hours 2) Repeat weekly vancomycin trough, Scr on Tues if medication continues. 3)  Consider d/c IV antibiotics since pt is clinically improved and completed recommended length of therapy.  [name] 02/22/2012,2:24 PM

## 2012-02-22 NOTE — Progress Notes (Signed)
NAMEZYMARION, DELPRADO           ACCOUNT NO.:  [id number]  MEDICAL RECORD NO.:  [id number]  LOCATION:  A306                          FACILITY:  [facility]  PHYSICIAN:  Loni Beckwith, MD DATE OF BIRTH:  [birth year]  DATE OF PROCEDURE:  02/22/2012 DATE OF DISCHARGE:                                PROGRESS NOTE   REASON FOR FOLLOWUP:  Uncontrolled type 1 diabetes.  SUBJECTIVE:  The patient is currently on Jevity tube feedings through his PEG tube.  He is tolerating at 60 mL/h.  Over the last 24 hours, he had an episode of hypoglycemia overnight which required dextrose support, currently on NPH 5 units every 8 hours and NovoLog sliding scale.  His subsequent blood sugar is, however, above target.  OBJECTIVE:  GENERAL:  The patient is not in distress, resting very well in his hospital bed, still nonverbal. VITAL SIGNS:  Blood pressure of 125/74, pulse rate 82, temperature 98.1. HEENT:  Well hydrated. CHEST:  Clear to auscultation. CARDIOVASCULAR:  Distant heart sounds.  No murmur.  No gallop. ABDOMEN:  Significant for PEG tube in place.  Positive bowel sounds. EXTREMITIES:  No edema.  His most recent blood work shows sodium 139, potassium 3.7, chloride 105, bicarb 23, BUN 23, creatinine 1.64.  ASSESSMENT: 1. Type 1 diabetes, clinically uncontrolled, A1c 8%. 2. Respiratory failure, status post mechanical ventilation. 3. Dysphagia, status post PEG tube placement. 4. Bilateral pneumonia, status post therapy.  PLAN:  The hypoglycemia episodes last night was due to inadequate oral intake and during the transition of his feeding to current way of PEG tube.  He is anticipated to have hyperglycemia going forward as long as he tolerates current level of feeding at 60 mL/h or more.  I will proceed in anticipation to increase his basal insulin to 80 units of NPH every 8 hours while NovoLog will be used every 6 hours to correct unexpected hyperglycemia.  Close followup is still needed.  I  will continue to follow him and adjust his insulin accordingly.          ______________________________ Loni Beckwith, MD     GN/MEDQ  D:  02/22/2012  T:  02/22/2012  Job:  [id number]

## 2012-02-23 LAB — BASIC METABOLIC PANEL
Calcium: 7.8 mg/dL — ABNORMAL LOW (ref 8.4–10.5)
Creatinine, Ser: 1.63 mg/dL — ABNORMAL HIGH (ref 0.50–1.35)
GFR calc non Af Amer: 48 mL/min — ABNORMAL LOW (ref >90)
Sodium: 136 mEq/L (ref 135–145)

## 2012-02-23 LAB — GLUCOSE, CAPILLARY
Glucose-Capillary: 152 mg/dL — ABNORMAL HIGH (ref 70–99)
Glucose-Capillary: 314 mg/dL — ABNORMAL HIGH (ref 70–99)

## 2012-02-23 LAB — DIFFERENTIAL
Basophils Absolute: 0 10*3/uL (ref 0.0–0.1)
Basophils Relative: 0 % (ref 0–1)
Eosinophils Absolute: 0.6 10*3/uL (ref 0.0–0.7)
Neutro Abs: 5.5 10*3/uL (ref 1.7–7.7)
Neutrophils Relative %: 68 % (ref 43–77)

## 2012-02-23 LAB — CBC
Hemoglobin: 8.9 g/dL — ABNORMAL LOW (ref 13.0–17.0)
MCH: 27.6 pg (ref 26.0–34.0)
MCHC: 32.8 g/dL (ref 30.0–36.0)
Platelets: 208 10*3/uL (ref 150–400)
RBC: 3.22 MIL/uL — ABNORMAL LOW (ref 4.22–5.81)

## 2012-02-23 MED ORDER — JEVITY 1.2 CAL PO LIQD: 1000.0000 mL | ORAL | Status: DC

## 2012-02-23 MED ORDER — INSULIN ASPART 100 UNIT/ML ~~LOC~~ SOLN: 15.0000 [IU] | Freq: Once | SUBCUTANEOUS | Status: DC

## 2012-02-23 MED ORDER — AMOXICILLIN-POT CLAVULANATE 500-125 MG PO TABS: 1.0000 | ORAL_TABLET | Freq: Three times a day (TID) | ORAL | Status: AC

## 2012-02-23 NOTE — Plan of Care (Signed)
Problem: Phase I Progression Outcomes Goal: Initial discharge plan identified Outcome: Progressing According to report, pt plans to be d/c to the [facility].

## 2012-02-23 NOTE — Discharge Summary (Signed)
NAME:  JEF, HOGUE           ACCOUNT NO.:  [id number]  MEDICAL RECORD NO.:  [id number]  LOCATION:  A306                          FACILITY:  [facility]  PHYSICIAN:  Devin Ganaway D. Legrand Rams, MD   DATE OF BIRTH:  11-Sep-1960  DATE OF ADMISSION:  01/21/2012 DATE OF DISCHARGE:  LH                              DISCHARGE SUMMARY   DISCHARGE DIAGNOSES: 1. Ventilatory-dependent respiratory failure. 2. Bilateral pneumonia. 3. Acute on chronic renal failure. 4. Diabetic ketoacidosis. 5. Episode of hypoglycemia. 6. Acute on chronic renal failure. 7. Urinary tract infection. 8. Mental retardation. 9. Dysphagia. 10.Status post PEG tube placement. 11.Anemia.  DISCHARGE MEDICATIONS: 1. Tylenol 650 mg p.o. q.6 hours p.r.n. 2. Augmentin 500 mg t.i.d. for 7 days. 3. Jevity 1.2 at 60 mL/h. 4. Flomax 0.4 mg daily. 5. Levsin 0.125 mg q.4 hours p.r.n. 6. Lantus insulin 30 units at bedtime. 7. Accu-Chek with sliding scale. 8. Lisinopril 10 mg daily. 9. MiraLAX 17 g daily as needed. 10.Zantac 150 mg 2 tablets b.i.d. 11.Zoloft 150 mg daily. 12.Trazodone 50 mg at bedtime. 13.Geodon 40 mg p.o. b.i.d.  DISPOSITION:  The patient will be transferred to nursing home.  DISCHARGE INSTRUCTIONS:  The patient will continue skilled nursing care. He will continue tube feeding and the current medications.  LABS ON DISCHARGE:  CBC:  WBC 8.0, hemoglobin 8.9, hematocrit 27.1, and platelets 208.  BMP:  Sodium 136, potassium 4.2, chloride 102 carbon dioxide 24, glucose 144, BUN 29, creatinine 1.63, and calcium 7.3.  HOSPITAL COURSE:  This is a 51 years old male patient with history of multiple medical illnesses including severe mental retardation, was admitted from a local assisted living due to severe hypoglycemia.  The patient was also found to have respiratory distress and chest x-ray showed bilateral pneumonia.  After admission, the patient developed severe respiratory failure requiring ventilatory support.  The  patient has multiple complication during the hospital stay, and they had a prolonged hospital course.  He was treated with combination of antibiotics for sepsis.  He was also treated for diabetic ketoacidosis as well as acute on chronic renal failure.  Pulmonologist and endocrinologist and nephrologist followed the patient during the hospital stay.  He gradually improved.  However, he had severe dysphagia requiring tube feeding.  The patient is currently stable to be transferred to nursing home.     Ivon Oelkers D. Legrand Rams, MD     TDF/MEDQ  D:  02/23/2012  T:  02/23/2012  Job:  [id number]

## 2012-02-23 NOTE — Clinical Social Work Note (Signed)
CSW notified patient is ready for DC to [facility]. CSW faxed DC paperwork to [facility] and nurse called report. CSW arranged DC packet and called non emergency ambulance to safely transport patient to SNF. No other needs. CSW signing off.  Polo Riley, LCSWA Weekend coverage

## 2012-02-23 NOTE — Progress Notes (Signed)
Pt tearful.  Asked pt if he is in pain, and he shook his head "no."  Asked pt if he is upset about being discharged to [facility], and he shook his head "yes."  Nurse provided pt with emotional support.  Pt receptive.

## 2012-02-23 NOTE — Progress Notes (Addendum)
Notified of CBG of 407. Notified physician per protocol, order received to give 15 units of Novolog now. Lab also notified do perform stat lab verification. [name], [name]: paged Dr. Legrand Rams. Results back from lab, CBG 396, I was ordered to disregard the previous order for 15 units and give what the sliding scale says since the lab result was less than 400.  [name]

## 2012-02-23 NOTE — Progress Notes (Signed)
Report called to [facility] in [place].  Pt G-tube flushed and clamped.

## 2012-02-25 ENCOUNTER — Other Ambulatory Visit (HOSPITAL_COMMUNITY): Payer: Self-pay | Admitting: Physician Assistant

## 2012-02-25 DIAGNOSIS — T17998A Other foreign object in respiratory tract, part unspecified causing other injury, initial encounter: Secondary | ICD-10-CM

## 2012-02-25 DIAGNOSIS — F73 Profound intellectual disabilities: Secondary | ICD-10-CM

## 2012-02-25 MED ORDER — IOHEXOL 300 MG/ML  SOLN: 50.0000 mL | Freq: Once | INTRAMUSCULAR | Status: DC | PRN

## 2017-09-13 ENCOUNTER — Ambulatory Visit (INDEPENDENT_AMBULATORY_CARE_PROVIDER_SITE_OTHER): Payer: Medicaid Other | Admitting: Urology

## 2017-09-13 DIAGNOSIS — N35011 Post-traumatic bulbous urethral stricture: Secondary | ICD-10-CM

## 2018-10-15 ENCOUNTER — Ambulatory Visit (INDEPENDENT_AMBULATORY_CARE_PROVIDER_SITE_OTHER): Payer: Medicaid Other | Admitting: Urology

## 2018-10-15 DIAGNOSIS — N35011 Post-traumatic bulbous urethral stricture: Secondary | ICD-10-CM

## 2019-03-25 ENCOUNTER — Other Ambulatory Visit: Payer: Self-pay

## 2019-03-25 ENCOUNTER — Encounter: Payer: Medicaid Other | Admitting: Vascular Surgery

## 2019-03-25 ENCOUNTER — Telehealth (HOSPITAL_COMMUNITY): Payer: Self-pay

## 2019-03-25 ENCOUNTER — Encounter (HOSPITAL_COMMUNITY): Payer: Medicaid Other

## 2019-03-25 ENCOUNTER — Telehealth: Payer: Self-pay | Admitting: Vascular Surgery

## 2019-03-25 DIAGNOSIS — S91302A Unspecified open wound, left foot, initial encounter: Secondary | ICD-10-CM

## 2019-03-25 NOTE — Telephone Encounter (Signed)
I called [employer]  regarding status of LE Arterial authorization.   Per Arcola Jansky., this case is still pending.  I requested to change the status to urgent since the patient is scheduled to have it done tomorrow morning.   Claiborne Billings verbalized understanding and put in an urgent request and says I can check the status late this afternoon.   Pending Case #[id number].  [phone]).   Thurston Hole., LPN

## 2019-03-25 NOTE — Telephone Encounter (Signed)
    Provided telephone number [phone] was for [facility] department of human services.

## 2019-03-26 ENCOUNTER — Ambulatory Visit (INDEPENDENT_AMBULATORY_CARE_PROVIDER_SITE_OTHER): Payer: Medicaid Other | Admitting: Vascular Surgery

## 2019-03-26 ENCOUNTER — Encounter: Payer: Self-pay | Admitting: Vascular Surgery

## 2019-03-26 ENCOUNTER — Encounter: Payer: Self-pay | Admitting: *Deleted

## 2019-03-26 ENCOUNTER — Other Ambulatory Visit: Payer: Self-pay

## 2019-03-26 ENCOUNTER — Ambulatory Visit (HOSPITAL_COMMUNITY)
Admission: RE | Admit: 2019-03-26 | Discharge: 2019-03-26 | Disposition: A | Discharge status: Home / Self Care | Payer: Medicaid Other | Source: Ambulatory Visit | Attending: Family | Admitting: Family

## 2019-03-26 VITALS — BP 95/52 | HR 80 | Temp 97.3°F | Resp 18 | Ht 70.0 in

## 2019-03-26 DIAGNOSIS — S91302A Unspecified open wound, left foot, initial encounter: Secondary | ICD-10-CM | POA: Diagnosis not present

## 2019-03-26 DIAGNOSIS — I70262 Atherosclerosis of native arteries of extremities with gangrene, left leg: Secondary | ICD-10-CM | POA: Diagnosis not present

## 2019-03-26 NOTE — Progress Notes (Signed)
Letter of instructions for 04/30/2019 procedure with time change faxed to [facility] [place]. To arrive at 8 am. Legal guardian and social worker Betsey Amen informed and understood she is to be at hospital for procedure.

## 2019-03-26 NOTE — Progress Notes (Signed)
REASON FOR CONSULT:    Nonhealing heel ulcer.  The consult is requested by Dr. Ladona Horns.  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  I think he is at significant risk for this not healing without revascularization and for this reason I have recommended arteriography.  We have discussed the indications of the procedure and potential complications with the help of his caregiver [name] and he is agreeable to proceed.  I do not have any recent labs.  However he reportedly has some history of chronic kidney disease and if his GFR is significantly decreased we will consider doing the study with CO2 which somewhat limits visualization.  Hopefully will find an endovascular option given that with his debilitated state I think he would be at increased risk for surgery.  He is nonambulatory but feels quite strongly about trying to save his leg.  Oscar Mayo, MD, FACS Beeper [phone] Office: [phone]   HPI:   [name] is a pleasant 58 y.o. male, referred with a nonhealing left heel ulcer.  I have reviewed the records from the referring office.  The patient did undergo debridement on 02/26/2019.  I did find a noninvasive study that was done on 03/03/2019.  Ankle-brachial indices could not be obtained secondary to calcific disease.  The patient was noted to have monophasic waveforms bilaterally.  Toe pressures were not obtained.  I also reviewed the office notes from 02/24/2019 and 03/10/2019.  Patient has a left heel ulcer secondary to underlying type 2 diabetes and a pressure sore.  The history is obtained through his caregiver from the [facility]  He developed this wound at the end of May and the wound has gradually progressed.  He was seen in the wound care center and is undergone debridement.  He does not  have any significant pain associated with the wound.  He has had some issues with anemia and also reportedly has some chronic kidney disease.  Fortunately he is not a smoker.  Past Medical History:  Diagnosis Date  . Autism   . BPH (benign prostatic hyperplasia)   . Depression   . Diabetes mellitus   . Elective mutism   . Gastroparesis   . Hyperglycemia   . Hyperosmolar (nonketotic) coma ([name])   . Hyponatremia   . Mental retardation   . Mental retardation   . Pneumonia   . Renal disorder   . Urinary retention   . UTI (urinary tract infection)     History reviewed. No pertinent family history.  SOCIAL HISTORY: Social History   Socioeconomic History  . Marital status: Single    Spouse name: Not on file  . Number of children: Not on file  . Years of education: Not on file  . Highest education level: Not on file  Occupational History  . Not on file  Social Needs  . Financial resource strain: Not on file  . Food insecurity    Worry: Not on file    Inability: Not on file  . Transportation needs    Medical: Not on file    Non-medical: Not on file  Tobacco Use  . Smoking status: Never Smoker  . Smokeless tobacco: Never Used  Substance and Sexual Activity  . Alcohol use: No  . Drug use: No  . Sexual activity: Not on file  Lifestyle  .  Physical activity    Days per week: Not on file    Minutes per session: Not on file  . Stress: Not on file  Relationships  . Social Herbalist on phone: Not on file    Gets together: Not on file    Attends religious service: Not on file    Active member of club or organization: Not on file    Attends meetings of clubs or organizations: Not on file    Relationship status: Not on file  . Intimate partner violence    Fear of current or ex partner: Not on file    Emotionally abused: Not on file    Physically abused: Not on file    Forced sexual activity: Not on file  Other Topics Concern  . Not on file  Social History  Narrative  . Not on file    No Known Allergies  Current Outpatient Medications  Medication Sig Dispense Refill  . acetaminophen (TYLENOL) 325 MG tablet Take 650 mg by mouth every 6 (six) hours as needed. For pain    . amLODipine (NORVASC) 10 MG tablet Take 10 mg by mouth daily.    . Exenatide ER (BYDUREON) 2 MG PEN Inject 2 mg into the skin once a week.    . famotidine (PEPCID) 10 MG tablet Take 10 mg by mouth daily. 2 tablets in the AM daily    . ferrous sulfate 325 (65 FE) MG tablet Take 325 mg by mouth 2 (two) times daily with a meal.    . hydrALAZINE (APRESOLINE) 25 MG tablet Take 25 mg by mouth 3 (three) times daily.    Marland Kitchen HYDROcodone-acetaminophen (NORCO/VICODIN) 5-325 MG tablet Take 1 tablet by mouth every 6 (six) hours as needed for moderate pain.    Marland Kitchen insulin detemir (LEVEMIR) 100 unit/ml SOLN Inject 10 Units into the skin daily.    . insulin NPH-regular Human (70-30) 100 UNIT/ML injection Inject 15 Units into the skin daily with breakfast.    . levETIRAcetam (KEPPRA) 250 MG tablet Take 250 mg by mouth 2 (two) times daily.    Marland Kitchen levofloxacin (LEVAQUIN) 750 MG tablet Take 750 mg by mouth daily.    Marland Kitchen lisinopril (PRINIVIL,ZESTRIL) 10 MG tablet Take 10 mg by mouth every morning.     . mirtazapine (REMERON) 7.5 MG tablet Take 15 mg by mouth at bedtime.    . Multiple Vitamins-Minerals (MULTIVITAMIN WITH MINERALS) tablet Take 1 tablet by mouth daily.    . Nutritional Supplements (FEEDING SUPPLEMENT, JEVITY 1.2 CAL,) LIQD Place 1,000 mLs into feeding tube continuous. 60 mL 3  . pioglitazone (ACTOS) 45 MG tablet Take 45 mg by mouth daily.    . polyethylene glycol (MIRALAX / GLYCOLAX) packet Take 17 g by mouth every morning.     . sertraline (ZOLOFT) 50 MG tablet Take 200 mg by mouth every morning.     . sodium hypochlorite (DAKIN'S 1/2 STRENGTH) external solution Irrigate with 1 application as directed 2 (two) times daily.    . Tamsulosin HCl (FLOMAX) 0.4 MG CAPS Take 0.4 mg by mouth every  morning.     . vitamin C (ASCORBIC ACID) 500 MG tablet Take 500 mg by mouth 2 (two) times daily.    . ziprasidone (GEODON) 40 MG capsule Take 40 mg by mouth 2 (two) times daily with a meal.      . hyoscyamine (LEVSIN SL) 0.125 MG SL tablet Take 0.125-0.25 mg by mouth every 4 (four) hours as needed. Place  one or two tablets under tongue every 4 hours if needed for relief of abdominal pain.    Marland Kitchen insulin aspart (NOVOLOG) 100 UNIT/ML injection Inject 1-6 Units into the skin 3 (three) times daily as needed. Per sliding scale instructions: If 150-200= 1 unit, 201-250= 2 units, 251-300= 3 units, 301-350= 4 units, 351-400= 5 units, Greater than 400= 6 units// in addition to Sliding Scale    . insulin glargine (LANTUS) 100 UNIT/ML injection Inject 30 Units into the skin at bedtime.     . ranitidine (ZANTAC) 150 MG capsule Take 150 mg by mouth 2 (two) times daily.      . traZODone (DESYREL) 50 MG tablet Take 50 mg by mouth at bedtime.       No current facility-administered medications for this visit.     REVIEW OF SYSTEMS:  [X]  denotes positive finding, [ ]  denotes negative finding Cardiac  Comments:  Chest pain or chest pressure:    Shortness of breath upon exertion:    Short of breath when lying flat:    Irregular heart rhythm:        Vascular    Pain in calf, thigh, or hip brought on by ambulation:    Pain in feet at night that wakes you up from your sleep:     Blood clot in your veins:    Leg swelling:         Pulmonary    Oxygen at home:    Productive cough:     Wheezing:         Neurologic    Sudden weakness in arms or legs:     Sudden numbness in arms or legs:     Sudden onset of difficulty speaking or slurred speech:    Temporary loss of vision in one eye:     Problems with dizziness:         Gastrointestinal    Blood in stool:     Vomited blood:         Genitourinary    Burning when urinating:     Blood in urine:        Psychiatric    Major depression:          Hematologic    Bleeding problems:    Problems with blood clotting too easily:        Skin    Rashes or ulcers:        Constitutional    Fever or chills:     PHYSICAL EXAM:   Vitals:   03/26/19 1222  BP: (!) 95/52  Pulse: 80  Resp: 18  Temp: (!) 97.3 F (36.3 C)  TempSrc: Temporal  SpO2: 100%  Height: 5\' 10"  (1.778 m)    GENERAL: The patient is a well-nourished male, in no acute distress. The vital signs are documented above. CARDIAC: There is a regular rate and rhythm.  VASCULAR: I do not detect carotid bruits. He has palpable femoral pulses.  I cannot palpate popliteal or pedal pulses. PULMONARY: There is good air exchange bilaterally without wheezing or rales. ABDOMEN: Soft and non-tender with normal pitched bowel sounds.  MUSCULOSKELETAL: There are no major deformities or cyanosis. NEUROLOGIC: No focal weakness or paresthesias are detected. SKIN: He has an extensive wound of the left heel as documented below.        PSYCHIATRIC: He seems to understand clearly and shakes his head yes and no but does not speak.  DATA:    ARTERIAL DUPLEX: I have independently  interpreted the arterial duplex scan that was done today.  On the left side the patient is noted to have triphasic flow in the common femoral, deep femoral, and superficial femoral artery in its proximal and mid segment.  However there is monophasic flow from the distal superficial femoral artery through the popliteal and tibial vessels.  I am unable to find any recent labs.  In 2013 his GFR was 55.  He reportedly has some history of stage III chronic kidney disease.

## 2019-04-10 ENCOUNTER — Inpatient Hospital Stay (HOSPITAL_COMMUNITY)
Admission: RE | Admit: 2019-04-10 | Discharge: 2019-05-05 | DRG: 239 | Disposition: E | Payer: Medicaid Other | Source: Ambulatory Visit | Attending: Pulmonary Disease | Admitting: Pulmonary Disease

## 2019-04-10 ENCOUNTER — Encounter (HOSPITAL_COMMUNITY): Admission: RE | Disposition: E | Payer: Self-pay | Source: Ambulatory Visit | Attending: Internal Medicine

## 2019-04-10 ENCOUNTER — Other Ambulatory Visit: Payer: Self-pay

## 2019-04-10 DIAGNOSIS — Z960 Presence of urogenital implants: Secondary | ICD-10-CM

## 2019-04-10 DIAGNOSIS — Z515 Encounter for palliative care: Secondary | ICD-10-CM

## 2019-04-10 DIAGNOSIS — E8809 Other disorders of plasma-protein metabolism, not elsewhere classified: Secondary | ICD-10-CM | POA: Diagnosis not present

## 2019-04-10 DIAGNOSIS — E11649 Type 2 diabetes mellitus with hypoglycemia without coma: Secondary | ICD-10-CM | POA: Diagnosis not present

## 2019-04-10 DIAGNOSIS — IMO0002 Reserved for concepts with insufficient information to code with codable children: Secondary | ICD-10-CM | POA: Diagnosis present

## 2019-04-10 DIAGNOSIS — Z87440 Personal history of urinary (tract) infections: Secondary | ICD-10-CM

## 2019-04-10 DIAGNOSIS — N179 Acute kidney failure, unspecified: Secondary | ICD-10-CM | POA: Diagnosis not present

## 2019-04-10 DIAGNOSIS — J9690 Respiratory failure, unspecified, unspecified whether with hypoxia or hypercapnia: Secondary | ICD-10-CM

## 2019-04-10 DIAGNOSIS — E876 Hypokalemia: Secondary | ICD-10-CM | POA: Diagnosis not present

## 2019-04-10 DIAGNOSIS — R6521 Severe sepsis with septic shock: Secondary | ICD-10-CM | POA: Diagnosis not present

## 2019-04-10 DIAGNOSIS — N400 Enlarged prostate without lower urinary tract symptoms: Secondary | ICD-10-CM | POA: Diagnosis present

## 2019-04-10 DIAGNOSIS — Z01818 Encounter for other preprocedural examination: Secondary | ICD-10-CM

## 2019-04-10 DIAGNOSIS — Z87898 Personal history of other specified conditions: Secondary | ICD-10-CM

## 2019-04-10 DIAGNOSIS — G40909 Epilepsy, unspecified, not intractable, without status epilepticus: Secondary | ICD-10-CM | POA: Diagnosis present

## 2019-04-10 DIAGNOSIS — I5031 Acute diastolic (congestive) heart failure: Secondary | ICD-10-CM | POA: Diagnosis present

## 2019-04-10 DIAGNOSIS — E1165 Type 2 diabetes mellitus with hyperglycemia: Secondary | ICD-10-CM

## 2019-04-10 DIAGNOSIS — R1110 Vomiting, unspecified: Secondary | ICD-10-CM

## 2019-04-10 DIAGNOSIS — Z79899 Other long term (current) drug therapy: Secondary | ICD-10-CM

## 2019-04-10 DIAGNOSIS — I96 Gangrene, not elsewhere classified: Secondary | ICD-10-CM

## 2019-04-10 DIAGNOSIS — F940 Selective mutism: Secondary | ICD-10-CM | POA: Diagnosis present

## 2019-04-10 DIAGNOSIS — Z9359 Other cystostomy status: Secondary | ICD-10-CM

## 2019-04-10 DIAGNOSIS — I4891 Unspecified atrial fibrillation: Secondary | ICD-10-CM | POA: Diagnosis not present

## 2019-04-10 DIAGNOSIS — I700 Atherosclerosis of aorta: Secondary | ICD-10-CM | POA: Diagnosis present

## 2019-04-10 DIAGNOSIS — U071 COVID-19: Secondary | ICD-10-CM | POA: Diagnosis not present

## 2019-04-10 DIAGNOSIS — L97529 Non-pressure chronic ulcer of other part of left foot with unspecified severity: Secondary | ICD-10-CM | POA: Diagnosis present

## 2019-04-10 DIAGNOSIS — R34 Anuria and oliguria: Secondary | ICD-10-CM | POA: Diagnosis not present

## 2019-04-10 DIAGNOSIS — Z7189 Other specified counseling: Secondary | ICD-10-CM

## 2019-04-10 DIAGNOSIS — D500 Iron deficiency anemia secondary to blood loss (chronic): Secondary | ICD-10-CM | POA: Diagnosis not present

## 2019-04-10 DIAGNOSIS — I4949 Other premature depolarization: Secondary | ICD-10-CM | POA: Diagnosis not present

## 2019-04-10 DIAGNOSIS — N35919 Unspecified urethral stricture, male, unspecified site: Secondary | ICD-10-CM | POA: Diagnosis present

## 2019-04-10 DIAGNOSIS — F259 Schizoaffective disorder, unspecified: Secondary | ICD-10-CM | POA: Diagnosis present

## 2019-04-10 DIAGNOSIS — J80 Acute respiratory distress syndrome: Secondary | ICD-10-CM

## 2019-04-10 DIAGNOSIS — F840 Autistic disorder: Secondary | ICD-10-CM | POA: Diagnosis present

## 2019-04-10 DIAGNOSIS — G9389 Other specified disorders of brain: Secondary | ICD-10-CM | POA: Diagnosis not present

## 2019-04-10 DIAGNOSIS — T8241XA Breakdown (mechanical) of vascular dialysis catheter, initial encounter: Secondary | ICD-10-CM

## 2019-04-10 DIAGNOSIS — N5089 Other specified disorders of the male genital organs: Secondary | ICD-10-CM | POA: Diagnosis not present

## 2019-04-10 DIAGNOSIS — Z66 Do not resuscitate: Secondary | ICD-10-CM | POA: Diagnosis not present

## 2019-04-10 DIAGNOSIS — N184 Chronic kidney disease, stage 4 (severe): Secondary | ICD-10-CM | POA: Diagnosis present

## 2019-04-10 DIAGNOSIS — Z4659 Encounter for fitting and adjustment of other gastrointestinal appliance and device: Secondary | ICD-10-CM

## 2019-04-10 DIAGNOSIS — Z993 Dependence on wheelchair: Secondary | ICD-10-CM

## 2019-04-10 DIAGNOSIS — E1152 Type 2 diabetes mellitus with diabetic peripheral angiopathy with gangrene: Principal | ICD-10-CM | POA: Diagnosis present

## 2019-04-10 DIAGNOSIS — J189 Pneumonia, unspecified organism: Secondary | ICD-10-CM

## 2019-04-10 DIAGNOSIS — K8020 Calculus of gallbladder without cholecystitis without obstruction: Secondary | ICD-10-CM | POA: Diagnosis present

## 2019-04-10 DIAGNOSIS — A4189 Other specified sepsis: Secondary | ICD-10-CM | POA: Diagnosis not present

## 2019-04-10 DIAGNOSIS — E11621 Type 2 diabetes mellitus with foot ulcer: Secondary | ICD-10-CM | POA: Diagnosis present

## 2019-04-10 DIAGNOSIS — J1289 Other viral pneumonia: Secondary | ICD-10-CM | POA: Diagnosis not present

## 2019-04-10 DIAGNOSIS — B3741 Candidal cystitis and urethritis: Secondary | ICD-10-CM | POA: Diagnosis not present

## 2019-04-10 DIAGNOSIS — R5082 Postprocedural fever: Secondary | ICD-10-CM

## 2019-04-10 DIAGNOSIS — A480 Gas gangrene: Secondary | ICD-10-CM | POA: Diagnosis present

## 2019-04-10 DIAGNOSIS — E874 Mixed disorder of acid-base balance: Secondary | ICD-10-CM | POA: Diagnosis present

## 2019-04-10 DIAGNOSIS — F329 Major depressive disorder, single episode, unspecified: Secondary | ICD-10-CM | POA: Diagnosis present

## 2019-04-10 DIAGNOSIS — Z79891 Long term (current) use of opiate analgesic: Secondary | ICD-10-CM

## 2019-04-10 DIAGNOSIS — D638 Anemia in other chronic diseases classified elsewhere: Secondary | ICD-10-CM | POA: Diagnosis present

## 2019-04-10 DIAGNOSIS — L97429 Non-pressure chronic ulcer of left heel and midfoot with unspecified severity: Secondary | ICD-10-CM | POA: Diagnosis present

## 2019-04-10 DIAGNOSIS — D62 Acute posthemorrhagic anemia: Secondary | ICD-10-CM | POA: Diagnosis present

## 2019-04-10 DIAGNOSIS — Z9989 Dependence on other enabling machines and devices: Secondary | ICD-10-CM

## 2019-04-10 DIAGNOSIS — I130 Hypertensive heart and chronic kidney disease with heart failure and stage 1 through stage 4 chronic kidney disease, or unspecified chronic kidney disease: Secondary | ICD-10-CM | POA: Diagnosis present

## 2019-04-10 DIAGNOSIS — R680 Hypothermia, not associated with low environmental temperature: Secondary | ICD-10-CM | POA: Diagnosis not present

## 2019-04-10 DIAGNOSIS — J9600 Acute respiratory failure, unspecified whether with hypoxia or hypercapnia: Secondary | ICD-10-CM

## 2019-04-10 DIAGNOSIS — L89152 Pressure ulcer of sacral region, stage 2: Secondary | ICD-10-CM | POA: Diagnosis not present

## 2019-04-10 DIAGNOSIS — R188 Other ascites: Secondary | ICD-10-CM | POA: Diagnosis present

## 2019-04-10 DIAGNOSIS — F79 Unspecified intellectual disabilities: Secondary | ICD-10-CM

## 2019-04-10 DIAGNOSIS — Z792 Long term (current) use of antibiotics: Secondary | ICD-10-CM

## 2019-04-10 DIAGNOSIS — E538 Deficiency of other specified B group vitamins: Secondary | ICD-10-CM | POA: Diagnosis present

## 2019-04-10 DIAGNOSIS — I471 Supraventricular tachycardia: Secondary | ICD-10-CM | POA: Diagnosis not present

## 2019-04-10 DIAGNOSIS — L8990 Pressure ulcer of unspecified site, unspecified stage: Secondary | ICD-10-CM | POA: Insufficient documentation

## 2019-04-10 DIAGNOSIS — Z794 Long term (current) use of insulin: Secondary | ICD-10-CM

## 2019-04-10 DIAGNOSIS — E1122 Type 2 diabetes mellitus with diabetic chronic kidney disease: Secondary | ICD-10-CM | POA: Diagnosis present

## 2019-04-10 DIAGNOSIS — Z978 Presence of other specified devices: Secondary | ICD-10-CM

## 2019-04-10 DIAGNOSIS — R509 Fever, unspecified: Secondary | ICD-10-CM

## 2019-04-10 DIAGNOSIS — J690 Pneumonitis due to inhalation of food and vomit: Secondary | ICD-10-CM | POA: Diagnosis not present

## 2019-04-10 DIAGNOSIS — J9601 Acute respiratory failure with hypoxia: Secondary | ICD-10-CM | POA: Diagnosis not present

## 2019-04-10 DIAGNOSIS — G92 Toxic encephalopathy: Secondary | ICD-10-CM | POA: Diagnosis not present

## 2019-04-10 DIAGNOSIS — D72829 Elevated white blood cell count, unspecified: Secondary | ICD-10-CM

## 2019-04-10 DIAGNOSIS — Y95 Nosocomial condition: Secondary | ICD-10-CM | POA: Diagnosis not present

## 2019-04-10 LAB — IRON AND TIBC
Iron: 13 ug/dL — ABNORMAL LOW (ref 45–182)
Saturation Ratios: 8 % — ABNORMAL LOW (ref 17.9–39.5)
TIBC: 162 ug/dL — ABNORMAL LOW (ref 250–450)
UIBC: 149 ug/dL

## 2019-04-10 LAB — BASIC METABOLIC PANEL
Anion gap: 13 (ref 5–15)
BUN: 46 mg/dL — ABNORMAL HIGH (ref 6–20)
CO2: 18 mmol/L — ABNORMAL LOW (ref 22–32)
Calcium: 9.2 mg/dL (ref 8.9–10.3)
Chloride: 108 mmol/L (ref 98–111)
Creatinine, Ser: 2.16 mg/dL — ABNORMAL HIGH (ref 0.61–1.24)
GFR calc Af Amer: 38 mL/min — ABNORMAL LOW (ref >60)
GFR calc non Af Amer: 33 mL/min — ABNORMAL LOW (ref >60)
Glucose, Bld: 111 mg/dL — ABNORMAL HIGH (ref 70–99)
Potassium: 4 mmol/L (ref 3.5–5.1)
Sodium: 139 mmol/L (ref 135–145)

## 2019-04-10 LAB — CBC
HCT: 25.4 % — ABNORMAL LOW (ref 39.0–52.0)
Hemoglobin: 7.8 g/dL — ABNORMAL LOW (ref 13.0–17.0)
MCH: 24.7 pg — ABNORMAL LOW (ref 26.0–34.0)
MCHC: 30.7 g/dL (ref 30.0–36.0)
MCV: 80.4 fL (ref 80.0–100.0)
Platelets: 491 10*3/uL — ABNORMAL HIGH (ref 150–400)
RBC: 3.16 MIL/uL — ABNORMAL LOW (ref 4.22–5.81)
RDW: 18.7 % — ABNORMAL HIGH (ref 11.5–15.5)
WBC: 13 10*3/uL — ABNORMAL HIGH (ref 4.0–10.5)
nRBC: 0 % (ref 0.0–0.2)

## 2019-04-10 LAB — RETICULOCYTES
Immature Retic Fract: 28.2 % — ABNORMAL HIGH (ref 2.3–15.9)
RBC.: 2.75 MIL/uL — ABNORMAL LOW (ref 4.22–5.81)
Retic Count, Absolute: 41 10*3/uL (ref 19.0–186.0)
Retic Ct Pct: 1.5 % (ref 0.4–3.1)

## 2019-04-10 LAB — C-REACTIVE PROTEIN: CRP: 22.4 mg/dL — ABNORMAL HIGH (ref <1.0)

## 2019-04-10 LAB — SEDIMENTATION RATE: Sed Rate: 116 mm/hr — ABNORMAL HIGH (ref 0–16)

## 2019-04-10 LAB — VITAMIN B12: Vitamin B-12: 1214 pg/mL — ABNORMAL HIGH (ref 180–914)

## 2019-04-10 LAB — SARS CORONAVIRUS 2 BY RT PCR (HOSPITAL ORDER, PERFORMED IN ~~LOC~~ HOSPITAL LAB): SARS Coronavirus 2: NEGATIVE

## 2019-04-10 LAB — GLUCOSE, CAPILLARY
Glucose-Capillary: 87 mg/dL (ref 70–99)
Glucose-Capillary: 108 mg/dL — ABNORMAL HIGH (ref 70–99)
Glucose-Capillary: 170 mg/dL — ABNORMAL HIGH (ref 70–99)

## 2019-04-10 LAB — FOLATE: Folate: 3.8 ng/mL — ABNORMAL LOW (ref >5.9)

## 2019-04-10 LAB — ABO/RH: ABO/RH(D): A POS

## 2019-04-10 LAB — FERRITIN: Ferritin: 1671 ng/mL — ABNORMAL HIGH (ref 24–336)

## 2019-04-10 LAB — PREPARE RBC (CROSSMATCH)

## 2019-04-10 LAB — HEMOGLOBIN AND HEMATOCRIT, BLOOD
HCT: 21.9 % — ABNORMAL LOW (ref 39.0–52.0)
Hemoglobin: 6.9 g/dL — CL (ref 13.0–17.0)

## 2019-04-10 SURGERY — ABDOMINAL AORTOGRAM W/LOWER EXTREMITY
Anesthesia: LOCAL | Laterality: Bilateral

## 2019-04-10 MED ORDER — PIPERACILLIN-TAZOBACTAM 3.375 G IVPB 30 MIN
3.3750 g | Freq: Once | INTRAVENOUS | Status: AC
  Administered 2019-04-10: 3.375 g via INTRAVENOUS
  Filled 2019-04-10: qty 50

## 2019-04-10 MED ORDER — VANCOMYCIN HCL IN DEXTROSE 1-5 GM/200ML-% IV SOLN
1000.0000 mg | INTRAVENOUS | Status: DC
  Administered 2019-04-11 – 2019-04-15 (×4): 1000 mg via INTRAVENOUS
  Filled 2019-04-10 (×7): qty 200

## 2019-04-10 MED ORDER — ACETAMINOPHEN 325 MG PO TABS
650.0000 mg | ORAL_TABLET | Freq: Four times a day (QID) | ORAL | Status: DC | PRN
  Administered 2019-04-11 – 2019-04-26 (×9): 650 mg via ORAL
  Filled 2019-04-10 (×10): qty 2

## 2019-04-10 MED ORDER — ALBUTEROL SULFATE (2.5 MG/3ML) 0.083% IN NEBU
2.5000 mg | INHALATION_SOLUTION | Freq: Four times a day (QID) | RESPIRATORY_TRACT | Status: DC | PRN
  Administered 2019-04-23: 2.5 mg via RESPIRATORY_TRACT
  Filled 2019-04-10: qty 3

## 2019-04-10 MED ORDER — SODIUM CHLORIDE 0.9% IV SOLUTION: Freq: Once | INTRAVENOUS | Status: DC

## 2019-04-10 MED ORDER — PANTOPRAZOLE SODIUM 40 MG IV SOLR: 40.0000 mg | Freq: Two times a day (BID) | INTRAVENOUS | Status: DC

## 2019-04-10 MED ORDER — VANCOMYCIN HCL IN DEXTROSE 1-5 GM/200ML-% IV SOLN
1000.0000 mg | Freq: Once | INTRAVENOUS | Status: AC
  Administered 2019-04-10: 1000 mg via INTRAVENOUS
  Filled 2019-04-10: qty 200

## 2019-04-10 MED ORDER — INSULIN DETEMIR 100 UNIT/ML ~~LOC~~ SOLN
10.0000 [IU] | Freq: Every day | SUBCUTANEOUS | Status: DC
  Administered 2019-04-11 – 2019-04-16 (×6): 10 [IU] via SUBCUTANEOUS
  Filled 2019-04-10 (×6): qty 0.1

## 2019-04-10 MED ORDER — PIPERACILLIN-TAZOBACTAM 3.375 G IVPB
3.3750 g | Freq: Three times a day (TID) | INTRAVENOUS | Status: DC
  Administered 2019-04-11 – 2019-04-17 (×19): 3.375 g via INTRAVENOUS
  Filled 2019-04-10 (×20): qty 50

## 2019-04-10 MED ORDER — MIRTAZAPINE 15 MG PO TABS
15.0000 mg | ORAL_TABLET | Freq: Every day | ORAL | Status: DC
  Administered 2019-04-10 – 2019-04-25 (×15): 15 mg via ORAL
  Filled 2019-04-10 (×10): qty 2
  Filled 2019-04-10: qty 1
  Filled 2019-04-10 (×4): qty 2

## 2019-04-10 MED ORDER — SODIUM CHLORIDE 0.9 % IV SOLN: 8.0000 mg/h | INTRAVENOUS | Status: DC

## 2019-04-10 MED ORDER — HYDRALAZINE HCL 25 MG PO TABS
25.0000 mg | ORAL_TABLET | Freq: Three times a day (TID) | ORAL | Status: DC
  Administered 2019-04-10 – 2019-04-22 (×36): 25 mg via ORAL
  Filled 2019-04-10 (×38): qty 1

## 2019-04-10 MED ORDER — LEVETIRACETAM 250 MG PO TABS
250.0000 mg | ORAL_TABLET | Freq: Two times a day (BID) | ORAL | Status: DC
  Administered 2019-04-10 – 2019-04-24 (×28): 250 mg via ORAL
  Filled 2019-04-10 (×29): qty 1

## 2019-04-10 MED ORDER — ONDANSETRON HCL 4 MG/2ML IJ SOLN
4.0000 mg | Freq: Four times a day (QID) | INTRAMUSCULAR | Status: DC | PRN
  Administered 2019-04-10 – 2019-04-22 (×5): 4 mg via INTRAVENOUS
  Filled 2019-04-10 (×5): qty 2

## 2019-04-10 MED ORDER — ONDANSETRON HCL 4 MG PO TABS
4.0000 mg | ORAL_TABLET | Freq: Four times a day (QID) | ORAL | Status: DC | PRN
  Administered 2019-04-21: 4 mg via ORAL
  Filled 2019-04-10: qty 1

## 2019-04-10 MED ORDER — SODIUM CHLORIDE 0.9% FLUSH
3.0000 mL | Freq: Two times a day (BID) | INTRAVENOUS | Status: DC
  Administered 2019-04-10 – 2019-04-27 (×17): 3 mL via INTRAVENOUS

## 2019-04-10 MED ORDER — SODIUM CHLORIDE 0.9 % IV SOLN
INTRAVENOUS | Status: DC
  Administered 2019-04-10: 09:00:00 via INTRAVENOUS

## 2019-04-10 MED ORDER — AMLODIPINE BESYLATE 10 MG PO TABS
10.0000 mg | ORAL_TABLET | Freq: Every day | ORAL | Status: DC
  Administered 2019-04-11 – 2019-04-22 (×12): 10 mg via ORAL
  Filled 2019-04-10 (×12): qty 1

## 2019-04-10 MED ORDER — SERTRALINE HCL 100 MG PO TABS
200.0000 mg | ORAL_TABLET | Freq: Every day | ORAL | Status: DC
  Administered 2019-04-10 – 2019-04-26 (×16): 200 mg via ORAL
  Filled 2019-04-10 (×16): qty 2

## 2019-04-10 MED ORDER — SODIUM CHLORIDE 0.9 % IV SOLN: 80.0000 mg | Freq: Once | INTRAVENOUS | Status: DC

## 2019-04-10 MED ORDER — PANTOPRAZOLE SODIUM 40 MG PO TBEC
40.0000 mg | DELAYED_RELEASE_TABLET | Freq: Every day | ORAL | Status: DC
  Administered 2019-04-11 – 2019-04-24 (×14): 40 mg via ORAL
  Filled 2019-04-10 (×15): qty 1

## 2019-04-10 MED ORDER — INSULIN DETEMIR 100 UNIT/ML FLEXPEN: 10.0000 [IU] | Freq: Every day | SUBCUTANEOUS | Status: DC

## 2019-04-10 MED ORDER — ZIPRASIDONE HCL 20 MG PO CAPS
20.0000 mg | ORAL_CAPSULE | Freq: Every day | ORAL | Status: DC
  Administered 2019-04-11 – 2019-04-24 (×14): 20 mg via ORAL
  Filled 2019-04-10 (×14): qty 1

## 2019-04-10 MED ORDER — ZIPRASIDONE HCL 40 MG PO CAPS
40.0000 mg | ORAL_CAPSULE | Freq: Every day | ORAL | Status: DC
  Administered 2019-04-10 – 2019-04-23 (×14): 40 mg via ORAL
  Filled 2019-04-10 (×15): qty 1

## 2019-04-10 MED ORDER — SODIUM CHLORIDE 0.9 % IV SOLN
510.0000 mg | Freq: Once | INTRAVENOUS | Status: AC
  Administered 2019-04-11: 09:00:00 510 mg via INTRAVENOUS
  Filled 2019-04-10: qty 17

## 2019-04-10 MED ORDER — ACETAMINOPHEN 650 MG RE SUPP
650.0000 mg | Freq: Four times a day (QID) | RECTAL | Status: DC | PRN
  Administered 2019-04-24 (×2): 650 mg via RECTAL
  Filled 2019-04-10 (×2): qty 1

## 2019-04-10 NOTE — Consult Note (Addendum)
[facility] Consult: 4:21 PM 04/19/2019  LOS: 0 days    Referring Provider: Dr Harvest Forest  Primary Care Physician:  Rosita Fire, MD in [place] Primary Gastroenterologist:  Dr Carlyon Prows fields    Reason for Consultation:  Anemia.     HPI: [name] is a 58 y.o. male.  PMH severe autism, mental retardation.  Schizoaffective disorder.   CKD stage 2.  Recurrent UTI.  S/p percutaneous IDDM.  On CT scans of abdomen pelvis several years ago had esophageal thickening, likely reflecting chronic inflammation.  Esophagram in 02/2012 showed dysfunctional swallowing.  Aspiration pneumonia leading to PEG placement 02/2012.  PEG is no longer in place.  Renal cysts.   Chronic anemia dating to at least 2011.   He is now in the hospital with a gangrenous wound of his left heel.  Not a candidate for revascularization and he is markedly debilitated, nonambulatory.  There was some consideration for endovascular revascularization and he was supposed to have CT angiogram today but this was scrapped because of anemia.  Per Dr. Mee Hives note this afternoon, he feels the patient is best served by left AKA.  Labs reveal a Hgb that has gone from 7.8 >> 6.9 in the last 24 hours.  Recent baseline is unknown but he tended to run from 8-9 several years ago.  Hgb 6.5 in mid June at the time of foot ulcer wound debridement,  FOBT negative.  Renal, Dr. Lowanda Foster began iron infusions starting July 7, second infusion on 7/30.  During admission of 03/13/2023 to [facility] rocking him for recurrent UTI (his ninth) he was again transfused with 2 PRBCs for Hgb 6.3.  Stool FOBT negative at the time. Renal function is worse. INR 1.2  He lives at the [facility] long-term care center.  He is nonambulatory.  He does get himself to the bathroom and wipe up after  himself but he has not seen blood.  There is some skin breakdown in the scrotum and perirectal area.  No nausea, vomiting, no abdominal pain.  Takes Pepcid.  Not on any anticoagulants or NSAIDs.  Not on oral iron and there is no mention of treatment with parenteral iron.  Past Medical History:  Diagnosis Date  . Autism   . BPH (benign prostatic hyperplasia)   . Depression   . Diabetes mellitus   . Elective mutism   . Gastroparesis   . Hyperglycemia   . Hyperosmolar (nonketotic) coma ([place])   . Hyponatremia   . Mental retardation   . Mental retardation   . Pneumonia   . Renal disorder   . Urinary retention   . UTI (urinary tract infection)     No past surgical history on file.  Prior to Admission medications   Medication Sig Start Date End Date Taking? Authorizing Provider  acetaminophen (TYLENOL) 325 MG tablet Take 650 mg by mouth every 4 (four) hours as needed for fever (pain).    Yes [provider]  amLODipine (NORVASC) 10 MG tablet Take 10 mg by mouth daily.   Yes [provider]  ertapenem (INVANZ) 1 g injection Inject 1 g into the muscle daily.   Yes [provider]  famotidine (PEPCID) 10 MG tablet Take 20 mg by mouth daily.    Yes [provider]  hydrALAZINE (APRESOLINE) 25 MG tablet Take 25 mg by mouth 3 (three) times daily.   Yes [provider]  HYDROcodone-acetaminophen (NORCO/VICODIN) 5-325 MG tablet Take 1 tablet by mouth every 6 (six) hours as needed (pain).    Yes [provider]  insulin detemir (LEVEMIR) 100 unit/ml SOLN Inject 10 Units into the skin daily.   Yes [provider]  insulin NPH-regular Human (70-30) 100 UNIT/ML injection Inject 15 Units into the skin daily with breakfast. Hold if blood sugar < 150   Yes [provider]  insulin regular (NOVOLIN R) 100 units/mL injection Inject 10 Units into the skin See admin instructions. Give 10 units as needed for blood sugars > 400   Yes  [provider]  levETIRAcetam (KEPPRA) 250 MG tablet Take 250 mg by mouth 2 (two) times daily.   Yes [provider]  mirtazapine (REMERON) 15 MG tablet Take 15 mg by mouth at bedtime.    Yes [provider]  Multiple Vitamins-Minerals (MULTIVITAMIN WITH MINERALS) tablet Take 1 tablet by mouth daily.   Yes [provider]  Nutritional Supplements (PROMOD) LIQD Take 30 mLs by mouth 2 (two) times daily.   Yes [provider]  pioglitazone (ACTOS) 45 MG tablet Take 45 mg by mouth daily.   Yes [provider]  sertraline (ZOLOFT) 100 MG tablet Take 200 mg by mouth daily.    Yes [provider]  vitamin C (ASCORBIC ACID) 500 MG tablet Take 500 mg by mouth 2 (two) times daily.   Yes [provider]  ziprasidone (GEODON) 20 MG capsule Take 20 mg by mouth daily.    Yes [provider]  ziprasidone (GEODON) 40 MG capsule Take 40 mg by mouth at bedtime.   Yes [provider]    Scheduled Meds: . sodium chloride   Intravenous Once  . [START ON 04/11/2019] amLODipine  10 mg Oral Daily  . hydrALAZINE  25 mg Oral TID  . levETIRAcetam  250 mg Oral BID  . pantoprazole (PROTONIX) IV  40 mg Intravenous Q12H  . sodium chloride flush  3 mL Intravenous Q12H   Infusions: . sodium chloride 100 mL/hr at 04/15/2019 0929  . piperacillin-tazobactam    . vancomycin     PRN Meds: acetaminophen **OR** acetaminophen, albuterol, ondansetron **OR** ondansetron (ZOFRAN) IV   Allergies as of 03/26/2019  . (No Known Allergies)    No family history on file.  Social History   Socioeconomic History  . Marital status: Single    Spouse name: Not on file  . Number of children: Not on file  . Years of education: Not on file  . Highest education level: Not on file  Occupational History  . Not on file  Social Needs  . Financial resource strain: Not on file  . Food insecurity    Worry: Not on file    Inability: Not on file  .  Transportation needs    Medical: Not on file    Non-medical: Not on file  Tobacco Use  . Smoking status: Never Smoker  . Smokeless tobacco: Never Used  Substance and Sexual Activity  . Alcohol use: No  . Drug use: No  . Sexual activity: Not on file  Lifestyle  . Physical activity  Days per week: Not on file    Minutes per session: Not on file  . Stress: Not on file  Relationships  . Social Herbalist on phone: Not on file    Gets together: Not on file    Attends religious service: Not on file    Active member of club or organization: Not on file    Attends meetings of clubs or organizations: Not on file    Relationship status: Not on file  . Intimate partner violence    Fear of current or ex partner: Not on file    Emotionally abused: Not on file    Physically abused: Not on file    Forced sexual activity: Not on file  Other Topics Concern  . Not on file  Social History Narrative  . Not on file    REVIEW OF SYSTEMS: Constitutional: No notes mentioning fatigue or weakness. ENT:  No nose bleeds Pulm: No shortness of breath, no cough. CV:  No palpitations, no LE edema.  No mention of chest pain. GU:  No hematuria, no frequency GI: Not clear what his baseline diet is but he no longer has the G-tube so he is eating some sort of a diet. Heme: No unusual bleeding ported. Transfusions: Per HPI.  More transfusions ordered today. Neuro:  No headaches, no peripheral tingling or numbness Derm:  No itching, no rash or sores.  Endocrine:  No sweats or chills.  No polyuria or dysuria Immunization: Unknown, the only vaccination in epic is pneumococcal 23 given in 2013. Travel:  None beyond local counties in last few months.    PHYSICAL EXAM: Vital signs in last 24 hours: Vitals:   04/30/2019 0809 04/27/2019 1420  BP: (!) 125/107 116/62  Pulse: 88 77  Resp:  19  Temp: (!) 97.3 F (36.3 C) 97.6 F (36.4 C)  SpO2: 100% 100%   Wt Readings from Last 3 Encounters:   04/18/2019 77 kg  02/20/12 67.7 kg  01/11/12 68 kg    General: Cooperative, mute, nontoxic appearing AA M. Head: No facial asymmetry or swelling.  No signs of head trauma. Eyes: Conjunctiva somewhat pale.  No scleral icterus. Ears: Not hard of hearing Nose: No discharge or congestion Mouth: Oral mucosa moist, pink, clear.  Tongue midline. Neck: No mass, no thyromegaly, no JVD. Lungs: No labored breathing or cough.  Lungs clear bilaterally though breath sounds somewhat reduced in the bases. Heart: RRR. Abdomen: Soft.  Not tender.  Well-healed G-tube site scar in the left mid upper abdomen.  No HSM, masses, bruits, hernias..   Rectal: Skin breakdown in the perirectal region as well as in the posterior scrotal region.  No rectal masses.  Stool is light to medium brown, FOBT negative. Musc/Skeltl: No joint redness, swelling.  Some atrophy in the limbs generally. Extremities: No CCE.  Booties to prevent ulcers wrapping both feet/ankles.  Booties were not removed for exam.  There is a smell of decay coming from his feet. Neurologic: Alert.  Nods appropriately but mute.  Follows commands.  Able to turn onto his side without assistance.  Difficulty moving his leg to the side.  No tremor.  Strength not tested. Skin: Skin breakdown in the scrotum and perirectal area as above. Tattoos: None observed   Psych: Calm, cooperative.  Intake/Output from previous day: No intake/output data recorded. Intake/Output this shift: No intake/output data recorded.  LAB RESULTS: Recent Labs    04/17/2019 0934 05/04/2019 1453  WBC 13.0*  --  HGB 7.8* 6.9*  HCT 25.4* 21.9*  PLT 491*  --    BMET Lab Results  Component Value Date   NA 139 04/29/2019   NA 136 02/23/2012   NA 139 02/22/2012   K 4.0 04/23/2019   K 4.2 02/23/2012   K 3.7 02/22/2012   CL 108 04/27/2019   CL 102 02/23/2012   CL 105 02/22/2012   CO2 18 (L) 04/07/2019   CO2 24 02/23/2012   CO2 23 02/22/2012   GLUCOSE 111 (H) 04/19/2019    GLUCOSE 144 (H) 02/23/2012   GLUCOSE 396 (H) 02/23/2012   BUN 46 (H) 04/22/2019   BUN 29 (H) 02/23/2012   BUN 23 02/22/2012   CREATININE 2.16 (H) 04/14/2019   CREATININE 1.63 (H) 02/23/2012   CREATININE 1.64 (H) 02/22/2012   CALCIUM 9.2 04/08/2019   CALCIUM 7.8 (L) 02/23/2012   CALCIUM 7.5 (L) 02/22/2012   LFT No results for input(s): PROT, ALBUMIN, AST, ALT, ALKPHOS, BILITOT, BILIDIR, IBILI in the last 72 hours. PT/INR Lab Results  Component Value Date   INR 1.24 02/16/2012   INR 1.06 01/15/2011   Lipase     Component Value Date/Time   LIPASE 13 10/22/2011 0419    Drugs of Abuse  No results found for: LABOPIA, COCAINSCRNUR, LABBENZ, AMPHETMU, THCU, LABBARB   RADIOLOGY STUDIES: No results found.    IMPRESSION:   *     Normocytic anemia. Suspect this is anemia of chronic disease.  It is certainly longstanding.  However he has had more acute, transfusion requiring, decline of Hgb in July and again now. FOBT testing is negative for me on DRE today, FOBT negative a month ago.  Received Feraheme injection twice last month.  *    Gangrenous foot ulcer.  On chronic antibiotic.  Currently on vancomycin and Zosyn.  Not a candidate for revascularization,  AKA recommended.   *     Hx recurrent UTI.  Suprapubic catheter in place for hx urinary stricture.    *   Severe autism with mutism.  Patient cooperative for the exam today, communicates with nods.     *   CKD, currently stage 3b.    PLAN:     *    Attain anemia panel to try to discern if he is iron, B12 or folate deficient.  *     Dr. Bryan Lemma will see the patient tomorrow  *     Allow diet, currently he is on clears and he can have more than that but I do not know what his baseline diet is.  Dr. Tamala Julian will look into this and order a more appropriate advanced diet.  *    Switch the twice daily IV Protonix to Protonix 40 mg once a day.  *   Blood transfusion ordered per Dr. Tamala Julian.  Azucena Freed  04/11/2019, 4:21  PM Phone [phone]

## 2019-04-10 NOTE — Progress Notes (Signed)
VASCULAR SURGERY:  I had seen this patient in the office on 03/26/2019 with a gangrenous wound on his left heel.  He is markedly debilitated and is not a candidate for revascularization.  He presented for arteriography to see if he might have an endovascular approach.  His hemoglobin today was 7.8 and he had just been transfused 2 units in July.  The wound on the heel has progressed significantly and at this point I do not think this is salvageable.  I have discussed this with [facility] hospitalist and they will admit the patient for management of his multiple medical issues.  Once he is medically stable I think he would best be served by a primary left above-the-knee amputation.  I have discussed this with his caregiver.  The patient himself is autistic.  I will be out of town next week.  My partners will follow.  [name], MD, [place] [phone] Office: [phone]

## 2019-04-10 NOTE — H&P (Signed)
History and Physical    [name] UXN:[id number] DOB: 06/30/1961 DOA: 04/08/2019  Referring MD/NP/PA: [name], MD PCP: Rosita Fire, MD  Patient coming from: Direct Admission from short stay  Chief Complaint: Left heel wound  I have personally briefly reviewed patient's old medical records in [place]   HPI: [name] is a 58 y.o. male with medical history significant of severe autism with elective mutism, schizoaffective disorder, diabetes mellitus type 2, chronic kidney disease, chronic suprapubic catheter for urinary strictures, who presented for CT angiogram of the left leg with Dr. Bobette Mo for nonhealing ulcer.  However, patient was found to have hemoglobin 7.8 for which the procedure was canceled.  Social worker is present at bedside and provides history as the patient is unable to provide his own.  At baseline patient is wheelchair-bound.  The wound first appeared a couple months ago.  They were not able to get into the wound care center until June 23.  He underwent surgical debridement on June 25.  During that time he was noted to have dropping hemoglobin for which he was referred to Dr. Lowanda Foster of nephrology.  Hemoglobin noted to drop to 6.8, and Dr. Lowanda Foster started him on iron infusions.  His first infusion given on July 7.  Subsequently, patient was admitted to [facility] on the 9th for a urinary tract infection, and on July 10 was transfused 2 units of blood as his hemoglobin was down to 6.3.  Stool guaiacs were reported to be negative at that time.  Patient received the second iron fusion on the 30th, but had not been formally seen by Dr. Lowanda Foster.   ED Course: As seen above.  Review of Systems  Unable to perform ROS: Patient nonverbal    Past Medical History:  Diagnosis Date  . Autism   . BPH (benign prostatic hyperplasia)   . Depression   . Diabetes mellitus   . Elective mutism   . Gastroparesis   . Hyperglycemia   .  Hyperosmolar (nonketotic) coma ([place])   . Hyponatremia   . Mental retardation   . Mental retardation   . Pneumonia   . Renal disorder   . Urinary retention   . UTI (urinary tract infection)     No past surgical history on file.   reports that he has never smoked. He has never used smokeless tobacco. He reports that he does not drink alcohol or use drugs.  No Known Allergies  No family history on file.  Prior to Admission medications   Medication Sig Start Date End Date Taking? Authorizing Provider  acetaminophen (TYLENOL) 325 MG tablet Take 650 mg by mouth every 4 (four) hours as needed for fever (pain).    Yes [provider]  amLODipine (NORVASC) 10 MG tablet Take 10 mg by mouth daily.   Yes [provider]  ertapenem (INVANZ) 1 g injection Inject 1 g into the muscle daily.   Yes [provider]  famotidine (PEPCID) 10 MG tablet Take 20 mg by mouth daily.    Yes [provider]  hydrALAZINE (APRESOLINE) 25 MG tablet Take 25 mg by mouth 3 (three) times daily.   Yes [provider]  HYDROcodone-acetaminophen (NORCO/VICODIN) 5-325 MG tablet Take 1 tablet by mouth every 6 (six) hours as needed (pain).    Yes [provider]  insulin detemir (LEVEMIR) 100 unit/ml SOLN Inject 10 Units into the skin daily.   Yes [provider]  insulin NPH-regular Human (70-30) 100  UNIT/ML injection Inject 15 Units into the skin daily with breakfast. Hold if blood sugar < 150   Yes [provider]  insulin regular (NOVOLIN R) 100 units/mL injection Inject 10 Units into the skin See admin instructions. Give 10 units as needed for blood sugars > 400   Yes [provider]  levETIRAcetam (KEPPRA) 250 MG tablet Take 250 mg by mouth 2 (two) times daily.   Yes [provider]  mirtazapine (REMERON) 15 MG tablet Take 15 mg by mouth at bedtime.    Yes [provider]  Multiple Vitamins-Minerals (MULTIVITAMIN WITH  MINERALS) tablet Take 1 tablet by mouth daily.   Yes [provider]  Nutritional Supplements (PROMOD) LIQD Take 30 mLs by mouth 2 (two) times daily.   Yes [provider]  pioglitazone (ACTOS) 45 MG tablet Take 45 mg by mouth daily.   Yes [provider]  sertraline (ZOLOFT) 100 MG tablet Take 200 mg by mouth daily.    Yes [provider]  vitamin C (ASCORBIC ACID) 500 MG tablet Take 500 mg by mouth 2 (two) times daily.   Yes [provider]  ziprasidone (GEODON) 20 MG capsule Take 20 mg by mouth daily.    Yes [provider]  ziprasidone (GEODON) 40 MG capsule Take 40 mg by mouth at bedtime.   Yes [provider]    Physical Exam:  Constitutional: Disabled male who appears to be in no acute distress Vitals:   04/09/2019 0809  BP: (!) 125/107  Pulse: 88  Temp: (!) 97.3 F (36.3 C)  TempSrc: Temporal  SpO2: 100%  Weight: 77 kg  Height: '5\' 10"'$  (1.778 m)   Eyes: PERRL, lids and conjunctivae normal ENMT: Mucous membranes are dry. Posterior pharynx clear of any exudate or lesions.  Neck: normal, supple, no masses, no thyromegaly Respiratory: clear to auscultation bilaterally, no wheezing, no crackles. Normal respiratory effort. No accessory muscle use.  Cardiovascular: Regular rate and rhythm, no murmurs / rubs / gallops. No extremity edema. 1+ pedal pulses noted in the left leg. No carotid bruits.  Abdomen: no tenderness, no masses palpated. No hepatosplenomegaly. Bowel sounds positive.  Musculoskeletal: no clubbing / cyanosis. No joint deformity upper and lower extremities. Good ROM, no contractures. Normal muscle tone.  Skin: Necrotic black appearance of the left heel Neurologic: CN 2-12 grossly intact. Sensation intact, DTR normal. Strength 5/5 in all 4.  Psychiatric: Patient alert and able to answer yes/no questions.   Labs on Admission: I have personally reviewed following labs and imaging studies  CBC: Recent Labs   Lab 04/04/2019 0934  WBC 13.0*  HGB 7.8*  HCT 25.4*  MCV 80.4  PLT 250*   Basic Metabolic Panel: Recent Labs  Lab 04/26/2019 0934  NA 139  K 4.0  CL 108  CO2 18*  GLUCOSE 111*  BUN 46*  CREATININE 2.16*  CALCIUM 9.2   GFR: Estimated Creatinine Clearance: 39 mL/min (A) (by C-G formula based on SCr of 2.16 mg/dL (H)). Liver Function Tests: No results for input(s): AST, ALT, ALKPHOS, BILITOT, PROT, ALBUMIN in the last 168 hours. No results for input(s): LIPASE, AMYLASE in the last 168 hours. No results for input(s): AMMONIA in the last 168 hours. Coagulation Profile: No results for input(s): INR, PROTIME in the last 168 hours. Cardiac Enzymes: No results for input(s): CKTOTAL, CKMB, CKMBINDEX, TROPONINI in the last 168 hours. BNP (last 3 results) No results for input(s): PROBNP in the last 8760 hours. HbA1C: No results for  input(s): HGBA1C in the last 72 hours. CBG: Recent Labs  Lab 04/05/2019 0818  GLUCAP 87   Lipid Profile: No results for input(s): CHOL, HDL, LDLCALC, TRIG, CHOLHDL, LDLDIRECT in the last 72 hours. Thyroid Function Tests: No results for input(s): TSH, T4TOTAL, FREET4, T3FREE, THYROIDAB in the last 72 hours. Anemia Panel: No results for input(s): VITAMINB12, FOLATE, FERRITIN, TIBC, IRON, RETICCTPCT in the last 72 hours. Urine analysis:    Component Value Date/Time   COLORURINE AMBER (A) 01/21/2012 1250   APPEARANCEUR CLEAR 01/21/2012 1250   LABSPEC >1.030 (H) 01/21/2012 1250   PHURINE 6.0 01/21/2012 1250   GLUCOSEU NEGATIVE 01/21/2012 1250   HGBUR NEGATIVE 01/21/2012 1250   BILIRUBINUR NEGATIVE 01/21/2012 1250   KETONESUR NEGATIVE 01/21/2012 1250   PROTEINUR TRACE (A) 01/21/2012 1250   UROBILINOGEN 0.2 01/21/2012 1250   NITRITE NEGATIVE 01/21/2012 1250   LEUKOCYTESUR SMALL (A) 01/21/2012 1250   Sepsis Labs: Recent Results (from the past 240 hour(s))  SARS Coronavirus 2 [facility] order, Performed in [facility] hospital lab) Nasopharyngeal  Nasopharyngeal Swab     Status: None   Collection Time: 04/13/2019  8:30 AM   Specimen: Nasopharyngeal Swab  Result Value Ref Range Status   SARS Coronavirus 2 NEGATIVE NEGATIVE Final    Comment: (NOTE) If result is NEGATIVE SARS-CoV-2 target nucleic acids are NOT DETECTED. The SARS-CoV-2 RNA is generally detectable in upper and lower  respiratory specimens during the acute phase of infection. The lowest  concentration of SARS-CoV-2 viral copies this assay can detect is 250  copies / mL. A negative result does not preclude SARS-CoV-2 infection  and should not be used as the sole basis for treatment or other  patient management decisions.  A negative result may occur with  improper specimen collection / handling, submission of specimen other  than nasopharyngeal swab, presence of viral mutation(s) within the  areas targeted by this assay, and inadequate number of viral copies  (<250 copies / mL). A negative result must be combined with clinical  observations, patient history, and epidemiological information. If result is POSITIVE SARS-CoV-2 target nucleic acids are DETECTED. The SARS-CoV-2 RNA is generally detectable in upper and lower  respiratory specimens dur ing the acute phase of infection.  Positive  results are indicative of active infection with SARS-CoV-2.  Clinical  correlation with patient history and other diagnostic information is  necessary to determine patient infection status.  Positive results do  not rule out bacterial infection or co-infection with other viruses. If result is PRESUMPTIVE POSTIVE SARS-CoV-2 nucleic acids MAY BE PRESENT.   A presumptive positive result was obtained on the submitted specimen  and confirmed on repeat testing.  While 2019 novel coronavirus  (SARS-CoV-2) nucleic acids may be present in the submitted sample  additional confirmatory testing may be necessary for epidemiological  and / or clinical management purposes  to differentiate between   SARS-CoV-2 and other Sarbecovirus currently known to infect humans.  If clinically indicated additional testing with an alternate test  methodology [phone]) is advised. The SARS-CoV-2 RNA is generally  detectable in upper and lower respiratory sp ecimens during the acute  phase of infection. The expected result is Negative. Fact Sheet for Patients:  StrictlyIdeas.no Fact Sheet for Healthcare Providers: BankingDealers.co.za This test is not yet approved or cleared by the [place] FDA and has been authorized for detection and/or diagnosis of SARS-CoV-2 by FDA under an Emergency Use Authorization (EUA).  This EUA will remain in effect (meaning this test can be used)  for the duration of the COVID-19 declaration under Section 564(b)(1) of the Act, 21 U.S.C. section 360bbb-3(b)(1), unless the authorization is terminated or revoked sooner. Performed at [facility], [place] [address] [place], [place] [address]      Radiological Exams on Admission: No results found.  EKG: Independently reviewed.  Sinus rhythm at 79 bpm  Assessment/Plan Questioning acute blood loss: Acute on chronic.  Hemoglobin 7.8 with low MCH.  Given the low MCH and elevated RDW suspect iron deficiency.  He has been started on iron transfusions with nephrology in the outpatient setting.  Question possibility of acute blood loss, but previously reported to have negative stool guaiacs when evaluated.  Discussed case with GI who felt patient not to be actively bleeding at this time. -Admit medical telemetry bed -Check stool guaiac -Type and screen for possible need of bloo products -Serial monitoring of hemoglobind -Transfuse 2 units of PRBCs as repeat hemoglobin 6.9. -Protonix IV twice daily -Feraheme injection in a.m. -Consider discussing case with Dr. Lowanda Foster  -Reconsult 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  Patient admitted in  the process of undergoing arteriogram today with Dr. Scot Dock vascular surgery, but had to be held due to low blood counts.  WBC elevated at 13.  Patient has been afebrile.  Question possibility of underlying infection.  It appears advanced started on ertapenem IV in the outpatient setting.  Vascular surgery suggest likely need of above knee amputation. -Check blood culture -Check ESR and CRP -Empiric antibiotics of vancomycin and Zosyn -Wound care consult -Continue follow-up with vascular surgery once medically stable  Leukocytosis: Acute.  WBC elevated up to 13 on admission..  No reports of fever prior to admission. -Recheck CBC in a.m.  Acute kidney injury superimposed on chronic kidney disease, chronic suprapubic catheter: Unclear what patient's recent baseline is, but creatinine elevated up to 2.16 with BUN 46. -Check urinalysis -Check urine creatinine and urine sodium -Obtain records from [facility] -Normal saline IV fluids 100 mL/h  Essential hypertension -Continue amlodipine and hydralazine   Thrombocytosis: Acute.  Platelet count 491.  Suspect reactive in nature. -Continue to monitor  Diabetes mellitus type 2: Reported to be a brittle diabetic.  Last hemoglobin A1c available from 2003 was 8. -Hypoglycemic protocol -Check hemoglobin A1c in a.m. -Hold Actos -Continue Levemir 10 units q. A.m. -Sensitive SSI with meals  Intellectual disability, autism: At baseline patient is autistic and electively mute.  Able to answer yes/no questions and can write.  Schizoaffective disorder -Continue Geodon, Zoloft and Remeron  History of seizure disorder -Continue Keppra  DVT prophylaxis: scds Code Status: Full  Family Communication: Discussed plan of care with patient and caregiver present at bedside Disposition Plan: To be determined Consults called: Vascular surgery Admission status: observation  Norval Morton MD [facility] Pager [phone]   If 7PM-7AM,  please contact night-coverage [email or web address] Password [email or web address]  04/25/2019, 12:10 PM

## 2019-04-10 NOTE — Progress Notes (Signed)
[name] / cath lab called, [name] is handling updates in jennifer's absence , HGB 7.8, no further orders.

## 2019-04-10 NOTE — Progress Notes (Signed)
Pharmacy Antibiotic Note  [name] is a 58 y.o. male admitted on 04/19/2019 with gangrenous wound infection.  Pharmacy has been consulted for Vanco/Zosyn dosing.  CC/HPI: From MD office with gangrenous wound of left heel. No a revascularization candidate. Hgb low. MD aware. 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 Gastroparesis, low Na, mental retardation, HTN  ID: Gangrenous L heel wound. WBC elevated 13. Scr 2.16 (CKD)  Vanco 8/7>> Zosyn 8/7>>  Vancomycin  1000 mg IV Q 24 hrs. Goal AUC 400-550. Expected AUC: 491 SCr used: 2.16   Plan: Zosyn 3.375g IV q 8hr. Vancomycin 1g IV q 24h. Level at steady state after 3-5 doses if continued.     Height: 5\' 10"  (177.8 cm) Weight: 169 lb 12.1 oz (77 kg) IBW/kg (Calculated) : 73  Temp (24hrs), Avg:97.5 F (36.4 C), Min:97.3 F (36.3 C), Max:97.6 F (36.4 C)  Recent Labs  Lab 04/17/2019 0934  WBC 13.0*  CREATININE 2.16*    Estimated Creatinine Clearance: 39 mL/min (A) (by C-G formula based on SCr of 2.16 mg/dL (H)).    No Known Allergies   Shanita Kanan S. Alford Highland, PharmD, BCPS Clinical Staff Pharmacist Eilene Ghazi Stillinger 05/04/2019 4:45 PM

## 2019-04-10 NOTE — Progress Notes (Signed)
Dr Tamala Julian paged about orders to admit pt

## 2019-04-11 DIAGNOSIS — E538 Deficiency of other specified B group vitamins: Secondary | ICD-10-CM | POA: Diagnosis not present

## 2019-04-11 DIAGNOSIS — D500 Iron deficiency anemia secondary to blood loss (chronic): Secondary | ICD-10-CM | POA: Diagnosis not present

## 2019-04-11 DIAGNOSIS — L8990 Pressure ulcer of unspecified site, unspecified stage: Secondary | ICD-10-CM | POA: Insufficient documentation

## 2019-04-11 DIAGNOSIS — N179 Acute kidney failure, unspecified: Secondary | ICD-10-CM | POA: Diagnosis not present

## 2019-04-11 DIAGNOSIS — Z960 Presence of urogenital implants: Secondary | ICD-10-CM | POA: Diagnosis not present

## 2019-04-11 DIAGNOSIS — D649 Anemia, unspecified: Secondary | ICD-10-CM

## 2019-04-11 DIAGNOSIS — I70244 Atherosclerosis of native arteries of left leg with ulceration of heel and midfoot: Secondary | ICD-10-CM

## 2019-04-11 DIAGNOSIS — E1165 Type 2 diabetes mellitus with hyperglycemia: Secondary | ICD-10-CM | POA: Diagnosis not present

## 2019-04-11 LAB — TYPE AND SCREEN
ABO/RH(D): A POS
Antibody Screen: NEGATIVE
Unit division: 0
Unit division: 0

## 2019-04-11 LAB — CBC
HCT: 30.8 % — ABNORMAL LOW (ref 39.0–52.0)
Hemoglobin: 10 g/dL — ABNORMAL LOW (ref 13.0–17.0)
MCH: 26.3 pg (ref 26.0–34.0)
MCHC: 32.5 g/dL (ref 30.0–36.0)
MCV: 81.1 fL (ref 80.0–100.0)
Platelets: 398 10*3/uL (ref 150–400)
RBC: 3.8 MIL/uL — ABNORMAL LOW (ref 4.22–5.81)
RDW: 17.6 % — ABNORMAL HIGH (ref 11.5–15.5)
WBC: 11.2 10*3/uL — ABNORMAL HIGH (ref 4.0–10.5)
nRBC: 0 % (ref 0.0–0.2)

## 2019-04-11 LAB — BPAM RBC
Blood Product Expiration Date: 202008212359
Blood Product Expiration Date: 202009032359
ISSUE DATE / TIME: 202008071937
ISSUE DATE / TIME: 202008072348
Unit Type and Rh: 6200
Unit Type and Rh: 6200

## 2019-04-11 LAB — BASIC METABOLIC PANEL
Anion gap: 16 — ABNORMAL HIGH (ref 5–15)
BUN: 41 mg/dL — ABNORMAL HIGH (ref 6–20)
CO2: 13 mmol/L — ABNORMAL LOW (ref 22–32)
Calcium: 8.3 mg/dL — ABNORMAL LOW (ref 8.9–10.3)
Chloride: 107 mmol/L (ref 98–111)
Creatinine, Ser: 2.28 mg/dL — ABNORMAL HIGH (ref 0.61–1.24)
GFR calc Af Amer: 36 mL/min — ABNORMAL LOW (ref >60)
GFR calc non Af Amer: 31 mL/min — ABNORMAL LOW (ref >60)
Glucose, Bld: 467 mg/dL — ABNORMAL HIGH (ref 70–99)
Potassium: 4.1 mmol/L (ref 3.5–5.1)
Sodium: 136 mmol/L (ref 135–145)

## 2019-04-11 LAB — MRSA PCR SCREENING: MRSA by PCR: NEGATIVE

## 2019-04-11 LAB — HEMOGLOBIN A1C
Hgb A1c MFr Bld: 6.7 % — ABNORMAL HIGH (ref 4.8–5.6)
Mean Plasma Glucose: 145.59 mg/dL

## 2019-04-11 MED ORDER — SODIUM BICARBONATE 8.4 % IV SOLN
INTRAVENOUS | Status: DC
  Administered 2019-04-11 – 2019-04-13 (×4): via INTRAVENOUS
  Filled 2019-04-11: qty 100
  Filled 2019-04-11 (×4): qty 150

## 2019-04-11 MED ORDER — FOLIC ACID 1 MG PO TABS
1.0000 mg | ORAL_TABLET | Freq: Every day | ORAL | Status: DC
  Administered 2019-04-11 – 2019-04-26 (×15): 1 mg via ORAL
  Filled 2019-04-11 (×15): qty 1

## 2019-04-11 MED ORDER — INSULIN ASPART 100 UNIT/ML ~~LOC~~ SOLN
3.0000 [IU] | Freq: Three times a day (TID) | SUBCUTANEOUS | Status: DC
  Administered 2019-04-12 – 2019-04-15 (×3): 3 [IU] via SUBCUTANEOUS

## 2019-04-11 NOTE — Progress Notes (Signed)
Daily Rounding Note  04/11/2019, 9:13 AM  LOS: 0 days   SUBJECTIVE:   Chief complaint: anemia.  FOBT negative.  Folic acid deficiency.  Anemia of chronic dz    No acute events overnight.  OBJECTIVE:         Vital signs in last 24 hours:    Temp:  [97.4 F (36.3 C)-98.6 F (37 C)] 98 F (36.7 C) (08/08 0541) Pulse Rate:  [72-91] 85 (08/08 0541) Resp:  [12-20] 20 (08/08 0541) BP: (105-135)/(47-88) 105/52 (08/08 0541) SpO2:  [96 %-100 %] 96 % (08/08 0541) Weight:  [71.4 kg] 71.4 kg (08/08 0304) Last BM Date: Pincus Badder) Filed Weights   04/07/2019 0809 04/11/19 0304  Weight: 77 kg 71.4 kg   General: Sleeping quietly, visually assessed patient, did not physically examine. Heart: RRR. Chest: Non-labored breathing. Abdomen: Nondistended.   Intake/Output from previous day: 08/07 0701 - 08/08 0700 In: 284 [Blood:284] Out: 1175 [id number]  Intake/Output this shift: No intake/output data recorded.  Lab Results: Recent Labs    04/14/2019 0934 04/07/2019 1453 04/11/19 0409  WBC 13.0*  --  11.2*  HGB 7.8* 6.9* 10.0*  HCT 25.4* 21.9* 30.8*  PLT 491*  --  398   BMET Recent Labs    04/20/2019 0934 04/11/19 0409  NA 139 136  K 4.0 4.1  CL 108 107  CO2 18* 13*  GLUCOSE 111* 467*  BUN 46* 41*  CREATININE 2.16* 2.28*  CALCIUM 9.2 8.3*   LFT No results for input(s): PROT, ALBUMIN, AST, ALT, ALKPHOS, BILITOT, BILIDIR, IBILI in the last 72 hours. PT/INR No results for input(s): LABPROT, INR in the last 72 hours. Hepatitis Panel No results for input(s): HEPBSAG, HCVAB, HEPAIGM, HEPBIGM in the last 72 hours.  Studies/Results: No results found.   Scheduled Meds: . sodium chloride   Intravenous Once  . amLODipine  10 mg Oral Daily  . folic acid  1 mg Oral Daily  . hydrALAZINE  25 mg Oral TID  . insulin detemir  10 Units Subcutaneous Daily  . levETIRAcetam  250 mg Oral BID  . mirtazapine  15 mg Oral QHS  .  pantoprazole  40 mg Oral Q0600  . sertraline  200 mg Oral Daily  . sodium chloride flush  3 mL Intravenous Q12H  . ziprasidone  20 mg Oral Daily  . ziprasidone  40 mg Oral QHS   Continuous Infusions: . sodium chloride 50 mL/hr at 04/26/2019 1605  . ferumoxytol 510 mg (04/11/19 0902)  . piperacillin-tazobactam (ZOSYN)  IV 3.375 g (04/11/19 0235)  . vancomycin     PRN Meds:.acetaminophen **OR** acetaminophen, albuterol, ondansetron **OR** ondansetron (ZOFRAN) IV   ASSESMENT:   *  Anemia.  Low iron, TIBC, iron sats, folate.  .  Ferritin, b12 above normal.    hgb 6.9 >> 2 PRBC >> 10  *    Gangrenous foot ulcer.  outpt antibiotic with Ertapenem.  IV vancomycin,Zosyn currently.  Not a candidate for revascularization,  AKA recommended.   *     Hx recurrent UTI.  Suprapubic catheter in place for hx urinary stricture.    *   Severe autism with mutism.  Communicates with nods.    PLAN   *    Dr Grandville Silos began po folic acid  this AM.   Received Feraheme today.   *   With FOBT negative stool, not clear how aggressively GI will pursue endoscopy/coloniscopy.       Judson Roch  Edlyn [place]burg  04/11/2019, 9:13 AM Phone [phone]

## 2019-04-11 NOTE — Progress Notes (Addendum)
PROGRESS NOTE    [name]  [id number] DOB: [birth year] DOA: 04/29/2019 PCP: Rosita Fire, MD    Brief Narrative:  HPI per Dr. Nonie Hoyer Kennedy is a 58 y.o. male with medical history significant of severe autism with elective mutism, schizoaffective disorder, diabetes mellitus type 2, chronic kidney disease, chronic suprapubic catheter for urinary strictures, who presented for CT angiogram of the left leg with Dr. Bobette Mo for nonhealing ulcer.  However, patient was found to have hemoglobin 7.8 for which the procedure was canceled.  Social worker is present at bedside and provides history as the patient is unable to provide his own.  At baseline patient is wheelchair-bound.  The wound first appeared a couple months ago.  They were not able to get into the wound care center until June 23.  He underwent surgical debridement on June 25.  During that time he was noted to have dropping hemoglobin for which he was referred to Dr. Lowanda Foster of nephrology.  Hemoglobin noted to drop to 6.8, and Dr. Lowanda Foster started him on iron infusions.  His first infusion given on July 7.  Subsequently, patient was admitted to [facility] on the 9th for a urinary tract infection, and on July 10 was transfused 2 units of blood as his hemoglobin was down to 6.3.  Stool guaiacs were reported to be negative at that time.  Patient received the second iron fusion on the 30th, but had not been formally seen by Dr. Lowanda Foster.   Assessment & Plan:   Principal Problem:   Anemia, blood loss Active Problems:   Intellectual disability   Acute kidney injury superimposed on CKD (HCC)   Chronic indwelling Foley catheter   Diabetes mellitus out of control (HCC)   Leukocytosis   History of seizure   Gangrene ([place])   Pressure injury of skin   1 iron deficiency anemia/anemia of chronic disease/folate deficiency Patient noted to have a hemoglobin of 7.8 with low MCV.  Patient also noted to have an  elevated RDW and concern for iron deficiency anemia.  Patient noted to have been started on iron transfusions per nephrology in the outpatient setting.  Patient noted to have prior reports of guaiac negative stools when evaluated.  Patient felt not to be actively bleeding at this time.  FOBT done was negative.  Repeat hemoglobin done noted patient to have a hemoglobin of 6.9.  Patient transfused 2 units packed red blood cells hemoglobin currently at 10.0.  Patient receiving IV Feraheme this morning.  Continue PPI.  Anemia panel consistent with anemia of chronic disease/iron deficiency anemia.  Folate levels are low at 3.8.  Vitamin B12 levels elevated at 1214.  Will start patient on folic acid 1 mg daily.  Patient seen in consultation by gastroenterology who suspected patient does have a iron deficiency anemia with elevated ferritin secondary to acute phase reactant with superimposed folate deficiency.  Per GI due to lack of overt GI blood loss, negative FOBT,'s stable H&H no further inpatient work-up needed at this time and if further endoscopic work-up is needed can be done in the outpatient setting.  Follow H&H.  Appreciate GIs input and recommendations.  2.  Left heel gangrene  Patient was admitted in the process of undergoing arteriogram on day of admission with vascular surgery which was canceled due to patient's anemia.  WBC noted to be elevated at 13.  Patient afebrile.  Patient initially started on ertapenem IV in the outpatient setting.  Patient seen by vascular surgery who  suggested patient likely needs above-the-knee amputation.  Patient has been pancultured results pending.  Continue IV Zosyn and IV vancomycin.  Patient being followed by vascular surgery who recommended AKA next week.  Per vascular surgery.  3.  Acute kidney injury superimposed on chronic  kidney disease stage III/chronicsuprapubic catheter Unclear baseline.  Urinalysis ordered but not done.  Will reorder UA.  Reorder a urine  sodium and urine creatinine.  Patient with a urine output of 1.175 L over the past 24 hours.  Repeat labs in the morning.  4.  Hypertension Stable.  Continue amlodipine and hydralazine.  5.  Diabetes mellitus type 2 well controlled Patient reported to be a brittle diabetic.  Hemoglobin A1c was 6.7.  CBG of 87 this morning.  Continue to hold oral hypoglycemic agents.  Continue current regimen of Levemir and sliding scale insulin.  6.  Schizoaffective disorder Continue Geodon, Zoloft, Remeron.  7.  Autism/intellectual disability Patient autistic at baseline and electively mute.  Patient nods his head or shakes his head to answer yes or no.  Patient noted to be able to write.  8.  History of seizure disorder Stable.  Continue home regimen Keppra.  9.  Metabolic acidosis Patient noted to be acidotic with a bicarb of 13.  Likely secondary to left gangrene is healed.  Change IV fluids to bicarb drip.  Continue empiric IV antibiotics.  Follow.   DVT prophylaxis: SCDs Code Status: Full Family Communication: No family at bedside. Disposition Plan: Likely discharge home when okay with vascular surgery.   Consultants:   Gastroenterology: Dr. Bryan Lemma 04/15/2019  Vascular surgery Scot Dock 04/23/2019  Procedures:   Transfused 2 units packed red blood cells 04/06/2019  Antimicrobials:   IV Zosyn 04/04/2019  IV vancomycin 04/19/2019   Subjective: Patient with mutism, patient answers yes or no to questions by nodding and shaking his head.  Patient denies any overt bleeding.  Patient receiving IV Feraheme.  Objective: Vitals:   04/11/19 0013 04/11/19 0220 04/11/19 0304 04/11/19 0541  BP: (!) 114/56 120/64  (!) 105/52  Pulse: 72 72  85  Resp: 16 18  20   Temp: 98.5 F (36.9 C) 98.6 F (37 C)  98 F (36.7 C)  TempSrc: Oral Oral  Oral  SpO2: 100% 100%  96%  Weight:   71.4 kg   Height:        Intake/Output Summary (Last 24 hours) at 04/11/2019 1017 Last data filed at 04/11/2019 0541  Gross per 24 hour  Intake 284 ml  Output 1175 ml  Net -891 ml   Filed Weights   05/01/2019 0809 04/11/19 0304  Weight: 77 kg 71.4 kg    Examination:  General exam: Appears calm and comfortable  Respiratory system: Clear to auscultation anterior lung fields. Respiratory effort normal. Cardiovascular system: S1 & S2 heard, RRR. No JVD, murmurs, rubs, gallops or clicks. No pedal edema. Gastrointestinal system: Abdomen is nondistended, soft and nontender. No organomegaly or masses felt. Normal bowel sounds heard. Central nervous system: Alert and oriented. No focal neurological deficits. Extremities: Contractures of lower extremities.  Left heel wrapped in bandage.  Skin: Left heel wrapped in bandage.   Psychiatry: Judgement and insight unable to assess as patient with mutism. Mood & affect appropriate.     Data Reviewed: I have personally reviewed following labs and imaging studies  CBC: Recent Labs  Lab 04/19/2019 0934 04/11/2019 1453 04/11/19 0409  WBC 13.0*  --  11.2*  HGB 7.8* 6.9* 10.0*  HCT 25.4* 21.9* 30.8*  MCV 80.4  --  81.1  PLT 491*  --  123456   Basic Metabolic Panel: Recent Labs  Lab 04/19/2019 0934 04/11/19 0409  NA 139 136  K 4.0 4.1  CL 108 107  CO2 18* 13*  GLUCOSE 111* 467*  BUN 46* 41*  CREATININE 2.16* 2.28*  CALCIUM 9.2 8.3*   GFR: Estimated Creatinine Clearance: 36.1 mL/min (A) (by C-G formula based on SCr of 2.28 mg/dL (H)). Liver Function Tests: No results for input(s): AST, ALT, ALKPHOS, BILITOT, PROT, ALBUMIN in the last 168 hours. No results for input(s): LIPASE, AMYLASE in the last 168 hours. No results for input(s): AMMONIA in the last 168 hours. Coagulation Profile: No results for input(s): INR, PROTIME in the last 168 hours. Cardiac Enzymes: No results for input(s): CKTOTAL, CKMB, CKMBINDEX, TROPONINI in the last 168 hours. BNP (last 3 results) No results for input(s): PROBNP in the last 8760 hours. HbA1C: Recent Labs    04/11/19  0409  HGBA1C 6.7*   CBG: Recent Labs  Lab 04/04/2019 0818 04/29/2019 1211 04/19/2019 1818  GLUCAP 87 108* 170*   Lipid Profile: No results for input(s): CHOL, HDL, LDLCALC, TRIG, CHOLHDL, LDLDIRECT in the last 72 hours. Thyroid Function Tests: No results for input(s): TSH, T4TOTAL, FREET4, T3FREE, THYROIDAB in the last 72 hours. Anemia Panel: Recent Labs    04/04/2019 1756  VITAMINB12 1,214*  FOLATE 3.8*  FERRITIN 1,671*  TIBC 162*  IRON 13*  RETICCTPCT 1.5   Sepsis Labs: No results for input(s): PROCALCITON, LATICACIDVEN in the last 168 hours.  Recent Results (from the past 240 hour(s))  SARS Coronavirus 2 [facility] order, Performed in [facility] hospital lab) Nasopharyngeal Nasopharyngeal Swab     Status: None   Collection Time: 04/08/2019  8:30 AM   Specimen: Nasopharyngeal Swab  Result Value Ref Range Status   SARS Coronavirus 2 NEGATIVE NEGATIVE Final    Comment: (NOTE) If result is NEGATIVE SARS-CoV-2 target nucleic acids are NOT DETECTED. The SARS-CoV-2 RNA is generally detectable in upper and lower  respiratory specimens during the acute phase of infection. The lowest  concentration of SARS-CoV-2 viral copies this assay can detect is 250  copies / mL. A negative result does not preclude SARS-CoV-2 infection  and should not be used as the sole basis for treatment or other  patient management decisions.  A negative result may occur with  improper specimen collection / handling, submission of specimen other  than nasopharyngeal swab, presence of viral mutation(s) within the  areas targeted by this assay, and inadequate number of viral copies  (<250 copies / mL). A negative result must be combined with clinical  observations, patient history, and epidemiological information. If result is POSITIVE SARS-CoV-2 target nucleic acids are DETECTED. The SARS-CoV-2 RNA is generally detectable in upper and lower  respiratory specimens dur ing the acute phase of infection.   Positive  results are indicative of active infection with SARS-CoV-2.  Clinical  correlation with patient history and other diagnostic information is  necessary to determine patient infection status.  Positive results do  not rule out bacterial infection or co-infection with other viruses. If result is PRESUMPTIVE POSTIVE SARS-CoV-2 nucleic acids MAY BE PRESENT.   A presumptive positive result was obtained on the submitted specimen  and confirmed on repeat testing.  While 2019 novel coronavirus  (SARS-CoV-2) nucleic acids may be present in the submitted sample  additional confirmatory testing may be necessary for epidemiological  and / or clinical management purposes  to differentiate  between  SARS-CoV-2 and other Sarbecovirus currently known to infect humans.  If clinically indicated additional testing with an alternate test  methodology [phone]) is advised. The SARS-CoV-2 RNA is generally  detectable in upper and lower respiratory sp ecimens during the acute  phase of infection. The expected result is Negative. Fact Sheet for Patients:  StrictlyIdeas.no Fact Sheet for Healthcare Providers: BankingDealers.co.za This test is not yet approved or cleared by the [place] FDA and has been authorized for detection and/or diagnosis of SARS-CoV-2 by FDA under an Emergency Use Authorization (EUA).  This EUA will remain in effect (meaning this test can be used) for the duration of the COVID-19 declaration under Section 564(b)(1) of the Act, 21 U.S.C. section 360bbb-3(b)(1), unless the authorization is terminated or revoked sooner. Performed at [facility], [place] [address] [place], [place] [address]   MRSA PCR Screening     Status: None   Collection Time: 04/11/19  5:00 AM   Specimen: Nasopharyngeal  Result Value Ref Range Status   MRSA by PCR NEGATIVE NEGATIVE Final    Comment:        The GeneXpert MRSA Assay (FDA approved for  NASAL specimens only), is one component of a comprehensive MRSA colonization surveillance program. It is not intended to diagnose MRSA infection nor to guide or monitor treatment for MRSA infections. Performed at [facility], [place] [address], [place], [place] [address]          Radiology Studies: No results found.      Scheduled Meds: . sodium chloride   Intravenous Once  . amLODipine  10 mg Oral Daily  . folic acid  1 mg Oral Daily  . hydrALAZINE  25 mg Oral TID  . insulin detemir  10 Units Subcutaneous Daily  . levETIRAcetam  250 mg Oral BID  . mirtazapine  15 mg Oral QHS  . pantoprazole  40 mg Oral Q0600  . sertraline  200 mg Oral Daily  . sodium chloride flush  3 mL Intravenous Q12H  . ziprasidone  20 mg Oral Daily  . ziprasidone  40 mg Oral QHS   Continuous Infusions: . piperacillin-tazobactam (ZOSYN)  IV 3.375 g (04/11/19 0235)  .  sodium bicarbonate  infusion 1000 mL    . vancomycin       LOS: 0 days    Time spent: 40 minutes    Irine Seal, MD [facility]  If 7PM-7AM, please contact night-coverage [email or web address] 04/11/2019, 10:17 AM

## 2019-04-11 NOTE — Consult Note (Signed)
[facility] Nurse wound consult note Reason for Consult:Gangrene of the left heel. WOC Nursing is simultaneously consulted with Vascular Surgery and both Dr. Scot Dock and Dr. Trula Slade have seen the patient.  An AKA is planned for this week as soon as patient can be stabilized medically. Wound type:Infectious Pressure Injury POA:N/A There is no role for WOC nursing at this time.  We will sign off in dererance to Vascular Surgery for a POC.  [facility] nursing team will not follow, but will remain available to this patient, the nursing and medical teams.  Please re-consult if needed. Thanks, Maudie Flakes, MSN, RN, [place], Arther Abbott  Pager# [phone]

## 2019-04-11 NOTE — Progress Notes (Addendum)
Patient vomited 250 mls of brown fluid, then a small amount of clear fluid. Given Zofran 4 mg IV. Remained with patient for 10 minutes and when asked if he was okay he nodded yes. Will monitor for any further GI issues.  Patient had chocolate pudding for lunch and emesis was not coffee ground emesis or appear to be related to any bleeding.

## 2019-04-11 NOTE — Progress Notes (Signed)
    Subjective  -   Angio cancelled yesterday due to decreased Hb, and significantly deteriorated left heel wound.  Left AKA was recommended.  Transfused yesterday  Physical Exam:  Left heel ulcer abd soft Non-labored breathing     Assessment/Plan:    Left heel ulcer:  Patient is wheelchair bound.  He will need left AKA next week, once medical issues resolved  [name] 04/11/2019 7:58 AM --  Vitals:   04/11/19 0220 04/11/19 0541  BP: 120/64 (!) 105/52  Pulse: 72 85  Resp: 18 20  Temp: 98.6 F (37 C) 98 F (36.7 C)  SpO2: 100% 96%    Intake/Output Summary (Last 24 hours) at 04/11/2019 0758 Last data filed at 04/11/2019 0541 Gross per 24 hour  Intake 284 ml  Output 1175 ml  Net -891 ml     Laboratory CBC    Component Value Date/Time   WBC 11.2 (H) 04/11/2019 0409   HGB 10.0 (L) 04/11/2019 0409   HCT 30.8 (L) 04/11/2019 0409   PLT 398 04/11/2019 0409    BMET    Component Value Date/Time   NA 136 04/11/2019 0409   K 4.1 04/11/2019 0409   CL 107 04/11/2019 0409   CO2 13 (L) 04/11/2019 0409   GLUCOSE 467 (H) 04/11/2019 0409   BUN 41 (H) 04/11/2019 0409   CREATININE 2.28 (H) 04/11/2019 0409   CALCIUM 8.3 (L) 04/11/2019 0409   GFRNONAA 31 (L) 04/11/2019 0409   GFRAA 36 (L) 04/11/2019 0409    COAG Lab Results  Component Value Date   INR 1.24 02/16/2012   INR 1.06 01/15/2011   No results found for: PTT  Antibiotics Anti-infectives (From admission, onward)   Start     Dose/Rate Route Frequency Ordered Stop   04/11/19 1700  vancomycin (VANCOCIN) IVPB 1000 mg/200 mL premix     1,000 mg 200 mL/hr over 60 Minutes Intravenous Every 24 hours 04/25/2019 1645     04/11/19 0100  piperacillin-tazobactam (ZOSYN) IVPB 3.375 g     3.375 g 12.5 mL/hr over 240 Minutes Intravenous Every 8 hours 04/04/2019 1641     04/30/2019 1615  vancomycin (VANCOCIN) IVPB 1000 mg/200 mL premix     1,000 mg 200 mL/hr over 60 Minutes Intravenous  Once 04/09/2019 1611 05/04/2019  2321   05/02/2019 1615  piperacillin-tazobactam (ZOSYN) IVPB 3.375 g     3.375 g 100 mL/hr over 30 Minutes Intravenous  Once 04/13/2019 1611 04/21/2019 1919       V. Leia Alf, M.D., [facility] [place] Office: [phone] Pager:  [phone]

## 2019-04-11 NOTE — Progress Notes (Signed)
PT Cancellation Note  Patient Details Name: [name] MRN: [id number] DOB: 17-May-1961   Cancelled Treatment:    Reason Eval/Treat Not Completed: Fatigue/lethargy limiting ability to participate.  Pt has just vomited per nsg note, and nodded no to therapy and no to being in pain.  Agreed with yes nod to have PT try again tomorrow.   Ramond Dial 04/11/2019, 2:34 PM   Mee Hives, PT MS Acute Rehab Dept. Number: [employer] and [place]

## 2019-04-12 DIAGNOSIS — Z66 Do not resuscitate: Secondary | ICD-10-CM | POA: Diagnosis not present

## 2019-04-12 DIAGNOSIS — J9601 Acute respiratory failure with hypoxia: Secondary | ICD-10-CM | POA: Diagnosis not present

## 2019-04-12 DIAGNOSIS — D500 Iron deficiency anemia secondary to blood loss (chronic): Secondary | ICD-10-CM | POA: Diagnosis not present

## 2019-04-12 DIAGNOSIS — J690 Pneumonitis due to inhalation of food and vomit: Secondary | ICD-10-CM | POA: Diagnosis not present

## 2019-04-12 DIAGNOSIS — D509 Iron deficiency anemia, unspecified: Secondary | ICD-10-CM

## 2019-04-12 DIAGNOSIS — A480 Gas gangrene: Secondary | ICD-10-CM | POA: Diagnosis present

## 2019-04-12 DIAGNOSIS — N179 Acute kidney failure, unspecified: Secondary | ICD-10-CM | POA: Diagnosis present

## 2019-04-12 DIAGNOSIS — R0609 Other forms of dyspnea: Secondary | ICD-10-CM | POA: Diagnosis not present

## 2019-04-12 DIAGNOSIS — D649 Anemia, unspecified: Secondary | ICD-10-CM | POA: Diagnosis present

## 2019-04-12 DIAGNOSIS — I70262 Atherosclerosis of native arteries of extremities with gangrene, left leg: Secondary | ICD-10-CM | POA: Diagnosis not present

## 2019-04-12 DIAGNOSIS — N184 Chronic kidney disease, stage 4 (severe): Secondary | ICD-10-CM | POA: Diagnosis present

## 2019-04-12 DIAGNOSIS — J189 Pneumonia, unspecified organism: Secondary | ICD-10-CM | POA: Diagnosis not present

## 2019-04-12 DIAGNOSIS — J80 Acute respiratory distress syndrome: Secondary | ICD-10-CM | POA: Diagnosis not present

## 2019-04-12 DIAGNOSIS — R569 Unspecified convulsions: Secondary | ICD-10-CM | POA: Diagnosis not present

## 2019-04-12 DIAGNOSIS — G92 Toxic encephalopathy: Secondary | ICD-10-CM | POA: Diagnosis not present

## 2019-04-12 DIAGNOSIS — E8770 Fluid overload, unspecified: Secondary | ICD-10-CM | POA: Diagnosis not present

## 2019-04-12 DIAGNOSIS — I5031 Acute diastolic (congestive) heart failure: Secondary | ICD-10-CM | POA: Diagnosis present

## 2019-04-12 DIAGNOSIS — J954 Chemical pneumonitis due to anesthesia: Secondary | ICD-10-CM | POA: Diagnosis not present

## 2019-04-12 DIAGNOSIS — E1122 Type 2 diabetes mellitus with diabetic chronic kidney disease: Secondary | ICD-10-CM | POA: Diagnosis present

## 2019-04-12 DIAGNOSIS — E538 Deficiency of other specified B group vitamins: Secondary | ICD-10-CM | POA: Diagnosis not present

## 2019-04-12 DIAGNOSIS — J9589 Other postprocedural complications and disorders of respiratory system, not elsewhere classified: Secondary | ICD-10-CM | POA: Diagnosis not present

## 2019-04-12 DIAGNOSIS — Z9911 Dependence on respirator [ventilator] status: Secondary | ICD-10-CM | POA: Diagnosis not present

## 2019-04-12 DIAGNOSIS — I739 Peripheral vascular disease, unspecified: Secondary | ICD-10-CM | POA: Diagnosis not present

## 2019-04-12 DIAGNOSIS — E1152 Type 2 diabetes mellitus with diabetic peripheral angiopathy with gangrene: Secondary | ICD-10-CM | POA: Diagnosis present

## 2019-04-12 DIAGNOSIS — Y95 Nosocomial condition: Secondary | ICD-10-CM | POA: Diagnosis not present

## 2019-04-12 DIAGNOSIS — Z960 Presence of urogenital implants: Secondary | ICD-10-CM | POA: Diagnosis not present

## 2019-04-12 DIAGNOSIS — I70244 Atherosclerosis of native arteries of left leg with ulceration of heel and midfoot: Secondary | ICD-10-CM | POA: Diagnosis not present

## 2019-04-12 DIAGNOSIS — R251 Tremor, unspecified: Secondary | ICD-10-CM | POA: Diagnosis not present

## 2019-04-12 DIAGNOSIS — F840 Autistic disorder: Secondary | ICD-10-CM | POA: Diagnosis present

## 2019-04-12 DIAGNOSIS — R188 Other ascites: Secondary | ICD-10-CM | POA: Diagnosis present

## 2019-04-12 DIAGNOSIS — A4189 Other specified sepsis: Secondary | ICD-10-CM | POA: Diagnosis not present

## 2019-04-12 DIAGNOSIS — R509 Fever, unspecified: Secondary | ICD-10-CM | POA: Diagnosis not present

## 2019-04-12 DIAGNOSIS — R680 Hypothermia, not associated with low environmental temperature: Secondary | ICD-10-CM | POA: Diagnosis not present

## 2019-04-12 DIAGNOSIS — N189 Chronic kidney disease, unspecified: Secondary | ICD-10-CM | POA: Diagnosis not present

## 2019-04-12 DIAGNOSIS — L97429 Non-pressure chronic ulcer of left heel and midfoot with unspecified severity: Secondary | ICD-10-CM | POA: Diagnosis present

## 2019-04-12 DIAGNOSIS — F79 Unspecified intellectual disabilities: Secondary | ICD-10-CM | POA: Diagnosis present

## 2019-04-12 DIAGNOSIS — I471 Supraventricular tachycardia: Secondary | ICD-10-CM | POA: Diagnosis not present

## 2019-04-12 DIAGNOSIS — D62 Acute posthemorrhagic anemia: Secondary | ICD-10-CM | POA: Diagnosis present

## 2019-04-12 DIAGNOSIS — Z87898 Personal history of other specified conditions: Secondary | ICD-10-CM | POA: Diagnosis not present

## 2019-04-12 DIAGNOSIS — R6521 Severe sepsis with septic shock: Secondary | ICD-10-CM | POA: Diagnosis not present

## 2019-04-12 DIAGNOSIS — E874 Mixed disorder of acid-base balance: Secondary | ICD-10-CM | POA: Diagnosis present

## 2019-04-12 DIAGNOSIS — J1289 Other viral pneumonia: Secondary | ICD-10-CM | POA: Diagnosis not present

## 2019-04-12 DIAGNOSIS — B3741 Candidal cystitis and urethritis: Secondary | ICD-10-CM | POA: Diagnosis not present

## 2019-04-12 DIAGNOSIS — E1165 Type 2 diabetes mellitus with hyperglycemia: Secondary | ICD-10-CM | POA: Diagnosis not present

## 2019-04-12 DIAGNOSIS — U071 COVID-19: Secondary | ICD-10-CM | POA: Diagnosis not present

## 2019-04-12 DIAGNOSIS — I130 Hypertensive heart and chronic kidney disease with heart failure and stage 1 through stage 4 chronic kidney disease, or unspecified chronic kidney disease: Secondary | ICD-10-CM | POA: Diagnosis present

## 2019-04-12 DIAGNOSIS — I96 Gangrene, not elsewhere classified: Secondary | ICD-10-CM | POA: Diagnosis not present

## 2019-04-12 LAB — BASIC METABOLIC PANEL
Anion gap: 15 (ref 5–15)
BUN: 34 mg/dL — ABNORMAL HIGH (ref 6–20)
CO2: 21 mmol/L — ABNORMAL LOW (ref 22–32)
Calcium: 8.4 mg/dL — ABNORMAL LOW (ref 8.9–10.3)
Chloride: 103 mmol/L (ref 98–111)
Creatinine, Ser: 2.39 mg/dL — ABNORMAL HIGH (ref 0.61–1.24)
GFR calc Af Amer: 34 mL/min — ABNORMAL LOW (ref >60)
GFR calc non Af Amer: 29 mL/min — ABNORMAL LOW (ref >60)
Glucose, Bld: 339 mg/dL — ABNORMAL HIGH (ref 70–99)
Potassium: 3.5 mmol/L (ref 3.5–5.1)
Sodium: 139 mmol/L (ref 135–145)

## 2019-04-12 LAB — CBC WITH DIFFERENTIAL/PLATELET
Abs Immature Granulocytes: 0.06 10*3/uL (ref 0.00–0.07)
Basophils Absolute: 0.1 10*3/uL (ref 0.0–0.1)
Basophils Relative: 1 %
Eosinophils Absolute: 0.8 10*3/uL — ABNORMAL HIGH (ref 0.0–0.5)
Eosinophils Relative: 7 %
HCT: 29.6 % — ABNORMAL LOW (ref 39.0–52.0)
Hemoglobin: 9.8 g/dL — ABNORMAL LOW (ref 13.0–17.0)
Immature Granulocytes: 1 %
Lymphocytes Relative: 13 %
Lymphs Abs: 1.5 10*3/uL (ref 0.7–4.0)
MCH: 26.1 pg (ref 26.0–34.0)
MCHC: 33.1 g/dL (ref 30.0–36.0)
MCV: 78.9 fL — ABNORMAL LOW (ref 80.0–100.0)
Monocytes Absolute: 1 10*3/uL (ref 0.1–1.0)
Monocytes Relative: 8 %
Neutro Abs: 8.4 10*3/uL — ABNORMAL HIGH (ref 1.7–7.7)
Neutrophils Relative %: 70 %
Platelets: 393 10*3/uL (ref 150–400)
RBC: 3.75 MIL/uL — ABNORMAL LOW (ref 4.22–5.81)
RDW: 17.6 % — ABNORMAL HIGH (ref 11.5–15.5)
WBC: 11.8 10*3/uL — ABNORMAL HIGH (ref 4.0–10.5)
nRBC: 0 % (ref 0.0–0.2)

## 2019-04-12 LAB — GLUCOSE, CAPILLARY
Glucose-Capillary: 124 mg/dL — ABNORMAL HIGH (ref 70–99)
Glucose-Capillary: 138 mg/dL — ABNORMAL HIGH (ref 70–99)
Glucose-Capillary: 69 mg/dL — ABNORMAL LOW (ref 70–99)
Glucose-Capillary: 169 mg/dL — ABNORMAL HIGH (ref 70–99)

## 2019-04-12 MED ORDER — POTASSIUM CHLORIDE 20 MEQ PO PACK
20.0000 meq | PACK | Freq: Once | ORAL | Status: DC
  Filled 2019-04-12: qty 1

## 2019-04-12 MED ORDER — INSULIN ASPART 100 UNIT/ML ~~LOC~~ SOLN
0.0000 [IU] | Freq: Three times a day (TID) | SUBCUTANEOUS | Status: DC
  Administered 2019-04-12 – 2019-04-13 (×3): 1 [IU] via SUBCUTANEOUS
  Administered 2019-04-13: 12:00:00 3 [IU] via SUBCUTANEOUS
  Administered 2019-04-14: 7 [IU] via SUBCUTANEOUS
  Administered 2019-04-15: 1 [IU] via SUBCUTANEOUS
  Administered 2019-04-15: 7 [IU] via SUBCUTANEOUS
  Administered 2019-04-16: 18:00:00 2 [IU] via SUBCUTANEOUS
  Administered 2019-04-17: 17:00:00 9 [IU] via SUBCUTANEOUS
  Administered 2019-04-17: 7 [IU] via SUBCUTANEOUS
  Administered 2019-04-18: 9 [IU] via SUBCUTANEOUS
  Administered 2019-04-19: 10:00:00 5 [IU] via SUBCUTANEOUS
  Administered 2019-04-19: 18:00:00 3 [IU] via SUBCUTANEOUS
  Administered 2019-04-19: 2 [IU] via SUBCUTANEOUS
  Administered 2019-04-20: 18:00:00 5 [IU] via SUBCUTANEOUS
  Administered 2019-04-20 (×2): 2 [IU] via SUBCUTANEOUS
  Administered 2019-04-21: 11:00:00 7 [IU] via SUBCUTANEOUS
  Administered 2019-04-21: 2 [IU] via SUBCUTANEOUS
  Administered 2019-04-22: 17:00:00 1 [IU] via SUBCUTANEOUS
  Administered 2019-04-22: 5 [IU] via SUBCUTANEOUS
  Administered 2019-04-22: 09:00:00 9 [IU] via SUBCUTANEOUS
  Administered 2019-04-23: 13:00:00 5 [IU] via SUBCUTANEOUS
  Administered 2019-04-23: 2 [IU] via SUBCUTANEOUS
  Administered 2019-04-23: 09:00:00 7 [IU] via SUBCUTANEOUS
  Administered 2019-04-24 – 2019-04-25 (×4): 2 [IU] via SUBCUTANEOUS
  Administered 2019-04-25: 12:00:00 1 [IU] via SUBCUTANEOUS

## 2019-04-12 MED ORDER — POTASSIUM CHLORIDE CRYS ER 20 MEQ PO TBCR
20.0000 meq | EXTENDED_RELEASE_TABLET | Freq: Once | ORAL | Status: AC
  Administered 2019-04-12: 14:00:00 20 meq via ORAL
  Filled 2019-04-12: qty 1

## 2019-04-12 NOTE — Evaluation (Signed)
Occupational Therapy Evaluation Patient Details Name: [name] MRN: [id number] DOB: 07-01-1961 Today's Date: 04/12/2019    History of Present Illness Pt is a 58 y/o male who presents with anemia and L heel wound, found to be gangrenous. Pt was assessed by vascular surgery and it appears an AKA is planned for next week (week of 8/10). PMH significant for autism with elective mutism, schizophrenic disorder, DM, suprapubic catheter.   Clinical Impression   PTA, pt was residing at [facility] and required assistance with ADLS and was performing transfers to/from wheelchair; per chart review. Pt currently requiring Mod-Max A for UB ADLs, Total A for LB ADLs, and Min-Mod A for bed mobility (rolling for toilet hygiene after bowel incontinence). Pt following cues to roll in bed and reposition. Pt with several areas of skin breakdown and would recommend air mattress to reduce risk of skin breakdown. Pt would benefit from further acute OT to facilitate safe dc. Recommend dc to SNF for further OT to optimize safety, independence with ADLs, and return to PLOF.      Follow Up Recommendations  SNF;Supervision/Assistance - 24 hour    Equipment Recommendations  None recommended by OT    Recommendations for Other Services       Precautions / Restrictions Precautions Precautions: Fall Precaution Comments: Elective mutism, suprapubic catheter, skin breakdown in perineal area. Restrictions Weight Bearing Restrictions: No      Mobility Bed Mobility Overal bed mobility: Needs Assistance Bed Mobility: Rolling Rolling: Min assist;+2 for physical assistance;Mod assist         General bed mobility comments: +2 min assist to +1 mod assist for all aspects of bed mobility and positioning this session. Pt declining attempts to sit EOB, however suspect a heavier +2 if transitioning to/from EOB. Pt was able to reach up for headboard of bed to help pull himself up to [facility].   Transfers                  General transfer comment: Did not attempt this session per pt request.     Balance Overall balance assessment: (Not able to assess this session. )                                         ADL either performed or assessed with clinical judgement   ADL Overall ADL's : Needs assistance/impaired Eating/Feeding: Minimal assistance;Bed level   Grooming: Min guard;Wash/dry face;Bed level   Upper Body Bathing: Moderate assistance;Bed level   Lower Body Bathing: Total assistance;Bed level   Upper Body Dressing : Maximal assistance;Bed level   Lower Body Dressing: Total assistance;Bed level       Toileting- Clothing Manipulation and Hygiene: Total assistance;Bed level Toileting - Clothing Manipulation Details (indicate cue type and reason): Pt requiring Mod A for rolling in bed and then Total A at bed level with BM       General ADL Comments: Pt requiring Mod-Total A for ADLs and bed mobility. Agreeable to bed level movement to clean up after BM     Vision         Perception     Praxis      Pertinent Vitals/Pain Pain Assessment: No/denies pain     Hand Dominance     Extremity/Trunk Assessment Upper Extremity Assessment Upper Extremity Assessment: Overall WFL for tasks assessed   Lower Extremity Assessment Lower Extremity Assessment: Defer to PT  evaluation RLE Deficits / Details: Knee flexion contracture, at least ~25, and tendency to keep LE externally rotated while in supine. Unable to fully assess but did note some minor active movement while supine.  LLE Deficits / Details: draining heel wound; knee flexion contracture appears less on L side but still present. Minimal active movement noted.    Cervical / Trunk Assessment Cervical / Trunk Assessment: (Unable to assess)   Communication Communication Communication: Expressive difficulties(Elective mutism)   Cognition Arousal/Alertness: Awake/alert Behavior During Therapy: Flat  affect Overall Cognitive Status: History of cognitive impairments - at baseline                                 General Comments: Per chart review, history of cognitive delays   General Comments  Noting blood/drainage at subpubic cath placement; notified RN. Pt also with skin breakdown at peri area    Exercises     Shoulder Instructions      Home Living Family/patient expects to be discharged to:: Skilled nursing facility                                 Additional Comments: Long term care resident at [facility] per chart review.       Prior Functioning/Environment Level of Independence: Needs assistance        Comments: Per chart review, pt is non-ambulatory and utilizes a wheelchair for mobility. He is able to transfer himself to/from a toilet and perform some peri-care at baseline.         OT Problem List: Decreased strength;Decreased range of motion;Decreased activity tolerance;Impaired balance (sitting and/or standing);Decreased knowledge of use of DME or AE;Decreased knowledge of precautions;Decreased cognition;Pain      OT Treatment/Interventions: Self-care/ADL training;Therapeutic exercise;Energy conservation;DME and/or AE instruction;Therapeutic activities;Patient/family education    OT Goals(Current goals can be found in the care plan section) Acute Rehab OT Goals Patient Stated Goal: Pt did not state goals this session OT Goal Formulation: With patient Time For Goal Achievement: 04/26/19 Potential to Achieve Goals: Good  OT Frequency: Min 2X/week   Barriers to D/C:            Co-evaluation PT/OT/SLP Co-Evaluation/Treatment: Yes Reason for Co-Treatment: For patient/therapist safety;To address functional/ADL transfers;Complexity of the patient's impairments (multi-system involvement) PT goals addressed during session: Mobility/safety with mobility;Strengthening/ROM OT goals addressed during session: ADL's and self-care       AM-PAC OT "6 Clicks" Daily Activity     Outcome Measure Help from another person eating meals?: A Little Help from another person taking care of personal grooming?: A Little Help from another person toileting, which includes using toliet, bedpan, or urinal?: Total Help from another person bathing (including washing, rinsing, drying)?: A Lot Help from another person to put on and taking off regular upper body clothing?: A Lot Help from another person to put on and taking off regular lower body clothing?: Total 6 Click Score: 12   End of Session Nurse Communication: Mobility status;Other (comment)(Skin break down and drainage at cath placement)  Activity Tolerance: Patient limited by fatigue;Patient limited by pain;Patient tolerated treatment well Patient left: in bed;with call bell/phone within reach;with bed alarm set;with nursing/sitter in room  OT Visit Diagnosis: Unsteadiness on feet (R26.81);Other abnormalities of gait and mobility (R26.89);Muscle weakness (generalized) (M62.81)  Time: [id number] OT Time Calculation (min): 25 min Charges:  OT General Charges $OT Visit: 1 Visit OT Evaluation $OT Eval Moderate Complexity: 1 Mod  Kylon Philbrook MSOT, OTR/L Acute Rehab Pager: [phone] Office: [place] 04/12/2019, 1:26 PM

## 2019-04-12 NOTE — Evaluation (Signed)
Physical Therapy Evaluation Patient Details Name: [name] MRN: [id number] DOB: 1961-08-28 Today's Date: 04/12/2019   History of Present Illness  Pt is a 58 y/o male who presents with anemia and L heel wound, found to be gangrenous. Pt was assessed by vascular surgery and it appears an AKA is planned for next week (week of 8/10). PMH significant for autism with elective mutism, schizophrenic disorder, DM, suprapubic catheter.  Clinical Impression  Pt admitted with above diagnosis. Pt currently with functional limitations due to the deficits listed below (see PT Problem List). At the time of PT eval, pt was able to participate in bed mobility activity with up to +2 min assist/ +1 mod assist for rolling/repositioning. Anticipate a heavier +2 assist for EOB and transfer activity. Pt had a bowel movement in bed upon arrival, and focus of session was peri-care, linen change, and positioning. Noted several areas of skin breakdown on buttocks and scrotum - feel an air mattress would be beneficial to protect skin integrity while admitted. Per chart review pt was able to transfer himself to a toilet and perform some peri-care on his own. Anticipate he has had a decline in function and will require continued acute physical therapy services. Will continue to follow from a distance until planned AKA and then will reassess for most appropriate d/c disposition/rehab options. Acutely, pt will benefit from skilled PT to increase their independence and safety with mobility to allow discharge to the venue listed below.       Follow Up Recommendations SNF;Supervision/Assistance - 24 hour    Equipment Recommendations  None recommended by PT    Recommendations for Other Services       Precautions / Restrictions Precautions Precautions: Fall Precaution Comments: Elective mutism, suprapubic catheter, skin breakdown in perineal area. Restrictions Weight Bearing Restrictions: No      Mobility  Bed  Mobility Overal bed mobility: Needs Assistance Bed Mobility: Rolling Rolling: Min assist;+2 for physical assistance;Mod assist         General bed mobility comments: +2 min assist to +1 mod assist for all aspects of bed mobility and positioning this session. Pt declining attempts to sit EOB, however suspect a heavier +2 if transitioning to/from EOB. Pt was able to reach up for headboard of bed to help pull himself up to [facility].   Transfers                 General transfer comment: Did not attempt this session per pt request.   Ambulation/Gait                Stairs            Wheelchair Mobility    Modified Rankin (Stroke Patients Only)       Balance Overall balance assessment: (Not able to assess this session. )                                           Pertinent Vitals/Pain Pain Assessment: No/denies pain    Home Living Family/patient expects to be discharged to:: Skilled nursing facility                 Additional Comments: Long term care resident at [facility] per chart review.     Prior Function Level of Independence: Needs assistance         Comments: Per chart review, pt is non-ambulatory and  utilizes a wheelchair for mobility. He is able to transfer himself to/from a toilet and perform some peri-care at baseline.      Hand Dominance        Extremity/Trunk Assessment   Upper Extremity Assessment Upper Extremity Assessment: Defer to OT evaluation    Lower Extremity Assessment Lower Extremity Assessment: RLE deficits/detail;LLE deficits/detail RLE Deficits / Details: Knee flexion contracture, at least ~25, and tendency to keep LE externally rotated while in supine. Unable to fully assess but did note some minor active movement while supine.  LLE Deficits / Details: draining heel wound; knee flexion contracture appears less on L side but still present. Minimal active movement noted.     Cervical / Trunk  Assessment Cervical / Trunk Assessment: (Unable to assess)  Communication   Communication: Expressive difficulties(Elective mutism)  Cognition Arousal/Alertness: Awake/alert Behavior During Therapy: Flat affect Overall Cognitive Status: History of cognitive impairments - at baseline                                 General Comments: Per chart review, history of cognitive delays      General Comments      Exercises     Assessment/Plan    PT Assessment Patient needs continued PT services  PT Problem List Decreased strength;Decreased range of motion;Decreased activity tolerance;Decreased balance;Decreased mobility;Decreased knowledge of use of DME;Decreased safety awareness;Decreased knowledge of precautions       PT Treatment Interventions DME instruction;Functional mobility training;Gait training;Therapeutic activities;Therapeutic exercise;Neuromuscular re-education;Patient/family education;Wheelchair mobility training    PT Goals (Current goals can be found in the Care Plan section)  Acute Rehab PT Goals Patient Stated Goal: Pt did not state goals this session PT Goal Formulation: With patient Time For Goal Achievement: 04/26/19 Potential to Achieve Goals: Fair    Frequency Min 2X/week   Barriers to discharge        Co-evaluation PT/OT/SLP Co-Evaluation/Treatment: Yes Reason for Co-Treatment: Complexity of the patient's impairments (multi-system involvement);Necessary to address cognition/behavior during functional activity;For patient/therapist safety;To address functional/ADL transfers PT goals addressed during session: Mobility/safety with mobility;Strengthening/ROM         AM-PAC PT "6 Clicks" Mobility  Outcome Measure Help needed turning from your back to your side while in a flat bed without using bedrails?: A Little Help needed moving from lying on your back to sitting on the side of a flat bed without using bedrails?: A Lot Help needed moving  to and from a bed to a chair (including a wheelchair)?: A Lot Help needed standing up from a chair using your arms (e.g., wheelchair or bedside chair)?: Total Help needed to walk in hospital room?: Total Help needed climbing 3-5 steps with a railing? : Total 6 Click Score: 10    End of Session Equipment Utilized During Treatment: Other (comment)(Prevalon boots present in room and applied at end of session) Activity Tolerance: Patient tolerated treatment well Patient left: in bed;with call bell/phone within reach;with nursing/sitter in room Nurse Communication: Mobility status;Need for lift equipment PT Visit Diagnosis: Difficulty in walking, not elsewhere classified (R26.2);Muscle weakness (generalized) (M62.81)    Time: [id number] PT Time Calculation (min) (ACUTE ONLY): 26 min   Charges:   PT Evaluation $PT Eval Moderate Complexity: 1 Mod          Rolinda Roan, PT, DPT Acute Rehabilitation Services Pager: [phone] Office: [phone]   Thelma Comp 04/12/2019, 11:33 AM

## 2019-04-12 NOTE — Plan of Care (Signed)
  Problem: Health Behavior/Discharge Planning: Goal: Ability to manage health-related needs will improve Outcome: Progressing   

## 2019-04-12 NOTE — Progress Notes (Signed)
          Daily Rounding Note  04/12/2019, 10:29 AM  LOS: 0 days   SUBJECTIVE:   Chief complaint: Anemia. Large, brown stools, once yesterday and again today. Denies abdominal pain, nausea, leg/foot pain.  OBJECTIVE:         Vital signs in last 24 hours:    Temp:  [97.5 F (36.4 C)-99.3 F (37.4 C)] 99.3 F (37.4 C) (08/09 0500) Pulse Rate:  [63-86] 63 (08/09 0500) Resp:  [20] 20 (08/09 0500) BP: (110-121)/(56-57) 110/56 (08/09 0500) SpO2:  [97 %-100 %] 97 % (08/09 0500) Weight:  [73.4 kg] 73.4 kg (08/09 0500) Last BM Date: 04/12/19 Filed Weights   04/09/2019 0809 04/11/19 0304 04/12/19 0500  Weight: 77 kg 71.4 kg 73.4 kg   General: Mute but nods appropriately to questions.  Does not look toxic or acutely ill Heart: RRR. Chest: Clear bilaterally. Abdomen: Soft, not tender or distended.  No mass, HSM. Extremities: Feet in protective booties. Neuro/Psych: Mute but nods appropriately to questions.  Moves arms and trunk easily, legs not so much.  Intake/Output from previous day: 08/08 0701 - 08/09 0700 In: 1361.1 [P.O.:480; I.V.:727.6; IV Piggyback:153.4] Out: 1300 [Urine:1300]  Intake/Output this shift: No intake/output data recorded.  Lab Results: Recent Labs    05/03/2019 0934 04/27/2019 1453 04/11/19 0409 04/12/19 0420  WBC 13.0*  --  11.2* 11.8*  HGB 7.8* 6.9* 10.0* 9.8*  HCT 25.4* 21.9* 30.8* 29.6*  PLT 491*  --  398 393   BMET Recent Labs    04/29/2019 0934 04/11/19 0409 04/12/19 0420  NA 139 136 139  K 4.0 4.1 3.5  CL 108 107 103  CO2 18* 13* 21*  GLUCOSE 111* 467* 339*  BUN 46* 41* 34*  CREATININE 2.16* 2.28* 2.39*  CALCIUM 9.2 8.3* 8.4*    Studies/Results: No results found.  ASSESMENT:   *  Anemia.  Chronic, longstnding anemia of chronic dz.  Low iron, TIBC, iron sats, folate.  Ferritin, b12 above normal.    hgb 6.9 >> 2 PRBC >> 10 >> 9.8; so stable.  FOBT negative.    Renal doc in [place]  treated with feraheme x 2 in early 03/2019  *Gangrenous foot ulcer. IV vancomycin,Zosyn currently. Not a candidate for revascularization, AKA recommended.   * Hx recurrent UTI. Suprapubic catheter in placefor hxurinary stricture.   *Severe autism with mutism. Communicates with nods.   *   Previous PEG 2013, since removed and pt tolerating carb mod diet.    *   IDDM.  Glucose in 300s to 400s.  Wonder if these will improve once leg is amputated and infection eliminated?      PLAN   *   No pressing need for inpt colonoscopy, EGD.  This can be done by Dr Barney Drain or Dr Gala Romney as outpt.   GI signing off.      Azucena Freed  04/12/2019, 10:29 AM Phone [phone]

## 2019-04-12 NOTE — Progress Notes (Signed)
PROGRESS NOTE    [name]  [id number] DOB: 04/29/1961 DOA: 04/18/2019 PCP: Rosita Fire, MD    Brief Narrative:  HPI per Dr. Nonie Hoyer [name] is a 58 y.o. male with medical history significant of severe autism with elective mutism, schizoaffective disorder, diabetes mellitus type 2, chronic kidney disease, chronic suprapubic catheter for urinary strictures, who presented for CT angiogram of the left leg with Dr. Bobette Mo for nonhealing ulcer.  However, patient was found to have hemoglobin 7.8 for which the procedure was canceled.  Social worker is present at bedside and provides history as the patient is unable to provide his own.  At baseline patient is wheelchair-bound.  The wound first appeared a couple months ago.  They were not able to get into the wound care center until June 23.  He underwent surgical debridement on June 25.  During that time he was noted to have dropping hemoglobin for which he was referred to Dr. Lowanda Foster of nephrology.  Hemoglobin noted to drop to 6.8, and Dr. Lowanda Foster started him on iron infusions.  His first infusion given on July 7.  Subsequently, patient was admitted to [facility] on the 9th for a urinary tract infection, and on July 10 was transfused 2 units of blood as his hemoglobin was down to 6.3.  Stool guaiacs were reported to be negative at that time.  Patient received the second iron fusion on the 30th, but had not been formally seen by Dr. Lowanda Foster.   Assessment & Plan:   Principal Problem:   Anemia, blood loss Active Problems:   Intellectual disability   Acute kidney injury superimposed on CKD (HCC)   Chronic indwelling Foley catheter   Diabetes mellitus out of control (HCC)   Leukocytosis   History of seizure   Gangrene ([place])   Pressure injury of skin   Folate deficiency   Gas gangrene of foot (HCC)   1 iron deficiency anemia/anemia of chronic disease/folate deficiency Patient noted to have a hemoglobin of 7.8  with low MCV.  Patient also noted to have an elevated RDW and concern for iron deficiency anemia.  Patient noted to have been started on iron transfusions per nephrology in the outpatient setting.  Patient noted to have prior reports of guaiac negative stools when evaluated.  Patient felt not to be actively bleeding at this time.  FOBT done was negative.  Repeat hemoglobin done noted patient to have a hemoglobin of 6.9.  Patient transfused 2 units packed red blood cells hemoglobin currently stable at 9.8.  Patient status post IV Feraheme on 04/11/2019.  Continue PPI.  Anemia panel consistent with anemia of chronic disease/iron deficiency anemia.  Folate levels are low at 3.8.  Vitamin B12 levels elevated at 1214.  Continue folic acid 1 mg daily.  Patient seen in consultation by gastroenterology who suspected patient does have a iron deficiency anemia with elevated ferritin secondary to acute phase reactant with superimposed folate deficiency.  Per GI due to lack of overt GI blood loss, negative FOBT,'s stable H&H no further inpatient work-up needed at this time and if further endoscopic work-up is needed can be done in the outpatient setting.  Follow H&H.  Appreciate GIs input and recommendations.  2.  Left heel gangrene  Patient was admitted in the process of undergoing arteriogram on day of admission with vascular surgery which was canceled due to patient's anemia.  WBC noted to be elevated at 13.  Patient afebrile.  Patient initially started on ertapenem IV in  the outpatient setting.  Patient seen by vascular surgery who suggested patient likely needs above-the-knee amputation.  Patient has been pancultured results pending with no growth to date.  Continue IV Zosyn and IV vancomycin.  Patient being followed by vascular surgery who recommended AKA next week.  Per vascular surgery.  3.  Acute kidney injury superimposed on chronic  kidney disease stage III/chronicsuprapubic catheter Unclear baseline.  Urinalysis  ordered but not done.  Repeat urinalysis ordered but not done.  Patient with a urine output of 1.3 L over the past 24 hours.  Urine electrolytes ordered and pending.  Creatinine seems to be stabilizing.  Follow.   4.  Hypertension Continue amlodipine and hydralazine.  5.  Diabetes mellitus type 2 well controlled Patient reported to be a brittle diabetic.  Hemoglobin A1c was 6.7.  CBG of 124 this morning.  Continue to hold oral hypoglycemic agents.  Continue current regimen of Levemir and sliding scale insulin.  6.  Schizoaffective disorder Continue Geodon, Zoloft, Remeron.  7.  Autism/intellectual disability Patient autistic at baseline and electively mute.  Patient nods his head or shakes his head to answer yes or no.  Patient noted to be able to write.  8.  History of seizure disorder No seizures noted.  Continue current regimen of Keppra.   9.  Metabolic acidosis Patient noted to be acidotic with a bicarb of 13.  Likely secondary to left gangrene is healed.  Continue bicarb drip and empiric IV antibiotics.  Repeat labs in the AM.   DVT prophylaxis: SCDs Code Status: Full Family Communication: No family at bedside. Disposition Plan: Patient with history of autism/intellectual disability, given history of anemia patient with high risk for decompensation and therefore will need to stay in house for planned amputation next week for gangrenous left heel for close monitoring.  Discharge when okay with vascular surgery.    Consultants:   Gastroenterology: Dr. Bryan Lemma 04/27/2019  Vascular surgery Scot Dock 04/18/2019  Procedures:   Transfused 2 units packed red blood cells 04/07/2019  Antimicrobials:   IV Zosyn 04/21/2019  IV vancomycin 04/18/2019   Subjective: Patient with mutism, patient answers yes or no to questions by nodding and shaking his head.  Patient denies any chest pain, no shortness of breath, no overt bleeding.   Objective: Vitals:   04/11/19 0541 04/11/19 1343  04/11/19 2127 04/12/19 0500  BP: (!) 105/52 (!) 121/56 (!) 119/57 (!) 110/56  Pulse: 85 86 74 63  Resp: 20   20  Temp: 98 F (36.7 C) (!) 97.5 F (36.4 C) 98.9 F (37.2 C) 99.3 F (37.4 C)  TempSrc: Oral Oral Oral Oral  SpO2: 96% 100% 100% 97%  Weight:    73.4 kg  Height:        Intake/Output Summary (Last 24 hours) at 04/12/2019 1131 Last data filed at 04/12/2019 0400 Gross per 24 hour  Intake 1241.05 ml  Output 1300 ml  Net -58.95 ml   Filed Weights   05/04/2019 0809 04/11/19 0304 04/12/19 0500  Weight: 77 kg 71.4 kg 73.4 kg    Examination:  General exam: NAD Respiratory system: CTAB anterior lung fields.  No wheezes, no crackles, no rhonchi.  Respiratory effort normal. Cardiovascular system: Regular rate rhythm no murmurs rubs or gallops.  No lower extremity edema.  No JVD.   Gastrointestinal system: Abdomen is soft, nontender, nondistended, positive bowel sounds.  No rebound.  No guarding.  Central nervous system: Alert and oriented. No focal neurological deficits. Extremities: Contractures of lower extremities.  Left heel wrapped in bandage.  Lower extremities and heel floaters. Skin: Left heel wrapped in bandage.   Psychiatry: Judgement and insight unable to assess as patient with mutism. Mood & affect appropriate.     Data Reviewed: I have personally reviewed following labs and imaging studies  CBC: Recent Labs  Lab 04/14/2019 0934 04/09/2019 1453 04/11/19 0409 04/12/19 0420  WBC 13.0*  --  11.2* 11.8*  NEUTROABS  --   --   --  8.4*  HGB 7.8* 6.9* 10.0* 9.8*  HCT 25.4* 21.9* 30.8* 29.6*  MCV 80.4  --  81.1 78.9*  PLT 491*  --  398 [id number]   Basic Metabolic Panel: Recent Labs  Lab 04/09/2019 0934 04/11/19 0409 04/12/19 0420  NA 139 136 139  K 4.0 4.1 3.5  CL 108 107 103  CO2 18* 13* 21*  GLUCOSE 111* 467* 339*  BUN 46* 41* 34*  CREATININE 2.16* 2.28* 2.39*  CALCIUM 9.2 8.3* 8.4*   GFR: Estimated Creatinine Clearance: 35.2 mL/min (A) (by C-G formula based  on SCr of 2.39 mg/dL (H)). Liver Function Tests: No results for input(s): AST, ALT, ALKPHOS, BILITOT, PROT, ALBUMIN in the last 168 hours. No results for input(s): LIPASE, AMYLASE in the last 168 hours. No results for input(s): AMMONIA in the last 168 hours. Coagulation Profile: No results for input(s): INR, PROTIME in the last 168 hours. Cardiac Enzymes: No results for input(s): CKTOTAL, CKMB, CKMBINDEX, TROPONINI in the last 168 hours. BNP (last 3 results) No results for input(s): PROBNP in the last 8760 hours. HbA1C: Recent Labs    04/11/19 0409  HGBA1C 6.7*   CBG: Recent Labs  Lab 04/11/2019 0818 04/25/2019 1211 04/17/2019 1818 04/12/19 1115  GLUCAP 87 108* 170* 124*   Lipid Profile: No results for input(s): CHOL, HDL, LDLCALC, TRIG, CHOLHDL, LDLDIRECT in the last 72 hours. Thyroid Function Tests: No results for input(s): TSH, T4TOTAL, FREET4, T3FREE, THYROIDAB in the last 72 hours. Anemia Panel: Recent Labs    05/02/2019 1756  VITAMINB12 1,214*  FOLATE 3.8*  FERRITIN 1,671*  TIBC 162*  IRON 13*  RETICCTPCT 1.5   Sepsis Labs: No results for input(s): PROCALCITON, LATICACIDVEN in the last 168 hours.  Recent Results (from the past 240 hour(s))  SARS Coronavirus 2 [facility] order, Performed in [facility] hospital lab) Nasopharyngeal Nasopharyngeal Swab     Status: None   Collection Time: 04/09/2019  8:30 AM   Specimen: Nasopharyngeal Swab  Result Value Ref Range Status   SARS Coronavirus 2 NEGATIVE NEGATIVE Final    Comment: (NOTE) If result is NEGATIVE SARS-CoV-2 target nucleic acids are NOT DETECTED. The SARS-CoV-2 RNA is generally detectable in upper and lower  respiratory specimens during the acute phase of infection. The lowest  concentration of SARS-CoV-2 viral copies this assay can detect is 250  copies / mL. A negative result does not preclude SARS-CoV-2 infection  and should not be used as the sole basis for treatment or other  patient management decisions.   A negative result may occur with  improper specimen collection / handling, submission of specimen other  than nasopharyngeal swab, presence of viral mutation(s) within the  areas targeted by this assay, and inadequate number of viral copies  (<250 copies / mL). A negative result must be combined with clinical  observations, patient history, and epidemiological information. If result is POSITIVE SARS-CoV-2 target nucleic acids are DETECTED. The SARS-CoV-2 RNA is generally detectable in upper and lower  respiratory specimens dur ing the acute phase  of infection.  Positive  results are indicative of active infection with SARS-CoV-2.  Clinical  correlation with patient history and other diagnostic information is  necessary to determine patient infection status.  Positive results do  not rule out bacterial infection or co-infection with other viruses. If result is PRESUMPTIVE POSTIVE SARS-CoV-2 nucleic acids MAY BE PRESENT.   A presumptive positive result was obtained on the submitted specimen  and confirmed on repeat testing.  While 2019 novel coronavirus  (SARS-CoV-2) nucleic acids may be present in the submitted sample  additional confirmatory testing may be necessary for epidemiological  and / or clinical management purposes  to differentiate between  SARS-CoV-2 and other Sarbecovirus currently known to infect humans.  If clinically indicated additional testing with an alternate test  methodology [phone]) is advised. The SARS-CoV-2 RNA is generally  detectable in upper and lower respiratory sp ecimens during the acute  phase of infection. The expected result is Negative. Fact Sheet for Patients:  StrictlyIdeas.no Fact Sheet for Healthcare Providers: BankingDealers.co.za This test is not yet approved or cleared by the [place] FDA and has been authorized for detection and/or diagnosis of SARS-CoV-2 by FDA under an Emergency Use  Authorization (EUA).  This EUA will remain in effect (meaning this test can be used) for the duration of the COVID-19 declaration under Section 564(b)(1) of the Act, 21 U.S.C. section 360bbb-3(b)(1), unless the authorization is terminated or revoked sooner. Performed at [facility], [place] [address] [place], [place] [address]   Culture, blood (routine x 2)     Status: None (Preliminary result)   Collection Time: 04/13/2019  2:30 PM   Specimen: BLOOD RIGHT ARM  Result Value Ref Range Status   Specimen Description BLOOD RIGHT ARM  Final   Special Requests   Final    BOTTLES DRAWN AEROBIC AND ANAEROBIC Blood Culture adequate volume   Culture   Final    NO GROWTH 2 DAYS Performed at [facility], [place] [address] [place], [place] [address]    Report Status PENDING  Incomplete  Culture, blood (routine x 2)     Status: None (Preliminary result)   Collection Time: 04/17/2019  3:02 PM   Specimen: BLOOD RIGHT ARM  Result Value Ref Range Status   Specimen Description BLOOD RIGHT ARM  Final   Special Requests AEROBIC BOTTLE ONLY Blood Culture adequate volume  Final   Culture   Final    NO GROWTH 2 DAYS Performed at [facility], [place] [address] [place], [place] [address]    Report Status PENDING  Incomplete  MRSA PCR Screening     Status: None   Collection Time: 04/11/19  5:00 AM   Specimen: Nasopharyngeal  Result Value Ref Range Status   MRSA by PCR NEGATIVE NEGATIVE Final    Comment:        The GeneXpert MRSA Assay (FDA approved for NASAL specimens only), is one component of a comprehensive MRSA colonization surveillance program. It is not intended to diagnose MRSA infection nor to guide or monitor treatment for MRSA infections. Performed at [facility], [place] [address], [place], [place] [address]          Radiology Studies: No results found.      Scheduled Meds: . sodium chloride   Intravenous Once  . amLODipine  10 mg Oral Daily  . folic  acid  1 mg Oral Daily  . hydrALAZINE  25 mg Oral TID  . insulin aspart  0-9 Units Subcutaneous TID WC  . insulin aspart  3 Units Subcutaneous TID WC  . insulin detemir  10 Units Subcutaneous Daily  . levETIRAcetam  250 mg Oral BID  . mirtazapine  15 mg Oral QHS  . pantoprazole  40 mg Oral Q0600  . potassium chloride  20 mEq Oral Once  . sertraline  200 mg Oral Daily  . sodium chloride flush  3 mL Intravenous Q12H  . ziprasidone  20 mg Oral Daily  . ziprasidone  40 mg Oral QHS   Continuous Infusions: . piperacillin-tazobactam (ZOSYN)  IV 3.375 g (04/12/19 1053)  .  sodium bicarbonate  infusion 1000 mL 75 mL/hr at 04/12/19 0400  . vancomycin Stopped (04/11/19 2148)     LOS: 0 days    Time spent: 40 minutes    Irine Seal, MD [facility]  If 7PM-7AM, please contact night-coverage [email or web address] 04/12/2019, 11:31 AM

## 2019-04-13 ENCOUNTER — Inpatient Hospital Stay (HOSPITAL_COMMUNITY): Payer: Medicaid Other

## 2019-04-13 LAB — CBC WITH DIFFERENTIAL/PLATELET
Abs Immature Granulocytes: 0.07 10*3/uL (ref 0.00–0.07)
Basophils Absolute: 0.1 10*3/uL (ref 0.0–0.1)
Basophils Relative: 0 %
Eosinophils Absolute: 0.9 10*3/uL — ABNORMAL HIGH (ref 0.0–0.5)
Eosinophils Relative: 6 %
HCT: 31.4 % — ABNORMAL LOW (ref 39.0–52.0)
Hemoglobin: 10.4 g/dL — ABNORMAL LOW (ref 13.0–17.0)
Immature Granulocytes: 1 %
Lymphocytes Relative: 15 %
Lymphs Abs: 2 10*3/uL (ref 0.7–4.0)
MCH: 26.1 pg (ref 26.0–34.0)
MCHC: 33.1 g/dL (ref 30.0–36.0)
MCV: 78.9 fL — ABNORMAL LOW (ref 80.0–100.0)
Monocytes Absolute: 0.9 10*3/uL (ref 0.1–1.0)
Monocytes Relative: 7 %
Neutro Abs: 9.8 10*3/uL — ABNORMAL HIGH (ref 1.7–7.7)
Neutrophils Relative %: 71 %
Platelets: 371 10*3/uL (ref 150–400)
RBC: 3.98 MIL/uL — ABNORMAL LOW (ref 4.22–5.81)
RDW: 17.4 % — ABNORMAL HIGH (ref 11.5–15.5)
WBC: 13.7 10*3/uL — ABNORMAL HIGH (ref 4.0–10.5)
nRBC: 0 % (ref 0.0–0.2)

## 2019-04-13 LAB — BASIC METABOLIC PANEL
Anion gap: 12 (ref 5–15)
BUN: 24 mg/dL — ABNORMAL HIGH (ref 6–20)
CO2: 27 mmol/L (ref 22–32)
Calcium: 8 mg/dL — ABNORMAL LOW (ref 8.9–10.3)
Chloride: 100 mmol/L (ref 98–111)
Creatinine, Ser: 1.98 mg/dL — ABNORMAL HIGH (ref 0.61–1.24)
GFR calc Af Amer: 42 mL/min — ABNORMAL LOW (ref >60)
GFR calc non Af Amer: 36 mL/min — ABNORMAL LOW (ref >60)
Glucose, Bld: 128 mg/dL — ABNORMAL HIGH (ref 70–99)
Potassium: 3 mmol/L — ABNORMAL LOW (ref 3.5–5.1)
Sodium: 139 mmol/L (ref 135–145)

## 2019-04-13 LAB — GLUCOSE, CAPILLARY
Glucose-Capillary: 98 mg/dL (ref 70–99)
Glucose-Capillary: 212 mg/dL — ABNORMAL HIGH (ref 70–99)
Glucose-Capillary: 123 mg/dL — ABNORMAL HIGH (ref 70–99)

## 2019-04-13 LAB — MAGNESIUM: Magnesium: 1.7 mg/dL (ref 1.7–2.4)

## 2019-04-13 LAB — SOLUBLE TRANSFERRIN RECEPTOR: Transferrin Receptor: 20.7 nmol/L (ref 12.2–27.3)

## 2019-04-13 MED ORDER — MAGNESIUM SULFATE 2 GM/50ML IV SOLN
2.0000 g | Freq: Once | INTRAVENOUS | Status: AC
  Administered 2019-04-13: 23:00:00 2 g via INTRAVENOUS
  Filled 2019-04-13: qty 50

## 2019-04-13 MED ORDER — DEXTROSE 50 % IV SOLN
INTRAVENOUS | Status: AC
  Filled 2019-04-13: qty 50

## 2019-04-13 MED ORDER — POTASSIUM CHLORIDE CRYS ER 20 MEQ PO TBCR
40.0000 meq | EXTENDED_RELEASE_TABLET | ORAL | Status: AC
  Administered 2019-04-13 (×2): 40 meq via ORAL
  Filled 2019-04-13 (×2): qty 2

## 2019-04-13 MED ORDER — SODIUM BICARBONATE-DEXTROSE 150-5 MEQ/L-% IV SOLN
150.0000 meq | INTRAVENOUS | Status: DC
  Administered 2019-04-13: 150 meq via INTRAVENOUS
  Filled 2019-04-13 (×4): qty 1000

## 2019-04-13 NOTE — Progress Notes (Signed)
Pharmacy Antibiotic Note  [name] is a 58 y.o. male admitted on 05/03/2019 with gangrenous wound infection.  Pharmacy has been consulted for Vanco/Zosyn dosing. Plans note for BKA on 8/13.  -WBC= 13.7, afeb, SCr= 1.98, cultures- ngtd   Vanco 8/7>> Zosyn 8/7>>  Vancomycin  1000 mg IV Q 24 hrs. Goal AUC 400-550. Expected AUC: 491 SCr used: 2.16   Plan: -Zosyn 3.375g IV q 8hr. -Vancomycin 1g IV q 24h. -Will follow renal function, cultures and clinical progress   Height: 5\' 10"  (177.8 cm) Weight: 161 lb 13.1 oz (73.4 kg) IBW/kg (Calculated) : 73  Temp (24hrs), Avg:98 F (36.7 C), Min:97.4 F (36.3 C), Max:98.5 F (36.9 C)  Recent Labs  Lab 04/09/2019 0934 04/11/19 0409 04/12/19 0420 04/13/19 0548  WBC 13.0* 11.2* 11.8* 13.7*  CREATININE 2.16* 2.28* 2.39* 1.98*    Estimated Creatinine Clearance: 42.5 mL/min (A) (by C-G formula based on SCr of 1.98 mg/dL (H)).    No Known Allergies  Hildred Laser, PharmD Clinical Pharmacist **Pharmacist phone directory can now be found on [email or web address] ([email or web address]).  Listed under [place].

## 2019-04-13 NOTE — Progress Notes (Signed)
Upon entering pts room this morning, noted that pt was distraught, questioned pt if he was in pain or sad, pt motioned to his foot and made cutting gesture, he then began to cry, he was unable to express anything verbally, but used gestures and nods to communicate that he was very scared, I was later called by the vascular PA who stated she had explained to him that he would be scheduled for an amputation of his left leg on Thursday, the pt has been visibly sad and withdrawn throughout the day.

## 2019-04-13 NOTE — Progress Notes (Addendum)
  Progress Note    04/13/2019 8:34 AM Hospital Day 3  Subjective:  Doesn't speak, but nods to questions.   afebrile  Vitals:   04/12/19 2129 04/13/19 0507  BP: 119/65 123/72  Pulse: 71 70  Resp: 20 20  Temp: 98 F (36.7 C) (!) 97.4 F (36.3 C)  SpO2: 100% 100%    Physical Exam: General:  Resting comfortably in no distress.  CBC    Component Value Date/Time   WBC 13.7 (H) 04/13/2019 0548   RBC 3.98 (L) 04/13/2019 0548   HGB 10.4 (L) 04/13/2019 0548   HCT 31.4 (L) 04/13/2019 0548   PLT 371 04/13/2019 0548   MCV 78.9 (L) 04/13/2019 0548   MCH 26.1 04/13/2019 0548   MCHC 33.1 04/13/2019 0548   RDW 17.4 (H) 04/13/2019 0548   LYMPHSABS 2.0 04/13/2019 0548   MONOABS 0.9 04/13/2019 0548   EOSABS 0.9 (H) 04/13/2019 0548   BASOSABS 0.1 04/13/2019 0548    BMET    Component Value Date/Time   NA 139 04/13/2019 0548   K 3.0 (L) 04/13/2019 0548   CL 100 04/13/2019 0548   CO2 27 04/13/2019 0548   GLUCOSE 128 (H) 04/13/2019 0548   BUN 24 (H) 04/13/2019 0548   CREATININE 1.98 (H) 04/13/2019 0548   CALCIUM 8.0 (L) 04/13/2019 0548   GFRNONAA 36 (L) 04/13/2019 0548   GFRAA 42 (L) 04/13/2019 0548    INR    Component Value Date/Time   INR 1.24 02/16/2012 0425     Intake/Output Summary (Last 24 hours) at 04/13/2019 0834 Last data filed at 04/13/2019 0700 Gross per 24 hour  Intake 3129.94 ml  Output -  Net 3129.94 ml     Assessment/Plan:  58 y.o. male with non healing left heel ulcer  Hospital Day 3  -pt in need of left above knee amputation.  Discussed this with pt and he nodded no.  I told him this could make him very sick.  RN feels pt is scared about procedure.  Dr. Trula Slade will be by tomorrow to see pt again.  Amputation planned for Thursday.    Leontine Locket, PA-C [facility] [phone] 04/13/2019 8:34 AM

## 2019-04-13 NOTE — Progress Notes (Signed)
PROGRESS NOTE    [name]  [id number] DOB: [birth year] DOA: 04/19/2019 PCP: Rosita Fire, MD    Brief Narrative:  HPI per Dr. Nonie Hoyer [name] is a 58 y.o. male with medical history significant of severe autism with elective mutism, schizoaffective disorder, diabetes mellitus type 2, chronic kidney disease, chronic suprapubic catheter for urinary strictures, who presented for CT angiogram of the left leg with Dr. Bobette Mo for nonhealing ulcer.  However, patient was found to have hemoglobin 7.8 for which the procedure was canceled.  Social worker is present at bedside and provides history as the patient is unable to provide his own.  At baseline patient is wheelchair-bound.  The wound first appeared a couple months ago.  They were not able to get into the wound care center until June 23.  He underwent surgical debridement on June 25.  During that time he was noted to have dropping hemoglobin for which he was referred to Dr. Lowanda Foster of nephrology.  Hemoglobin noted to drop to 6.8, and Dr. Lowanda Foster started him on iron infusions.  His first infusion given on July 7.  Subsequently, patient was admitted to [facility] on the 9th for a urinary tract infection, and on July 10 was transfused 2 units of blood as his hemoglobin was down to 6.3.  Stool guaiacs were reported to be negative at that time.  Patient received the second iron fusion on the 30th, but had not been formally seen by Dr. Lowanda Foster.   Assessment & Plan:   Principal Problem:   Anemia, blood loss Active Problems:   Intellectual disability   Acute kidney injury superimposed on CKD (HCC)   Chronic indwelling Foley catheter   Diabetes mellitus out of control (HCC)   Leukocytosis   History of seizure   Gangrene ([name])   Pressure injury of skin   Folate deficiency   Gas gangrene of foot (HCC)   1 iron deficiency anemia/anemia of chronic disease/folate deficiency Patient noted to have a hemoglobin of 7.8  with low MCV.  Patient also noted to have an elevated RDW and concern for iron deficiency anemia.  Patient noted to have been started on iron transfusions per nephrology in the outpatient setting.  Patient noted to have prior reports of guaiac negative stools when evaluated.  Patient felt not to be actively bleeding at this time.  FOBT done was negative.  Repeat hemoglobin done noted patient to have a hemoglobin of 6.9.  Patient transfused 2 units packed red blood cells hemoglobin currently stable at 10.4.  Patient status post IV Feraheme on 04/11/2019.  Continue PPI.  Anemia panel consistent with anemia of chronic disease/iron deficiency anemia.  Folate levels are low at 3.8.  Vitamin B12 levels elevated at 1214.  Continue folic acid 1 mg daily.  Patient seen in consultation by gastroenterology who suspected patient does have a iron deficiency anemia with elevated ferritin secondary to acute phase reactant with superimposed folate deficiency.  Per GI due to lack of overt GI blood loss, negative FOBT,'s stable H&H no further inpatient work-up needed at this time and if further endoscopic work-up is needed can be done in the outpatient setting.  Follow H&H.  Appreciate GIs input and recommendations.  2.  Left heel gangrene  Patient was admitted in the process of undergoing arteriogram on day of admission with vascular surgery which was canceled due to patient's anemia.  WBC noted to be elevated at 13.  Patient afebrile.  Patient initially started on ertapenem IV in  the outpatient setting.  Patient seen by vascular surgery who suggested patient likely needs above-the-knee amputation.  Patient has been pancultured results pending with no growth to date.  Continue IV Zosyn and IV vancomycin.  Patient being followed by vascular surgery who recommended AKA next week.  Patient seems somewhat apprehensive.  Per vascular surgery.  3.  Acute kidney injury superimposed on chronic  kidney disease stage III/chronicsuprapubic  catheter Unclear baseline.  Urinalysis ordered but not done.  Repeat urinalysis ordered but not done.  Patient with a urine output not recorded. Urine electrolytes ordered and pending.  Creatinine seems to be stabilizing.  Follow.   4.  Hypertension Continue amlodipine and hydralazine.  5.  Diabetes mellitus type 2 well controlled Patient reported to be a brittle diabetic.  Hemoglobin A1c was 6.7.  CBG of 98 this morning.  Continue to hold oral hypoglycemic agents.  Continue current regimen of Levemir and sliding scale insulin.  Change CBGs to [facility] and at bedtime.  6.  Schizoaffective disorder Stable.  Continue Zoloft, Geodon, Remeron.  7.  Autism/intellectual disability Patient autistic at baseline and electively mute.  Patient nods his head or shakes his head to answer yes or no.  Patient noted to be able to write.  8.  History of seizure disorder No seizures noted.  Continue current regimen of Keppra.   9.  Metabolic acidosis Patient noted to be acidotic with a bicarb of 13.  Likely secondary to left gangrene is healed.  Continue bicarb drip and empiric IV antibiotics.  Repeat labs in the AM.  Will likely discontinue bicarb drip tomorrow if acidosis continues to be improved.  10.  Hypo-kalemia Check a magnesium level.  K. Dur 40 mEq p.o. every 4 hours x2 doses.  Repeat labs in the morning.  11.  Leukocytosis Likely secondary to gangrene heel..  Patient afebrile.  Check a chest x-ray.  Check a UA with cultures and sensitivities.  Patient empirically on IV vancomycin IV Zosyn which we will continue for now.   DVT prophylaxis: SCDs Code Status: Full Family Communication: No family at bedside. Disposition Plan: Patient with history of autism/intellectual disability, given history of anemia patient with high risk for decompensation and therefore will need to stay in house for planned amputation next week for gangrenous left heel for close monitoring.  Discharge when okay with vascular  surgery.    Consultants:   Gastroenterology: Dr. Bryan Lemma 04/14/2019  Vascular surgery Scot Dock 04/13/2019  Procedures:   Transfused 2 units packed red blood cells 04/15/2019  Antimicrobials:   IV Zosyn 04/26/2019  IV vancomycin 04/05/2019   Subjective: Patient with mutism, patient answers yes or no to questions by nodding and shaking his head.  Patient denies any chest pain.  No shortness of breath.  No overt bleeding.  Patient seems somewhat apprehensive when told he will likely need an amputation.   Objective: Vitals:   04/12/19 0500 04/12/19 1419 04/12/19 2129 04/13/19 0507  BP: (!) 110/56 (!) 115/51 119/65 123/72  Pulse: 63 70 71 70  Resp: 20  20 20   Temp: 99.3 F (37.4 C) 98.5 F (36.9 C) 98 F (36.7 C) (!) 97.4 F (36.3 C)  TempSrc: Oral Oral Oral Oral  SpO2: 97% 100% 100% 100%  Weight: 73.4 kg     Height:        Intake/Output Summary (Last 24 hours) at 04/13/2019 0958 Last data filed at 04/13/2019 0700 Gross per 24 hour  Intake 2889.94 ml  Output -  Net 2889.94 ml  Filed Weights   05/03/2019 0809 04/11/19 0304 04/12/19 0500  Weight: 77 kg 71.4 kg 73.4 kg    Examination:  General exam: NAD Respiratory system: Lungs are to auscultation bilaterally anterior lung fields.  No wheezes, no crackles, no rhonchi.  Normal respiratory effort.  Cardiovascular system: RRR no murmurs rubs or gallops.  No JVD.  No lower extremity edema.  Gastrointestinal system: Abdomen is soft, nontender, nondistended, positive bowel sounds.  No rebound.  No guarding.  Central nervous system: Alert and oriented. No focal neurological deficits. Extremities: Contractures of lower extremities.  Left heel wrapped in bandage.  Lower extremities in heel floaters. Skin: Left heel wrapped in bandage.   Psychiatry: Judgement and insight unable to assess as patient with mutism. Mood & affect appropriate.     Data Reviewed: I have personally reviewed following labs and imaging studies  CBC:  Recent Labs  Lab 04/04/2019 0934 04/17/2019 1453 04/11/19 0409 04/12/19 0420 04/13/19 0548  WBC 13.0*  --  11.2* 11.8* 13.7*  NEUTROABS  --   --   --  8.4* 9.8*  HGB 7.8* 6.9* 10.0* 9.8* 10.4*  HCT 25.4* 21.9* 30.8* 29.6* 31.4*  MCV 80.4  --  81.1 78.9* 78.9*  PLT 491*  --  398 [id number]   Basic Metabolic Panel: Recent Labs  Lab 05/02/2019 0934 04/11/19 0409 04/12/19 0420 04/13/19 0548  NA 139 136 139 139  K 4.0 4.1 3.5 3.0*  CL 108 107 103 100  CO2 18* 13* 21* 27  GLUCOSE 111* 467* 339* 128*  BUN 46* 41* 34* 24*  CREATININE 2.16* 2.28* 2.39* 1.98*  CALCIUM 9.2 8.3* 8.4* 8.0*   GFR: Estimated Creatinine Clearance: 42.5 mL/min (A) (by C-G formula based on SCr of 1.98 mg/dL (H)). Liver Function Tests: No results for input(s): AST, ALT, ALKPHOS, BILITOT, PROT, ALBUMIN in the last 168 hours. No results for input(s): LIPASE, AMYLASE in the last 168 hours. No results for input(s): AMMONIA in the last 168 hours. Coagulation Profile: No results for input(s): INR, PROTIME in the last 168 hours. Cardiac Enzymes: No results for input(s): CKTOTAL, CKMB, CKMBINDEX, TROPONINI in the last 168 hours. BNP (last 3 results) No results for input(s): PROBNP in the last 8760 hours. HbA1C: Recent Labs    04/11/19 0409  HGBA1C 6.7*   CBG: Recent Labs  Lab 04/12/19 1115 04/12/19 1613 04/12/19 2126 04/12/19 2213 04/13/19 0136  GLUCAP 124* 138* 69* 169* 98   Lipid Profile: No results for input(s): CHOL, HDL, LDLCALC, TRIG, CHOLHDL, LDLDIRECT in the last 72 hours. Thyroid Function Tests: No results for input(s): TSH, T4TOTAL, FREET4, T3FREE, THYROIDAB in the last 72 hours. Anemia Panel: Recent Labs    04/30/2019 1756  VITAMINB12 1,214*  FOLATE 3.8*  FERRITIN 1,671*  TIBC 162*  IRON 13*  RETICCTPCT 1.5   Sepsis Labs: No results for input(s): PROCALCITON, LATICACIDVEN in the last 168 hours.  Recent Results (from the past 240 hour(s))  SARS Coronavirus 2 [facility] order,  Performed in [facility] hospital lab) Nasopharyngeal Nasopharyngeal Swab     Status: None   Collection Time: 04/06/2019  8:30 AM   Specimen: Nasopharyngeal Swab  Result Value Ref Range Status   SARS Coronavirus 2 NEGATIVE NEGATIVE Final    Comment: (NOTE) If result is NEGATIVE SARS-CoV-2 target nucleic acids are NOT DETECTED. The SARS-CoV-2 RNA is generally detectable in upper and lower  respiratory specimens during the acute phase of infection. The lowest  concentration of SARS-CoV-2 viral copies this assay can detect  is 250  copies / mL. A negative result does not preclude SARS-CoV-2 infection  and should not be used as the sole basis for treatment or other  patient management decisions.  A negative result may occur with  improper specimen collection / handling, submission of specimen other  than nasopharyngeal swab, presence of viral mutation(s) within the  areas targeted by this assay, and inadequate number of viral copies  (<250 copies / mL). A negative result must be combined with clinical  observations, patient history, and epidemiological information. If result is POSITIVE SARS-CoV-2 target nucleic acids are DETECTED. The SARS-CoV-2 RNA is generally detectable in upper and lower  respiratory specimens dur ing the acute phase of infection.  Positive  results are indicative of active infection with SARS-CoV-2.  Clinical  correlation with patient history and other diagnostic information is  necessary to determine patient infection status.  Positive results do  not rule out bacterial infection or co-infection with other viruses. If result is PRESUMPTIVE POSTIVE SARS-CoV-2 nucleic acids MAY BE PRESENT.   A presumptive positive result was obtained on the submitted specimen  and confirmed on repeat testing.  While 2019 novel coronavirus  (SARS-CoV-2) nucleic acids may be present in the submitted sample  additional confirmatory testing may be necessary for epidemiological  and / or  clinical management purposes  to differentiate between  SARS-CoV-2 and other Sarbecovirus currently known to infect humans.  If clinically indicated additional testing with an alternate test  methodology [phone]) is advised. The SARS-CoV-2 RNA is generally  detectable in upper and lower respiratory sp ecimens during the acute  phase of infection. The expected result is Negative. Fact Sheet for Patients:  StrictlyIdeas.no Fact Sheet for Healthcare Providers: BankingDealers.co.za This test is not yet approved or cleared by the [place] FDA and has been authorized for detection and/or diagnosis of SARS-CoV-2 by FDA under an Emergency Use Authorization (EUA).  This EUA will remain in effect (meaning this test can be used) for the duration of the COVID-19 declaration under Section 564(b)(1) of the Act, 21 U.S.C. section 360bbb-3(b)(1), unless the authorization is terminated or revoked sooner. Performed at [facility], [place] [address], [place], [place] [address]   Culture, blood (routine x 2)     Status: None (Preliminary result)   Collection Time: 04/27/2019  2:30 PM   Specimen: BLOOD RIGHT ARM  Result Value Ref Range Status   Specimen Description BLOOD RIGHT ARM  Final   Special Requests   Final    BOTTLES DRAWN AEROBIC AND ANAEROBIC Blood Culture adequate volume   Culture   Final    NO GROWTH 2 DAYS Performed at [facility], [place] [address], , [place] [address]    Report Status PENDING  Incomplete  Culture, blood (routine x 2)     Status: None (Preliminary result)   Collection Time: 05/02/2019  3:02 PM   Specimen: BLOOD RIGHT ARM  Result Value Ref Range Status   Specimen Description BLOOD RIGHT ARM  Final   Special Requests AEROBIC BOTTLE ONLY Blood Culture adequate volume  Final   Culture   Final    NO GROWTH 2 DAYS Performed at [facility], [place] [address] [place],  [address]    Report Status  PENDING  Incomplete  MRSA PCR Screening     Status: None   Collection Time: 04/11/19  5:00 AM   Specimen: Nasopharyngeal  Result Value Ref Range Status   MRSA by PCR NEGATIVE NEGATIVE  Final    Comment:        The GeneXpert MRSA Assay (FDA approved for NASAL specimens only), is one component of a comprehensive MRSA colonization surveillance program. It is not intended to diagnose MRSA infection nor to guide or monitor treatment for MRSA infections. Performed at [facility], [place] [address], [place], [place] [address]          Radiology Studies: Dg Chest Port 1 View  Result Date: 04/13/2019 CLINICAL DATA:  Leukocytosis. EXAM: PORTABLE CHEST 1 VIEW COMPARISON:  Chest radiograph and CT 03/12/2019 FINDINGS: The patient is slightly rotated to the right. The cardiomediastinal silhouette is within normal limits. Minimal linear opacity in the left lung base suggests atelectasis. The lungs are otherwise clear. No sizable pleural effusion or pneumothorax is identified. No acute osseous abnormality is seen. IMPRESSION: No active disease. Electronically Signed   By: Logan Bores M.D.   On: 04/13/2019 08:51        Scheduled Meds: . sodium chloride   Intravenous Once  . amLODipine  10 mg Oral Daily  . folic acid  1 mg Oral Daily  . hydrALAZINE  25 mg Oral TID  . insulin aspart  0-9 Units Subcutaneous TID WC  . insulin aspart  3 Units Subcutaneous TID WC  . insulin detemir  10 Units Subcutaneous Daily  . levETIRAcetam  250 mg Oral BID  . mirtazapine  15 mg Oral QHS  . pantoprazole  40 mg Oral Q0600  . potassium chloride  40 mEq Oral Q4H  . sertraline  200 mg Oral Daily  . sodium chloride flush  3 mL Intravenous Q12H  . ziprasidone  20 mg Oral Daily  . ziprasidone  40 mg Oral QHS   Continuous Infusions: . piperacillin-tazobactam (ZOSYN)  IV 3.375 g (04/13/19 0834)  .  sodium bicarbonate  infusion 1000 mL 75 mL/hr at 04/13/19 0833  . vancomycin Stopped (04/12/19 2240)      LOS: 1 day    Time spent: 40 minutes    Irine Seal, MD [facility]  If 7PM-7AM, please contact night-coverage [email or web address] 04/13/2019, 9:58 AM

## 2019-04-13 NOTE — TOC Initial Note (Signed)
Transition of Care [facility]) - Initial/Assessment Note    Patient Details  Name: [name] MRN: [id number] Date of Birth: 12-02-1960  [facility] CM/SW Contact:    Alexander [place], [place] Phone Number: 04/13/2019, 12:31 PM  Clinical Narrative:                 CSW called pt guardian with [place] DSS, Betsey Amen. Introduced self, role, reason for call and provided Melissa with my number. Pt has been a ward of [facility] for the past 7 years, he does not have any family they are aware of but pt does have a close friend that used to work at [employer] that he keeps in contact with. Pt can write in full sentences and guardian had left paperwork by the bed. We discussed current care plan and CSW mentioned that per [facility] notes from this morning that pt has an amputation scheduled for Thursday. This procedure had not been discussed by provider with guardian and guardian is specifically asking for Dr. Scot Dock to follow back up with her in regards to procedure as all of this must be approved by guardian and [facility] DSS. Message left at [facility] requesting a call back.   CSW will continue to follow. All consents must go through guardian Betsey Amen [phone].  Expected Discharge Plan: [place] Barriers to Discharge: Continued Medical Work up   Patient Goals and CMS Choice Patient states their goals for this hospitalization and ongoing recovery are:: for him to return to [facility] [employer].gov Compare Post Acute Care list provided to:: Patient Represenative (must comment)(Melissa Price, legal guardian)    Expected Discharge Plan and Services Expected Discharge Plan: [place] In-house Referral: Clinical Social Work   Post Acute Care Choice: [place] Living arrangements for the past 2 months: [place]                                       Prior Living Arrangements/Services Living arrangements for the past 2 months: [place] Lives with:: Facility Resident Patient language and need for interpreter reviewed:: Yes(no needs) Do you feel safe going back to the place where you live?: Yes      Need for Family Participation in Patient Care: Yes (Comment)(support and assistance with decision making) Care giver support system in place?: Yes (comment)(Melissa Price, guardian)   Criminal Activity/Legal Involvement Pertinent to Current Situation/Hospitalization: No - Comment as needed  Activities of Daily Living      Permission Sought/Granted Permission sought to share information with : Facility Sport and exercise psychologist, Family Supports Permission granted to share information with : No(pt has legal guardian with [place] DSS)  Share Information with NAME: Betsey Amen  Permission granted to share info w AGENCY: [place] granted to share info w Relationship: DSS [place] granted to share info w Contact Information: [phone] ext [phone]  Emotional Assessment Appearance:: Other (Comment Required(telephonic assessment with guardian) Attitude/Demeanor/Rapport: (telephonic assessment with guardian) Affect (typically observed): (telephonic assessment with guardian) Orientation: : Oriented to Self Alcohol / Substance Use: Not Applicable Psych Involvement: Outpatient Provider  Admission diagnosis:  PVD with gang green Patient Active Problem List   Diagnosis Date Noted  . Gas gangrene of foot ([place]) 04/12/2019  . Pressure injury of skin 04/11/2019  . Folate  deficiency   . Anemia, blood loss 04/04/2019  . Leukocytosis 04/13/2019  . History of seizure 04/29/2019  . Gangrene ([place]) 04/19/2019  . Respiratory failure ([place]) 02/08/2012  . Diabetes mellitus out of control ([place]) 02/08/2012  . HCAP (healthcare-associated pneumonia) 10/22/2011  . SIRS (systemic  inflammatory response syndrome) ([place]) 10/22/2011  . Acute kidney injury superimposed on CKD ([place]) 10/22/2011  . Hyperkalemia 10/22/2011  . Anemia 10/22/2011  . Hyperglycemia 10/22/2011  . Abnormal urinalysis 10/22/2011  . Chronic indwelling Foley catheter 10/22/2011  . Type II or unspecified type diabetes mellitus without mention of complication, not stated as uncontrolled 09/19/2011  . Acute renal failure ([place]) 09/19/2011  . Pneumonia 09/19/2011  . Sepsis(995.91) 09/19/2011  . UTI (lower urinary tract infection) 09/19/2011  . Intellectual disability 09/19/2011  . BPH (benign prostatic hyperplasia) 09/19/2011   PCP:  Rosita Fire, MD Pharmacy:  No Pharmacies Listed    Social Determinants of Health (SDOH) Interventions    Readmission Risk Interventions No flowsheet data found.

## 2019-04-13 NOTE — Social Work (Signed)
Pt is a LTC resident at [facility]. He is also a ward of [facility] and his assigned guardian is Betsey Amen [phone] ext [phone]). After hours/emergency contact number is [phone].  CSW continuing to follow for support with disposition when medically appropriate.  Westley Hummer, MSW, [place] Work [phone]

## 2019-04-14 ENCOUNTER — Inpatient Hospital Stay (HOSPITAL_COMMUNITY): Payer: Medicaid Other

## 2019-04-14 DIAGNOSIS — R112 Nausea with vomiting, unspecified: Secondary | ICD-10-CM

## 2019-04-14 LAB — GLUCOSE, CAPILLARY
Glucose-Capillary: 310 mg/dL — ABNORMAL HIGH (ref 70–99)
Glucose-Capillary: 147 mg/dL — ABNORMAL HIGH (ref 70–99)
Glucose-Capillary: 62 mg/dL — ABNORMAL LOW (ref 70–99)
Glucose-Capillary: 88 mg/dL (ref 70–99)
Glucose-Capillary: 48 mg/dL — ABNORMAL LOW (ref 70–99)
Glucose-Capillary: 155 mg/dL — ABNORMAL HIGH (ref 70–99)
Glucose-Capillary: 54 mg/dL — ABNORMAL LOW (ref 70–99)
Glucose-Capillary: 68 mg/dL — ABNORMAL LOW (ref 70–99)
Glucose-Capillary: 117 mg/dL — ABNORMAL HIGH (ref 70–99)

## 2019-04-14 LAB — URINALYSIS, ROUTINE W REFLEX MICROSCOPIC
Bacteria, UA: NONE SEEN
Bilirubin Urine: NEGATIVE
Glucose, UA: 150 mg/dL — AB
Ketones, ur: NEGATIVE mg/dL
Nitrite: NEGATIVE
Protein, ur: 100 mg/dL — AB
RBC / HPF: >50 RBC/hpf — ABNORMAL HIGH (ref 0–5)
Specific Gravity, Urine: 1.014 (ref 1.005–1.030)
WBC, UA: >50 WBC/hpf — ABNORMAL HIGH (ref 0–5)
pH: 7 (ref 5.0–8.0)

## 2019-04-14 LAB — CBC WITH DIFFERENTIAL/PLATELET
Abs Immature Granulocytes: 0.09 10*3/uL — ABNORMAL HIGH (ref 0.00–0.07)
Basophils Absolute: 0.1 10*3/uL (ref 0.0–0.1)
Basophils Relative: 0 %
Eosinophils Absolute: 0.8 10*3/uL — ABNORMAL HIGH (ref 0.0–0.5)
Eosinophils Relative: 6 %
HCT: 31.3 % — ABNORMAL LOW (ref 39.0–52.0)
Hemoglobin: 10 g/dL — ABNORMAL LOW (ref 13.0–17.0)
Immature Granulocytes: 1 %
Lymphocytes Relative: 12 %
Lymphs Abs: 1.6 10*3/uL (ref 0.7–4.0)
MCH: 25.7 pg — ABNORMAL LOW (ref 26.0–34.0)
MCHC: 31.9 g/dL (ref 30.0–36.0)
MCV: 80.5 fL (ref 80.0–100.0)
Monocytes Absolute: 1 10*3/uL (ref 0.1–1.0)
Monocytes Relative: 7 %
Neutro Abs: 9.9 10*3/uL — ABNORMAL HIGH (ref 1.7–7.7)
Neutrophils Relative %: 74 %
Platelets: 330 10*3/uL (ref 150–400)
RBC: 3.89 MIL/uL — ABNORMAL LOW (ref 4.22–5.81)
RDW: 17.4 % — ABNORMAL HIGH (ref 11.5–15.5)
WBC: 13.5 10*3/uL — ABNORMAL HIGH (ref 4.0–10.5)
nRBC: 0 % (ref 0.0–0.2)

## 2019-04-14 LAB — SODIUM, URINE, RANDOM: Sodium, Ur: 84 mmol/L

## 2019-04-14 LAB — BASIC METABOLIC PANEL
Anion gap: 13 (ref 5–15)
BUN: 19 mg/dL (ref 6–20)
CO2: 30 mmol/L (ref 22–32)
Calcium: 7.8 mg/dL — ABNORMAL LOW (ref 8.9–10.3)
Chloride: 97 mmol/L — ABNORMAL LOW (ref 98–111)
Creatinine, Ser: 2.02 mg/dL — ABNORMAL HIGH (ref 0.61–1.24)
GFR calc Af Amer: 41 mL/min — ABNORMAL LOW (ref >60)
GFR calc non Af Amer: 36 mL/min — ABNORMAL LOW (ref >60)
Glucose, Bld: 231 mg/dL — ABNORMAL HIGH (ref 70–99)
Potassium: 3.9 mmol/L (ref 3.5–5.1)
Sodium: 140 mmol/L (ref 135–145)

## 2019-04-14 LAB — SURGICAL PCR SCREEN
MRSA, PCR: NEGATIVE
Staphylococcus aureus: NEGATIVE

## 2019-04-14 LAB — HEPATIC FUNCTION PANEL
ALT: 19 U/L (ref 0–44)
AST: 42 U/L — ABNORMAL HIGH (ref 15–41)
Albumin: 1.7 g/dL — ABNORMAL LOW (ref 3.5–5.0)
Alkaline Phosphatase: 87 U/L (ref 38–126)
Bilirubin, Direct: 0.2 mg/dL (ref 0.0–0.2)
Indirect Bilirubin: 0.7 mg/dL (ref 0.3–0.9)
Total Bilirubin: 0.9 mg/dL (ref 0.3–1.2)
Total Protein: 6.1 g/dL — ABNORMAL LOW (ref 6.5–8.1)

## 2019-04-14 LAB — CREATININE, URINE, RANDOM: Creatinine, Urine: 53.75 mg/dL

## 2019-04-14 LAB — MAGNESIUM: Magnesium: 2.2 mg/dL (ref 1.7–2.4)

## 2019-04-14 MED ORDER — LORAZEPAM 0.5 MG PO TABS
0.5000 mg | ORAL_TABLET | Freq: Two times a day (BID) | ORAL | Status: DC | PRN
  Administered 2019-04-14 – 2019-04-23 (×4): 0.5 mg via ORAL
  Filled 2019-04-14 (×4): qty 1

## 2019-04-14 MED ORDER — MUPIROCIN 2 % EX OINT: 1.0000 "application " | TOPICAL_OINTMENT | Freq: Two times a day (BID) | CUTANEOUS | Status: DC

## 2019-04-14 MED ORDER — DEXTROSE-NACL 5-0.9 % IV SOLN
INTRAVENOUS | Status: DC
  Administered 2019-04-14 (×2): via INTRAVENOUS

## 2019-04-14 MED ORDER — BISACODYL 10 MG RE SUPP
10.0000 mg | Freq: Once | RECTAL | Status: AC
  Administered 2019-04-14: 10 mg via RECTAL
  Filled 2019-04-14: qty 1

## 2019-04-14 MED ORDER — METOCLOPRAMIDE HCL 5 MG/ML IJ SOLN: 5.0000 mg | Freq: Four times a day (QID) | INTRAMUSCULAR | Status: DC

## 2019-04-14 NOTE — Social Work (Signed)
Return message left for [facility] to f/u with pt guardian for any consents for surgery or additional procedures.   Westley Hummer, MSW, [place] Work [phone]

## 2019-04-14 NOTE — Progress Notes (Signed)
PROGRESS NOTE    Katrina Krauser  [id number] DOB: 11/15/1960 DOA: 04/25/2019 PCP: Rosita Fire, MD    Brief Narrative:  HPI per Dr. Nonie Hoyer Clemenson is a 58 y.o. male with medical history significant of severe autism with elective mutism, schizoaffective disorder, diabetes mellitus type 2, chronic kidney disease, chronic suprapubic catheter for urinary strictures, who presented for CT angiogram of the left leg with Dr. Bobette Mo for nonhealing ulcer.  However, patient was found to have hemoglobin 7.8 for which the procedure was canceled.  Social worker is present at bedside and provides history as the patient is unable to provide his own.  At baseline patient is wheelchair-bound.  The wound first appeared a couple months ago.  They were not able to get into the wound care center until June 23.  He underwent surgical debridement on June 25.  During that time he was noted to have dropping hemoglobin for which he was referred to Dr. Lowanda Foster of nephrology.  Hemoglobin noted to drop to 6.8, and Dr. Lowanda Foster started him on iron infusions.  His first infusion given on July 7.  Subsequently, patient was admitted to [facility] on the 9th for a urinary tract infection, and on July 10 was transfused 2 units of blood as his hemoglobin was down to 6.3.  Stool guaiacs were reported to be negative at that time.  Patient received the second iron fusion on the 30th, but had not been formally seen by Dr. Lowanda Foster.   Assessment & Plan:   Principal Problem:   Anemia, blood loss Active Problems:   Intellectual disability   Acute kidney injury superimposed on CKD (HCC)   Chronic indwelling Foley catheter   Diabetes mellitus out of control (HCC)   Leukocytosis   History of seizure   Gangrene ([place])   Pressure injury of skin   Folate deficiency   Gas gangrene of foot (HCC)   1 iron deficiency anemia/anemia of chronic disease/folate deficiency Patient noted to have a hemoglobin of 7.8  with low MCV.  Patient also noted to have an elevated RDW and concern for iron deficiency anemia.  Patient noted to have been started on iron transfusions per nephrology in the outpatient setting.  Patient noted to have prior reports of guaiac negative stools when evaluated.  Patient felt not to be actively bleeding at this time.  FOBT done was negative.  Repeat hemoglobin done noted patient to have a hemoglobin of 6.9.  Patient transfused 2 units packed red blood cells hemoglobin currently stable at 10.4.  Patient status post IV Feraheme on 04/11/2019.  Continue PPI.  Anemia panel consistent with anemia of chronic disease/iron deficiency anemia.  Folate levels are low at 3.8.  Vitamin B12 levels elevated at 1214.  Continue folic acid 1 mg daily.  Patient seen in consultation by gastroenterology who suspected patient does have a iron deficiency anemia with elevated ferritin secondary to acute phase reactant with superimposed folate deficiency.  Per GI due to lack of overt GI blood loss, negative FOBT,'s stable H&H no further inpatient work-up needed at this time and if further endoscopic work-up is needed can be done in the outpatient setting.  Follow H&H.  Appreciate GIs input and recommendations.  2.  Left heel gangrene  Patient was admitted in the process of undergoing arteriogram on day of admission with vascular surgery which was canceled due to patient's anemia.  WBC noted to be elevated at 13.  Patient afebrile.  Patient initially started on ertapenem IV in  the outpatient setting.  Patient seen by vascular surgery who suggested patient likely needs above-the-knee amputation.  Patient has been pancultured results pending with no growth to date.  Continue IV Zosyn and IV vancomycin.  Patient being followed by vascular surgery who recommended AKA this week.  Patient seems somewhat apprehensive.  Per vascular surgery.  3.  Acute kidney injury superimposed on chronic  kidney disease stage III/chronicsuprapubic  catheter Unclear baseline.  Urinalysis ordered but not done.  Repeat urinalysis ordered but not done.  Patient with a urine output of 900 cc over the past 24 hours. Urine of 84.  Urine creatinine of 53.75.  Creatinine currently at 2.02 seems to be stabilizing.  Patient with chronic suprapubic catheter.  Follow.    4.  Hypertension Continue amlodipine and hydralazine.  5.  Diabetes mellitus type 2 well controlled Patient reported to be a brittle diabetic.  Hemoglobin A1c was 6.7.  CBG of 310 this morning.  Patient noted to have had a cookie in his bed per RN.  Continue to hold oral hypoglycemic agents.  Continue current regimen of Levemir and sliding scale insulin.  Change CBGs to [facility] and at bedtime.  6.  Schizoaffective disorder Stable.  Continue Zoloft, Geodon, Remeron.  7.  Autism/intellectual disability Patient autistic at baseline and electively mute.  Patient nods his head or shakes his head to answer yes or no.  Patient noted to be able to write.  8.  History of seizure disorder No seizures noted.  Continue current regimen of Keppra.   9.  Metabolic acidosis Patient noted to be acidotic with a bicarb of 13.  Likely secondary to left gangrene is healed.  Acidosis improved on bicarb drip.  Discontinue bicarb drip.  Follow.   10.  Hypo-kalemia Potassium at 3.9 this morning.  Magnesium at 2.2.  Follow.   11.  Leukocytosis Likely secondary to gangrene heel..  Patient afebrile.  Chest x-ray negative for any acute infiltrate.  Urinalysis with large leukocytes, nitrite negative, greater than 50 WBCs.  Urine cultures pending.  Patient with chronic suprapubic catheter.  Patient on empiric IV Zosyn secondary to gangrene of the heel.  Follow for now.  12.  Nausea/emesis Patient noted to have nausea/emesis this morning.  Per RN guardian stated to her that patient gets nauseous with emesis whenever he is anxious.  Patient with flatus and noted to have bowel movement yesterday.  No bowel movement  this morning.  Will get abdominal films.  Ativan as needed.  Follow.   DVT prophylaxis: SCDs Code Status: Full Family Communication: No family at bedside. Disposition Plan: Patient with history of autism/intellectual disability, given history of anemia patient with high risk for decompensation and therefore will need to stay in house for planned amputation next week for gangrenous left heel for close monitoring.  Discharge when okay with vascular surgery.    Consultants:   Gastroenterology: Dr. Bryan Lemma 04/25/2019  Vascular surgery Scot Dock 05/03/2019  Procedures:   Transfused 2 units packed red blood cells 04/27/2019  Antimicrobials:   IV Zosyn 05/04/2019  IV vancomycin 04/14/2019   Subjective: Patient with mutism, patient answers yes or no to questions by nodding and shaking his head.  Patient noted this morning to have emesis.  No chest pain.  No shortness of breath.  No overt bleeding.  No abdominal pain.  Per RN patient's guardian stated that patient does have nausea and emesis whenever he is anxious.  Patient passing flatus.  Patient noted to have a bowel movement yesterday.  Objective: Vitals:   04/13/19 1412 04/13/19 2223 04/14/19 0406 04/14/19 1005  BP: (!) 107/57 (!) 103/58 114/63 137/70  Pulse: 73 82 74   Resp:   16   Temp: 97.8 F (36.6 C) 97.6 F (36.4 C) (!) 97.5 F (36.4 C)   TempSrc: Oral Oral Oral   SpO2: 99% 100% 100%   Weight:   80.7 kg   Height:        Intake/Output Summary (Last 24 hours) at 04/14/2019 1119 Last data filed at 04/14/2019 [id number] Gross per 24 hour  Intake 2153.86 ml  Output 1350 ml  Net 803.86 ml   Filed Weights   04/11/19 0304 04/12/19 0500 04/14/19 0406  Weight: 71.4 kg 73.4 kg 80.7 kg    Examination:  General exam: NAD Respiratory system: CTA B.  No wheezes, no crackles, no rhonchi.  Normal respiratory effort.  Cardiovascular system: Regular rate rhythm no murmurs rubs or gallops.  No JVD.  No lower extremity edema.  Gastrointestinal system: Abdomen is nontender, nondistended, soft, positive bowel sounds.  No rebound.  No guarding.   Central nervous system: Alert and oriented. No focal neurological deficits. Extremities: Contractures of lower extremities.  Left heel wrapped in bandage.  Lower extremities in heel floaters. Skin: Left heel wrapped in bandage.   Psychiatry: Judgement and insight unable to assess as patient with mutism. Mood & affect appropriate.     Data Reviewed: I have personally reviewed following labs and imaging studies  CBC: Recent Labs  Lab 04/11/2019 0934 04/16/2019 1453 04/11/19 0409 04/12/19 0420 04/13/19 0548 04/14/19 0443  WBC 13.0*  --  11.2* 11.8* 13.7* 13.5*  NEUTROABS  --   --   --  8.4* 9.8* 9.9*  HGB 7.8* 6.9* 10.0* 9.8* 10.4* 10.0*  HCT 25.4* 21.9* 30.8* 29.6* 31.4* 31.3*  MCV 80.4  --  81.1 78.9* 78.9* 80.5  PLT 491*  --  398 393 371 [id number]   Basic Metabolic Panel: Recent Labs  Lab 04/05/2019 0934 04/11/19 0409 04/12/19 0420 04/13/19 0548 04/14/19 0443  NA 139 136 139 139 140  K 4.0 4.1 3.5 3.0* 3.9  CL 108 107 103 100 97*  CO2 18* 13* 21* 27 30  GLUCOSE 111* 467* 339* 128* 231*  BUN 46* 41* 34* 24* 19  CREATININE 2.16* 2.28* 2.39* 1.98* 2.02*  CALCIUM 9.2 8.3* 8.4* 8.0* 7.8*  MG  --   --   --  1.7 2.2   GFR: Estimated Creatinine Clearance: 41.7 mL/min (A) (by C-G formula based on SCr of 2.02 mg/dL (H)). Liver Function Tests: Recent Labs  Lab 04/14/19 0715  AST 42*  ALT 19  ALKPHOS 87  BILITOT 0.9  PROT 6.1*  ALBUMIN 1.7*   No results for input(s): LIPASE, AMYLASE in the last 168 hours. No results for input(s): AMMONIA in the last 168 hours. Coagulation Profile: No results for input(s): INR, PROTIME in the last 168 hours. Cardiac Enzymes: No results for input(s): CKTOTAL, CKMB, CKMBINDEX, TROPONINI in the last 168 hours. BNP (last 3 results) No results for input(s): PROBNP in the last 8760 hours. HbA1C: No results for input(s): HGBA1C in  the last 72 hours. CBG: Recent Labs  Lab 04/12/19 2213 04/13/19 0136 04/13/19 1135 04/13/19 1553 04/14/19 0648  GLUCAP 169* 98 212* 123* 310*   Lipid Profile: No results for input(s): CHOL, HDL, LDLCALC, TRIG, CHOLHDL, LDLDIRECT in the last 72 hours. Thyroid Function Tests: No results for input(s): TSH, T4TOTAL, FREET4, T3FREE, THYROIDAB in the last 72 hours. Anemia  Panel: No results for input(s): VITAMINB12, FOLATE, FERRITIN, TIBC, IRON, RETICCTPCT in the last 72 hours. Sepsis Labs: No results for input(s): PROCALCITON, LATICACIDVEN in the last 168 hours.  Recent Results (from the past 240 hour(s))  SARS Coronavirus 2 [facility] order, Performed in [facility] hospital lab) Nasopharyngeal Nasopharyngeal Swab     Status: None   Collection Time: 04/06/2019  8:30 AM   Specimen: Nasopharyngeal Swab  Result Value Ref Range Status   SARS Coronavirus 2 NEGATIVE NEGATIVE Final    Comment: (NOTE) If result is NEGATIVE SARS-CoV-2 target nucleic acids are NOT DETECTED. The SARS-CoV-2 RNA is generally detectable in upper and lower  respiratory specimens during the acute phase of infection. The lowest  concentration of SARS-CoV-2 viral copies this assay can detect is 250  copies / mL. A negative result does not preclude SARS-CoV-2 infection  and should not be used as the sole basis for treatment or other  patient management decisions.  A negative result may occur with  improper specimen collection / handling, submission of specimen other  than nasopharyngeal swab, presence of viral mutation(s) within the  areas targeted by this assay, and inadequate number of viral copies  (<250 copies / mL). A negative result must be combined with clinical  observations, patient history, and epidemiological information. If result is POSITIVE SARS-CoV-2 target nucleic acids are DETECTED. The SARS-CoV-2 RNA is generally detectable in upper and lower  respiratory specimens dur ing the acute phase of  infection.  Positive  results are indicative of active infection with SARS-CoV-2.  Clinical  correlation with patient history and other diagnostic information is  necessary to determine patient infection status.  Positive results do  not rule out bacterial infection or co-infection with other viruses. If result is PRESUMPTIVE POSTIVE SARS-CoV-2 nucleic acids MAY BE PRESENT.   A presumptive positive result was obtained on the submitted specimen  and confirmed on repeat testing.  While 2019 novel coronavirus  (SARS-CoV-2) nucleic acids may be present in the submitted sample  additional confirmatory testing may be necessary for epidemiological  and / or clinical management purposes  to differentiate between  SARS-CoV-2 and other Sarbecovirus currently known to infect humans.  If clinically indicated additional testing with an alternate test  methodology [phone]) is advised. The SARS-CoV-2 RNA is generally  detectable in upper and lower respiratory sp ecimens during the acute  phase of infection. The expected result is Negative. Fact Sheet for Patients:  StrictlyIdeas.no Fact Sheet for Healthcare Providers: BankingDealers.co.za This test is not yet approved or cleared by the [place] FDA and has been authorized for detection and/or diagnosis of SARS-CoV-2 by FDA under an Emergency Use Authorization (EUA).  This EUA will remain in effect (meaning this test can be used) for the duration of the COVID-19 declaration under Section 564(b)(1) of the Act, 21 U.S.C. section 360bbb-3(b)(1), unless the authorization is terminated or revoked sooner. Performed at [facility], [place] [address], [place], [place] [address]   Culture, blood (routine x 2)     Status: None (Preliminary result)   Collection Time: 04/15/2019  2:30 PM   Specimen: BLOOD RIGHT ARM  Result Value Ref Range Status   Specimen Description BLOOD RIGHT ARM  Final   Special  Requests   Final    BOTTLES DRAWN AEROBIC AND ANAEROBIC Blood Culture adequate volume   Culture   Final    NO GROWTH 3 DAYS Performed at [facility], [place] [address] [place], [place] [address]  Report Status PENDING  Incomplete  Culture, blood (routine x 2)     Status: None (Preliminary result)   Collection Time: 04/07/2019  3:02 PM   Specimen: BLOOD RIGHT ARM  Result Value Ref Range Status   Specimen Description BLOOD RIGHT ARM  Final   Special Requests AEROBIC BOTTLE ONLY Blood Culture adequate volume  Final   Culture   Final    NO GROWTH 3 DAYS Performed at [facility], [address] [place], [place] [address]    Report Status PENDING  Incomplete  MRSA PCR Screening     Status: None   Collection Time: 04/11/19  5:00 AM   Specimen: Nasopharyngeal  Result Value Ref Range Status   MRSA by PCR NEGATIVE NEGATIVE Final    Comment:        The GeneXpert MRSA Assay (FDA approved for NASAL specimens only), is one component of a comprehensive MRSA colonization surveillance program. It is not intended to diagnose MRSA infection nor to guide or monitor treatment for MRSA infections. Performed at [facility], [place] [address], [place], [place] [address]   Surgical PCR screen     Status: None   Collection Time: 04/14/19  4:14 AM   Specimen: Urine, Suprapubic; Nasal Swab  Result Value Ref Range Status   MRSA, PCR NEGATIVE NEGATIVE Final   Staphylococcus aureus NEGATIVE NEGATIVE Final    Comment: (NOTE) The Xpert SA Assay (FDA approved for NASAL specimens in patients 73 years of age and older), is one component of a comprehensive surveillance program. It is not intended to diagnose infection nor to guide or monitor treatment. Performed at [facility], [place] [address], [place], [place] [address]          Radiology Studies: Dg Chest Port 1 View  Result Date: 04/13/2019 CLINICAL DATA:  Leukocytosis. EXAM: PORTABLE CHEST 1 VIEW COMPARISON:  Chest  radiograph and CT 03/12/2019 FINDINGS: The patient is slightly rotated to the right. The cardiomediastinal silhouette is within normal limits. Minimal linear opacity in the left lung base suggests atelectasis. The lungs are otherwise clear. No sizable pleural effusion or pneumothorax is identified. No acute osseous abnormality is seen. IMPRESSION: No active disease. Electronically Signed   By: Logan Bores M.D.   On: 04/13/2019 08:51        Scheduled Meds: . amLODipine  10 mg Oral Daily  . folic acid  1 mg Oral Daily  . hydrALAZINE  25 mg Oral TID  . insulin aspart  0-9 Units Subcutaneous TID WC  . insulin aspart  3 Units Subcutaneous TID WC  . insulin detemir  10 Units Subcutaneous Daily  . levETIRAcetam  250 mg Oral BID  . metoCLOPramide (REGLAN) injection  5 mg Intravenous Q6H  . mirtazapine  15 mg Oral QHS  . mupirocin ointment  1 application Nasal BID  . pantoprazole  40 mg Oral Q0600  . sertraline  200 mg Oral Daily  . sodium chloride flush  3 mL Intravenous Q12H  . ziprasidone  20 mg Oral Daily  . ziprasidone  40 mg Oral QHS   Continuous Infusions: . piperacillin-tazobactam (ZOSYN)  IV 3.375 g (04/14/19 0800)  . vancomycin Stopped (04/14/19 0058)     LOS: 2 days    Time spent: 40 minutes    Irine Seal, MD [facility]  If 7PM-7AM, please contact night-coverage [email or web address] 04/14/2019, 11:19 AM

## 2019-04-14 NOTE — Progress Notes (Signed)
Patient vomited approximately 466mls of fuid and undigested food. Given zofran 4 mg IV. Resting and nodded yes when asked if he was feeling better. Resting in bed at 1100 and nodded no when asked if he still felt any nausea or thought he would vomit any more.

## 2019-04-14 NOTE — Progress Notes (Signed)
Patient's blood sugar this morning was 310.  Cookie wrapper and cookie crumbs found in patient's bed and RN asked patient if he had eaten a cookie and he shook his head yes.

## 2019-04-14 NOTE — Progress Notes (Signed)
CRITICAL VALUE ALERT  Critical Value:  Calcium 7.8  Date & Time Notied:  04/14/2019 at [place]  Provider Notified: Lorra Hals paged via [place] at [phone] on 04/14/2019  Orders Received/Actions taken: awaiting call back/orders

## 2019-04-14 NOTE — Progress Notes (Signed)
Spoke to Betsey Amen, Education officer, museum, Tourist information centre manager for patient . During discussion I was told that patient has nausea, vomiting and decreased appetite when he gets stressed and anxious. Above relayed to Dr. Grandville Silos and orders received for Ativan.  Melissa requested to speak with physicians regarding plan of care, paged Dr. Trula Slade and he was notified of request and cell number for [facility]. Cell number, [phone], put into epic under contacts and work number put in proper place. Dr. Trula Slade will call Melissa back.

## 2019-04-14 NOTE — Progress Notes (Signed)
Blood sugar taken at 2220 was 30.  Patient awake and alert.  Per day shift RN patient did not eat very much throughout the day.  Patient drank 8oz of grape juice and blood sugar recheck was 84 at 2301.

## 2019-04-14 NOTE — Progress Notes (Signed)
Patients insulin held, patient indicated his sugar was low, rechecked at 1354 cbg 62, given orange juice and graham crackers.  Rechecked at 1428 cbg 88. 1705 cbg 48 ate evening meal and rechecked for cbg 155 after he ate.

## 2019-04-15 ENCOUNTER — Encounter (HOSPITAL_COMMUNITY): Payer: Self-pay | Admitting: Certified Registered Nurse Anesthetist

## 2019-04-15 ENCOUNTER — Inpatient Hospital Stay (HOSPITAL_COMMUNITY): Payer: Medicaid Other

## 2019-04-15 LAB — BASIC METABOLIC PANEL
Anion gap: 12 (ref 5–15)
BUN: 14 mg/dL (ref 6–20)
CO2: 27 mmol/L (ref 22–32)
Calcium: 7.7 mg/dL — ABNORMAL LOW (ref 8.9–10.3)
Chloride: 98 mmol/L (ref 98–111)
Creatinine, Ser: 2 mg/dL — ABNORMAL HIGH (ref 0.61–1.24)
GFR calc Af Amer: 42 mL/min — ABNORMAL LOW (ref >60)
GFR calc non Af Amer: 36 mL/min — ABNORMAL LOW (ref >60)
Glucose, Bld: 161 mg/dL — ABNORMAL HIGH (ref 70–99)
Potassium: 3.5 mmol/L (ref 3.5–5.1)
Sodium: 137 mmol/L (ref 135–145)

## 2019-04-15 LAB — CBC WITH DIFFERENTIAL/PLATELET
Abs Immature Granulocytes: 0.08 10*3/uL — ABNORMAL HIGH (ref 0.00–0.07)
Basophils Absolute: 0 10*3/uL (ref 0.0–0.1)
Basophils Relative: 0 %
Eosinophils Absolute: 0.7 10*3/uL — ABNORMAL HIGH (ref 0.0–0.5)
Eosinophils Relative: 5 %
HCT: 31.2 % — ABNORMAL LOW (ref 39.0–52.0)
Hemoglobin: 9.7 g/dL — ABNORMAL LOW (ref 13.0–17.0)
Immature Granulocytes: 1 %
Lymphocytes Relative: 10 %
Lymphs Abs: 1.5 10*3/uL (ref 0.7–4.0)
MCH: 25.7 pg — ABNORMAL LOW (ref 26.0–34.0)
MCHC: 31.1 g/dL (ref 30.0–36.0)
MCV: 82.8 fL (ref 80.0–100.0)
Monocytes Absolute: 1 10*3/uL (ref 0.1–1.0)
Monocytes Relative: 7 %
Neutro Abs: 11 10*3/uL — ABNORMAL HIGH (ref 1.7–7.7)
Neutrophils Relative %: 77 %
Platelets: 355 10*3/uL (ref 150–400)
RBC: 3.77 MIL/uL — ABNORMAL LOW (ref 4.22–5.81)
RDW: 17.5 % — ABNORMAL HIGH (ref 11.5–15.5)
WBC: 14.3 10*3/uL — ABNORMAL HIGH (ref 4.0–10.5)
nRBC: 0 % (ref 0.0–0.2)

## 2019-04-15 LAB — CULTURE, BLOOD (ROUTINE X 2)
Culture: NO GROWTH
Culture: NO GROWTH
Special Requests: ADEQUATE
Special Requests: ADEQUATE

## 2019-04-15 LAB — GLUCOSE, CAPILLARY
Glucose-Capillary: 109 mg/dL — ABNORMAL HIGH (ref 70–99)
Glucose-Capillary: 129 mg/dL — ABNORMAL HIGH (ref 70–99)
Glucose-Capillary: 142 mg/dL — ABNORMAL HIGH (ref 70–99)
Glucose-Capillary: 30 mg/dL — CL (ref 70–99)
Glucose-Capillary: 332 mg/dL — ABNORMAL HIGH (ref 70–99)
Glucose-Capillary: 68 mg/dL — ABNORMAL LOW (ref 70–99)
Glucose-Capillary: 76 mg/dL (ref 70–99)
Glucose-Capillary: 84 mg/dL (ref 70–99)
Glucose-Capillary: 94 mg/dL (ref 70–99)

## 2019-04-15 LAB — MAGNESIUM: Magnesium: 1.8 mg/dL (ref 1.7–2.4)

## 2019-04-15 LAB — CALCIUM, IONIZED: Calcium, Ionized, Serum: 4.7 mg/dL (ref 4.5–5.6)

## 2019-04-15 LAB — URINE CULTURE: Culture: 80000 — AB

## 2019-04-15 MED ORDER — PRO-STAT SUGAR FREE PO LIQD
30.0000 mL | Freq: Three times a day (TID) | ORAL | Status: DC
  Administered 2019-04-16 – 2019-04-26 (×23): 30 mL via ORAL
  Filled 2019-04-15 (×22): qty 30

## 2019-04-15 MED ORDER — BOOST / RESOURCE BREEZE PO LIQD CUSTOM
1.0000 | Freq: Three times a day (TID) | ORAL | Status: DC
  Administered 2019-04-15 – 2019-04-22 (×18): 1 via ORAL

## 2019-04-15 MED ORDER — JUVEN PO PACK
1.0000 | PACK | Freq: Two times a day (BID) | ORAL | Status: DC
  Administered 2019-04-16 – 2019-04-26 (×15): 1 via ORAL
  Filled 2019-04-15 (×21): qty 1

## 2019-04-15 MED ORDER — ADULT MULTIVITAMIN W/MINERALS CH
1.0000 | ORAL_TABLET | Freq: Every day | ORAL | Status: DC
  Administered 2019-04-16 – 2019-04-26 (×10): 1 via ORAL
  Filled 2019-04-15 (×10): qty 1

## 2019-04-15 NOTE — Progress Notes (Signed)
Physical Therapy Treatment Patient Details Name: [name] MRN: [id number] DOB: August 22, 1961 Today's Date: 04/15/2019    History of Present Illness Pt is a 58 y/o male who presents with anemia and L heel wound, found to be gangrenous. Pt was assessed by vascular surgery and it appears an AKA is planned for next week (week of 8/10). PMH significant for autism with elective mutism, schizophrenic disorder, DM, suprapubic catheter.    PT Comments    Pt with painful abdomen, but willing to work with therapy today. Pt convinced to transfer OOB and sit in recliner. Pt able to initiate LE management off bed but requires maxAx2 for bring trunk to upright. Once in upright requires maxAx2 for steadying in seated but with time was able to progress to min guard for sitting balance. Pt requires maxAx2 for lateral scoot to transfer, pt became anxious during transfer requiring total A to finish. Pt with intense cramping in abdomen throughout session and hopeful change in position will provide some relief. PT will continue to follow acutely.   Follow Up Recommendations  SNF;Supervision/Assistance - 24 hour     Equipment Recommendations  None recommended by PT       Precautions / Restrictions Precautions Precautions: None Precaution Comments: Elective mutism, suprapubic catheter, skin breakdown in perineal area. Restrictions Weight Bearing Restrictions: No    Mobility  Bed Mobility Overal bed mobility: Needs Assistance Bed Mobility: Rolling;Sidelying to Sit Rolling: +2 for physical assistance;Mod assist;Max assist Sidelying to sit: +2 for physical assistance;Min assist;Mod assist       General bed mobility comments: Pt able to initate moving legs to EOB. Pt require increased assist to elevate trunk from side-lying and was hesitant to engage in bed mobility initially, but was able to transition from supine >EOB with MAX +2. Suspect pt required increased assist d/t hesitancy related to first  time sitting EOB.  Transfers Overall transfer level: Needs assistance Equipment used: 2 person hand held assist Transfers: Lateral/Scoot Transfers          Lateral/Scoot Transfers: +2 physical assistance;Total assist;Max assist General transfer comment: Pt anxious and hesitant during transfer      Balance Overall balance assessment: Needs assistance Sitting-balance support: Feet supported;Bilateral upper extremity supported Sitting balance-Leahy Scale: Poor Sitting balance - Comments: Pt reaching out for UE support with intermittent periods of time where pt was able to maintain dynamic sitting balance Postural control: Right lateral lean(d/t poor balance as well as pt wanting to lay back down)                                  Cognition Arousal/Alertness: Awake/alert Behavior During Therapy: Anxious Overall Cognitive Status: History of cognitive impairments - at baseline                                 General Comments: pt anxious and emotional during session         General Comments General comments (skin integrity, edema, etc.): Pt with skin break down of scrotum which became painful with transfer until readjusted.       Pertinent Vitals/Pain Pain Assessment: Faces Faces Pain Scale: Hurts whole lot Pain Location: abdomen Pain Descriptors / Indicators: Cramping;Grimacing;Guarding Pain Intervention(s): Limited activity within patient's tolerance;Monitored during session;Repositioned           PT Goals (current goals can now be found in the care  plan section) Acute Rehab PT Goals Patient Stated Goal: Pt did not state goals this session PT Goal Formulation: With patient Time For Goal Achievement: 04/26/19 Potential to Achieve Goals: Fair Progress towards PT goals: Progressing toward goals    Frequency    Min 2X/week      PT Plan Current plan remains appropriate    Co-evaluation PT/OT/SLP Co-Evaluation/Treatment: Yes Reason  for Co-Treatment: For patient/therapist safety;Necessary to address cognition/behavior during functional activity PT goals addressed during session: Mobility/safety with mobility;Balance OT goals addressed during session: ADL's and self-care      AM-PAC PT "6 Clicks" Mobility   Outcome Measure  Help needed turning from your back to your side while in a flat bed without using bedrails?: A Little Help needed moving from lying on your back to sitting on the side of a flat bed without using bedrails?: A Lot Help needed moving to and from a bed to a chair (including a wheelchair)?: Total Help needed standing up from a chair using your arms (e.g., wheelchair or bedside chair)?: Total Help needed to walk in hospital room?: Total Help needed climbing 3-5 steps with a railing? : Total 6 Click Score: 9    End of Session Equipment Utilized During Treatment: Gait belt Activity Tolerance: Patient tolerated treatment well Patient left: in bed;with call bell/phone within reach;with nursing/sitter in room Nurse Communication: Mobility status;Need for lift equipment PT Visit Diagnosis: Difficulty in walking, not elsewhere classified (R26.2);Muscle weakness (generalized) (M62.81)     Time: [id number] PT Time Calculation (min) (ACUTE ONLY): 23 min  Charges:  $Therapeutic Activity: 8-22 mins                     Belia Febo B. Migdalia Dk PT, DPT Acute Rehabilitation Services Pager [phone] Office [phone]    [place] 04/15/2019, 4:10 PM

## 2019-04-15 NOTE — Progress Notes (Signed)
    Subjective  -   Does not want amputation   Physical Exam:  Left heel ulcer with gangrenous changes and odor       Assessment/Plan:    The patient nods his head stating that he does not want a amputation.  I spoke with Melissa this afternoon.  I do not fel comfortable proceeding with amputation tomorrow.  He is going to need a amputaion soon.  The patient is asking about a BKA, however I think a AKA would be better due to his non-ambulatory state and the risk of contracture or non-healing with a BKA.     [name] 04/15/2019 4:33 PM --  Vitals:   04/14/19 2222 04/15/19 0355  BP: 126/79 127/65  Pulse: 82 84  Resp:  18  Temp: 98.2 F (36.8 C) 97.9 F (36.6 C)  SpO2: 94% 95%    Intake/Output Summary (Last 24 hours) at 04/15/2019 1633 Last data filed at 04/15/2019 1309 Gross per 24 hour  Intake 1142.53 ml  Output 1900 ml  Net -757.47 ml     Laboratory CBC    Component Value Date/Time   WBC 14.3 (H) 04/15/2019 0501   HGB 9.7 (L) 04/15/2019 0501   HCT 31.2 (L) 04/15/2019 0501   PLT 355 04/15/2019 0501    BMET    Component Value Date/Time   NA 137 04/15/2019 0501   K 3.5 04/15/2019 0501   CL 98 04/15/2019 0501   CO2 27 04/15/2019 0501   GLUCOSE 161 (H) 04/15/2019 0501   BUN 14 04/15/2019 0501   CREATININE 2.00 (H) 04/15/2019 0501   CALCIUM 7.7 (L) 04/15/2019 0501   GFRNONAA 36 (L) 04/15/2019 0501   GFRAA 42 (L) 04/15/2019 0501    COAG Lab Results  Component Value Date   INR 1.24 02/16/2012   INR 1.06 01/15/2011   No results found for: PTT  Antibiotics Anti-infectives (From admission, onward)   Start     Dose/Rate Route Frequency Ordered Stop   04/11/19 1700  vancomycin (VANCOCIN) IVPB 1000 mg/200 mL premix     1,000 mg 200 mL/hr over 60 Minutes Intravenous Every 24 hours 04/21/2019 1645     04/11/19 0100  piperacillin-tazobactam (ZOSYN) IVPB 3.375 g     3.375 g 12.5 mL/hr over 240 Minutes Intravenous Every 8 hours 04/24/2019 1641     04/16/2019 1615  vancomycin (VANCOCIN) IVPB 1000 mg/200 mL premix     1,000 mg 200 mL/hr over 60 Minutes Intravenous  Once 04/21/2019 1611 05/01/2019 2321   04/23/2019 1615  piperacillin-tazobactam (ZOSYN) IVPB 3.375 g     3.375 g 100 mL/hr over 30 Minutes Intravenous  Once 04/19/2019 1611 04/27/2019 1919       V. Leia Alf, M.D., [facility] [place] Office: [phone] Pager:  [phone]

## 2019-04-15 NOTE — NC FL2 (Signed)
[place] LEVEL OF CARE SCREENING TOOL     IDENTIFICATION  Patient Name: [name] Birthdate: 31-May-1961 Sex: male Admission Date (Current Location): 04/20/2019  [place] and [place] Number:  [place] [id number] [place] and Address:  [facility] [place], [place] [address] [place], [place] [address]      Provider Number: [id number]  Attending Physician Name and Address:  Aline August, MD  Relative Name and Phone Number:  guardian Betsey Amen [phone] ext [phone]    Current Level of Care: Hospital Recommended Level of Care: [place] Prior Approval Number:    Date Approved/Denied:   PASRR Number: [id number]  Discharge Plan: SNF    Current Diagnoses: Patient Active Problem List   Diagnosis Date Noted  . 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 Pressure injury of skin 04/11/2019  . Folate deficiency   . Anemia, blood loss 04/22/2019  . Leukocytosis 04/22/2019  . History of seizure 04/05/2019  . Gangrene ([place]) 04/07/2019  . Respiratory failure ([place]) 02/08/2012  . Diabetes mellitus out of control ([place]) 02/08/2012  . HCAP (healthcare-associated pneumonia) 10/22/2011  . SIRS (systemic inflammatory response syndrome) ([place]) 10/22/2011  . Acute kidney injury superimposed on CKD ([place]) 10/22/2011  . Hyperkalemia 10/22/2011  . Anemia 10/22/2011  . Hyperglycemia 10/22/2011  . Abnormal urinalysis 10/22/2011  . Chronic indwelling Foley catheter 10/22/2011  . Type II or unspecified type diabetes mellitus without mention of complication, not stated as uncontrolled 09/19/2011  . Acute renal failure ([place]) 09/19/2011  . Pneumonia 09/19/2011  . Sepsis(995.91) 09/19/2011  . UTI (lower urinary tract infection) 09/19/2011  . Intellectual disability 09/19/2011  . BPH (benign prostatic hyperplasia) 09/19/2011    Orientation RESPIRATION BLADDER Height & Weight     Self  Normal Indwelling catheter, Continent(suprapubic catheter)  Weight: 180 lb (81.6 kg) Height:  5\' 10"  (177.8 cm)  BEHAVIORAL SYMPTOMS/MOOD NEUROLOGICAL BOWEL NUTRITION STATUS      Incontinent Diet(see discharge summary)  AMBULATORY STATUS COMMUNICATION OF NEEDS Skin     Verbally PU Stage and Appropriate Care, Other (Comment)(Stage 2 on sacrum and scrotum; wound on L heel; MASD on anus)   PU Stage 2 Dressing: (stage 2 on sacrum; stage 2 on sacrum)                   Personal Care Assistance Level of Assistance              Functional Limitations Info  Speech, Hearing, Sight(developmental delays) Sight Info: Adequate Hearing Info: Adequate Speech Info: Impaired    SPECIAL CARE FACTORS FREQUENCY                       Contractures Contractures Info: Not present    Additional Factors Info  Code Status, Allergies, Psychotropic, Insulin Sliding Scale Code Status Info: Full Code Allergies Info: No Known Allegies Psychotropic Info: mirtazapine (REMERON) tablet 15 mg daily at bedtime PO; sertraline (ZOLOFT) tablet 200 mg daily PO; ziprasidone (GEODON) capsule 20 mg daily PO; Insulin Sliding Scale Info: insulin aspart (novoLOG) injection 0-9 Units 3x daily with meals; insulin aspart (novoLOG) injection 3 Units 3x daily with meals; insulin detemir (LEVEMIR) injection 10 Units daily; ziprasidone (GEODON) capsule 40 mg daily at bedtime PO       Current Medications (04/15/2019):  This is the current hospital active medication list Current Facility-Administered Medications  Medication Dose Route Frequency Provider Last Rate Last Dose  . acetaminophen (TYLENOL) tablet 650 mg  650 mg Oral  Q6H PRN Norval Morton, MD   650 mg at 04/11/19 0348   Or  . acetaminophen (TYLENOL) suppository 650 mg  650 mg Rectal Q6H PRN Fuller Plan A, MD      . albuterol (PROVENTIL) (2.5 MG/3ML) 0.083% nebulizer solution 2.5 mg  2.5 mg Nebulization Q6H PRN Smith, Rondell A, MD      . amLODipine (NORVASC) tablet 10 mg  10 mg Oral Daily Tamala Julian, Rondell A, MD   10  mg at 04/15/19 1056  . feeding supplement (BOOST / RESOURCE BREEZE) liquid 1 Container  1 Container Oral TID BM Alekh, Kshitiz, MD      . feeding supplement (PRO-STAT SUGAR FREE 64) liquid 30 mL  30 mL Oral TID WC Alekh, Kshitiz, MD      . folic acid (FOLVITE) tablet 1 mg  1 mg Oral Daily Eugenie Filler, MD   1 mg at 04/15/19 1056  . hydrALAZINE (APRESOLINE) tablet 25 mg  25 mg Oral TID Fuller Plan A, MD   25 mg at 04/15/19 1055  . insulin aspart (novoLOG) injection 0-9 Units  0-9 Units Subcutaneous TID WC Eugenie Filler, MD   7 Units at 04/15/19 [phone]  . insulin aspart (novoLOG) injection 3 Units  3 Units Subcutaneous TID WC Blount, Lolita Cram, NP   3 Units at 04/15/19 [phone]  . insulin detemir (LEVEMIR) injection 10 Units  10 Units Subcutaneous Daily Fuller Plan A, MD   10 Units at 04/15/19 1055  . levETIRAcetam (KEPPRA) tablet 250 mg  250 mg Oral BID Fuller Plan A, MD   250 mg at 04/15/19 1055  . LORazepam (ATIVAN) tablet 0.5 mg  0.5 mg Oral BID PRN Eugenie Filler, MD   0.5 mg at 04/14/19 1350  . mirtazapine (REMERON) tablet 15 mg  15 mg Oral QHS Smith, Rondell A, MD   15 mg at 04/14/19 2219  . multivitamin with minerals tablet 1 tablet  1 tablet Oral Daily Alekh, Kshitiz, MD      . nutrition supplement (JUVEN) (JUVEN) powder packet 1 packet  1 packet Oral BID BM Alekh, Kshitiz, MD      . ondansetron (ZOFRAN) tablet 4 mg  4 mg Oral Q6H PRN Fuller Plan A, MD       Or  . ondansetron (ZOFRAN) injection 4 mg  4 mg Intravenous Q6H PRN Fuller Plan A, MD   4 mg at 04/14/19 1055  . pantoprazole (PROTONIX) EC tablet 40 mg  40 mg Oral Q0600 Vena Rua, PA-C   40 mg at 04/15/19 [id number]  . piperacillin-tazobactam (ZOSYN) IVPB 3.375 g  3.375 g Intravenous Q8H Karren Cobble, RPH 12.5 mL/hr at 04/15/19 0842 3.375 g at 04/15/19 0842  . sertraline (ZOLOFT) tablet 200 mg  200 mg Oral Daily Smith, Rondell A, MD   200 mg at 04/15/19 1055  . sodium chloride flush (NS) 0.9 % injection  3 mL  3 mL Intravenous Q12H Smith, Rondell A, MD   3 mL at 04/13/19 0800  . vancomycin (VANCOCIN) IVPB 1000 mg/200 mL premix  1,000 mg Intravenous Q24H Karren Cobble,    Stopped at 04/15/19 0257  . ziprasidone (GEODON) capsule 20 mg  20 mg Oral Daily Tamala Julian, Rondell A, MD   20 mg at 04/15/19 1056  . ziprasidone (GEODON) capsule 40 mg  40 mg Oral QHS Fuller Plan A, MD   40 mg at 04/14/19 2219     Discharge Medications: Please see discharge summary for a list  of discharge medications.  Relevant Imaging Results:  Relevant Lab Results:   Additional Information SS#[id number] [place], [place]

## 2019-04-15 NOTE — Progress Notes (Signed)
Occupational Therapy Treatment Patient Details Name: [name] MRN: [id number] DOB: 1960-09-07 Today's Date: 04/15/2019    History of present illness Pt is a 58 y/o male who presents with anemia and L heel wound, found to be gangrenous. Pt was assessed by vascular surgery and it appears an AKA is planned for next week (week of 8/10). PMH significant for autism with elective mutism, schizophrenic disorder, DM, suprapubic catheter.   OT comments  Pt seen for ADL progression this date. Session focused on functional transfer training. Pt initially hesitant about transfer but agreeable. Pt required MAX +2 for bed mobility and MAX+2 for lateral scoot to recliner. Additional focus on sitting balance EOB using bil UE support as pt with difficulty maintaining statically. Pt limited by pain, and nausea during session. DC plan remains appropriate. Will continue to follow and note possible AKA surgery sometime this week.    Follow Up Recommendations  SNF;Supervision/Assistance - 24 hour    Equipment Recommendations  None recommended by OT    Recommendations for Other Services      Precautions / Restrictions Precautions Precautions: None Precaution Comments: Elective mutism, suprapubic catheter, skin breakdown in perineal area. Restrictions Weight Bearing Restrictions: No       Mobility Bed Mobility Overal bed mobility: Needs Assistance Bed Mobility: Rolling;Sidelying to Sit Rolling: +2 for physical assistance;Mod assist;Max assist Sidelying to sit: +2 for physical assistance;Mod assist;Max assist       General bed mobility comments: Pt able to initate moving legs to EOB. Pt require increased assist to elevate trunk from side-lying and was hesitant to engage in bed mobility initially, but was able to transition from supine >EOB with MAX +2. Suspect pt required increased assist d/t hesitancy related to first time sitting EOB.  Transfers Overall transfer level: Needs  assistance Equipment used: 2 person hand held assist Transfers: Lateral/Scoot Transfers          Lateral/Scoot Transfers: +2 physical assistance;Total assist;Max assist General transfer comment: Pt anxious and hesitant during transfer    Balance Overall balance assessment: Needs assistance Sitting-balance support: Feet supported;Bilateral upper extremity supported Sitting balance-Leahy Scale: Poor Sitting balance - Comments: Pt reaching out for UE support and requires cues to utilize UE support; intermittent periods of time where pt was able to maintain static sitting balance with close minguard assist  Postural control: Right lateral lean(d/t poor balance as well as pt wanting to lay back down)                                 ADL either performed or assessed with clinical judgement   ADL Overall ADL's : (P) Needs assistance/impaired     Grooming: (P) Wash/dry face;Set up;Min guard;Sitting Grooming Details (indicate cue type and reason): (P) seated in recliner                             Functional mobility during ADLs: Maximal assistance;+2 for physical assistance;+2 for safety/equipment General ADL Comments: Pt nauseous during session- session limited to functional transfer training from EOB>chair. Pt limited by pain in abdomen and became tearful after transfer d/t increased pain/ nausea.                       Cognition Arousal/Alertness: Awake/alert Behavior During Therapy: Anxious Overall Cognitive Status: History of cognitive impairments - at baseline  General Comments: pt anxious and emotional during session        Exercises     Shoulder Instructions       General Comments      Pertinent Vitals/ Pain       Pain Assessment: Faces Faces Pain Scale: Hurts whole lot Pain Intervention(s): Limited activity within patient's tolerance;Monitored during session(pt reprots increaed pain after  transfering to recliner chair- chair reclined to assist with pain in abdomen)  Home Living                                          Prior Functioning/Environment              Frequency  Min 2X/week        Progress Toward Goals  OT Goals(current goals can now be found in the care plan section)  Progress towards OT goals: Progressing toward goals  Acute Rehab OT Goals Patient Stated Goal: Pt did not state goals this session OT Goal Formulation: With patient Time For Goal Achievement: 04/26/19 Potential to Achieve Goals: Good  Plan Discharge plan remains appropriate    Co-evaluation    PT/OT/SLP Co-Evaluation/Treatment: Yes Reason for Co-Treatment: For patient/therapist safety;Necessary to address cognition/behavior during functional activity;To address functional/ADL transfers   OT goals addressed during session: ADL's and self-care      AM-PAC OT "6 Clicks" Daily Activity     Outcome Measure   Help from another person eating meals?: A Little Help from another person taking care of personal grooming?: A Little Help from another person toileting, which includes using toliet, bedpan, or urinal?: Total Help from another person bathing (including washing, rinsing, drying)?: A Lot Help from another person to put on and taking off regular upper body clothing?: A Lot Help from another person to put on and taking off regular lower body clothing?: Total 6 Click Score: 12    End of Session Equipment Utilized During Treatment: Gait belt;Other (comment)(drop arm recliner; bed pads to facilitate lateral scoot)  OT Visit Diagnosis: Unsteadiness on feet (R26.81);Other abnormalities of gait and mobility (R26.89);Muscle weakness (generalized) (M62.81)   Activity Tolerance Patient limited by pain;Patient tolerated treatment well;Other (comment)(pt nauseous during session, tearful/ anxious)   Patient Left in chair;with chair alarm set;with call bell/phone within  reach   Nurse Communication Mobility status;Need for lift equipment        Time: [id number] OT Time Calculation (min): 23 min  Charges: OT General Charges $OT Visit: 1 Visit OT Treatments $Self Care/Home Management : 8-22 mins  Lou Cal, OT Supplemental Rehabilitation Services Pager [phone] Office [phone]    [name] 04/15/2019, 2:38 PM

## 2019-04-15 NOTE — Progress Notes (Signed)
Patient ID: [name], male   DOB: Jun 02, 1961, 58 y.o.   MRN: [id number]  PROGRESS NOTE    [name]  [id number] DOB: 10/15/1960 DOA: 04/05/2019 PCP: Rosita Fire, MD   Brief Narrative:  58 y.o.malewith medical history significant ofsevere autism with elective mutism, schizoaffective disorder, diabetes mellitus type 2, chronic kidney disease, chronic suprapubic catheter for urinary strictures, who presented for CT angiogram of the left leg with Dr. Scot Dock for nonhealing ulcer. However, patient was found to have hemoglobin 7.8 for which the procedure was canceled.  He underwent surgical debridement on February 26, 2019 and was noted to have dropping hemoglobin.  Patient was recently found to have dropping hemoglobin for which Dr. Befekadu/nephrology had started iron infusions.  Assessment & Plan:   Left heel gangrene -Vascular surgery following.  Planning for AKA sometime this week. -Currently on vancomycin and Zosyn.  Leukocytosis -Probably from above.  Afebrile.  Cultures negative so far.  Continue vancomycin and Zosyn.  Iron deficiency anemia/anemia of chronic disease/folate deficiency -No overt signs of GI bleeding.  FOBT negative.  GI recommended outpatient GI evaluation and follow-up -Status post 2 units packed red cells transfusion since admission.  Hemoglobin stable currently.  Also status post IV Feraheme on 04/11/2019  -Folate levels low at 3.8.  Continue supplementation. -Vitamin B12 level at 1214 -Continue PPI  Acute kidney injury superimposed on chronic kidney disease stage III with history of chronic suprapubic catheter -Unclear baseline. -Currently stable at 2.  Monitor.  Hypertension -Continue amlodipine and hydralazine.  Diabetes mellitus type 2 with hyperglycemia -A1c 6.7.  Hypoglycemic this morning. -Oral meds on hold. -Continue Levemir with CBGs and SSI.  DC IV fluids with dextrose.  Schizoaffective disorder  -Stable.  Continue Zoloft, Geodon,  Remeron  Autism/intellectual disability -Patient autistic at baseline and electively mute.  Patient nods his head or shakes his head to answer yes or no.  History of seizure disorder -No seizures noted.  Continue Keppra.   DVT prophylaxis: SCDs Code Status: Full Family Communication: None at bedside Disposition Plan: Depends on clinical outcome  Consultants: Vascular surgery/GI  Procedures: None  Antimicrobials: Vancomycin and Zosyn from 04/06/2019 onwards   Subjective: Patient seen and examined at bedside.  He is awake and nods to some questions.  No overnight fever, vomiting reported.  Objective: Vitals:   04/14/19 1500 04/14/19 2222 04/15/19 0345 04/15/19 0355  BP: 115/72 126/79  127/65  Pulse:  82  84  Resp:    18  Temp:  98.2 F (36.8 C)  97.9 F (36.6 C)  TempSrc:  Oral  Oral  SpO2:  94%  95%  Weight:   81.6 kg   Height:        Intake/Output Summary (Last 24 hours) at 04/15/2019 1131 Last data filed at 04/15/2019 0400 Gross per 24 hour  Intake 1142.53 ml  Output 1300 ml  Net -157.47 ml   Filed Weights   04/12/19 0500 04/14/19 0406 04/15/19 0345  Weight: 73.4 kg 80.7 kg 81.6 kg    Examination:  General exam: Appears calm and comfortable.  Looks older than stated 58 age.  Nods head to some questions.  Does not answer questions. Respiratory system: Bilateral decreased breath sounds at bases Cardiovascular system: S1 & S2 heard, Rate controlled Gastrointestinal system: Abdomen is nondistended, soft and nontender. Normal bowel sounds heard. Extremities: No cyanosis, clubbing; trace edema.  Left leg dressing present.   Data Reviewed: I have personally reviewed following labs and imaging studies  CBC: Recent Labs  Lab  04/11/19 0409 04/12/19 0420 04/13/19 0548 04/14/19 0443 04/15/19 0501  WBC 11.2* 11.8* 13.7* 13.5* 14.3*  NEUTROABS  --  8.4* 9.8* 9.9* 11.0*  HGB 10.0* 9.8* 10.4* 10.0* 9.7*  HCT 30.8* 29.6* 31.4* 31.3* 31.2*  MCV 81.1 78.9* 78.9* 80.5  82.8  PLT 398 393 371 330 [id number]   Basic Metabolic Panel: Recent Labs  Lab 04/11/19 0409 04/12/19 0420 04/13/19 0548 04/14/19 0443 04/15/19 0501  NA 136 139 139 140 137  K 4.1 3.5 3.0* 3.9 3.5  CL 107 103 100 97* 98  CO2 13* 21* 27 30 27   GLUCOSE 467* 339* 128* 231* 161*  BUN 41* 34* 24* 19 14  CREATININE 2.28* 2.39* 1.98* 2.02* 2.00*  CALCIUM 8.3* 8.4* 8.0* 7.8* 7.7*  MG  --   --  1.7 2.2 1.8   GFR: Estimated Creatinine Clearance: 42.1 mL/min (A) (by C-G formula based on SCr of 2 mg/dL (H)). Liver Function Tests: Recent Labs  Lab 04/14/19 0715  AST 42*  ALT 19  ALKPHOS 87  BILITOT 0.9  PROT 6.1*  ALBUMIN 1.7*   No results for input(s): LIPASE, AMYLASE in the last 168 hours. No results for input(s): AMMONIA in the last 168 hours. Coagulation Profile: No results for input(s): INR, PROTIME in the last 168 hours. Cardiac Enzymes: No results for input(s): CKTOTAL, CKMB, CKMBINDEX, TROPONINI in the last 168 hours. BNP (last 3 results) No results for input(s): PROBNP in the last 8760 hours. HbA1C: No results for input(s): HGBA1C in the last 72 hours. CBG: Recent Labs  Lab 04/14/19 2024 04/14/19 2103 04/15/19 0008 04/15/19 0357 04/15/19 0743  GLUCAP 68* 117* 94 142* 332*   Lipid Profile: No results for input(s): CHOL, HDL, LDLCALC, TRIG, CHOLHDL, LDLDIRECT in the last 72 hours. Thyroid Function Tests: No results for input(s): TSH, T4TOTAL, FREET4, T3FREE, THYROIDAB in the last 72 hours. Anemia Panel: No results for input(s): VITAMINB12, FOLATE, FERRITIN, TIBC, IRON, RETICCTPCT in the last 72 hours. Sepsis Labs: No results for input(s): PROCALCITON, LATICACIDVEN in the last 168 hours.  Recent Results (from the past 240 hour(s))  SARS Coronavirus 2 [facility] order, Performed in [facility] hospital lab) Nasopharyngeal Nasopharyngeal Swab     Status: None   Collection Time: 04/20/2019  8:30 AM   Specimen: Nasopharyngeal Swab  Result Value Ref Range Status   SARS  Coronavirus 2 NEGATIVE NEGATIVE Final    Comment: (NOTE) If result is NEGATIVE SARS-CoV-2 target nucleic acids are NOT DETECTED. The SARS-CoV-2 RNA is generally detectable in upper and lower  respiratory specimens during the acute phase of infection. The lowest  concentration of SARS-CoV-2 viral copies this assay can detect is 250  copies / mL. A negative result does not preclude SARS-CoV-2 infection  and should not be used as the sole basis for treatment or other  patient management decisions.  A negative result may occur with  improper specimen collection / handling, submission of specimen other  than nasopharyngeal swab, presence of viral mutation(s) within the  areas targeted by this assay, and inadequate number of viral copies  (<250 copies / mL). A negative result must be combined with clinical  observations, patient history, and epidemiological information. If result is POSITIVE SARS-CoV-2 target nucleic acids are DETECTED. The SARS-CoV-2 RNA is generally detectable in upper and lower  respiratory specimens dur ing the acute phase of infection.  Positive  results are indicative of active infection with SARS-CoV-2.  Clinical  correlation with patient history and other diagnostic information is  necessary to determine patient infection status.  Positive results do  not rule out bacterial infection or co-infection with other viruses. If result is PRESUMPTIVE POSTIVE SARS-CoV-2 nucleic acids MAY BE PRESENT.   A presumptive positive result was obtained on the submitted specimen  and confirmed on repeat testing.  While 2019 novel coronavirus  (SARS-CoV-2) nucleic acids may be present in the submitted sample  additional confirmatory testing may be necessary for epidemiological  and / or clinical management purposes  to differentiate between  SARS-CoV-2 and other Sarbecovirus currently known to infect humans.  If clinically indicated additional testing with an alternate test    methodology [phone]) is advised. The SARS-CoV-2 RNA is generally  detectable in upper and lower respiratory sp ecimens during the acute  phase of infection. The expected result is Negative. Fact Sheet for Patients:  StrictlyIdeas.no Fact Sheet for Healthcare Providers: BankingDealers.co.za This test is not yet approved or cleared by the [place] FDA and has been authorized for detection and/or diagnosis of SARS-CoV-2 by FDA under an Emergency Use Authorization (EUA).  This EUA will remain in effect (meaning this test can be used) for the duration of the COVID-19 declaration under Section 564(b)(1) of the Act, 21 U.S.C. section 360bbb-3(b)(1), unless the authorization is terminated or revoked sooner. Performed at [facility], [place] [address] [place], [place] [address]   Culture, blood (routine x 2)     Status: None (Preliminary result)   Collection Time: 04/09/2019  2:30 PM   Specimen: BLOOD RIGHT ARM  Result Value Ref Range Status   Specimen Description BLOOD RIGHT ARM  Final   Special Requests   Final    BOTTLES DRAWN AEROBIC AND ANAEROBIC Blood Culture adequate volume   Culture   Final    NO GROWTH 4 DAYS Performed at [facility], [place] [address], [place], [place] [address]    Report Status PENDING  Incomplete  Culture, blood (routine x 2)     Status: None (Preliminary result)   Collection Time: 04/24/2019  3:02 PM   Specimen: BLOOD RIGHT ARM  Result Value Ref Range Status   Specimen Description BLOOD RIGHT ARM  Final   Special Requests AEROBIC BOTTLE ONLY Blood Culture adequate volume  Final   Culture   Final    NO GROWTH 4 DAYS Performed at [facility], [place] [address], [place], [place] [address]    Report Status PENDING  Incomplete  MRSA PCR Screening     Status: None   Collection Time: 04/11/19  5:00 AM   Specimen: Nasopharyngeal  Result Value Ref Range Status   MRSA by PCR NEGATIVE NEGATIVE Final     Comment:        The GeneXpert MRSA Assay (FDA approved for NASAL specimens only), is one component of a comprehensive MRSA colonization surveillance program. It is not intended to diagnose MRSA infection nor to guide or monitor treatment for MRSA infections. Performed at [facility], [place] [address] [place], [place] [address]   Culture, Urine     Status: None (Preliminary result)   Collection Time: 04/14/19  4:14 AM   Specimen: Urine, Random  Result Value Ref Range Status   Specimen Description URINE, RANDOM  Final   Special Requests NONE  Final   Culture   Final    CULTURE REINCUBATED FOR BETTER GROWTH Performed at [facility], [place] [address] [place], [place] [address]    Report Status PENDING  Incomplete  Surgical PCR screen     Status: None   Collection Time: 04/14/19  4:14 AM   Specimen: Urine, Suprapubic; Nasal Swab  Result Value Ref Range Status   MRSA, PCR NEGATIVE NEGATIVE Final   Staphylococcus aureus NEGATIVE NEGATIVE Final    Comment: (NOTE) The Xpert SA Assay (FDA approved for NASAL specimens in patients 55 years of age and older), is one component of a comprehensive surveillance program. It is not intended to diagnose infection nor to guide or monitor treatment. Performed at [facility], [place] [address], [place], [place] [address]          Radiology Studies: Dg Abd 1 View  Result Date: 04/15/2019 CLINICAL DATA:  Emesis EXAM: ABDOMEN - 1 VIEW COMPARISON:  None. FINDINGS: Scattered large and small bowel gas is noted. No abnormal mass or abnormal calcifications are seen. The degree of dilatation has improved in the interval from the prior exam. No free air is noted. IMPRESSION: Persistent large and small bowel gas although the degree of small bowel dilatation has improved in the interval. No free air is noted. Electronically Signed   By: Inez Catalina M.D.   On: 04/15/2019 09:18   Dg Abd 2 Views  Result Date: 04/14/2019 CLINICAL  DATA:  Nausea and vomiting today, no pain EXAM: ABDOMEN - 2 VIEW COMPARISON:  Chest radiograph 1 day prior FINDINGS: Diffuse air-filled bowel loops throughout the abdomen several of which measure greater than 3 cm. Air and stool projects over the rectal vault. No free intraperitoneal air. No suspicious calcifications overlying the urinary tract or gallbladder fossa. Vascular calcium is noted in the pelvis. The bones are diffusely demineralized. Degenerative changes are present in the lower lumbar spine and pelvic girdle. IMPRESSION: Diffuse air distended loops bowel throughout the abdomen could reflect developing ileus, obstruction is less favored given the diffuse appearance and air and stool projecting over the rectal vault. Electronically Signed   By: Lovena Le M.D.   On: 04/14/2019 14:33        Scheduled Meds:  amLODipine  10 mg Oral Daily   folic acid  1 mg Oral Daily   hydrALAZINE  25 mg Oral TID   insulin aspart  0-9 Units Subcutaneous TID WC   insulin aspart  3 Units Subcutaneous TID WC   insulin detemir  10 Units Subcutaneous Daily   levETIRAcetam  250 mg Oral BID   mirtazapine  15 mg Oral QHS   pantoprazole  40 mg Oral Q0600   sertraline  200 mg Oral Daily   sodium chloride flush  3 mL Intravenous Q12H   ziprasidone  20 mg Oral Daily   ziprasidone  40 mg Oral QHS   Continuous Infusions:  dextrose 5 % and 0.9% NaCl 50 mL/hr at 04/15/19 0400   piperacillin-tazobactam (ZOSYN)  IV 3.375 g (04/15/19 0842)   vancomycin Stopped (04/15/19 0257)     LOS: 3 days        Aline August, MD [facility] 04/15/2019, 11:31 AM

## 2019-04-15 NOTE — Progress Notes (Signed)
Initial Nutrition Assessment  RD working remotely.  DOCUMENTATION CODES:   Not applicable  INTERVENTION:   -RD will follow for diet advancement and adjust supplement regimen as appropriate -30 ml Prostat TID, each supplement provides 100 kcals and 15 grams protein -Boost Breeze po TID, each supplement provides 250 kcal and 9 grams of protein -1 packet Juven BID, each packet provides 95 calories, 2.5 grams of protein (collagen), and 9.8 grams of carbohydrate (3 grams sugar); also contains 7 grams of L-arginine and L-glutamine, 300 mg vitamin C, 15 mg vitamin E, 1.2 mcg vitamin B-12, 9.5 mg zinc, 200 mg calcium, and 1.5 g  Calcium Beta-hydroxy-Beta-methylbutyrate to support wound healing -MVI with minerals daily  NUTRITION DIAGNOSIS:   Increased nutrient needs related to wound healing as evidenced by estimated needs.  GOAL:   Patient will meet greater than or equal to 90% of their needs  MONITOR:   PO intake, Supplement acceptance, Diet advancement, Labs, Weight trends, Skin, I & O's  REASON FOR ASSESSMENT:   Low Braden    ASSESSMENT:   [name] is a 58 y.o. male with medical history significant of severe autism with elective mutism, schizoaffective disorder, diabetes mellitus type 2, chronic kidney disease, chronic suprapubic catheter for urinary strictures, who presented for CT angiogram of the left leg with Dr. Bobette Mo for nonhealing ulcer.  However, patient was found to have hemoglobin 7.8 for which the procedure was canceled.  Social worker is present at bedside and provides history as the patient is unable to provide his own.  At baseline patient is wheelchair-bound.  The wound first appeared a couple months ago.  They were not able to get into the wound care center until June 23.  He underwent surgical debridement on June 25.  During that time he was noted to have dropping hemoglobin for which he was referred to Dr. Lowanda Foster of nephrology.  Hemoglobin noted to drop  to 6.8, and Dr. Lowanda Foster started him on iron infusions.  His first infusion given on July 7.  Subsequently, patient was admitted to [facility] on the 9th for a urinary tract infection, and on July 10 was transfused 2 units of blood as his hemoglobin was down to 6.3.  Stool guaiacs were reported to be negative at that time.  Patient received the second iron fusion on the 30th, but had not been formally seen by Dr. Lowanda Foster.  Pt admitted with acute on chronic acute blood loss and gangrene of lt heel.   Reviewed I/O's: -368 ml x 24 hours and +3 L since admission  UOP: 1.8 L since admission  Per CSW notes, pt is a long term resident of [facility] and is a ward of the state. He is wheelchair bound at baseline. Pt with autism and non-verbal, but able to communicate using head nods, gestures, and through writing.   Per [facility] notes, pt has been evaluated as an outpatient and not a candidate for revascularization. Wound has progressed significantly and not salvageable; recommending lt AKA. Plan for lt AKA tomorrow, 04/26/2019. Pt very anxious regarding procedure.   Pt on clear liquid diet; noted pt with nausea and vomiting last night. Per pt's case worker, this often occurs when he is nervous. Noted meal completion 15-75%.   Reviewed wt hx; noted no wt loss, however, last documented wt prior to this admission recorded in 02/2012.   Pt will be unable to meet needs on a clear liquid diet; recommend diet advancement once nausea and vomiting resolves.  Pt would also greatly benefit from nutritional supplements given variable PO intake.   Medications reviewed and include remeron.    Lab Results  Component Value Date   HGBA1C 6.7 (H) 04/11/2019   PTA DM medications are 10 units insulin detemir daily, 15 units insulin NPH-insulin regular at breakfast, and 10 units insulin regular for blood sugars >400.   Labs reviewed: CBGS: 94-142 (inpatient orders for glycemic control are 0-9 units insulin  aspart TID with meals, 3 units insulin aspart TID with meals, and 10 units insulin detemir daily).   Diet Order:   Diet Order            Diet clear liquid Room service appropriate? Yes; Fluid consistency: Thin  Diet effective now              EDUCATION NEEDS:   No education needs have been identified at this time  Skin:  Skin Assessment: Skin Integrity Issues: Skin Integrity Issues:: Stage II, Diabetic Ulcer Stage II: sacrum, scrotum Diabetic Ulcer: lt heel  Last BM:  04/15/19  Height:   Ht Readings from Last 1 Encounters:  04/15/2019 5\' 10"  (1.778 m)    Weight:   Wt Readings from Last 1 Encounters:  04/15/19 81.6 kg    Ideal Body Weight:  75.5 kg  BMI:  Body mass index is 25.83 kg/m.  Estimated Nutritional Needs:   Kcal:  2200-2400  Protein:  110-125 grams  Fluid:  > 2.2 L    Sharica Roedel A. Jimmye Norman, RD, LDN, [place] Registered Dietitian II Certified Diabetes Care and Education Specialist Pager: [phone] After hours Pager: [phone]

## 2019-04-16 ENCOUNTER — Encounter (HOSPITAL_COMMUNITY): Admission: RE | Disposition: E | Payer: Self-pay | Source: Ambulatory Visit | Attending: Internal Medicine

## 2019-04-16 LAB — CBC WITH DIFFERENTIAL/PLATELET
Abs Immature Granulocytes: 0.08 10*3/uL — ABNORMAL HIGH (ref 0.00–0.07)
Basophils Absolute: 0.1 10*3/uL (ref 0.0–0.1)
Basophils Relative: 1 %
Eosinophils Absolute: 0.7 10*3/uL — ABNORMAL HIGH (ref 0.0–0.5)
Eosinophils Relative: 6 %
HCT: 31.3 % — ABNORMAL LOW (ref 39.0–52.0)
Hemoglobin: 9.8 g/dL — ABNORMAL LOW (ref 13.0–17.0)
Immature Granulocytes: 1 %
Lymphocytes Relative: 11 %
Lymphs Abs: 1.4 10*3/uL (ref 0.7–4.0)
MCH: 25.8 pg — ABNORMAL LOW (ref 26.0–34.0)
MCHC: 31.3 g/dL (ref 30.0–36.0)
MCV: 82.4 fL (ref 80.0–100.0)
Monocytes Absolute: 1 10*3/uL (ref 0.1–1.0)
Monocytes Relative: 8 %
Neutro Abs: 9.3 10*3/uL — ABNORMAL HIGH (ref 1.7–7.7)
Neutrophils Relative %: 73 %
Platelets: 312 10*3/uL (ref 150–400)
RBC: 3.8 MIL/uL — ABNORMAL LOW (ref 4.22–5.81)
RDW: 17.4 % — ABNORMAL HIGH (ref 11.5–15.5)
WBC: 12.5 10*3/uL — ABNORMAL HIGH (ref 4.0–10.5)
nRBC: 0 % (ref 0.0–0.2)

## 2019-04-16 LAB — BASIC METABOLIC PANEL
Anion gap: 10 (ref 5–15)
BUN: 13 mg/dL (ref 6–20)
CO2: 27 mmol/L (ref 22–32)
Calcium: 7.8 mg/dL — ABNORMAL LOW (ref 8.9–10.3)
Chloride: 101 mmol/L (ref 98–111)
Creatinine, Ser: 2.01 mg/dL — ABNORMAL HIGH (ref 0.61–1.24)
GFR calc Af Amer: 41 mL/min — ABNORMAL LOW (ref >60)
GFR calc non Af Amer: 36 mL/min — ABNORMAL LOW (ref >60)
Glucose, Bld: 57 mg/dL — ABNORMAL LOW (ref 70–99)
Potassium: 2.9 mmol/L — ABNORMAL LOW (ref 3.5–5.1)
Sodium: 138 mmol/L (ref 135–145)

## 2019-04-16 LAB — MAGNESIUM: Magnesium: 1.8 mg/dL (ref 1.7–2.4)

## 2019-04-16 LAB — GLUCOSE, CAPILLARY
Glucose-Capillary: 93 mg/dL (ref 70–99)
Glucose-Capillary: 90 mg/dL (ref 70–99)
Glucose-Capillary: 188 mg/dL — ABNORMAL HIGH (ref 70–99)
Glucose-Capillary: 40 mg/dL — CL (ref 70–99)
Glucose-Capillary: 82 mg/dL (ref 70–99)

## 2019-04-16 LAB — VANCOMYCIN, TROUGH: Vancomycin Tr: 28 ug/mL (ref 15–20)

## 2019-04-16 SURGERY — AMPUTATION, ABOVE KNEE
Anesthesia: General | Laterality: Left

## 2019-04-16 MED ORDER — VANCOMYCIN HCL 10 G IV SOLR
1500.0000 mg | INTRAVENOUS | Status: DC
  Filled 2019-04-16: qty 1500

## 2019-04-16 NOTE — Progress Notes (Signed)
    Subjective  -   No acute events   Physical Exam:  Bilateral leg contractures and left heel ulcer unchanged Awake and alert Nonlabored respirations    Assessment/Plan:    Left heel ulcer: The patient was originally scheduled for above-knee amputation today however during preoperative discussions with the patient it was very clear that he was not in favor of proceeding.  This presents somewhat of a logistical problem given his mental issues.  I spoke with [name] the case manager for the patient yesterday and shared my concerns..  The patient clearly understands that we are recommending an above-knee amputation.  He is not in favor of doing this.  Because of that I canceled his procedure today.  We discussed this further this morning and he appears to be coming to grips with the fact that this is inevitable.  If he is amenable and we have legal permission to proceed, this could be performed next week.  [name] 04/12/2019 11:03 AM --  Vitals:   04/09/2019 0530 05/03/2019 0844  BP: 95/60 (!) 108/56  Pulse: 82   Resp: 18   Temp: (!) 97.5 F (36.4 C)   SpO2: 99%     Intake/Output Summary (Last 24 hours) at 04/30/2019 1103 Last data filed at 04/08/2019 0850 Gross per 24 hour  Intake 183.81 ml  Output 1900 ml  Net -1716.19 ml     Laboratory CBC    Component Value Date/Time   WBC 12.5 (H) 04/14/2019 0111   HGB 9.8 (L) 04/12/2019 0111   HCT 31.3 (L) 04/18/2019 0111   PLT 312 04/11/2019 0111    BMET    Component Value Date/Time   NA 138 04/08/2019 0111   K 2.9 (L) 04/24/2019 0111   CL 101 04/20/2019 0111   CO2 27 04/06/2019 0111   GLUCOSE 57 (L) 04/15/2019 0111   BUN 13 04/25/2019 0111   CREATININE 2.01 (H) 05/04/2019 0111   CALCIUM 7.8 (L) 05/01/2019 0111   GFRNONAA 36 (L) 04/23/2019 0111   GFRAA 41 (L) 04/17/2019 0111    COAG Lab Results  Component Value Date   INR 1.24 02/16/2012   INR 1.06 01/15/2011   No results found for: PTT   Antibiotics Anti-infectives (From admission, onward)   Start     Dose/Rate Route Frequency Ordered Stop   04/11/19 1700  vancomycin (VANCOCIN) IVPB 1000 mg/200 mL premix     1,000 mg 200 mL/hr over 60 Minutes Intravenous Every 24 hours 04/27/2019 1645     04/11/19 0100  piperacillin-tazobactam (ZOSYN) IVPB 3.375 g     3.375 g 12.5 mL/hr over 240 Minutes Intravenous Every 8 hours 04/21/2019 1641     04/27/2019 1615  vancomycin (VANCOCIN) IVPB 1000 mg/200 mL premix     1,000 mg 200 mL/hr over 60 Minutes Intravenous  Once 04/13/2019 1611 04/16/2019 2321   05/03/2019 1615  piperacillin-tazobactam (ZOSYN) IVPB 3.375 g     3.375 g 100 mL/hr over 30 Minutes Intravenous  Once 04/21/2019 1611 04/13/2019 1919       V. Leia Alf, M.D., [facility] [place] Office: [phone] Pager:  [phone]

## 2019-04-16 NOTE — Progress Notes (Addendum)
Pharmacy Antibiotic Note  [name] is a 58 y.o. male admitted on 05/02/2019 with gangrenous wound infection.  Pharmacy has been consulted for Vanco/Zosyn dosing. Plans note for BKA on 8/13.  -WBC= 13.7, afeb, SCr= 2, cultures- neg -vancomycin trough= 28   Vanco 8/7>> Zosyn 8/7>>   Plan: -Zosyn 3.375g IV q 8hr. -Change vancomycin to 1500mg  IV q48hr due to elevated trough (next dose at 2pm on 8/14) -Will follow renal function, cultures and clinical progress   Height: 5\' 10"  (177.8 cm) Weight: 173 lb (78.5 kg) IBW/kg (Calculated) : 73  Temp (24hrs), Avg:97.7 F (36.5 C), Min:97.5 F (36.4 C), Max:97.8 F (36.6 C)  Recent Labs  Lab 04/12/19 0420 04/13/19 0548 04/14/19 0443 04/15/19 0501 04/29/2019 0111 04/27/2019 0128  WBC 11.8* 13.7* 13.5* 14.3* 12.5*  --   CREATININE 2.39* 1.98* 2.02* 2.00* 2.01*  --   VANCOTROUGH  --   --   --   --   --  28*    Estimated Creatinine Clearance: 41.9 mL/min (A) (by C-G formula based on SCr of 2.01 mg/dL (H)).    No Known Allergies  Hildred Laser, PharmD Clinical Pharmacist **Pharmacist phone directory can now be found on [email or web address] ([email or web address]).  Listed under [place].

## 2019-04-16 NOTE — Progress Notes (Signed)
Patient ID: [name], male   DOB: 11-Jul-1961, 58 y.o.   MRN: [id number]  PROGRESS NOTE    [name]  [id number] DOB: 05-Jun-1961 DOA: 04/25/2019 PCP: Rosita Fire, MD   Brief Narrative:  58 y.o.malewith medical history significant ofsevere autism with elective mutism, schizoaffective disorder, diabetes mellitus type 2, chronic kidney disease, chronic suprapubic catheter for urinary strictures, who presented for CT angiogram of the left leg with Dr. Scot Dock for nonhealing ulcer. However, patient was found to have hemoglobin 7.8 for which the procedure was canceled.  He underwent surgical debridement on February 26, 2019 and was noted to have dropping hemoglobin.  Patient was recently found to have dropping hemoglobin for which Dr. Befekadu/nephrology had started iron infusions.  Assessment & Plan:   Left heel gangrene -Vascular surgery following.  Vascular surgery recommending AKA but patient is not sure yet. -Currently on vancomycin and Zosyn.  Leukocytosis -Probably from above.  Afebrile.  Cultures negative so far.  Continue vancomycin and Zosyn.  Iron deficiency anemia/anemia of chronic disease/folate deficiency -No overt signs of GI bleeding.  FOBT negative.  GI recommended outpatient GI evaluation and follow-up -Status post 2 units packed red cells transfusion since admission.  Hemoglobin stable currently.  Also status post IV Feraheme on 04/11/2019  -Folate levels low at 3.8.  Continue supplementation. -Vitamin B12 level at 1214 -Continue PPI  Acute kidney injury superimposed on chronic kidney disease stage III with history of chronic suprapubic catheter -Unclear baseline. -Currently stable at 2.01.  Monitor.  Hypokalemia -Replace.  Repeat a.m. labs  Hypertension -Continue amlodipine and hydralazine.  Diabetes mellitus type 2 with hyperglycemia and hypoglycemia -A1c 6.7.  Hypoglycemic last night again. -Oral meds on hold. -Continue CBGs with SSI.  DC  Levemir.  Schizoaffective disorder  -Stable.  Continue Zoloft, Geodon, Remeron  Autism/intellectual disability -Patient autistic at baseline and electively mute.  Patient nods his head or shakes his head to answer yes or no.  History of seizure disorder -No seizures noted.  Continue Keppra.   DVT prophylaxis: SCDs Code Status: Full Family Communication: None at bedside Disposition Plan: Depends on clinical outcome  Consultants: Vascular surgery/GI  Procedures: None  Antimicrobials: Vancomycin and Zosyn from 04/30/2019 onwards   Subjective: Patient seen and examined at bedside.  No overnight fever, vomiting reported.  Patient is awake but only nods his head to some questions. Objective: Vitals:   04/15/19 0345 04/15/19 0355 04/15/19 2033 04/27/2019 0530  BP:  127/65 (!) 162/67 95/60  Pulse:  84 (!) 104 82  Resp:  18  18  Temp:  97.9 F (36.6 C) 97.8 F (36.6 C) (!) 97.5 F (36.4 C)  TempSrc:  Oral Oral Oral  SpO2:  95%  99%  Weight: 81.6 kg   78.5 kg  Height:        Intake/Output Summary (Last 24 hours) at 04/25/2019 0750 Last data filed at 04/09/2019 0600 Gross per 24 hour  Intake 180.81 ml  Output 1900 ml  Net -1719.19 ml   Filed Weights   04/14/19 0406 04/15/19 0345 05/04/2019 0530  Weight: 80.7 kg 81.6 kg 78.5 kg    Examination:  General exam: No acute distress.  Looks older than stated age.  Nods head to some questions.  Does not answer questions. Respiratory system: Bilateral decreased breath sounds at bases.  No wheezing Cardiovascular system: Rate controlled, S1-S2 heard Gastrointestinal system: Abdomen is nondistended, soft and nontender. Normal bowel sounds heard. Extremities: No cyanosis; trace edema.  Left leg dressing present.  Data Reviewed: I have personally reviewed following labs and imaging studies  CBC: Recent Labs  Lab 04/12/19 0420 04/13/19 0548 04/14/19 0443 04/15/19 0501 04/24/2019 0111  WBC 11.8* 13.7* 13.5* 14.3* 12.5*   NEUTROABS 8.4* 9.8* 9.9* 11.0* 9.3*  HGB 9.8* 10.4* 10.0* 9.7* 9.8*  HCT 29.6* 31.4* 31.3* 31.2* 31.3*  MCV 78.9* 78.9* 80.5 82.8 82.4  PLT 393 371 330 355 [id number]   Basic Metabolic Panel: Recent Labs  Lab 04/12/19 0420 04/13/19 0548 04/14/19 0443 04/15/19 0501 04/25/2019 0111  NA 139 139 140 137 138  K 3.5 3.0* 3.9 3.5 2.9*  CL 103 100 97* 98 101  CO2 21* 27 30 27 27   GLUCOSE 339* 128* 231* 161* 57*  BUN 34* 24* 19 14 13   CREATININE 2.39* 1.98* 2.02* 2.00* 2.01*  CALCIUM 8.4* 8.0* 7.8* 7.7* 7.8*  MG  --  1.7 2.2 1.8 1.8   GFR: Estimated Creatinine Clearance: 41.9 mL/min (A) (by C-G formula based on SCr of 2.01 mg/dL (H)). Liver Function Tests: Recent Labs  Lab 04/14/19 0715  AST 42*  ALT 19  ALKPHOS 87  BILITOT 0.9  PROT 6.1*  ALBUMIN 1.7*   No results for input(s): LIPASE, AMYLASE in the last 168 hours. No results for input(s): AMMONIA in the last 168 hours. Coagulation Profile: No results for input(s): INR, PROTIME in the last 168 hours. Cardiac Enzymes: No results for input(s): CKTOTAL, CKMB, CKMBINDEX, TROPONINI in the last 168 hours. BNP (last 3 results) No results for input(s): PROBNP in the last 8760 hours. HbA1C: No results for input(s): HGBA1C in the last 72 hours. CBG: Recent Labs  Lab 04/15/19 0357 04/15/19 0743 04/15/19 1244 04/15/19 1802 04/15/19 2106  GLUCAP 142* 332* 76 129* 68*   Lipid Profile: No results for input(s): CHOL, HDL, LDLCALC, TRIG, CHOLHDL, LDLDIRECT in the last 72 hours. Thyroid Function Tests: No results for input(s): TSH, T4TOTAL, FREET4, T3FREE, THYROIDAB in the last 72 hours. Anemia Panel: No results for input(s): VITAMINB12, FOLATE, FERRITIN, TIBC, IRON, RETICCTPCT in the last 72 hours. Sepsis Labs: No results for input(s): PROCALCITON, LATICACIDVEN in the last 168 hours.  Recent Results (from the past 240 hour(s))  SARS Coronavirus 2 [facility] order, Performed in [facility] hospital lab) Nasopharyngeal Nasopharyngeal  Swab     Status: None   Collection Time: 04/17/2019  8:30 AM   Specimen: Nasopharyngeal Swab  Result Value Ref Range Status   SARS Coronavirus 2 NEGATIVE NEGATIVE Final    Comment: (NOTE) If result is NEGATIVE SARS-CoV-2 target nucleic acids are NOT DETECTED. The SARS-CoV-2 RNA is generally detectable in upper and lower  respiratory specimens during the acute phase of infection. The lowest  concentration of SARS-CoV-2 viral copies this assay can detect is 250  copies / mL. A negative result does not preclude SARS-CoV-2 infection  and should not be used as the sole basis for treatment or other  patient management decisions.  A negative result may occur with  improper specimen collection / handling, submission of specimen other  than nasopharyngeal swab, presence of viral mutation(s) within the  areas targeted by this assay, and inadequate number of viral copies  (<250 copies / mL). A negative result must be combined with clinical  observations, patient history, and epidemiological information. If result is POSITIVE SARS-CoV-2 target nucleic acids are DETECTED. The SARS-CoV-2 RNA is generally detectable in upper and lower  respiratory specimens dur ing the acute phase of infection.  Positive  results are indicative of active infection with SARS-CoV-2.  Clinical  correlation with patient history and other diagnostic information is  necessary to determine patient infection status.  Positive results do  not rule out bacterial infection or co-infection with other viruses. If result is PRESUMPTIVE POSTIVE SARS-CoV-2 nucleic acids MAY BE PRESENT.   A presumptive positive result was obtained on the submitted specimen  and confirmed on repeat testing.  While 2019 novel coronavirus  (SARS-CoV-2) nucleic acids may be present in the submitted sample  additional confirmatory testing may be necessary for epidemiological  and / or clinical management purposes  to differentiate between  SARS-CoV-2  and other Sarbecovirus currently known to infect humans.  If clinically indicated additional testing with an alternate test  methodology [phone]) is advised. The SARS-CoV-2 RNA is generally  detectable in upper and lower respiratory sp ecimens during the acute  phase of infection. The expected result is Negative. Fact Sheet for Patients:  StrictlyIdeas.no Fact Sheet for Healthcare Providers: BankingDealers.co.za This test is not yet approved or cleared by the [place] FDA and has been authorized for detection and/or diagnosis of SARS-CoV-2 by FDA under an Emergency Use Authorization (EUA).  This EUA will remain in effect (meaning this test can be used) for the duration of the COVID-19 declaration under Section 564(b)(1) of the Act, 21 U.S.C. section 360bbb-3(b)(1), unless the authorization is terminated or revoked sooner. Performed at [facility], [place] [address], [place], [place] [address]   Culture, blood (routine x 2)     Status: None   Collection Time: 04/25/2019  2:30 PM   Specimen: BLOOD RIGHT ARM  Result Value Ref Range Status   Specimen Description BLOOD RIGHT ARM  Final   Special Requests   Final    BOTTLES DRAWN AEROBIC AND ANAEROBIC Blood Culture adequate volume   Culture   Final    NO GROWTH 5 DAYS Performed at [facility], [place] [address], [place], [place] [address]    Report Status 04/15/2019 FINAL  Final  Culture, blood (routine x 2)     Status: None   Collection Time: 04/20/2019  3:02 PM   Specimen: BLOOD RIGHT ARM  Result Value Ref Range Status   Specimen Description BLOOD RIGHT ARM  Final   Special Requests AEROBIC BOTTLE ONLY Blood Culture adequate volume  Final   Culture   Final    NO GROWTH 5 DAYS Performed at [facility], [place] [address], [place], [place] [address]    Report Status 04/15/2019 FINAL  Final  MRSA PCR Screening     Status: None   Collection Time: 04/11/19  5:00 AM    Specimen: Nasopharyngeal  Result Value Ref Range Status   MRSA by PCR NEGATIVE NEGATIVE Final    Comment:        The GeneXpert MRSA Assay (FDA approved for NASAL specimens only), is one component of a comprehensive MRSA colonization surveillance program. It is not intended to diagnose MRSA infection nor to guide or monitor treatment for MRSA infections. Performed at [facility], [place] [address] [place], [place] [address]   Culture, Urine     Status: Abnormal   Collection Time: 04/14/19  4:14 AM   Specimen: Urine, Random  Result Value Ref Range Status   Specimen Description URINE, RANDOM  Final   Special Requests   Final    NONE Performed at [facility], [place] [address] [place], [place] [address]    Culture 80,000 COLONIES/mL YEAST (A)  Final  Report Status 04/15/2019 FINAL  Final  Surgical PCR screen     Status: None   Collection Time: 04/14/19  4:14 AM   Specimen: Urine, Suprapubic; Nasal Swab  Result Value Ref Range Status   MRSA, PCR NEGATIVE NEGATIVE Final   Staphylococcus aureus NEGATIVE NEGATIVE Final    Comment: (NOTE) The Xpert SA Assay (FDA approved for NASAL specimens in patients 59 years of age and older), is one component of a comprehensive surveillance program. It is not intended to diagnose infection nor to guide or monitor treatment. Performed at [facility], [place] [address] [place], [place] [address]          Radiology Studies: Dg Abd 1 View  Result Date: 04/15/2019 CLINICAL DATA:  Emesis EXAM: ABDOMEN - 1 VIEW COMPARISON:  None. FINDINGS: Scattered large and small bowel gas is noted. No abnormal mass or abnormal calcifications are seen. The degree of dilatation has improved in the interval from the prior exam. No free air is noted. IMPRESSION: Persistent large and small bowel gas although the degree of small bowel dilatation has improved in the interval. No free air is noted. Electronically Signed   By: Inez Catalina M.D.   On:  04/15/2019 09:18   Dg Abd 2 Views  Result Date: 04/14/2019 CLINICAL DATA:  Nausea and vomiting today, no pain EXAM: ABDOMEN - 2 VIEW COMPARISON:  Chest radiograph 1 day prior FINDINGS: Diffuse air-filled bowel loops throughout the abdomen several of which measure greater than 3 cm. Air and stool projects over the rectal vault. No free intraperitoneal air. No suspicious calcifications overlying the urinary tract or gallbladder fossa. Vascular calcium is noted in the pelvis. The bones are diffusely demineralized. Degenerative changes are present in the lower lumbar spine and pelvic girdle. IMPRESSION: Diffuse air distended loops bowel throughout the abdomen could reflect developing ileus, obstruction is less favored given the diffuse appearance and air and stool projecting over the rectal vault. Electronically Signed   By: Lovena Le M.D.   On: 04/14/2019 14:33        Scheduled Meds:  amLODipine  10 mg Oral Daily   feeding supplement  1 Container Oral TID BM   feeding supplement (PRO-STAT SUGAR FREE 64)  30 mL Oral TID WC   folic acid  1 mg Oral Daily   hydrALAZINE  25 mg Oral TID   insulin aspart  0-9 Units Subcutaneous TID WC   insulin aspart  3 Units Subcutaneous TID WC   insulin detemir  10 Units Subcutaneous Daily   levETIRAcetam  250 mg Oral BID   mirtazapine  15 mg Oral QHS   multivitamin with minerals  1 tablet Oral Daily   nutrition supplement (JUVEN)  1 packet Oral BID BM   pantoprazole  40 mg Oral Q0600   sertraline  200 mg Oral Daily   sodium chloride flush  3 mL Intravenous Q12H   ziprasidone  20 mg Oral Daily   ziprasidone  40 mg Oral QHS   Continuous Infusions:  piperacillin-tazobactam (ZOSYN)  IV 12.5 mL/hr at 04/24/2019 0200   vancomycin 200 mL/hr at 04/06/2019 0200     LOS: 4 days        Aline August, MD [facility] 04/05/2019, 7:50 AM

## 2019-04-17 LAB — MAGNESIUM: Magnesium: 1.8 mg/dL (ref 1.7–2.4)

## 2019-04-17 LAB — GLUCOSE, CAPILLARY
Glucose-Capillary: 288 mg/dL — ABNORMAL HIGH (ref 70–99)
Glucose-Capillary: 328 mg/dL — ABNORMAL HIGH (ref 70–99)
Glucose-Capillary: 396 mg/dL — ABNORMAL HIGH (ref 70–99)
Glucose-Capillary: 105 mg/dL — ABNORMAL HIGH (ref 70–99)
Glucose-Capillary: 59 mg/dL — ABNORMAL LOW (ref 70–99)
Glucose-Capillary: 248 mg/dL — ABNORMAL HIGH (ref 70–99)

## 2019-04-17 LAB — CBC WITH DIFFERENTIAL/PLATELET
Abs Immature Granulocytes: 0.11 10*3/uL — ABNORMAL HIGH (ref 0.00–0.07)
Basophils Absolute: 0.1 10*3/uL (ref 0.0–0.1)
Basophils Relative: 1 %
Eosinophils Absolute: 0.8 10*3/uL — ABNORMAL HIGH (ref 0.0–0.5)
Eosinophils Relative: 6 %
HCT: 29.7 % — ABNORMAL LOW (ref 39.0–52.0)
Hemoglobin: 9.3 g/dL — ABNORMAL LOW (ref 13.0–17.0)
Immature Granulocytes: 1 %
Lymphocytes Relative: 14 %
Lymphs Abs: 1.9 10*3/uL (ref 0.7–4.0)
MCH: 25.9 pg — ABNORMAL LOW (ref 26.0–34.0)
MCHC: 31.3 g/dL (ref 30.0–36.0)
MCV: 82.7 fL (ref 80.0–100.0)
Monocytes Absolute: 0.9 10*3/uL (ref 0.1–1.0)
Monocytes Relative: 7 %
Neutro Abs: 9.5 10*3/uL — ABNORMAL HIGH (ref 1.7–7.7)
Neutrophils Relative %: 71 %
Platelets: 321 10*3/uL (ref 150–400)
RBC: 3.59 MIL/uL — ABNORMAL LOW (ref 4.22–5.81)
RDW: 17.4 % — ABNORMAL HIGH (ref 11.5–15.5)
WBC: 13.3 10*3/uL — ABNORMAL HIGH (ref 4.0–10.5)
nRBC: 0 % (ref 0.0–0.2)

## 2019-04-17 LAB — BASIC METABOLIC PANEL
Anion gap: 12 (ref 5–15)
BUN: 21 mg/dL — ABNORMAL HIGH (ref 6–20)
CO2: 23 mmol/L (ref 22–32)
Calcium: 7.8 mg/dL — ABNORMAL LOW (ref 8.9–10.3)
Chloride: 101 mmol/L (ref 98–111)
Creatinine, Ser: 2.18 mg/dL — ABNORMAL HIGH (ref 0.61–1.24)
GFR calc Af Amer: 38 mL/min — ABNORMAL LOW (ref >60)
GFR calc non Af Amer: 32 mL/min — ABNORMAL LOW (ref >60)
Glucose, Bld: 239 mg/dL — ABNORMAL HIGH (ref 70–99)
Potassium: 3.2 mmol/L — ABNORMAL LOW (ref 3.5–5.1)
Sodium: 136 mmol/L (ref 135–145)

## 2019-04-17 MED ORDER — VANCOMYCIN HCL 10 G IV SOLR
1250.0000 mg | INTRAVENOUS | Status: DC
  Filled 2019-04-17: qty 1250

## 2019-04-17 MED ORDER — INSULIN ASPART 100 UNIT/ML ~~LOC~~ SOLN
3.0000 [IU] | Freq: Three times a day (TID) | SUBCUTANEOUS | Status: DC
  Administered 2019-04-17 (×2): 3 [IU] via SUBCUTANEOUS

## 2019-04-17 MED ORDER — SODIUM CHLORIDE 0.9 % IV SOLN
2.0000 g | INTRAVENOUS | Status: DC
  Administered 2019-04-17 – 2019-04-21 (×5): 2 g via INTRAVENOUS
  Filled 2019-04-17 (×3): qty 2
  Filled 2019-04-17 (×2): qty 20
  Filled 2019-04-17 (×2): qty 2

## 2019-04-17 MED ORDER — POTASSIUM CHLORIDE CRYS ER 20 MEQ PO TBCR
40.0000 meq | EXTENDED_RELEASE_TABLET | ORAL | Status: AC
  Administered 2019-04-17 (×2): 40 meq via ORAL
  Filled 2019-04-17 (×2): qty 2

## 2019-04-17 NOTE — Progress Notes (Signed)
Physical Therapy Treatment Patient Details Name: [name] MRN: [id number] DOB: [birth year] Today's Date: 04/17/2019    History of Present Illness Pt is a 58 y/o male who presents with anemia and L heel wound, found to be gangrenous. Pt was assessed by vascular surgery and it appears an AKA is planned for next week (week of 8/10). PMH significant for autism with elective mutism, schizophrenic disorder, DM, suprapubic catheter.    PT Comments    Patient participating slightly more with limited mobility to EOB with increased time given and mod cues.  Still self limiting with some perineal pain and emotions due to needing amputation.  Will need SNF level rehab upon d/c.     Follow Up Recommendations  SNF;Supervision/Assistance - 24 hour     Equipment Recommendations  None recommended by PT    Recommendations for Other Services       Precautions / Restrictions Precautions Precautions: Fall Precaution Comments: Elective mutism, suprapubic catheter, skin breakdown in perineal area.    Mobility  Bed Mobility Overal bed mobility: Needs Assistance Bed Mobility: Rolling;Supine to Sit;Sit to Supine Rolling: Mod assist;+2 for physical assistance   Supine to sit: Mod assist Sit to supine: Mod assist   General bed mobility comments: rolled in bed for hygiene as soiled with BM with techs in to assist.  Patient slow to respond, but able to assist to sit up on EOB.  Sat about 6 minutes, then leaning down on elbow and emotional when asked to perform seated therex so assisted legs into bed, bed in trendelenberg pt min A to scoot to HOB max cues.  Transfers                    Ambulation/Gait                 Stairs             Wheelchair Mobility    Modified Rankin (Stroke Patients Only)       Balance Overall balance assessment: Needs assistance Sitting-balance support: Feet supported;Bilateral upper extremity supported Sitting balance-Leahy Scale:  Good Sitting balance - Comments: UE support for balance Postural control: Right lateral lean                                  Cognition Arousal/Alertness: Awake/alert Behavior During Therapy: WFL for tasks assessed/performed Overall Cognitive Status: History of cognitive impairments - at baseline                                 General Comments: emotional and agrees due to issues with L foot though denies pain      Exercises Other Exercises Other Exercises: attempted seated LAQ, but pt tearful and limited participation; noted he was able to extend his legs almost all the way stretching on his own in the bed after awakened    General Comments General comments (skin integrity, edema, etc.): skin breakdown noted during perineal hygiene with barrier cream applied to scrotum and buttock/rectal area      Pertinent Vitals/Pain Pain Assessment: Faces Faces Pain Scale: Hurts little more Pain Location: generalized with movement, but not localizing even to L foot Pain Descriptors / Indicators: Grimacing Pain Intervention(s): Limited activity within patient's tolerance;Monitored during session;Repositioned    Home Living  Prior Function            PT Goals (current goals can now be found in the care plan section) Progress towards PT goals: Progressing toward goals    Frequency           PT Plan Current plan remains appropriate    Co-evaluation              AM-PAC PT "6 Clicks" Mobility   Outcome Measure  Help needed turning from your back to your side while in a flat bed without using bedrails?: A Lot Help needed moving from lying on your back to sitting on the side of a flat bed without using bedrails?: A Lot Help needed moving to and from a bed to a chair (including a wheelchair)?: Total Help needed standing up from a chair using your arms (e.g., wheelchair or bedside chair)?: Total Help needed to walk in  hospital room?: Total Help needed climbing 3-5 steps with a railing? : Total 6 Click Score: 8    End of Session   Activity Tolerance: Other (comment)(self limited and emotional) Patient left: in bed;with call bell/phone within reach;with bed alarm set   PT Visit Diagnosis: Difficulty in walking, not elsewhere classified (R26.2);Muscle weakness (generalized) (M62.81)     Time: [id number] PT Time Calculation (min) (ACUTE ONLY): 25 min  Charges:  $Therapeutic Activity: 23-37 mins                     Magda Kiel, [place] [facility] [phone] 04/17/2019    Reginia Naas 04/17/2019, 11:10 AM

## 2019-04-17 NOTE — Progress Notes (Signed)
Hypoglycemic Event  CBG: 40 Treatment: 4 oz juice/soda  Symptoms: None  Follow-up CBG: Time:2216 CBG Result:82  Possible Reasons for Event: Inadequate meal intake  Comments/MD notified:    Brentton Wardlow Sison

## 2019-04-17 NOTE — Progress Notes (Signed)
Patient ID: [name], male   DOB: Mar 20, 1961, 58 y.o.   MRN: [id number]  PROGRESS NOTE    [name]  [id number] DOB: September 02, 1961 DOA: 04/12/2019 PCP: Rosita Fire, MD   Brief Narrative:  58 y.o.malewith medical history significant ofsevere autism with elective mutism, schizoaffective disorder, diabetes mellitus type 2, chronic kidney disease, chronic suprapubic catheter for urinary strictures, who presented for CT angiogram of the left leg with Dr. Scot Dock for nonhealing ulcer. However, patient was found to have hemoglobin 7.8 for which the procedure was canceled.  He underwent surgical debridement on February 26, 2019 and was noted to have dropping hemoglobin.  Patient was recently found to have dropping hemoglobin for which Dr. Befekadu/nephrology had started iron infusions.  Assessment & Plan:   Left heel gangrene -Vascular surgery following.  Vascular surgery recommending AKA but patient is not sure yet.  We will continue to monitor inpatient and continue to try and convince the patient regarding the importance of AKA.  If patient still not tolerating, might have to consider palliative care consultation. -Currently on vancomycin and Zosyn.  Leukocytosis -Probably from above.  Afebrile.  Cultures negative so far.  Continue vancomycin and Zosyn.  Iron deficiency anemia/anemia of chronic disease/folate deficiency -No overt signs of GI bleeding.  FOBT negative.  GI recommended outpatient GI evaluation and follow-up -Status post 2 units packed red cells transfusion since admission.  Hemoglobin stable currently.  Also status post IV Feraheme on 04/11/2019  -Folate levels low at 3.8.  Continue supplementation. -Vitamin B12 level at 1214 -Continue PPI  Acute kidney injury superimposed on chronic kidney disease stage III with history of chronic suprapubic catheter -Unclear baseline. -Creatinine slightly rising at 2.18 today.  Will monitor.  If continues to rise, will DC  vancomycin.  Hypokalemia -Replace.  Repeat a.m. labs  Hypertension -Continue amlodipine and hydralazine.  Diabetes mellitus type 2 with hyperglycemia and hypoglycemia -A1c 6.7.  Hypoglycemic last night again. -Oral meds on hold. -Continue CBGs with SSI.  Levemir has been discontinued.   Schizoaffective disorder  -Stable.  Continue Zoloft, Geodon, Remeron  Autism/intellectual disability -Patient autistic at baseline and electively mute.  Patient nods his head or shakes his head to answer yes or no.  History of seizure disorder -No seizures noted.  Continue Keppra.   DVT prophylaxis: SCDs Code Status: Full Family Communication: None at bedside Disposition Plan: Depends on clinical outcome  Consultants: Vascular surgery/GI  Procedures: None  Antimicrobials: Vancomycin and Zosyn from 04/25/2019 onwards   Subjective: Patient seen and examined at bedside.  Patient is awake but only nods his head to some questions.  As per nursing staff, had hypoglycemic event overnight.  No overnight fever or vomiting.   Objective: Vitals:   04/14/2019 1903 04/05/2019 2052 04/04/2019 2232 04/17/19 0526  BP: 117/60 119/60 127/60 112/64  Pulse: 89 86  80  Resp: 18 20  18   Temp: 98.3 F (36.8 C) (!) 97.5 F (36.4 C)  97.8 F (36.6 C)  TempSrc: Oral Oral    SpO2: 99% 100%  99%  Weight:    79.4 kg  Height:        Intake/Output Summary (Last 24 hours) at 04/17/2019 0734 Last data filed at 04/17/2019 0544 Gross per 24 hour  Intake 1234.59 ml  Output 825 ml  Net 409.59 ml   Filed Weights   04/15/19 0345 04/12/2019 0530 04/17/19 0526  Weight: 81.6 kg 78.5 kg 79.4 kg    Examination:  General exam: No distress.  Looks older than  stated age.  Nods head to some questions.  Respiratory system: Bilateral decreased breath sounds at bases with some scattered crackles Cardiovascular system: S1-S2 heard, rate controlled Gastrointestinal system: Abdomen is nondistended, soft and nontender. Normal  bowel sounds heard. Extremities: No cyanosis; trace edema.  Left leg dressing present.   Data Reviewed: I have personally reviewed following labs and imaging studies  CBC: Recent Labs  Lab 04/13/19 0548 04/14/19 0443 04/15/19 0501 04/26/2019 0111 04/17/19 0436  WBC 13.7* 13.5* 14.3* 12.5* 13.3*  NEUTROABS 9.8* 9.9* 11.0* 9.3* 9.5*  HGB 10.4* 10.0* 9.7* 9.8* 9.3*  HCT 31.4* 31.3* 31.2* 31.3* 29.7*  MCV 78.9* 80.5 82.8 82.4 82.7  PLT 371 330 355 312 [id number]   Basic Metabolic Panel: Recent Labs  Lab 04/13/19 0548 04/14/19 0443 04/15/19 0501 04/11/2019 0111 04/17/19 0436  NA 139 140 137 138 136  K 3.0* 3.9 3.5 2.9* 3.2*  CL 100 97* 98 101 101  CO2 27 30 27 27 23   GLUCOSE 128* 231* 161* 57* 239*  BUN 24* 19 14 13  21*  CREATININE 1.98* 2.02* 2.00* 2.01* 2.18*  CALCIUM 8.0* 7.8* 7.7* 7.8* 7.8*  MG 1.7 2.2 1.8 1.8 1.8   GFR: Estimated Creatinine Clearance: 38.6 mL/min (A) (by C-G formula based on SCr of 2.18 mg/dL (H)). Liver Function Tests: Recent Labs  Lab 04/14/19 0715  AST 42*  ALT 19  ALKPHOS 87  BILITOT 0.9  PROT 6.1*  ALBUMIN 1.7*   No results for input(s): LIPASE, AMYLASE in the last 168 hours. No results for input(s): AMMONIA in the last 168 hours. Coagulation Profile: No results for input(s): INR, PROTIME in the last 168 hours. Cardiac Enzymes: No results for input(s): CKTOTAL, CKMB, CKMBINDEX, TROPONINI in the last 168 hours. BNP (last 3 results) No results for input(s): PROBNP in the last 8760 hours. HbA1C: No results for input(s): HGBA1C in the last 72 hours. CBG: Recent Labs  Lab 04/18/2019 0757 04/10/2019 1128 04/18/2019 1710 05/04/2019 2140 04/19/2019 2216  GLUCAP 93 90 188* 40* 82   Lipid Profile: No results for input(s): CHOL, HDL, LDLCALC, TRIG, CHOLHDL, LDLDIRECT in the last 72 hours. Thyroid Function Tests: No results for input(s): TSH, T4TOTAL, FREET4, T3FREE, THYROIDAB in the last 72 hours. Anemia Panel: No results for input(s): VITAMINB12,  FOLATE, FERRITIN, TIBC, IRON, RETICCTPCT in the last 72 hours. Sepsis Labs: No results for input(s): PROCALCITON, LATICACIDVEN in the last 168 hours.  Recent Results (from the past 240 hour(s))  SARS Coronavirus 2 [facility] order, Performed in [facility] hospital lab) Nasopharyngeal Nasopharyngeal Swab     Status: None   Collection Time: 04/21/2019  8:30 AM   Specimen: Nasopharyngeal Swab  Result Value Ref Range Status   SARS Coronavirus 2 NEGATIVE NEGATIVE Final    Comment: (NOTE) If result is NEGATIVE SARS-CoV-2 target nucleic acids are NOT DETECTED. The SARS-CoV-2 RNA is generally detectable in upper and lower  respiratory specimens during the acute phase of infection. The lowest  concentration of SARS-CoV-2 viral copies this assay can detect is 250  copies / mL. A negative result does not preclude SARS-CoV-2 infection  and should not be used as the sole basis for treatment or other  patient management decisions.  A negative result may occur with  improper specimen collection / handling, submission of specimen other  than nasopharyngeal swab, presence of viral mutation(s) within the  areas targeted by this assay, and inadequate number of viral copies  (<250 copies / mL). A negative result must be combined with  clinical  observations, patient history, and epidemiological information. If result is POSITIVE SARS-CoV-2 target nucleic acids are DETECTED. The SARS-CoV-2 RNA is generally detectable in upper and lower  respiratory specimens dur ing the acute phase of infection.  Positive  results are indicative of active infection with SARS-CoV-2.  Clinical  correlation with patient history and other diagnostic information is  necessary to determine patient infection status.  Positive results do  not rule out bacterial infection or co-infection with other viruses. If result is PRESUMPTIVE POSTIVE SARS-CoV-2 nucleic acids MAY BE PRESENT.   A presumptive positive result was obtained on the  submitted specimen  and confirmed on repeat testing.  While 2019 novel coronavirus  (SARS-CoV-2) nucleic acids may be present in the submitted sample  additional confirmatory testing may be necessary for epidemiological  and / or clinical management purposes  to differentiate between  SARS-CoV-2 and other Sarbecovirus currently known to infect humans.  If clinically indicated additional testing with an alternate test  methodology [phone]) is advised. The SARS-CoV-2 RNA is generally  detectable in upper and lower respiratory sp ecimens during the acute  phase of infection. The expected result is Negative. Fact Sheet for Patients:  StrictlyIdeas.no Fact Sheet for Healthcare Providers: BankingDealers.co.za This test is not yet approved or cleared by the [place] FDA and has been authorized for detection and/or diagnosis of SARS-CoV-2 by FDA under an Emergency Use Authorization (EUA).  This EUA will remain in effect (meaning this test can be used) for the duration of the COVID-19 declaration under Section 564(b)(1) of the Act, 21 U.S.C. section 360bbb-3(b)(1), unless the authorization is terminated or revoked sooner. Performed at [facility], [place] [address] [place], [place] [address]   Culture, blood (routine x 2)     Status: None   Collection Time: 04/23/2019  2:30 PM   Specimen: BLOOD RIGHT ARM  Result Value Ref Range Status   Specimen Description BLOOD RIGHT ARM  Final   Special Requests   Final    BOTTLES DRAWN AEROBIC AND ANAEROBIC Blood Culture adequate volume   Culture   Final    NO GROWTH 5 DAYS Performed at [facility], [place] [address], [place], [place] [address]    Report Status 04/15/2019 FINAL  Final  Culture, blood (routine x 2)     Status: None   Collection Time: 04/27/2019  3:02 PM   Specimen: BLOOD RIGHT ARM  Result Value Ref Range Status   Specimen Description BLOOD RIGHT ARM  Final   Special Requests  AEROBIC BOTTLE ONLY Blood Culture adequate volume  Final   Culture   Final    NO GROWTH 5 DAYS Performed at [facility], [place] [address], [place], [place] [address]    Report Status 04/15/2019 FINAL  Final  MRSA PCR Screening     Status: None   Collection Time: 04/11/19  5:00 AM   Specimen: Nasopharyngeal  Result Value Ref Range Status   MRSA by PCR NEGATIVE NEGATIVE Final    Comment:        The GeneXpert MRSA Assay (FDA approved for NASAL specimens only), is one component of a comprehensive MRSA colonization surveillance program. It is not intended to diagnose MRSA infection nor to guide or monitor treatment for MRSA infections. Performed at [facility], [place] [address], [place], [place] [address]   Culture, Urine     Status: Abnormal   Collection Time: 04/14/19  4:14 AM   Specimen: Urine, Random  Result Value Ref Range Status   Specimen Description URINE, RANDOM  Final   Special Requests   Final    NONE Performed at [facility], [address] [place], [place] [address]    Culture 80,000 COLONIES/mL YEAST (A)  Final   Report Status 04/15/2019 FINAL  Final  Surgical PCR screen     Status: None   Collection Time: 04/14/19  4:14 AM   Specimen: Urine, Suprapubic; Nasal Swab  Result Value Ref Range Status   MRSA, PCR NEGATIVE NEGATIVE Final   Staphylococcus aureus NEGATIVE NEGATIVE Final    Comment: (NOTE) The Xpert SA Assay (FDA approved for NASAL specimens in patients 14 years of age and older), is one component of a comprehensive surveillance program. It is not intended to diagnose infection nor to guide or monitor treatment. Performed at [facility], [place] [address], [place], [place] [address]          Radiology Studies: No results found.      Scheduled Meds: . amLODipine  10 mg Oral Daily  . feeding supplement  1 Container Oral TID BM  . feeding supplement (PRO-STAT SUGAR FREE 64)  30 mL Oral TID WC  . folic acid  1 mg Oral  Daily  . hydrALAZINE  25 mg Oral TID  . insulin aspart  0-9 Units Subcutaneous TID WC  . levETIRAcetam  250 mg Oral BID  . mirtazapine  15 mg Oral QHS  . multivitamin with minerals  1 tablet Oral Daily  . nutrition supplement (JUVEN)  1 packet Oral BID BM  . pantoprazole  40 mg Oral Q0600  . potassium chloride  40 mEq Oral Q4H  . sertraline  200 mg Oral Daily  . sodium chloride flush  3 mL Intravenous Q12H  . ziprasidone  20 mg Oral Daily  . ziprasidone  40 mg Oral QHS   Continuous Infusions: . piperacillin-tazobactam (ZOSYN)  IV Stopped (04/17/19 0544)  . vancomycin       LOS: 5 days        Aline August, MD [facility] 04/17/2019, 7:34 AM

## 2019-04-17 NOTE — Social Work (Signed)
CSW continuing to follow for support with disposition when medically appropriate.  Ieshia Hatcher, MSW, LCSWA [place] Clinical Social Work [phone]   

## 2019-04-17 NOTE — Progress Notes (Signed)
Pharmacy Antibiotic Note  [name] is a 58 y.o. male admitted on 04/23/2019 with gangrenous wound infection.  Pharmacy has been consulted for Vanco/Zosyn dosing. Plans note for BKA next week.  -WBC= 13.3 (up), afeb, SCr up to bit to 2.18. Cultures- neg -Vancomycin trough= 28 on 8/13  Vanco 8/7>> Zosyn 8/7>>   Plan: -Zosyn 3.375g IV q 8hr (4h infusion) -Adjust vancomycin to 1250mg  IV q48hr due to elevated trough and SCr (next dose at 2pm on 8/14) -Monitor clinical progress, c/s, renal function -F/u de-escalation plan/LOT, vancomycin levels as indicated   Height: 5\' 10"  (177.8 cm) Weight: 175 lb (79.4 kg) IBW/kg (Calculated) : 73  Temp (24hrs), Avg:97.9 F (36.6 C), Min:97.5 F (36.4 C), Max:98.3 F (36.8 C)  Recent Labs  Lab 04/13/19 0548 04/14/19 0443 04/15/19 0501 04/20/2019 0111 05/03/2019 0128 04/17/19 0436  WBC 13.7* 13.5* 14.3* 12.5*  --  13.3*  CREATININE 1.98* 2.02* 2.00* 2.01*  --  2.18*  VANCOTROUGH  --   --   --   --  28*  --     Estimated Creatinine Clearance: 38.6 mL/min (A) (by C-G formula based on SCr of 2.18 mg/dL (H)).    No Known Allergies  Elicia Lamp, PharmD, BCPS Please check AMION for all [place] contact numbers Clinical Pharmacist 04/17/2019 9:21 AM

## 2019-04-17 NOTE — TOC Progression Note (Addendum)
Transition of Care [facility]) - Progression Note    Patient Details  Name: Kiven Sherwin MRN: [id number] Date of Birth: 04/29/1961  Transition of Care [facility]) CM/SW [place], [place] Phone Number: 04/17/2019, 1:53 PM  Clinical Narrative:    Spoke with Betsey Amen at [facility].  We discussed care plan and she is open to referrals in [place], [place] and [place]. For any weekend needs call [phone] and ask for adult services supervisor. [facility] at this time unable to accept pt back due to [place], please do not share this with pt, pt guardian would like to discuss with him and this could be potentially distressing if someone else shares this with pt due to I/DD.   Expected Discharge Plan: [place] Barriers to Discharge: Continued Medical Work up  Expected Discharge Plan and Services Expected Discharge Plan: [place] In-house Referral: Clinical Social Work Post Acute Care Choice: [place] Living arrangements for the past 2 months: [place]   Social Determinants of Health (SDOH) Interventions    Readmission Risk Interventions No flowsheet data found.

## 2019-04-18 ENCOUNTER — Other Ambulatory Visit: Payer: Self-pay

## 2019-04-18 ENCOUNTER — Encounter (HOSPITAL_COMMUNITY): Payer: Self-pay

## 2019-04-18 LAB — CBC WITH DIFFERENTIAL/PLATELET
Abs Immature Granulocytes: 0.05 10*3/uL (ref 0.00–0.07)
Basophils Absolute: 0.1 10*3/uL (ref 0.0–0.1)
Basophils Relative: 1 %
Eosinophils Absolute: 0.6 10*3/uL — ABNORMAL HIGH (ref 0.0–0.5)
Eosinophils Relative: 5 %
HCT: 28.8 % — ABNORMAL LOW (ref 39.0–52.0)
Hemoglobin: 8.8 g/dL — ABNORMAL LOW (ref 13.0–17.0)
Immature Granulocytes: 0 %
Lymphocytes Relative: 16 %
Lymphs Abs: 2.1 10*3/uL (ref 0.7–4.0)
MCH: 26 pg (ref 26.0–34.0)
MCHC: 30.6 g/dL (ref 30.0–36.0)
MCV: 85 fL (ref 80.0–100.0)
Monocytes Absolute: 1.1 10*3/uL — ABNORMAL HIGH (ref 0.1–1.0)
Monocytes Relative: 8 %
Neutro Abs: 9.4 10*3/uL — ABNORMAL HIGH (ref 1.7–7.7)
Neutrophils Relative %: 70 %
Platelets: 339 10*3/uL (ref 150–400)
RBC: 3.39 MIL/uL — ABNORMAL LOW (ref 4.22–5.81)
RDW: 17.6 % — ABNORMAL HIGH (ref 11.5–15.5)
WBC: 13.5 10*3/uL — ABNORMAL HIGH (ref 4.0–10.5)
nRBC: 0 % (ref 0.0–0.2)

## 2019-04-18 LAB — GLUCOSE, CAPILLARY
Glucose-Capillary: 373 mg/dL — ABNORMAL HIGH (ref 70–99)
Glucose-Capillary: 383 mg/dL — ABNORMAL HIGH (ref 70–99)
Glucose-Capillary: 399 mg/dL — ABNORMAL HIGH (ref 70–99)
Glucose-Capillary: 444 mg/dL — ABNORMAL HIGH (ref 70–99)
Glucose-Capillary: 563 mg/dL (ref 70–99)
Glucose-Capillary: 58 mg/dL — ABNORMAL LOW (ref 70–99)
Glucose-Capillary: 74 mg/dL (ref 70–99)
Glucose-Capillary: 90 mg/dL (ref 70–99)

## 2019-04-18 LAB — BASIC METABOLIC PANEL
Anion gap: 10 (ref 5–15)
BUN: 31 mg/dL — ABNORMAL HIGH (ref 6–20)
CO2: 22 mmol/L (ref 22–32)
Calcium: 7.8 mg/dL — ABNORMAL LOW (ref 8.9–10.3)
Chloride: 104 mmol/L (ref 98–111)
Creatinine, Ser: 2.56 mg/dL — ABNORMAL HIGH (ref 0.61–1.24)
GFR calc Af Amer: 31 mL/min — ABNORMAL LOW (ref >60)
GFR calc non Af Amer: 27 mL/min — ABNORMAL LOW (ref >60)
Glucose, Bld: 387 mg/dL — ABNORMAL HIGH (ref 70–99)
Potassium: 3.2 mmol/L — ABNORMAL LOW (ref 3.5–5.1)
Sodium: 136 mmol/L (ref 135–145)

## 2019-04-18 LAB — MAGNESIUM: Magnesium: 1.8 mg/dL (ref 1.7–2.4)

## 2019-04-18 MED ORDER — POTASSIUM CHLORIDE IN NACL 40-0.9 MEQ/L-% IV SOLN
INTRAVENOUS | Status: DC
  Administered 2019-04-18 – 2019-04-19 (×2): 75 mL/h via INTRAVENOUS
  Filled 2019-04-18 (×2): qty 1000

## 2019-04-18 MED ORDER — INSULIN ASPART 100 UNIT/ML ~~LOC~~ SOLN: 5.0000 [IU] | Freq: Three times a day (TID) | SUBCUTANEOUS | Status: DC

## 2019-04-18 MED ORDER — INSULIN ASPART 100 UNIT/ML ~~LOC~~ SOLN
10.0000 [IU] | Freq: Once | SUBCUTANEOUS | Status: AC
  Administered 2019-04-18: 10 [IU] via SUBCUTANEOUS

## 2019-04-18 MED ORDER — INSULIN ASPART 100 UNIT/ML ~~LOC~~ SOLN
10.0000 [IU] | Freq: Once | SUBCUTANEOUS | Status: AC
  Administered 2019-04-18: 01:00:00 10 [IU] via SUBCUTANEOUS

## 2019-04-18 MED ORDER — POTASSIUM CHLORIDE CRYS ER 20 MEQ PO TBCR
40.0000 meq | EXTENDED_RELEASE_TABLET | Freq: Once | ORAL | Status: AC
  Administered 2019-04-18: 06:00:00 40 meq via ORAL
  Filled 2019-04-18: qty 2

## 2019-04-18 MED ORDER — INSULIN ASPART 100 UNIT/ML ~~LOC~~ SOLN
12.0000 [IU] | Freq: Once | SUBCUTANEOUS | Status: AC
  Administered 2019-04-18: 19:00:00 12 [IU] via SUBCUTANEOUS

## 2019-04-18 MED ORDER — WHITE PETROLATUM EX OINT
TOPICAL_OINTMENT | CUTANEOUS | Status: AC
  Filled 2019-04-18: qty 28.35

## 2019-04-18 MED ORDER — METRONIDAZOLE IN NACL 5-0.79 MG/ML-% IV SOLN
500.0000 mg | Freq: Three times a day (TID) | INTRAVENOUS | Status: DC
  Administered 2019-04-18 – 2019-04-19 (×3): 500 mg via INTRAVENOUS
  Filled 2019-04-18 (×4): qty 100

## 2019-04-18 MED ORDER — CHLORHEXIDINE GLUCONATE CLOTH 2 % EX PADS
6.0000 | MEDICATED_PAD | Freq: Every day | CUTANEOUS | Status: DC
  Administered 2019-04-18 – 2019-04-28 (×9): 6 via TOPICAL

## 2019-04-18 MED ORDER — INSULIN DETEMIR 100 UNIT/ML ~~LOC~~ SOLN
5.0000 [IU] | Freq: Every day | SUBCUTANEOUS | Status: DC
  Administered 2019-04-18: 5 [IU] via SUBCUTANEOUS
  Filled 2019-04-18 (×2): qty 0.05

## 2019-04-18 NOTE — Progress Notes (Signed)
[place]d notified of CBG 444. Ordered 12units

## 2019-04-18 NOTE — Progress Notes (Signed)
Hypoglycemic Event  CBG: 58  Treatment: 4oz orange juice Symptoms: no  Follow-up CBG: Time:0800 CBG Result:74  Possible Reasons for Event:pt's not eating well Comments/MD notified: Starla Link, MD.    [facility]  [name]

## 2019-04-18 NOTE — Progress Notes (Signed)
Patient ID: [name], male   DOB: [birth year], 58 y.o.   MRN: [id number]  PROGRESS NOTE    [name]  [id number] DOB: 04-Apr-1961 DOA: 04/05/2019 PCP: Rosita Fire, MD   Brief Narrative:  58 y.o.malewith medical history significant ofsevere autism with elective mutism, schizoaffective disorder, diabetes mellitus type 2, chronic kidney disease, chronic suprapubic catheter for urinary strictures, who presented for CT angiogram of the left leg with Dr. Scot Dock for nonhealing ulcer. However, patient was found to have hemoglobin 7.8 for which the procedure was canceled.  He underwent surgical debridement on February 26, 2019 and was noted to have dropping hemoglobin.  Patient was recently found to have dropping hemoglobin for which Dr. Befekadu/nephrology had started iron infusions.  Assessment & Plan:   Left heel gangrene -Vascular surgery following.  Vascular surgery recommending AKA but patient is not sure yet.  We will continue to monitor inpatient and continue to try and convince the patient regarding the importance of AKA.  If patient still not agreeable for the procedure, might have to consider palliative care consultation. -Currently on Rocephin.  vancomycin and Zosyn discontinued on 04/17/2019.  Add Flagyl.  Leukocytosis -Probably from above.  Afebrile.  Cultures negative so far.  Antibiotic plan as above.  Iron deficiency anemia/anemia of chronic disease/folate deficiency -No overt signs of GI bleeding.  FOBT negative.  GI recommended outpatient GI evaluation and follow-up -Status post 2 units packed red cells transfusion since admission.  Hemoglobin stable currently.  Also status post IV Feraheme on 04/11/2019  -Folate levels low at 3.8.  Continue supplementation. -Vitamin B12 level at 1214 -Continue PPI  Acute kidney injury superimposed on chronic kidney disease stage III with history of chronic suprapubic catheter -Unclear baseline. -Creatinine slightly rising at 2.56  today.  DC vancomycin.  Start IV fluids normal saline at 75 cc an hour.  Hypokalemia -Replace.  Repeat a.m. labs  Hypertension -Continue amlodipine and hydralazine.  Diabetes mellitus type 2 with hyperglycemia and hypoglycemia -A1c 6.7.  -Still has fluctuating hyperglycemia and hypoglycemia.  Blood sugar was 563 overnight and has dropped down to 90 this morning. -Oral meds on hold. -Continue CBGs with SSI.  We will put him back on low-dose of Levemir.  Schizoaffective disorder  -Stable.  Continue Zoloft, Geodon, Remeron  Autism/intellectual disability -Patient autistic at baseline and electively mute.  Patient nods his head or shakes his head to answer yes or no.  History of seizure disorder -No seizures noted.  Continue Keppra.   DVT prophylaxis: SCDs Code Status: Full Family Communication: None at bedside Disposition Plan: Depends on clinical outcome  Consultants: Vascular surgery/GI  Procedures: None  Antimicrobials: Vancomycin and Zosyn from 04/09/2019-04/10/2019 Rocephin from 04/17/2019 onwards   Subjective: Patient seen and examined at bedside.  Poor historian. Nods his head to some questions.  No overnight fever or vomiting.   Objective: Vitals:   04/17/19 0741 04/17/19 1454 04/17/19 2043 04/18/19 0609  BP: 121/67 116/60 116/65 111/61  Pulse:  94 84 76  Resp:      Temp:  98.2 F (36.8 C) 98.9 F (37.2 C) 98.5 F (36.9 C)  TempSrc:  Oral Oral Oral  SpO2:  99% 100% 100%  Weight:    78.5 kg  Height:        Intake/Output Summary (Last 24 hours) at 04/18/2019 0809 Last data filed at 04/18/2019 0500 Gross per 24 hour  Intake 732.46 ml  Output 1000 ml  Net -267.54 ml   Filed Weights   04/05/2019 0530 04/17/19  [id number] 04/18/19 0609  Weight: 78.5 kg 79.4 kg 78.5 kg    Examination:  General exam: No acute distress.  Looks older than stated age.  Nods head to some questions.  Respiratory system: Bilateral decreased breath sounds at bases with scattered crackles.   No wheezing  cardiovascular system: Rate controlled, S1-S2 heard Gastrointestinal system: Abdomen is nondistended, soft and nontender. Normal bowel sounds heard. Extremities:  trace edema.  Left leg dressing present.   Data Reviewed: I have personally reviewed following labs and imaging studies  CBC: Recent Labs  Lab 04/14/19 0443 04/15/19 0501 04/04/2019 0111 04/17/19 0436 04/18/19 0409  WBC 13.5* 14.3* 12.5* 13.3* 13.5*  NEUTROABS 9.9* 11.0* 9.3* 9.5* 9.4*  HGB 10.0* 9.7* 9.8* 9.3* 8.8*  HCT 31.3* 31.2* 31.3* 29.7* 28.8*  MCV 80.5 82.8 82.4 82.7 85.0  PLT 330 355 312 321 [id number]   Basic Metabolic Panel: Recent Labs  Lab 04/14/19 0443 04/15/19 0501 05/03/2019 0111 04/17/19 0436 04/18/19 0409  NA 140 137 138 136 136  K 3.9 3.5 2.9* 3.2* 3.2*  CL 97* 98 101 101 104  CO2 30 27 27 23 22   GLUCOSE 231* 161* 57* 239* 387*  BUN 19 14 13  21* 31*  CREATININE 2.02* 2.00* 2.01* 2.18* 2.56*  CALCIUM 7.8* 7.7* 7.8* 7.8* 7.8*  MG 2.2 1.8 1.8 1.8 1.8   GFR: Estimated Creatinine Clearance: 32.9 mL/min (A) (by C-G formula based on SCr of 2.56 mg/dL (H)). Liver Function Tests: Recent Labs  Lab 04/14/19 0715  AST 42*  ALT 19  ALKPHOS 87  BILITOT 0.9  PROT 6.1*  ALBUMIN 1.7*   No results for input(s): LIPASE, AMYLASE in the last 168 hours. No results for input(s): AMMONIA in the last 168 hours. Coagulation Profile: No results for input(s): INR, PROTIME in the last 168 hours. Cardiac Enzymes: No results for input(s): CKTOTAL, CKMB, CKMBINDEX, TROPONINI in the last 168 hours. BNP (last 3 results) No results for input(s): PROBNP in the last 8760 hours. HbA1C: No results for input(s): HGBA1C in the last 72 hours. CBG: Recent Labs  Lab 04/17/19 2120 04/17/19 2211 04/18/19 0053 04/18/19 0357 04/18/19 0624  GLUCAP 105* 248* 563* 399* 90   Lipid Profile: No results for input(s): CHOL, HDL, LDLCALC, TRIG, CHOLHDL, LDLDIRECT in the last 72 hours. Thyroid Function Tests: No  results for input(s): TSH, T4TOTAL, FREET4, T3FREE, THYROIDAB in the last 72 hours. Anemia Panel: No results for input(s): VITAMINB12, FOLATE, FERRITIN, TIBC, IRON, RETICCTPCT in the last 72 hours. Sepsis Labs: No results for input(s): PROCALCITON, LATICACIDVEN in the last 168 hours.  Recent Results (from the past 240 hour(s))  SARS Coronavirus 2 [facility] order, Performed in [facility] hospital lab) Nasopharyngeal Nasopharyngeal Swab     Status: None   Collection Time: 04/23/2019  8:30 AM   Specimen: Nasopharyngeal Swab  Result Value Ref Range Status   SARS Coronavirus 2 NEGATIVE NEGATIVE Final    Comment: (NOTE) If result is NEGATIVE SARS-CoV-2 target nucleic acids are NOT DETECTED. The SARS-CoV-2 RNA is generally detectable in upper and lower  respiratory specimens during the acute phase of infection. The lowest  concentration of SARS-CoV-2 viral copies this assay can detect is 250  copies / mL. A negative result does not preclude SARS-CoV-2 infection  and should not be used as the sole basis for treatment or other  patient management decisions.  A negative result may occur with  improper specimen collection / handling, submission of specimen other  than nasopharyngeal swab, presence of viral  mutation(s) within the  areas targeted by this assay, and inadequate number of viral copies  (<250 copies / mL). A negative result must be combined with clinical  observations, patient history, and epidemiological information. If result is POSITIVE SARS-CoV-2 target nucleic acids are DETECTED. The SARS-CoV-2 RNA is generally detectable in upper and lower  respiratory specimens dur ing the acute phase of infection.  Positive  results are indicative of active infection with SARS-CoV-2.  Clinical  correlation with patient history and other diagnostic information is  necessary to determine patient infection status.  Positive results do  not rule out bacterial infection or co-infection with other  viruses. If result is PRESUMPTIVE POSTIVE SARS-CoV-2 nucleic acids MAY BE PRESENT.   A presumptive positive result was obtained on the submitted specimen  and confirmed on repeat testing.  While 2019 novel coronavirus  (SARS-CoV-2) nucleic acids may be present in the submitted sample  additional confirmatory testing may be necessary for epidemiological  and / or clinical management purposes  to differentiate between  SARS-CoV-2 and other Sarbecovirus currently known to infect humans.  If clinically indicated additional testing with an alternate test  methodology [phone]) is advised. The SARS-CoV-2 RNA is generally  detectable in upper and lower respiratory sp ecimens during the acute  phase of infection. The expected result is Negative. Fact Sheet for Patients:  StrictlyIdeas.no Fact Sheet for Healthcare Providers: BankingDealers.co.za This test is not yet approved or cleared by the [place] FDA and has been authorized for detection and/or diagnosis of SARS-CoV-2 by FDA under an Emergency Use Authorization (EUA).  This EUA will remain in effect (meaning this test can be used) for the duration of the COVID-19 declaration under Section 564(b)(1) of the Act, 21 U.S.C. section 360bbb-3(b)(1), unless the authorization is terminated or revoked sooner. Performed at [facility], [place] [address], [place], [place] [address]   Culture, blood (routine x 2)     Status: None   Collection Time: 04/19/2019  2:30 PM   Specimen: BLOOD RIGHT ARM  Result Value Ref Range Status   Specimen Description BLOOD RIGHT ARM  Final   Special Requests   Final    BOTTLES DRAWN AEROBIC AND ANAEROBIC Blood Culture adequate volume   Culture   Final    NO GROWTH 5 DAYS Performed at [facility], [place] [address], [place], [place] [address]    Report Status 04/15/2019 FINAL  Final  Culture, blood (routine x 2)     Status: None   Collection Time:  04/19/2019  3:02 PM   Specimen: BLOOD RIGHT ARM  Result Value Ref Range Status   Specimen Description BLOOD RIGHT ARM  Final   Special Requests AEROBIC BOTTLE ONLY Blood Culture adequate volume  Final   Culture   Final    NO GROWTH 5 DAYS Performed at [facility], [place] [address] [address], [place], [place] [address]    Report Status 04/15/2019 FINAL  Final  MRSA PCR Screening     Status: None   Collection Time: 04/11/19  5:00 AM   Specimen: Nasopharyngeal  Result Value Ref Range Status   MRSA by PCR NEGATIVE NEGATIVE Final    Comment:        The GeneXpert MRSA Assay (FDA approved for NASAL specimens only), is one component of a comprehensive MRSA colonization surveillance program. It is not intended to diagnose MRSA infection nor to guide or monitor treatment for MRSA infections. Performed at [facility], [place]  [address] [place], [place] [address]   Culture, Urine     Status: Abnormal   Collection Time: 04/14/19  4:14 AM   Specimen: Urine, Random  Result Value Ref Range Status   Specimen Description URINE, RANDOM  Final   Special Requests   Final    NONE Performed at [facility], [place] [address] [place], [place] [address]    Culture 80,000 COLONIES/mL YEAST (A)  Final   Report Status 04/15/2019 FINAL  Final  Surgical PCR screen     Status: None   Collection Time: 04/14/19  4:14 AM   Specimen: Urine, Suprapubic; Nasal Swab  Result Value Ref Range Status   MRSA, PCR NEGATIVE NEGATIVE Final   Staphylococcus aureus NEGATIVE NEGATIVE Final    Comment: (NOTE) The Xpert SA Assay (FDA approved for NASAL specimens in patients 62 years of age and older), is one component of a comprehensive surveillance program. It is not intended to diagnose infection nor to guide or monitor treatment. Performed at [facility], [place] [address], [place], [place] [address]          Radiology Studies: No results found.      Scheduled Meds: . white petrolatum      .  amLODipine  10 mg Oral Daily  . Chlorhexidine Gluconate Cloth  6 each Topical Q0600  . feeding supplement  1 Container Oral TID BM  . feeding supplement (PRO-STAT SUGAR FREE 64)  30 mL Oral TID WC  . folic acid  1 mg Oral Daily  . hydrALAZINE  25 mg Oral TID  . insulin aspart  0-9 Units Subcutaneous TID WC  . insulin aspart  3 Units Subcutaneous TID WC  . levETIRAcetam  250 mg Oral BID  . mirtazapine  15 mg Oral QHS  . multivitamin with minerals  1 tablet Oral Daily  . nutrition supplement (JUVEN)  1 packet Oral BID BM  . pantoprazole  40 mg Oral Q0600  . sertraline  200 mg Oral Daily  . sodium chloride flush  3 mL Intravenous Q12H  . ziprasidone  20 mg Oral Daily  . ziprasidone  40 mg Oral QHS   Continuous Infusions: . cefTRIAXone (ROCEPHIN)  IV 2 g (04/17/19 1100)     LOS: 6 days        Aline August, MD [facility] 04/18/2019, 8:09 AM

## 2019-04-18 NOTE — Progress Notes (Signed)
[facility] notified of patients CBG=399. Awaiting new orders. Will continue to monitor.

## 2019-04-18 NOTE — Progress Notes (Signed)
CRITICAL VALUE ALERT  Critical Value:  CBG=563  Date & Time Notied:  04/18/2019 0100  Provider Notified: [facility]  Orders Received/Actions taken: 10 UNITS OF NOVALOG: RECHECK CBG @ 0400

## 2019-04-19 LAB — CBC WITH DIFFERENTIAL/PLATELET
Abs Immature Granulocytes: 0.05 10*3/uL (ref 0.00–0.07)
Basophils Absolute: 0.1 10*3/uL (ref 0.0–0.1)
Basophils Relative: 1 %
Eosinophils Absolute: 0.6 10*3/uL — ABNORMAL HIGH (ref 0.0–0.5)
Eosinophils Relative: 6 %
HCT: 29.4 % — ABNORMAL LOW (ref 39.0–52.0)
Hemoglobin: 9 g/dL — ABNORMAL LOW (ref 13.0–17.0)
Immature Granulocytes: 1 %
Lymphocytes Relative: 18 %
Lymphs Abs: 1.9 10*3/uL (ref 0.7–4.0)
MCH: 25.8 pg — ABNORMAL LOW (ref 26.0–34.0)
MCHC: 30.6 g/dL (ref 30.0–36.0)
MCV: 84.2 fL (ref 80.0–100.0)
Monocytes Absolute: 0.9 10*3/uL (ref 0.1–1.0)
Monocytes Relative: 9 %
Neutro Abs: 7.3 10*3/uL (ref 1.7–7.7)
Neutrophils Relative %: 65 %
Platelets: 336 10*3/uL (ref 150–400)
RBC: 3.49 MIL/uL — ABNORMAL LOW (ref 4.22–5.81)
RDW: 17.7 % — ABNORMAL HIGH (ref 11.5–15.5)
WBC: 10.9 10*3/uL — ABNORMAL HIGH (ref 4.0–10.5)
nRBC: 0.2 % (ref 0.0–0.2)

## 2019-04-19 LAB — GLUCOSE, CAPILLARY
Glucose-Capillary: 191 mg/dL — ABNORMAL HIGH (ref 70–99)
Glucose-Capillary: 213 mg/dL — ABNORMAL HIGH (ref 70–99)
Glucose-Capillary: 223 mg/dL — ABNORMAL HIGH (ref 70–99)
Glucose-Capillary: 292 mg/dL — ABNORMAL HIGH (ref 70–99)
Glucose-Capillary: 393 mg/dL — ABNORMAL HIGH (ref 70–99)

## 2019-04-19 LAB — COMPREHENSIVE METABOLIC PANEL
ALT: 41 U/L (ref 0–44)
AST: 93 U/L — ABNORMAL HIGH (ref 15–41)
Albumin: 1.8 g/dL — ABNORMAL LOW (ref 3.5–5.0)
Alkaline Phosphatase: 108 U/L (ref 38–126)
Anion gap: 11 (ref 5–15)
BUN: 34 mg/dL — ABNORMAL HIGH (ref 6–20)
CO2: 19 mmol/L — ABNORMAL LOW (ref 22–32)
Calcium: 7.9 mg/dL — ABNORMAL LOW (ref 8.9–10.3)
Chloride: 108 mmol/L (ref 98–111)
Creatinine, Ser: 2.32 mg/dL — ABNORMAL HIGH (ref 0.61–1.24)
GFR calc Af Amer: 35 mL/min — ABNORMAL LOW (ref >60)
GFR calc non Af Amer: 30 mL/min — ABNORMAL LOW (ref >60)
Glucose, Bld: 364 mg/dL — ABNORMAL HIGH (ref 70–99)
Potassium: 4.1 mmol/L (ref 3.5–5.1)
Sodium: 138 mmol/L (ref 135–145)
Total Bilirubin: 0.4 mg/dL (ref 0.3–1.2)
Total Protein: 6.1 g/dL — ABNORMAL LOW (ref 6.5–8.1)

## 2019-04-19 LAB — MAGNESIUM: Magnesium: 1.8 mg/dL (ref 1.7–2.4)

## 2019-04-19 LAB — C-REACTIVE PROTEIN: CRP: 17.8 mg/dL — ABNORMAL HIGH (ref <1.0)

## 2019-04-19 MED ORDER — METRONIDAZOLE 500 MG PO TABS
500.0000 mg | ORAL_TABLET | Freq: Three times a day (TID) | ORAL | Status: DC
  Administered 2019-04-19 – 2019-04-24 (×15): 500 mg via ORAL
  Filled 2019-04-19 (×15): qty 1

## 2019-04-19 MED ORDER — INSULIN DETEMIR 100 UNIT/ML ~~LOC~~ SOLN
5.0000 [IU] | Freq: Two times a day (BID) | SUBCUTANEOUS | Status: DC
  Administered 2019-04-19 (×2): 5 [IU] via SUBCUTANEOUS
  Filled 2019-04-19 (×4): qty 0.05

## 2019-04-19 MED ORDER — SODIUM CHLORIDE 0.9 % IV SOLN
INTRAVENOUS | Status: DC
  Administered 2019-04-19 – 2019-04-23 (×7): via INTRAVENOUS

## 2019-04-19 NOTE — Progress Notes (Signed)
Patient ID: [name], male   DOB: October 26, 1960, 58 y.o.   MRN: [id number]  PROGRESS NOTE    [name]  [id number] DOB: [birth year] DOA: 04/18/2019 PCP: Rosita Fire, MD   Brief Narrative:  58 y.o.malewith medical history significant ofsevere autism with elective mutism, schizoaffective disorder, diabetes mellitus type 2, chronic kidney disease, chronic suprapubic catheter for urinary strictures, who presented for CT angiogram of the left leg with Dr. Scot Dock for nonhealing ulcer. However, patient was found to have hemoglobin 7.8 for which the procedure was canceled.  He underwent surgical debridement on February 26, 2019 and was noted to have dropping hemoglobin.  Patient was recently found to have dropping hemoglobin for which Dr. Befekadu/nephrology had started iron infusions.  On admission, vascular surgery was consulted and patient was started on intravenous antibiotics.  Assessment & Plan:   Left heel gangrene -Vascular surgery following.  Vascular surgery recommending AKA but had not been sure yet.  He nodded his head when I brought the topic of amputation again this morning.  Will notify vascular surgery about the same. -Currently on Rocephin and Flagyl.  vancomycin and Zosyn discontinued on 04/17/2019.   -Currently afebrile. -CRP still 17.8.  Leukocytosis -Probably from above.  Resolved.  Iron deficiency anemia/anemia of chronic disease/folate deficiency -No overt signs of GI bleeding.  FOBT negative.  GI recommended outpatient GI evaluation and follow-up -Status post 2 units packed red cells transfusion since admission.  Hemoglobin stable currently.  Also status post IV Feraheme on 04/11/2019  -Folate levels low at 3.8.  Continue supplementation. -Vitamin B12 level at 1214 -Continue PPI  Acute kidney injury superimposed on chronic kidney disease stage III with history of chronic suprapubic catheter -Unclear baseline. -Creatinine improving, 2.3 today.  DC'd  vancomycin.  Continue normal saline.  Hypokalemia -Improved.  Hypertension -Continue amlodipine and hydralazine.  Diabetes mellitus type 2 with hyperglycemia and hypoglycemia -A1c 6.7.  -Still has fluctuating hyperglycemia and hypoglycemia.  -Oral meds on hold. -Continue CBGs with SSI.   -We will switch Levemir to 5 units twice a day.  Hypoalbuminemia -Follow nutrition recommendations.  Schizoaffective disorder  -Stable.  Continue Zoloft, Geodon, Remeron  Autism/intellectual disability -Patient autistic at baseline and electively mute.  Patient nods his head or shakes his head to answer yes or no.  History of seizure disorder -No seizures noted.  Continue Keppra.   DVT prophylaxis: SCDs Code Status: Full Family Communication: None at bedside Disposition Plan: Depends on clinical outcome  Consultants: Vascular surgery/GI  Procedures: None  Antimicrobials: Vancomycin and Zosyn from 04/21/2019-04/07/2019 Rocephin from 04/17/2019 onwards Flagyl from 04/18/2019 onwards   Subjective: Patient seen and examined at bedside.  Poor historian.  No overnight fever or vomiting reported by nursing staff.  Nods his head to some questions. He nodded his head when I brought the topic of amputation again this morning.  Objective: Vitals:   04/18/19 0842 04/18/19 1523 04/19/19 0013 04/19/19 0506  BP: (!) 141/105 (!) 116/52 (!) 115/59 (!) 120/59  Pulse:  89 79 78  Resp:      Temp:  97.6 F (36.4 C) (!) 97.5 F (36.4 C) 98.2 F (36.8 C)  TempSrc:  Oral Oral Oral  SpO2:  98% 99% 99%  Weight:    80.3 kg  Height:        Intake/Output Summary (Last 24 hours) at 04/19/2019 0802 Last data filed at 04/19/2019 0538 Gross per 24 hour  Intake 1974.23 ml  Output 1100 ml  Net 874.23 ml   [employer]  04/17/19 0526 04/18/19 0609 04/19/19 0506  Weight: 79.4 kg 78.5 kg 80.3 kg    Examination:  General exam: No distress.  Looks older than stated age.  Nods head to some questions.   Does not answer any questions. Respiratory system: Bilateral decreased breath sounds at bases with some crackles.  No wheezing  cardiovascular system: S1-S2 heard, rate controlled Gastrointestinal system: Abdomen is nondistended, soft and nontender. Normal bowel sounds heard. Extremities:  trace edema.  Left leg dressing present.   Data Reviewed: I have personally reviewed following labs and imaging studies  CBC: Recent Labs  Lab 04/15/19 0501 04/07/2019 0111 04/17/19 0436 04/18/19 0409 04/19/19 0403  WBC 14.3* 12.5* 13.3* 13.5* 10.9*  NEUTROABS 11.0* 9.3* 9.5* 9.4* 7.3  HGB 9.7* 9.8* 9.3* 8.8* 9.0*  HCT 31.2* 31.3* 29.7* 28.8* 29.4*  MCV 82.8 82.4 82.7 85.0 84.2  PLT 355 312 321 339 [id number]   Basic Metabolic Panel: Recent Labs  Lab 04/15/19 0501 04/24/2019 0111 04/17/19 0436 04/18/19 0409 04/19/19 0403  NA 137 138 136 136 138  K 3.5 2.9* 3.2* 3.2* 4.1  CL 98 101 101 104 108  CO2 27 27 23 22  19*  GLUCOSE 161* 57* 239* 387* 364*  BUN 14 13 21* 31* 34*  CREATININE 2.00* 2.01* 2.18* 2.56* 2.32*  CALCIUM 7.7* 7.8* 7.8* 7.8* 7.9*  MG 1.8 1.8 1.8 1.8 1.8   GFR: Estimated Creatinine Clearance: 36.3 mL/min (A) (by C-G formula based on SCr of 2.32 mg/dL (H)). Liver Function Tests: Recent Labs  Lab 04/14/19 0715 04/19/19 0403  AST 42* 93*  ALT 19 41  ALKPHOS 87 108  BILITOT 0.9 0.4  PROT 6.1* 6.1*  ALBUMIN 1.7* 1.8*   No results for input(s): LIPASE, AMYLASE in the last 168 hours. No results for input(s): AMMONIA in the last 168 hours. Coagulation Profile: No results for input(s): INR, PROTIME in the last 168 hours. Cardiac Enzymes: No results for input(s): CKTOTAL, CKMB, CKMBINDEX, TROPONINI in the last 168 hours. BNP (last 3 results) No results for input(s): PROBNP in the last 8760 hours. HbA1C: No results for input(s): HGBA1C in the last 72 hours. CBG: Recent Labs  Lab 04/18/19 1137 04/18/19 1656 04/18/19 2029 04/19/19 0003 04/19/19 0754  GLUCAP 373* 444*  383* 393* 292*   Lipid Profile: No results for input(s): CHOL, HDL, LDLCALC, TRIG, CHOLHDL, LDLDIRECT in the last 72 hours. Thyroid Function Tests: No results for input(s): TSH, T4TOTAL, FREET4, T3FREE, THYROIDAB in the last 72 hours. Anemia Panel: No results for input(s): VITAMINB12, FOLATE, FERRITIN, TIBC, IRON, RETICCTPCT in the last 72 hours. Sepsis Labs: No results for input(s): PROCALCITON, LATICACIDVEN in the last 168 hours.  Recent Results (from the past 240 hour(s))  SARS Coronavirus 2 [facility] order, Performed in [facility] hospital lab) Nasopharyngeal Nasopharyngeal Swab     Status: None   Collection Time: 04/12/2019  8:30 AM   Specimen: Nasopharyngeal Swab  Result Value Ref Range Status   SARS Coronavirus 2 NEGATIVE NEGATIVE Final    Comment: (NOTE) If result is NEGATIVE SARS-CoV-2 target nucleic acids are NOT DETECTED. The SARS-CoV-2 RNA is generally detectable in upper and lower  respiratory specimens during the acute phase of infection. The lowest  concentration of SARS-CoV-2 viral copies this assay can detect is 250  copies / mL. A negative result does not preclude SARS-CoV-2 infection  and should not be used as the sole basis for treatment or other  patient management decisions.  A negative result may occur with  improper  specimen collection / handling, submission of specimen other  than nasopharyngeal swab, presence of viral mutation(s) within the  areas targeted by this assay, and inadequate number of viral copies  (<250 copies / mL). A negative result must be combined with clinical  observations, patient history, and epidemiological information. If result is POSITIVE SARS-CoV-2 target nucleic acids are DETECTED. The SARS-CoV-2 RNA is generally detectable in upper and lower  respiratory specimens dur ing the acute phase of infection.  Positive  results are indicative of active infection with SARS-CoV-2.  Clinical  correlation with patient history and other  diagnostic information is  necessary to determine patient infection status.  Positive results do  not rule out bacterial infection or co-infection with other viruses. If result is PRESUMPTIVE POSTIVE SARS-CoV-2 nucleic acids MAY BE PRESENT.   A presumptive positive result was obtained on the submitted specimen  and confirmed on repeat testing.  While 2019 novel coronavirus  (SARS-CoV-2) nucleic acids may be present in the submitted sample  additional confirmatory testing may be necessary for epidemiological  and / or clinical management purposes  to differentiate between  SARS-CoV-2 and other Sarbecovirus currently known to infect humans.  If clinically indicated additional testing with an alternate test  methodology [phone]) is advised. The SARS-CoV-2 RNA is generally  detectable in upper and lower respiratory sp ecimens during the acute  phase of infection. The expected result is Negative. Fact Sheet for Patients:  StrictlyIdeas.no Fact Sheet for Healthcare Providers: BankingDealers.co.za This test is not yet approved or cleared by the [place] FDA and has been authorized for detection and/or diagnosis of SARS-CoV-2 by FDA under an Emergency Use Authorization (EUA).  This EUA will remain in effect (meaning this test can be used) for the duration of the COVID-19 declaration under Section 564(b)(1) of the Act, 21 U.S.C. section 360bbb-3(b)(1), unless the authorization is terminated or revoked sooner. Performed at [facility], [place] [address] [place], [place] [address]   Culture, blood (routine x 2)     Status: None   Collection Time: 04/25/2019  2:30 PM   Specimen: BLOOD RIGHT ARM  Result Value Ref Range Status   Specimen Description BLOOD RIGHT ARM  Final   Special Requests   Final    BOTTLES DRAWN AEROBIC AND ANAEROBIC Blood Culture adequate volume   Culture   Final    NO GROWTH 5 DAYS Performed at [place], [place] [address], [place], [place] [address]    Report Status 04/15/2019 FINAL  Final  Culture, blood (routine x 2)     Status: None   Collection Time: 04/21/2019  3:02 PM   Specimen: BLOOD RIGHT ARM  Result Value Ref Range Status   Specimen Description BLOOD RIGHT ARM  Final   Special Requests AEROBIC BOTTLE ONLY Blood Culture adequate volume  Final   Culture   Final    NO GROWTH 5 DAYS Performed at [facility], [place] [address], [place], [place] [address]    Report Status 04/15/2019 FINAL  Final  MRSA PCR Screening     Status: None   Collection Time: 04/11/19  5:00 AM   Specimen: Nasopharyngeal  Result Value Ref Range Status   MRSA by PCR NEGATIVE NEGATIVE Final    Comment:        The GeneXpert MRSA Assay (FDA approved for NASAL specimens only), is one component of a comprehensive MRSA colonization surveillance program. It is not intended to diagnose MRSA infection nor to guide  or monitor treatment for MRSA infections. Performed at [facility], [place] [address] [place], [place] [address]   Culture, Urine     Status: Abnormal   Collection Time: 04/14/19  4:14 AM   Specimen: Urine, Random  Result Value Ref Range Status   Specimen Description URINE, RANDOM  Final   Special Requests   Final    NONE Performed at [facility], [place] [address] [place], [place] [address]    Culture 80,000 COLONIES/mL YEAST (A)  Final   Report Status 04/15/2019 FINAL  Final  Surgical PCR screen     Status: None   Collection Time: 04/14/19  4:14 AM   Specimen: Urine, Suprapubic; Nasal Swab  Result Value Ref Range Status   MRSA, PCR NEGATIVE NEGATIVE Final   Staphylococcus aureus NEGATIVE NEGATIVE Final    Comment: (NOTE) The Xpert SA Assay (FDA approved for NASAL specimens in patients 10 years of age and older), is one component of a comprehensive surveillance program. It is not intended to diagnose infection nor to guide or monitor treatment. Performed at [place], [place] [address] [place], [place] [address]          Radiology Studies: No results found.      Scheduled Meds: . amLODipine  10 mg Oral Daily  . Chlorhexidine Gluconate Cloth  6 each Topical Q0600  . feeding supplement  1 Container Oral TID BM  . feeding supplement (PRO-STAT SUGAR FREE 64)  30 mL Oral TID WC  . folic acid  1 mg Oral Daily  . hydrALAZINE  25 mg Oral TID  . insulin aspart  0-9 Units Subcutaneous TID WC  . insulin detemir  5 Units Subcutaneous QHS  . levETIRAcetam  250 mg Oral BID  . metroNIDAZOLE  500 mg Oral Q8H  . mirtazapine  15 mg Oral QHS  . multivitamin with minerals  1 tablet Oral Daily  . nutrition supplement (JUVEN)  1 packet Oral BID BM  . pantoprazole  40 mg Oral Q0600  . sertraline  200 mg Oral Daily  . sodium chloride flush  3 mL Intravenous Q12H  . ziprasidone  20 mg Oral Daily  . ziprasidone  40 mg Oral QHS   Continuous Infusions: . 0.9 % NaCl with KCl 40 mEq / L 75 mL/hr at 04/19/19 0500  . cefTRIAXone (ROCEPHIN)  IV Stopped (04/18/19 1021)     LOS: 7 days        Aline August, MD [facility] 04/19/2019, 8:02 AM

## 2019-04-19 NOTE — Progress Notes (Signed)
Cleaned suprapubic catheter with soap and water first before cleaning with the Provon foley wipes. After cleaning catheter changed the split gauze dressing due to having some yellow drainage on the dressing.

## 2019-04-20 DIAGNOSIS — I70262 Atherosclerosis of native arteries of extremities with gangrene, left leg: Secondary | ICD-10-CM

## 2019-04-20 DIAGNOSIS — I96 Gangrene, not elsewhere classified: Secondary | ICD-10-CM

## 2019-04-20 LAB — BASIC METABOLIC PANEL
Anion gap: 12 (ref 5–15)
BUN: 32 mg/dL — ABNORMAL HIGH (ref 6–20)
CO2: 18 mmol/L — ABNORMAL LOW (ref 22–32)
Calcium: 7.9 mg/dL — ABNORMAL LOW (ref 8.9–10.3)
Chloride: 110 mmol/L (ref 98–111)
Creatinine, Ser: 2.03 mg/dL — ABNORMAL HIGH (ref 0.61–1.24)
GFR calc Af Amer: 41 mL/min — ABNORMAL LOW (ref >60)
GFR calc non Af Amer: 35 mL/min — ABNORMAL LOW (ref >60)
Glucose, Bld: 192 mg/dL — ABNORMAL HIGH (ref 70–99)
Potassium: 3.7 mmol/L (ref 3.5–5.1)
Sodium: 140 mmol/L (ref 135–145)

## 2019-04-20 LAB — CBC WITH DIFFERENTIAL/PLATELET
Abs Immature Granulocytes: 0.05 10*3/uL (ref 0.00–0.07)
Basophils Absolute: 0.1 10*3/uL (ref 0.0–0.1)
Basophils Relative: 1 %
Eosinophils Absolute: 0.6 10*3/uL — ABNORMAL HIGH (ref 0.0–0.5)
Eosinophils Relative: 5 %
HCT: 29.3 % — ABNORMAL LOW (ref 39.0–52.0)
Hemoglobin: 8.9 g/dL — ABNORMAL LOW (ref 13.0–17.0)
Immature Granulocytes: 1 %
Lymphocytes Relative: 20 %
Lymphs Abs: 2.2 10*3/uL (ref 0.7–4.0)
MCH: 25.4 pg — ABNORMAL LOW (ref 26.0–34.0)
MCHC: 30.4 g/dL (ref 30.0–36.0)
MCV: 83.7 fL (ref 80.0–100.0)
Monocytes Absolute: 0.9 10*3/uL (ref 0.1–1.0)
Monocytes Relative: 8 %
Neutro Abs: 7.2 10*3/uL (ref 1.7–7.7)
Neutrophils Relative %: 65 %
Platelets: 326 10*3/uL (ref 150–400)
RBC: 3.5 MIL/uL — ABNORMAL LOW (ref 4.22–5.81)
RDW: 17.6 % — ABNORMAL HIGH (ref 11.5–15.5)
WBC: 11 10*3/uL — ABNORMAL HIGH (ref 4.0–10.5)
nRBC: 0 % (ref 0.0–0.2)

## 2019-04-20 LAB — MAGNESIUM: Magnesium: 1.7 mg/dL (ref 1.7–2.4)

## 2019-04-20 LAB — GLUCOSE, CAPILLARY
Glucose-Capillary: 179 mg/dL — ABNORMAL HIGH (ref 70–99)
Glucose-Capillary: 29 mg/dL — CL (ref 70–99)
Glucose-Capillary: 32 mg/dL — CL (ref 70–99)
Glucose-Capillary: 165 mg/dL — ABNORMAL HIGH (ref 70–99)
Glucose-Capillary: 223 mg/dL — ABNORMAL HIGH (ref 70–99)
Glucose-Capillary: 298 mg/dL — ABNORMAL HIGH (ref 70–99)
Glucose-Capillary: 226 mg/dL — ABNORMAL HIGH (ref 70–99)

## 2019-04-20 MED ORDER — DEXTROSE 50 % IV SOLN
INTRAVENOUS | Status: AC
  Administered 2019-04-20: 06:00:00 25 mL
  Filled 2019-04-20: qty 50

## 2019-04-20 NOTE — Progress Notes (Signed)
Hypoglycemic Event  CBG: 29  Treatment: 8 oz juice/soda & peanut butter crackers  Symptoms: Sweaty  Follow-up CBG: [id number] CBG Result:32  Possible Reasons for Event: Inadequate meal intake  Comments/MD notified: Bodenheimer   Pt given 1/2 amp of D5 given to pt after juice & peanut butter failed. F/u cbg was 179   Tinzlee Craker R

## 2019-04-20 NOTE — Anesthesia Preprocedure Evaluation (Addendum)
Anesthesia Evaluation  Patient identified by MRN, date of birth, ID band Patient awake and Patient confused    Reviewed: Allergy & Precautions, NPO status , Patient's Chart, lab work & pertinent test results, Unable to perform ROS - Chart review only  History of Anesthesia Complications Negative for: history of anesthetic complications  Airway Mallampati: III  TM Distance: >3 FB Neck ROM: Full    Dental  (+) Dental Advisory Given   Pulmonary neg pulmonary ROS,    breath sounds clear to auscultation       Cardiovascular hypertension, Pt. on medications + Peripheral Vascular Disease   Rhythm:Regular Rate:Normal     Neuro/Psych Anxiety Depression autism    GI/Hepatic Neg liver ROS, GERD  Medicated,  Endo/Other  diabetes (glu 400), Insulin Dependent, Oral Hypoglycemic Agents  Renal/GU Renal InsufficiencyRenal disease (creat 2.03)     Musculoskeletal   Abdominal   Peds  Hematology  (+) Blood dyscrasia (Hb 8.9), anemia ,   Anesthesia Other Findings   Reproductive/Obstetrics                            Anesthesia Physical Anesthesia Plan  ASA: III  Anesthesia Plan: General   Post-op Pain Management:    Induction: Intravenous  PONV Risk Score and Plan: 3 and Ondansetron, Dexamethasone and Treatment may vary due to age or medical condition  Airway Management Planned: Oral ETT and Video Laryngoscope Planned  Additional Equipment:   Intra-op Plan:   Post-operative Plan: Extubation in OR  Informed Consent: I have reviewed the patients History and Physical, chart, labs and discussed the procedure including the risks, benefits and alternatives for the proposed anesthesia with the patient or authorized representative who has indicated his/her understanding and acceptance.     Dental advisory given and Consent reviewed with POA  Plan Discussed with: CRNA and Surgeon  Anesthesia Plan  Comments: (Discussed with pt's guardian)       Anesthesia Quick Evaluation

## 2019-04-20 NOTE — Progress Notes (Signed)
Patient ID: [name], male   DOB: 1961-05-22, 58 y.o.   MRN: [id number]  PROGRESS NOTE    [name]  [id number] DOB: Jul 20, 1961 DOA: 04/19/2019 PCP: Rosita Fire, MD   Brief Narrative:  58 y.o.malewith medical history significant ofsevere autism with elective mutism, schizoaffective disorder, diabetes mellitus type 2, chronic kidney disease, chronic suprapubic catheter for urinary strictures, who presented for CT angiogram of the left leg with Dr. Scot Dock for nonhealing ulcer. However, patient was found to have hemoglobin 7.8 for which the procedure was canceled.  He underwent surgical debridement on February 26, 2019 and was noted to have dropping hemoglobin.  Patient was recently found to have dropping hemoglobin for which Dr. Befekadu/nephrology had started iron infusions.  On admission, vascular surgery was consulted and patient was started on intravenous antibiotics.  Assessment & Plan:   Left heel gangrene -Vascular surgery following.  Vascular surgery recommending AKA but patient had not been sure yet.  He had nodded his head on 04/19/2019 when I brought the topic of amputation.  I had notified Dr. fields/vascular surgery about the same on 04/19/2019.  Will follow further recommendations. -Currently on Rocephin and Flagyl.  vancomycin and Zosyn discontinued on 04/17/2019.   -Currently afebrile.   Leukocytosis -Probably from above.  Resolved.  Iron deficiency anemia/anemia of chronic disease/folate deficiency -No overt signs of GI bleeding.  FOBT negative.  GI recommended outpatient GI evaluation and follow-up -Status post 2 units packed red cells transfusion since admission.  Hemoglobin stable currently.  Also status post IV Feraheme on 04/11/2019  -Folate levels low at 3.8.  Continue supplementation. -Vitamin B12 level at 1214 -Continue PPI  Acute kidney injury superimposed on chronic kidney disease stage III with history of chronic suprapubic catheter -Unclear  baseline. -Creatinine today is 2.03.  DC'd vancomycin.  Continue normal saline.  Hypokalemia -Improved.  Hypertension -Continue amlodipine and hydralazine.  Diabetes mellitus type 2 with hyperglycemia and hypoglycemia -A1c 6.7.  -Still has fluctuating hyperglycemia and hypoglycemia.  Blood sugars were as low as 29 or less morning. -Oral meds on hold. -Continue CBGs with SSI.   -We will DC Levemir.  Hypoalbuminemia -Follow nutrition recommendations.  Schizoaffective disorder  -Stable.  Continue Zoloft, Geodon, Remeron  Autism/intellectual disability -Patient autistic at baseline and electively mute.  Patient nods his head or shakes his head to answer yes or no.  History of seizure disorder -No seizures noted.  Continue Keppra.   DVT prophylaxis: SCDs Code Status: Full Family Communication: None at bedside Disposition Plan: Depends on clinical outcome  Consultants: Vascular surgery/GI  Procedures: None  Antimicrobials: Vancomycin and Zosyn from 04/30/2019-04/26/2019 Rocephin from 04/17/2019 onwards Flagyl from 04/18/2019 onwards   Subjective: Patient seen and examined at bedside.  Poor historian.  No overnight fever or vomiting reported.  Nursing staff reports episodes of hypoglycemia overnight.  Objective: Vitals:   04/19/19 1412 04/19/19 1828 04/19/19 2118 04/20/19 0627  BP: 117/64 123/68 134/65 (!) 119/57  Pulse: 82  92 95  Resp:      Temp: 98 F (36.7 C)  97.7 F (36.5 C) 98 F (36.7 C)  TempSrc: Oral  Oral Oral  SpO2: 100%  100% 100%  Weight:    81.6 kg  Height:        Intake/Output Summary (Last 24 hours) at 04/20/2019 0806 Last data filed at 04/20/2019 0625 Gross per 24 hour  Intake 960 ml  Output 1950 ml  Net -990 ml   Filed Weights   04/18/19 0609 04/19/19 0506 04/20/19 [id number]  Weight: 78.5 kg 80.3 kg 81.6 kg    Examination:  General exam: No acute distress.  Looks older than stated age.  Nods to some questions.   Respiratory system: Bilateral  decreased breath sounds at bases with scattered crackles.   Cardiovascular system: Rate controlled, S1-S2 heard Gastrointestinal system: Abdomen is nondistended, soft and nontender. Normal bowel sounds heard. Extremities:  trace edema.  Left leg dressing present.   Data Reviewed: I have personally reviewed following labs and imaging studies  CBC: Recent Labs  Lab 04/15/19 0501 04/13/2019 0111 04/17/19 0436 04/18/19 0409 04/19/19 0403  WBC 14.3* 12.5* 13.3* 13.5* 10.9*  NEUTROABS 11.0* 9.3* 9.5* 9.4* 7.3  HGB 9.7* 9.8* 9.3* 8.8* 9.0*  HCT 31.2* 31.3* 29.7* 28.8* 29.4*  MCV 82.8 82.4 82.7 85.0 84.2  PLT 355 312 321 339 [id number]   Basic Metabolic Panel: Recent Labs  Lab 04/15/19 0501 04/15/2019 0111 04/17/19 0436 04/18/19 0409 04/19/19 0403  NA 137 138 136 136 138  K 3.5 2.9* 3.2* 3.2* 4.1  CL 98 101 101 104 108  CO2 27 27 23 22  19*  GLUCOSE 161* 57* 239* 387* 364*  BUN 14 13 21* 31* 34*  CREATININE 2.00* 2.01* 2.18* 2.56* 2.32*  CALCIUM 7.7* 7.8* 7.8* 7.8* 7.9*  MG 1.8 1.8 1.8 1.8 1.8   GFR: Estimated Creatinine Clearance: 36.3 mL/min (A) (by C-G formula based on SCr of 2.32 mg/dL (H)). Liver Function Tests: Recent Labs  Lab 04/14/19 0715 04/19/19 0403  AST 42* 93*  ALT 19 41  ALKPHOS 87 108  BILITOT 0.9 0.4  PROT 6.1* 6.1*  ALBUMIN 1.7* 1.8*   No results for input(s): LIPASE, AMYLASE in the last 168 hours. No results for input(s): AMMONIA in the last 168 hours. Coagulation Profile: No results for input(s): INR, PROTIME in the last 168 hours. Cardiac Enzymes: No results for input(s): CKTOTAL, CKMB, CKMBINDEX, TROPONINI in the last 168 hours. BNP (last 3 results) No results for input(s): PROBNP in the last 8760 hours. HbA1C: No results for input(s): HGBA1C in the last 72 hours. CBG: Recent Labs  Lab 04/19/19 2115 04/20/19 0533 04/20/19 0548 04/20/19 0602 04/20/19 0758  GLUCAP 223* 29* 32* 179* 165*   Lipid Profile: No results for input(s): CHOL, HDL,  LDLCALC, TRIG, CHOLHDL, LDLDIRECT in the last 72 hours. Thyroid Function Tests: No results for input(s): TSH, T4TOTAL, FREET4, T3FREE, THYROIDAB in the last 72 hours. Anemia Panel: No results for input(s): VITAMINB12, FOLATE, FERRITIN, TIBC, IRON, RETICCTPCT in the last 72 hours. Sepsis Labs: No results for input(s): PROCALCITON, LATICACIDVEN in the last 168 hours.  Recent Results (from the past 240 hour(s))  SARS Coronavirus 2 [facility] order, Performed in [facility] hospital lab) Nasopharyngeal Nasopharyngeal Swab     Status: None   Collection Time: 04/24/2019  8:30 AM   Specimen: Nasopharyngeal Swab  Result Value Ref Range Status   SARS Coronavirus 2 NEGATIVE NEGATIVE Final    Comment: (NOTE) If result is NEGATIVE SARS-CoV-2 target nucleic acids are NOT DETECTED. The SARS-CoV-2 RNA is generally detectable in upper and lower  respiratory specimens during the acute phase of infection. The lowest  concentration of SARS-CoV-2 viral copies this assay can detect is 250  copies / mL. A negative result does not preclude SARS-CoV-2 infection  and should not be used as the sole basis for treatment or other  patient management decisions.  A negative result may occur with  improper specimen collection / handling, submission of specimen other  than nasopharyngeal swab, presence  of viral mutation(s) within the  areas targeted by this assay, and inadequate number of viral copies  (<250 copies / mL). A negative result must be combined with clinical  observations, patient history, and epidemiological information. If result is POSITIVE SARS-CoV-2 target nucleic acids are DETECTED. The SARS-CoV-2 RNA is generally detectable in upper and lower  respiratory specimens dur ing the acute phase of infection.  Positive  results are indicative of active infection with SARS-CoV-2.  Clinical  correlation with patient history and other diagnostic information is  necessary to determine patient infection  status.  Positive results do  not rule out bacterial infection or co-infection with other viruses. If result is PRESUMPTIVE POSTIVE SARS-CoV-2 nucleic acids MAY BE PRESENT.   A presumptive positive result was obtained on the submitted specimen  and confirmed on repeat testing.  While 2019 novel coronavirus  (SARS-CoV-2) nucleic acids may be present in the submitted sample  additional confirmatory testing may be necessary for epidemiological  and / or clinical management purposes  to differentiate between  SARS-CoV-2 and other Sarbecovirus currently known to infect humans.  If clinically indicated additional testing with an alternate test  methodology [phone]) is advised. The SARS-CoV-2 RNA is generally  detectable in upper and lower respiratory sp ecimens during the acute  phase of infection. The expected result is Negative. Fact Sheet for Patients:  StrictlyIdeas.no Fact Sheet for Healthcare Providers: BankingDealers.co.za This test is not yet approved or cleared by the [place] FDA and has been authorized for detection and/or diagnosis of SARS-CoV-2 by FDA under an Emergency Use Authorization (EUA).  This EUA will remain in effect (meaning this test can be used) for the duration of the COVID-19 declaration under Section 564(b)(1) of the Act, 21 U.S.C. section 360bbb-3(b)(1), unless the authorization is terminated or revoked sooner. Performed at [facility], [place] [address] [place], [place] [address]   Culture, blood (routine x 2)     Status: None   Collection Time: 04/13/2019  2:30 PM   Specimen: BLOOD RIGHT ARM  Result Value Ref Range Status   Specimen Description BLOOD RIGHT ARM  Final   Special Requests   Final    BOTTLES DRAWN AEROBIC AND ANAEROBIC Blood Culture adequate volume   Culture   Final    NO GROWTH 5 DAYS Performed at [facility], [place] [address] [place], [place] [address]    Report Status  04/15/2019 FINAL  Final  Culture, blood (routine x 2)     Status: None   Collection Time: 04/18/2019  3:02 PM   Specimen: BLOOD RIGHT ARM  Result Value Ref Range Status   Specimen Description BLOOD RIGHT ARM  Final   Special Requests AEROBIC BOTTLE ONLY Blood Culture adequate volume  Final   Culture   Final    NO GROWTH 5 DAYS Performed at [facility], [place] [address] [place],  [address]    Report Status 04/15/2019 FINAL  Final  MRSA PCR Screening     Status: None   Collection Time: 04/11/19  5:00 AM   Specimen: Nasopharyngeal  Result Value Ref Range Status   MRSA by PCR NEGATIVE NEGATIVE Final    Comment:        The GeneXpert MRSA Assay (FDA approved for NASAL specimens only), is one component of a comprehensive MRSA colonization surveillance program. It is not intended to diagnose MRSA infection nor to guide or monitor treatment for MRSA infections. Performed at [facility], [address] [place], [place] [address]   Culture, Urine     Status: Abnormal   Collection Time: 04/14/19  4:14 AM   Specimen: Urine, Random  Result Value Ref Range Status   Specimen Description URINE, RANDOM  Final   Special Requests   Final    NONE Performed at [facility], [place] [address] [place], [place] [address]    Culture 80,000 COLONIES/mL YEAST (A)  Final   Report Status 04/15/2019 FINAL  Final  Surgical PCR screen     Status: None   Collection Time: 04/14/19  4:14 AM   Specimen: Urine, Suprapubic; Nasal Swab  Result Value Ref Range Status   MRSA, PCR NEGATIVE NEGATIVE Final   Staphylococcus aureus NEGATIVE NEGATIVE Final    Comment: (NOTE) The Xpert SA Assay (FDA approved for NASAL specimens in patients 47 years of age and older), is one component of a comprehensive surveillance program. It is not intended to diagnose infection nor to guide or monitor treatment. Performed at [facility], [place] [address], [place], [place] [address]           Radiology Studies: No results found.      Scheduled Meds: . amLODipine  10 mg Oral Daily  . Chlorhexidine Gluconate Cloth  6 each Topical Q0600  . feeding supplement  1 Container Oral TID BM  . feeding supplement (PRO-STAT SUGAR FREE 64)  30 mL Oral TID WC  . folic acid  1 mg Oral Daily  . hydrALAZINE  25 mg Oral TID  . insulin aspart  0-9 Units Subcutaneous TID WC  . insulin detemir  5 Units Subcutaneous BID  . levETIRAcetam  250 mg Oral BID  . metroNIDAZOLE  500 mg Oral Q8H  . mirtazapine  15 mg Oral QHS  . multivitamin with minerals  1 tablet Oral Daily  . nutrition supplement (JUVEN)  1 packet Oral BID BM  . pantoprazole  40 mg Oral Q0600  . sertraline  200 mg Oral Daily  . sodium chloride flush  3 mL Intravenous Q12H  . ziprasidone  20 mg Oral Daily  . ziprasidone  40 mg Oral QHS   Continuous Infusions: . sodium chloride 75 mL/hr at 04/19/19 2136  . cefTRIAXone (ROCEPHIN)  IV 2 g (04/19/19 0941)     LOS: 8 days        Aline August, MD [facility] 04/20/2019, 8:06 AM

## 2019-04-20 NOTE — Progress Notes (Signed)
Inpatient Diabetes Program Recommendations  AACE/ADA: New Consensus Statement on Inpatient Glycemic Control (2015)  Target Ranges:  Prepandial:   less than 140 mg/dL      Peak postprandial:   less than 180 mg/dL (1-2 hours)      Critically ill patients:  140 - 180 mg/dL   Lab Results  Component Value Date   GLUCAP 223 (H) 04/20/2019   HGBA1C 6.7 (H) 04/11/2019    Review of Glycemic Control Results for [name], [name] (MRN [id number]) as of 04/20/2019 13:02  Ref. Range 04/19/2019 11:25 04/19/2019 17:03 04/19/2019 21:15 04/20/2019 05:33 04/20/2019 05:48 04/20/2019 06:02 04/20/2019 07:58 04/20/2019 11:31  Glucose-Capillary Latest Ref Range: 70 - 99 mg/dL 191 (H) 213 (H) 223 (H) 29 (LL) 32 (LL) 179 (H) 165 (H) 223 (H)   Diabetes history: DM 2 Outpatient Diabetes medications: Levemir 10 units daily, 70/30-15 units with breakfast (hold if patient eats less than 150 mg/dL) Regular insulin 10 units prn CBG>400 mg/dL Current orders for Inpatient glycemic control:  Novolog sensitive tid with meals  Inpatient Diabetes Program Recommendations:   Note low blood sugar this AM. Levemir has been stopped due to hypoglycemia. If blood sugars increase, may consider restarting 1/2 of Levemir in the mornings (Levemir 5 units q AM).  Will follow.   Thanks  Adah Perl, RN, BC-ADM Inpatient Diabetes Coordinator Pager [phone] (8a-5p)

## 2019-04-20 NOTE — H&P (View-Only) (Signed)
   VASCULAR SURGERY ASSESSMENT & PLAN:   GANGRENE LEFT HEEL: The patient is now agreeable to proceed with a left above-the-knee amputation.  I have explained to the patient that without this I think he will ultimately develop sepsis which could be a life-threatening problem.  I have explained that this will need to be done above the knee.  I have also explained this to his caregiver Betsey Amen who will give consent for him.  She is coming to the hospital later today.  She is very concerned about his right lower extremity.  He does not have any wounds on the right foot but we will obtain noninvasive studies once he is had a few days to get over his amputation.   SUBJECTIVE:   No specific complaints  PHYSICAL EXAM:   Vitals:   04/19/19 1828 04/19/19 2118 04/20/19 0627 04/20/19 0912  BP: 123/68 134/65 (!) 119/57 138/68  Pulse:  92 95   Resp:      Temp:  97.7 F (36.5 C) 98 F (36.7 C) 98.2 F (36.8 C)  TempSrc:  Oral Oral   SpO2:  100% 100% 99%  Weight:   81.6 kg   Height:       Gangrene on the left heel with moderate drainage is unchanged.  LABS:   Lab Results  Component Value Date   WBC 11.0 (H) 04/20/2019   HGB 8.9 (L) 04/20/2019   HCT 29.3 (L) 04/20/2019   MCV 83.7 04/20/2019   PLT 326 04/20/2019   Lab Results  Component Value Date   CREATININE 2.03 (H) 04/20/2019   Lab Results  Component Value Date   INR 1.24 02/16/2012   CBG (last 3)  Recent Labs    04/20/19 0548 04/20/19 0602 04/20/19 0758  GLUCAP 32* 179* 165*    PROBLEM LIST:    Principal Problem:   Anemia, blood loss Active Problems:   Intellectual disability   Acute kidney injury superimposed on CKD (HCC)   Chronic indwelling Foley catheter   Diabetes mellitus out of control (HCC)   Leukocytosis   History of seizure   Gangrene ([place])   Pressure injury of skin   Folate deficiency   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 amLODipine  10 mg Oral Daily  . Chlorhexidine  Gluconate Cloth  6 each Topical Q0600  . feeding supplement  1 Container Oral TID BM  . feeding supplement (PRO-STAT SUGAR FREE 64)  30 mL Oral TID WC  . folic acid  1 mg Oral Daily  . hydrALAZINE  25 mg Oral TID  . insulin aspart  0-9 Units Subcutaneous TID WC  . levETIRAcetam  250 mg Oral BID  . metroNIDAZOLE  500 mg Oral Q8H  . mirtazapine  15 mg Oral QHS  . multivitamin with minerals  1 tablet Oral Daily  . nutrition supplement (JUVEN)  1 packet Oral BID BM  . pantoprazole  40 mg Oral Q0600  . sertraline  200 mg Oral Daily  . sodium chloride flush  3 mL Intravenous Q12H  . ziprasidone  20 mg Oral Daily  . ziprasidone  40 mg Oral QHS    [name] Beeper: [id number] Office: [phone] 04/20/2019

## 2019-04-20 NOTE — Progress Notes (Signed)
   VASCULAR SURGERY ASSESSMENT & PLAN:   GANGRENE LEFT HEEL: The patient is now agreeable to proceed with a left above-the-knee amputation.  I have explained to the patient that without this I think he will ultimately develop sepsis which could be a life-threatening problem.  I have explained that this will need to be done above the knee.  I have also explained this to his caregiver Betsey Amen who will give consent for him.  She is coming to the hospital later today.  She is very concerned about his right lower extremity.  He does not have any wounds on the right foot but we will obtain noninvasive studies once he is had a few days to get over his amputation.   SUBJECTIVE:   No specific complaints  PHYSICAL EXAM:   Vitals:   04/19/19 1828 04/19/19 2118 04/20/19 0627 04/20/19 0912  BP: 123/68 134/65 (!) 119/57 138/68  Pulse:  92 95   Resp:      Temp:  97.7 F (36.5 C) 98 F (36.7 C) 98.2 F (36.8 C)  TempSrc:  Oral Oral   SpO2:  100% 100% 99%  Weight:   81.6 kg   Height:       Gangrene on the left heel with moderate drainage is unchanged.  LABS:   Lab Results  Component Value Date   WBC 11.0 (H) 04/20/2019   HGB 8.9 (L) 04/20/2019   HCT 29.3 (L) 04/20/2019   MCV 83.7 04/20/2019   PLT 326 04/20/2019   Lab Results  Component Value Date   CREATININE 2.03 (H) 04/20/2019   Lab Results  Component Value Date   INR 1.24 02/16/2012   CBG (last 3)  Recent Labs    04/20/19 0548 04/20/19 0602 04/20/19 0758  GLUCAP 32* 179* 165*    PROBLEM LIST:    Principal Problem:   Anemia, blood loss Active Problems:   Intellectual disability   Acute kidney injury superimposed on CKD (HCC)   Chronic indwelling Foley catheter   Diabetes mellitus out of control (HCC)   Leukocytosis   History of seizure   Gangrene ([place])   Pressure injury of skin   Folate deficiency   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 amLODipine  10 mg Oral Daily  . Chlorhexidine  Gluconate Cloth  6 each Topical Q0600  . feeding supplement  1 Container Oral TID BM  . feeding supplement (PRO-STAT SUGAR FREE 64)  30 mL Oral TID WC  . folic acid  1 mg Oral Daily  . hydrALAZINE  25 mg Oral TID  . insulin aspart  0-9 Units Subcutaneous TID WC  . levETIRAcetam  250 mg Oral BID  . metroNIDAZOLE  500 mg Oral Q8H  . mirtazapine  15 mg Oral QHS  . multivitamin with minerals  1 tablet Oral Daily  . nutrition supplement (JUVEN)  1 packet Oral BID BM  . pantoprazole  40 mg Oral Q0600  . sertraline  200 mg Oral Daily  . sodium chloride flush  3 mL Intravenous Q12H  . ziprasidone  20 mg Oral Daily  . ziprasidone  40 mg Oral QHS    [name] Beeper: [id number] Office: [phone] 04/20/2019

## 2019-04-21 ENCOUNTER — Inpatient Hospital Stay (HOSPITAL_COMMUNITY): Payer: Medicaid Other | Admitting: Anesthesiology

## 2019-04-21 ENCOUNTER — Encounter (HOSPITAL_COMMUNITY): Admission: RE | Disposition: E | Payer: Self-pay | Source: Ambulatory Visit | Attending: Internal Medicine

## 2019-04-21 ENCOUNTER — Inpatient Hospital Stay (HOSPITAL_COMMUNITY): Payer: Medicaid Other

## 2019-04-21 ENCOUNTER — Encounter (HOSPITAL_COMMUNITY): Payer: Self-pay | Admitting: Certified Registered Nurse Anesthetist

## 2019-04-21 DIAGNOSIS — I96 Gangrene, not elsewhere classified: Secondary | ICD-10-CM

## 2019-04-21 HISTORY — PX: AMPUTATION: SHX166

## 2019-04-21 LAB — GLUCOSE, CAPILLARY
Glucose-Capillary: 400 mg/dL — ABNORMAL HIGH (ref 70–99)
Glucose-Capillary: 436 mg/dL — ABNORMAL HIGH (ref 70–99)
Glucose-Capillary: 362 mg/dL — ABNORMAL HIGH (ref 70–99)
Glucose-Capillary: 400 mg/dL — ABNORMAL HIGH (ref 70–99)
Glucose-Capillary: 373 mg/dL — ABNORMAL HIGH (ref 70–99)
Glucose-Capillary: 329 mg/dL — ABNORMAL HIGH (ref 70–99)
Glucose-Capillary: 198 mg/dL — ABNORMAL HIGH (ref 70–99)
Glucose-Capillary: 190 mg/dL — ABNORMAL HIGH (ref 70–99)
Glucose-Capillary: 142 mg/dL — ABNORMAL HIGH (ref 70–99)

## 2019-04-21 LAB — BASIC METABOLIC PANEL
Anion gap: 10 (ref 5–15)
Anion gap: 9 (ref 5–15)
BUN: 31 mg/dL — ABNORMAL HIGH (ref 6–20)
BUN: 30 mg/dL — ABNORMAL HIGH (ref 6–20)
CO2: 16 mmol/L — ABNORMAL LOW (ref 22–32)
CO2: 16 mmol/L — ABNORMAL LOW (ref 22–32)
Calcium: 7.7 mg/dL — ABNORMAL LOW (ref 8.9–10.3)
Calcium: 7.7 mg/dL — ABNORMAL LOW (ref 8.9–10.3)
Chloride: 110 mmol/L (ref 98–111)
Chloride: 113 mmol/L — ABNORMAL HIGH (ref 98–111)
Creatinine, Ser: 2.15 mg/dL — ABNORMAL HIGH (ref 0.61–1.24)
Creatinine, Ser: 2.16 mg/dL — ABNORMAL HIGH (ref 0.61–1.24)
GFR calc Af Amer: 38 mL/min — ABNORMAL LOW (ref >60)
GFR calc Af Amer: 38 mL/min — ABNORMAL LOW (ref >60)
GFR calc non Af Amer: 33 mL/min — ABNORMAL LOW (ref >60)
GFR calc non Af Amer: 33 mL/min — ABNORMAL LOW (ref >60)
Glucose, Bld: 497 mg/dL — ABNORMAL HIGH (ref 70–99)
Glucose, Bld: 213 mg/dL — ABNORMAL HIGH (ref 70–99)
Potassium: 4.2 mmol/L (ref 3.5–5.1)
Potassium: 3.7 mmol/L (ref 3.5–5.1)
Sodium: 136 mmol/L (ref 135–145)
Sodium: 138 mmol/L (ref 135–145)

## 2019-04-21 LAB — CBC WITH DIFFERENTIAL/PLATELET
Abs Immature Granulocytes: 0.03 10*3/uL (ref 0.00–0.07)
Basophils Absolute: 0.1 10*3/uL (ref 0.0–0.1)
Basophils Relative: 1 %
Eosinophils Absolute: 0.5 10*3/uL (ref 0.0–0.5)
Eosinophils Relative: 5 %
HCT: 28.9 % — ABNORMAL LOW (ref 39.0–52.0)
Hemoglobin: 8.9 g/dL — ABNORMAL LOW (ref 13.0–17.0)
Immature Granulocytes: 0 %
Lymphocytes Relative: 20 %
Lymphs Abs: 1.9 10*3/uL (ref 0.7–4.0)
MCH: 25.9 pg — ABNORMAL LOW (ref 26.0–34.0)
MCHC: 30.8 g/dL (ref 30.0–36.0)
MCV: 84.3 fL (ref 80.0–100.0)
Monocytes Absolute: 0.8 10*3/uL (ref 0.1–1.0)
Monocytes Relative: 9 %
Neutro Abs: 6.1 10*3/uL (ref 1.7–7.7)
Neutrophils Relative %: 65 %
Platelets: 316 10*3/uL (ref 150–400)
RBC: 3.43 MIL/uL — ABNORMAL LOW (ref 4.22–5.81)
RDW: 17.9 % — ABNORMAL HIGH (ref 11.5–15.5)
WBC: 9.5 10*3/uL (ref 4.0–10.5)
nRBC: 0 % (ref 0.0–0.2)

## 2019-04-21 LAB — MAGNESIUM: Magnesium: 1.6 mg/dL — ABNORMAL LOW (ref 1.7–2.4)

## 2019-04-21 LAB — LACTIC ACID, PLASMA: Lactic Acid, Venous: 3 mmol/L (ref 0.5–1.9)

## 2019-04-21 SURGERY — AMPUTATION, ABOVE KNEE
Anesthesia: General | Laterality: Left

## 2019-04-21 MED ORDER — FENTANYL CITRATE (PF) 250 MCG/5ML IJ SOLN
INTRAMUSCULAR | Status: AC
  Filled 2019-04-21: qty 5

## 2019-04-21 MED ORDER — ONDANSETRON HCL 4 MG/2ML IJ SOLN
INTRAMUSCULAR | Status: AC
  Filled 2019-04-21: qty 2

## 2019-04-21 MED ORDER — METOPROLOL TARTRATE 5 MG/5ML IV SOLN: 2.0000 mg | INTRAVENOUS | Status: DC | PRN

## 2019-04-21 MED ORDER — MIDAZOLAM HCL 2 MG/2ML IJ SOLN
INTRAMUSCULAR | Status: AC
  Filled 2019-04-21: qty 2

## 2019-04-21 MED ORDER — SODIUM CHLORIDE 0.9 % IV SOLN
INTRAVENOUS | Status: DC | PRN
  Administered 2019-04-21: 50 ug/min via INTRAVENOUS

## 2019-04-21 MED ORDER — INSULIN ASPART 100 UNIT/ML ~~LOC~~ SOLN
5.0000 [IU] | Freq: Once | SUBCUTANEOUS | Status: AC
  Administered 2019-04-21: 09:00:00 5 [IU] via SUBCUTANEOUS
  Filled 2019-04-21: qty 0.05

## 2019-04-21 MED ORDER — HYDROMORPHONE HCL 1 MG/ML IJ SOLN: 0.2500 mg | INTRAMUSCULAR | Status: DC | PRN

## 2019-04-21 MED ORDER — LIDOCAINE 2% (20 MG/ML) 5 ML SYRINGE
INTRAMUSCULAR | Status: DC | PRN
  Administered 2019-04-21: 80 mg via INTRAVENOUS

## 2019-04-21 MED ORDER — INSULIN ASPART 100 UNIT/ML ~~LOC~~ SOLN
2.0000 [IU] | Freq: Three times a day (TID) | SUBCUTANEOUS | Status: DC
  Administered 2019-04-23 – 2019-04-26 (×6): 2 [IU] via SUBCUTANEOUS

## 2019-04-21 MED ORDER — MAGNESIUM SULFATE 2 GM/50ML IV SOLN: 2.0000 g | Freq: Every day | INTRAVENOUS | Status: DC | PRN

## 2019-04-21 MED ORDER — MIDAZOLAM HCL 5 MG/5ML IJ SOLN
INTRAMUSCULAR | Status: DC | PRN
  Administered 2019-04-21: 2 mg via INTRAVENOUS

## 2019-04-21 MED ORDER — PROPOFOL 10 MG/ML IV BOLUS
INTRAVENOUS | Status: DC | PRN
  Administered 2019-04-21: 100 mg via INTRAVENOUS

## 2019-04-21 MED ORDER — INSULIN ASPART 100 UNIT/ML ~~LOC~~ SOLN
5.0000 [IU] | Freq: Once | SUBCUTANEOUS | Status: AC
  Administered 2019-04-21: 5 [IU] via SUBCUTANEOUS
  Filled 2019-04-21: qty 0.05

## 2019-04-21 MED ORDER — SUCCINYLCHOLINE CHLORIDE 20 MG/ML IJ SOLN
INTRAMUSCULAR | Status: DC | PRN
  Administered 2019-04-21: 120 mg via INTRAVENOUS

## 2019-04-21 MED ORDER — EPHEDRINE SULFATE 50 MG/ML IJ SOLN
INTRAMUSCULAR | Status: DC | PRN
  Administered 2019-04-21 (×2): 10 mg via INTRAVENOUS

## 2019-04-21 MED ORDER — PROPOFOL 10 MG/ML IV BOLUS
INTRAVENOUS | Status: AC
  Filled 2019-04-21: qty 20

## 2019-04-21 MED ORDER — PHENYLEPHRINE 40 MCG/ML (10ML) SYRINGE FOR IV PUSH (FOR BLOOD PRESSURE SUPPORT)
PREFILLED_SYRINGE | INTRAVENOUS | Status: AC
  Filled 2019-04-21: qty 10

## 2019-04-21 MED ORDER — INSULIN ASPART 100 UNIT/ML ~~LOC~~ SOLN
SUBCUTANEOUS | Status: AC
  Filled 2019-04-21: qty 1

## 2019-04-21 MED ORDER — SODIUM CHLORIDE 0.9 % IV BOLUS
500.0000 mL | Freq: Once | INTRAVENOUS | Status: AC
  Administered 2019-04-21: 500 mL via INTRAVENOUS

## 2019-04-21 MED ORDER — PROMETHAZINE HCL 25 MG/ML IJ SOLN: 6.2500 mg | INTRAMUSCULAR | Status: DC | PRN

## 2019-04-21 MED ORDER — BACITRACIN ZINC 500 UNIT/GM EX OINT
TOPICAL_OINTMENT | CUTANEOUS | Status: DC | PRN
  Administered 2019-04-21: 1 via TOPICAL

## 2019-04-21 MED ORDER — MORPHINE SULFATE (PF) 2 MG/ML IV SOLN
2.0000 mg | INTRAVENOUS | Status: DC | PRN
  Administered 2019-04-24: 2 mg via INTRAVENOUS
  Filled 2019-04-21: qty 1

## 2019-04-21 MED ORDER — SODIUM CHLORIDE 0.9 % IV SOLN: INTRAVENOUS | Status: DC

## 2019-04-21 MED ORDER — ONDANSETRON HCL 4 MG/2ML IJ SOLN
INTRAMUSCULAR | Status: DC | PRN
  Administered 2019-04-21: 4 mg via INTRAVENOUS

## 2019-04-21 MED ORDER — HEPARIN SODIUM (PORCINE) 5000 UNIT/ML IJ SOLN
5000.0000 [IU] | Freq: Three times a day (TID) | INTRAMUSCULAR | Status: DC
  Administered 2019-04-22 – 2019-04-28 (×19): 5000 [IU] via SUBCUTANEOUS
  Filled 2019-04-21 (×20): qty 1

## 2019-04-21 MED ORDER — FENTANYL CITRATE (PF) 100 MCG/2ML IJ SOLN
INTRAMUSCULAR | Status: DC | PRN
  Administered 2019-04-21: 100 ug via INTRAVENOUS

## 2019-04-21 MED ORDER — MIDAZOLAM HCL 2 MG/2ML IJ SOLN: 0.5000 mg | Freq: Once | INTRAMUSCULAR | Status: DC | PRN

## 2019-04-21 MED ORDER — INSULIN ASPART 100 UNIT/ML ~~LOC~~ SOLN
5.0000 [IU] | Freq: Once | SUBCUTANEOUS | Status: AC
  Administered 2019-04-21: 10:00:00 5 [IU] via SUBCUTANEOUS

## 2019-04-21 MED ORDER — INSULIN DETEMIR 100 UNIT/ML ~~LOC~~ SOLN
5.0000 [IU] | Freq: Every day | SUBCUTANEOUS | Status: DC
  Administered 2019-04-21 – 2019-04-22 (×2): 5 [IU] via SUBCUTANEOUS
  Filled 2019-04-21 (×2): qty 0.05

## 2019-04-21 MED ORDER — OXYCODONE-ACETAMINOPHEN 5-325 MG PO TABS: 1.0000 | ORAL_TABLET | ORAL | Status: DC | PRN

## 2019-04-21 MED ORDER — SODIUM CHLORIDE 0.9 % IV SOLN
INTRAVENOUS | Status: DC | PRN
  Administered 2019-04-21: 07:00:00 via INTRAVENOUS

## 2019-04-21 MED ORDER — BACITRACIN ZINC 500 UNIT/GM EX OINT
TOPICAL_OINTMENT | CUTANEOUS | Status: AC
  Filled 2019-04-21: qty 28.35

## 2019-04-21 MED ORDER — PHENYLEPHRINE HCL (PRESSORS) 10 MG/ML IV SOLN
INTRAVENOUS | Status: DC | PRN
  Administered 2019-04-21: 120 ug via INTRAVENOUS
  Administered 2019-04-21: 80 ug via INTRAVENOUS
  Administered 2019-04-21 (×2): 120 ug via INTRAVENOUS

## 2019-04-21 MED ORDER — 0.9 % SODIUM CHLORIDE (POUR BTL) OPTIME
TOPICAL | Status: DC | PRN
  Administered 2019-04-21: 09:00:00 1000 mL

## 2019-04-21 MED ORDER — MEPERIDINE HCL 25 MG/ML IJ SOLN: 6.2500 mg | INTRAMUSCULAR | Status: DC | PRN

## 2019-04-21 SURGICAL SUPPLY — 56 items
BANDAGE ESMARK 6X9 LF (GAUZE/BANDAGES/DRESSINGS) ×1 IMPLANT
BLADE SAW RECIP 87.9 MT (BLADE) ×3 IMPLANT
BNDG COHESIVE 6X5 TAN STRL LF (GAUZE/BANDAGES/DRESSINGS) ×3 IMPLANT
BNDG ELASTIC 4X5.8 VLCR STR LF (GAUZE/BANDAGES/DRESSINGS) ×4 IMPLANT
BNDG ELASTIC 6X5.8 VLCR STR LF (GAUZE/BANDAGES/DRESSINGS) ×2 IMPLANT
BNDG ESMARK 6X9 LF (GAUZE/BANDAGES/DRESSINGS) ×3
BNDG GAUZE ELAST 4 BULKY (GAUZE/BANDAGES/DRESSINGS) ×3 IMPLANT
CANISTER SUCT 3000ML PPV (MISCELLANEOUS) ×3 IMPLANT
COVER SURGICAL LIGHT HANDLE (MISCELLANEOUS) ×3 IMPLANT
COVER WAND RF STERILE (DRAPES) ×1 IMPLANT
CUFF TOURN SGL QUICK 18X4 (TOURNIQUET CUFF) IMPLANT
CUFF TOURN SGL QUICK 24 (TOURNIQUET CUFF)
CUFF TOURN SGL QUICK 34 (TOURNIQUET CUFF) ×2
CUFF TOURN SGL QUICK 42 (TOURNIQUET CUFF) IMPLANT
CUFF TRNQT CYL 24X4X16.5-23 (TOURNIQUET CUFF) IMPLANT
CUFF TRNQT CYL 34X4.125X (TOURNIQUET CUFF) IMPLANT
DRAIN CHANNEL 19F RND (DRAIN) IMPLANT
DRAPE HALF SHEET 40X57 (DRAPES) ×3 IMPLANT
DRAPE ORTHO SPLIT 77X108 STRL (DRAPES) ×4
DRAPE SURG ORHT 6 SPLT 77X108 (DRAPES) ×2 IMPLANT
DRAPE U-SHAPE 47X51 STRL (DRAPES) ×3 IMPLANT
DRSG ADAPTIC 3X8 NADH LF (GAUZE/BANDAGES/DRESSINGS) ×3 IMPLANT
DRSG PAD ABDOMINAL 8X10 ST (GAUZE/BANDAGES/DRESSINGS) ×2 IMPLANT
ELECT CAUTERY BLADE 6.4 (BLADE) ×3 IMPLANT
ELECT REM PT RETURN 9FT ADLT (ELECTROSURGICAL) ×3
ELECTRODE REM PT RTRN 9FT ADLT (ELECTROSURGICAL) ×1 IMPLANT
EVACUATOR SILICONE 100CC (DRAIN) IMPLANT
GAUZE SPONGE 4X4 12PLY STRL (GAUZE/BANDAGES/DRESSINGS) ×1 IMPLANT
GAUZE SPONGE 4X4 12PLY STRL LF (GAUZE/BANDAGES/DRESSINGS) ×2 IMPLANT
GLOVE BIO SURGEON STRL SZ7.5 (GLOVE) ×7 IMPLANT
GLOVE BIOGEL PI IND STRL 6.5 (GLOVE) IMPLANT
GLOVE BIOGEL PI IND STRL 8 (GLOVE) ×1 IMPLANT
GLOVE BIOGEL PI INDICATOR 6.5 (GLOVE) ×8
GLOVE BIOGEL PI INDICATOR 8 (GLOVE) ×2
GOWN STRL REUS W/ TWL LRG LVL3 (GOWN DISPOSABLE) ×3 IMPLANT
GOWN STRL REUS W/TWL LRG LVL3 (GOWN DISPOSABLE) ×8
KIT BASIN OR (CUSTOM PROCEDURE TRAY) ×3 IMPLANT
KIT TURNOVER KIT B (KITS) ×3 IMPLANT
NS IRRIG 1000ML POUR BTL (IV SOLUTION) ×3 IMPLANT
PACK GENERAL/GYN (CUSTOM PROCEDURE TRAY) ×3 IMPLANT
PAD ARMBOARD 7.5X6 YLW CONV (MISCELLANEOUS) ×6 IMPLANT
RASP HELIOCORDIAL MED (MISCELLANEOUS) IMPLANT
STAPLER VISISTAT (STAPLE) ×3 IMPLANT
STOCKINETTE IMPERVIOUS LG (DRAPES) ×3 IMPLANT
SUT ETHILON 3 0 PS 1 (SUTURE) ×2 IMPLANT
SUT SILK 0 TIES 10X30 (SUTURE) ×3 IMPLANT
SUT SILK 2 0 (SUTURE) ×2
SUT SILK 2 0 SH CR/8 (SUTURE) ×3 IMPLANT
SUT SILK 2-0 18XBRD TIE 12 (SUTURE) ×1 IMPLANT
SUT SILK 3 0 (SUTURE) ×2
SUT SILK 3-0 18XBRD TIE 12 (SUTURE) ×1 IMPLANT
SUT VIC AB 2-0 CT1 18 (SUTURE) ×3 IMPLANT
TOWEL GREEN STERILE (TOWEL DISPOSABLE) ×6 IMPLANT
TOWEL GREEN STERILE FF (TOWEL DISPOSABLE) ×3 IMPLANT
UNDERPAD 30X30 (UNDERPADS AND DIAPERS) ×3 IMPLANT
WATER STERILE IRR 1000ML POUR (IV SOLUTION) ×3 IMPLANT

## 2019-04-21 NOTE — Progress Notes (Signed)
CRITICAL VALUE ALERT  Critical Value:  Lactic Acid 3.0  Date & Time Notied:  04/11/2019 2315  Provider Notified: Baltazar Najjar  Orders Received/Actions taken:

## 2019-04-21 NOTE — Transfer of Care (Signed)
Immediate Anesthesia Transfer of Care Note  Patient: Linus Orn  Procedure(s) Performed: AMPUTATION ABOVE KNEE LEFT LEG (Left )  Patient Location: PACU  Anesthesia Type:General  Level of Consciousness: awake, alert  and oriented  Airway & Oxygen Therapy: Patient Spontanous Breathing and Patient connected to face mask oxygen  Post-op Assessment: Report given to RN and Post -op Vital signs reviewed and stable  Post vital signs: Reviewed and stable  Last Vitals:  Vitals Value Taken Time  BP 106/63 04/10/2019 0922  Temp    Pulse 87 04/17/2019 0927  Resp 18 04/11/2019 0927  SpO2 100 % 04/19/2019 0927  Vitals shown include unvalidated device data.  Last Pain:  Vitals:   04/13/2019 0522  TempSrc: Oral  PainSc:       Patients Stated Pain Goal: 0 ([id number])  Complications: No apparent anesthesia complications

## 2019-04-21 NOTE — Progress Notes (Signed)
Inpatient Rehabilitation Admissions Coordinator  Pt is a long term SNF resident. We will not pursue an inpt rehab admission. SW is involved on case.We will sign off at this time.  Danne Baxter, RN, MSN Rehab Admissions Coordinator [phone] 04/07/2019 3:10 PM

## 2019-04-21 NOTE — TOC Progression Note (Signed)
Transition of Care [facility] - Progression Note    Patient Details  Name: [name] MRN: [id number] Date of Birth: [birth year]  Transition of Care [facility]) CM/SW Contact  Eileen Stanford, LCSW Phone Number: 04/09/2019, 2:35 PM  Clinical Narrative:   Pt is a LTC resident at [facility] in [place]. Pt will need a updated COVID-19 test. [facility] in [place] is working with [facility] in order to get pt to [facility] in [place] at d/c.    Expected Discharge Plan: [place] Barriers to Discharge: Continued Medical Work up  Expected Discharge Plan and Services Expected Discharge Plan: [place] In-house Referral: Clinical Social Work   Post Acute Care Choice: [place] Living arrangements for the past 2 months: [place]                                       Social Determinants of Health (SDOH) Interventions    Readmission Risk Interventions No flowsheet data found.

## 2019-04-21 NOTE — Progress Notes (Signed)
Pharmacy Antibiotic Note  [name] is a 58 y.o. male admitted on 04/11/2019 with gangrenous wound on L foot. He was previously on antibiotics for this - now is s/p above knee amputation.  Antibiotics are being reinitiated for a possible pneumonia. SCr is 2.16. Will restart vancomycin at dose/freq based on lab values from last week.    Plan: -Cefepime 2 g IV q12h -Vancomycin 1250 mg IV q48h -Monitor renal fx, cultures, VR as needed   Height: 5\' 10"  (177.8 cm) Weight: 180 lb (81.6 kg) IBW/kg (Calculated) : 73  Temp (24hrs), Avg:99.2 F (37.3 C), Min:97.3 F (36.3 C), Max:103.1 F (39.5 C)  Recent Labs  Lab 04/04/2019 0128  04/18/19 0409 04/19/19 0403 04/20/19 0707 04/29/2019 0349 04/25/2019 2234  WBC  --    < > 13.5* 10.9* 11.0* 9.5 12.3*  CREATININE  --    < > 2.56* 2.32* 2.03* 2.15* 2.16*  LATICACIDVEN  --   --   --   --   --   --  3.0*  VANCOTROUGH 28*  --   --   --   --   --   --    < > = values in this interval not displayed.      Antimicrobials this admission: 8/7 zosyn > 8/14 8/7 vancomycin > 812; 8/19 >> 8/14 ceftriaxone > 8/18 8/15 flagyl >> 8/19 cefepime >>  Dose adjustments this admission: 8/13 VT: 28  Microbiology results: 8/18 blood cx:   Harvel Quale 04/17/2019 11:52 PM

## 2019-04-21 NOTE — Progress Notes (Signed)
Pt has temp of 103.1 and vomit on floor beside bed. Pt given Tylenol and Zofran. Hospitalist notified. Received new orders. Will continue to monitor patient.

## 2019-04-21 NOTE — Progress Notes (Signed)
Pt's guardian updated about pt's status & notified of pt leaving for surgery. Pt updated on guardian's location. Pre-procedure checklist done & report given. Hoover Brunette, RN

## 2019-04-21 NOTE — Progress Notes (Signed)
Patient ID: [name], male   DOB: June 30, 1961, 58 y.o.   MRN: [id number]  PROGRESS NOTE    [name]  [id number] DOB: February 15, 1961 DOA: 04/30/2019 PCP: Rosita Fire, MD   Brief Narrative:  58 y.o.malewith medical history significant ofsevere autism with elective mutism, schizoaffective disorder, diabetes mellitus type 2, chronic kidney disease, chronic suprapubic catheter for urinary strictures, who presented for CT angiogram of the left leg with Dr. Scot Dock for nonhealing ulcer. However, patient was found to have hemoglobin 7.8 for which the procedure was canceled.  He underwent surgical debridement on February 26, 2019 and was noted to have dropping hemoglobin.  Patient was recently found to have dropping hemoglobin for which Dr. Befekadu/nephrology had started iron infusions.  On admission, vascular surgery was consulted and patient was started on intravenous antibiotics.  Assessment & Plan:   Left heel gangrene -Vascular surgery following.  Patient was refusing AKA but finally agreed for the same. -Status post AKA today by vascular surgery. -Will continue antibiotics for 24 hours at least.  Currently on Rocephin and Flagyl.  vancomycin and Zosyn discontinued on 04/17/2019.   -Currently afebrile.  Leukocytosis -Probably from above.  Resolved.  Iron deficiency anemia/anemia of chronic disease/folate deficiency -No overt signs of GI bleeding.  FOBT negative.  GI recommended outpatient GI evaluation and follow-up -Status post 2 units packed red cells transfusion since admission.  Hemoglobin stable currently.  Also status post IV Feraheme on 04/11/2019  -Folate levels low at 3.8.  Continue supplementation. -Vitamin B12 level at 1214 -Continue PPI  Acute kidney injury superimposed on chronic kidney disease stage III with history of chronic suprapubic catheter -Unclear baseline. -Creatinine today is 2.15.  Continue normal saline for today.  Vancomycin has already been  discontinued.  Hypokalemia -Improved.  Hypomagnesemia -Replace.  Repeat a.m. labs  Hypertension -Continue amlodipine and hydralazine.  Diabetes mellitus type 2 with hyperglycemia and hypoglycemia -A1c 6.7.  -Still has fluctuating hyperglycemia and hypoglycemia.  Levemir was discontinued on 04/20/2019 because of extreme hypoglycemia.  Subsequently, his blood sugars have been very high.  Will start Levemir 5 units daily.  Also start NovoLog 2 units 3 times a day with meals. -Oral meds on hold. -Continue CBGs with SSI.    Hypoalbuminemia -Follow nutrition recommendations.  Schizoaffective disorder  -Stable.  Continue Zoloft, Geodon, Remeron  Autism/intellectual disability -Patient autistic at baseline and electively mute.  Patient nods his head or shakes his head to answer yes or no.  History of seizure disorder -No seizures noted.  Continue Keppra.   DVT prophylaxis: SCDs Code Status: Full Family Communication: None at bedside Disposition Plan: Might need CIR placement.  Consultants: Vascular surgery/GI  Procedures: None  Antimicrobials: Vancomycin and Zosyn from 04/17/2019-05/03/2019 Rocephin from 04/17/2019 onwards Flagyl from 04/18/2019 onwards   Subjective: Patient seen and examined at bedside.  Very sleepy; just returned back from surgery.  No overnight fever or vomiting reported by nursing staff.  Objective: Vitals:   04/25/2019 0922 04/04/2019 0937 04/30/2019 0949 04/30/2019 1000  BP: 106/63 96/68 105/66 (!) 105/57  Pulse: 83 83 83 84  Resp: 20 12 19 19   Temp: 98.1 F (36.7 C)  98.1 F (36.7 C)   TempSrc:      SpO2: 100% 100% 99% 97%  Weight:      Height:        Intake/Output Summary (Last 24 hours) at 04/25/2019 1042 Last data filed at 04/18/2019 1000 Gross per 24 hour  Intake 4706.98 ml  Output 2600 ml  Net 2106.98  ml   Filed Weights   04/18/19 0609 04/19/19 0506 04/20/19 0627  Weight: 78.5 kg 80.3 kg 81.6 kg    Examination:  General exam: No  distress.  Looks older than stated age.  Sleepy.   Respiratory system: Bilateral decreased breath sounds at bases with some scattered crackles.  No wheezing  cardiovascular system: S1-S2 heard, rate controlled Gastrointestinal system: Abdomen is nondistended, soft and nontender. Normal bowel sounds heard. Extremities:  trace edema.  Left AKA present with dressing.   Data Reviewed: I have personally reviewed following labs and imaging studies  CBC: Recent Labs  Lab 04/17/19 0436 04/18/19 0409 04/19/19 0403 04/20/19 0707 04/17/2019 0349  WBC 13.3* 13.5* 10.9* 11.0* 9.5  NEUTROABS 9.5* 9.4* 7.3 7.2 6.1  HGB 9.3* 8.8* 9.0* 8.9* 8.9*  HCT 29.7* 28.8* 29.4* 29.3* 28.9*  MCV 82.7 85.0 84.2 83.7 84.3  PLT 321 339 336 326 [id number]   Basic Metabolic Panel: Recent Labs  Lab 04/17/19 0436 04/18/19 0409 04/19/19 0403 04/20/19 0707 04/18/2019 0349  NA 136 136 138 140 136  K 3.2* 3.2* 4.1 3.7 4.2  CL 101 104 108 110 110  CO2 23 22 19* 18* 16*  GLUCOSE 239* 387* 364* 192* 497*  BUN 21* 31* 34* 32* 31*  CREATININE 2.18* 2.56* 2.32* 2.03* 2.15*  CALCIUM 7.8* 7.8* 7.9* 7.9* 7.7*  MG 1.8 1.8 1.8 1.7 1.6*   GFR: Estimated Creatinine Clearance: 39.1 mL/min (A) (by C-G formula based on SCr of 2.15 mg/dL (H)). Liver Function Tests: Recent Labs  Lab 04/19/19 0403  AST 93*  ALT 41  ALKPHOS 108  BILITOT 0.4  PROT 6.1*  ALBUMIN 1.8*   No results for input(s): LIPASE, AMYLASE in the last 168 hours. No results for input(s): AMMONIA in the last 168 hours. Coagulation Profile: No results for input(s): INR, PROTIME in the last 168 hours. Cardiac Enzymes: No results for input(s): CKTOTAL, CKMB, CKMBINDEX, TROPONINI in the last 168 hours. BNP (last 3 results) No results for input(s): PROBNP in the last 8760 hours. HbA1C: No results for input(s): HGBA1C in the last 72 hours. CBG: Recent Labs  Lab 04/30/2019 0726 04/12/2019 0857 04/04/2019 0924 05/01/2019 0951 04/09/2019 1032  GLUCAP 436* 400* 362*  373* 329*   Lipid Profile: No results for input(s): CHOL, HDL, LDLCALC, TRIG, CHOLHDL, LDLDIRECT in the last 72 hours. Thyroid Function Tests: No results for input(s): TSH, T4TOTAL, FREET4, T3FREE, THYROIDAB in the last 72 hours. Anemia Panel: No results for input(s): VITAMINB12, FOLATE, FERRITIN, TIBC, IRON, RETICCTPCT in the last 72 hours. Sepsis Labs: No results for input(s): PROCALCITON, LATICACIDVEN in the last 168 hours.  Recent Results (from the past 240 hour(s))  Culture, Urine     Status: Abnormal   Collection Time: 04/14/19  4:14 AM   Specimen: Urine, Random  Result Value Ref Range Status   Specimen Description URINE, RANDOM  Final   Special Requests   Final    NONE Performed at [facility], [address] [place],  [address]    Culture 80,000 COLONIES/mL YEAST (A)  Final   Report Status 04/15/2019 FINAL  Final  Surgical PCR screen     Status: None   Collection Time: 04/14/19  4:14 AM   Specimen: Urine, Suprapubic; Nasal Swab  Result Value Ref Range Status   MRSA, PCR NEGATIVE NEGATIVE Final   Staphylococcus aureus NEGATIVE NEGATIVE Final    Comment: (NOTE) The Xpert SA Assay (FDA approved for NASAL specimens in patients 4 years of age  and older), is one component of a comprehensive surveillance program. It is not intended to diagnose infection nor to guide or monitor treatment. Performed at [facility], [place] [address], [place],  [address]          Radiology Studies: No results found.      Scheduled Meds: . amLODipine  10 mg Oral Daily  . Chlorhexidine Gluconate Cloth  6 each Topical Q0600  . feeding supplement  1 Container Oral TID BM  . feeding supplement (PRO-STAT SUGAR FREE 64)  30 mL Oral TID WC  . folic acid  1 mg Oral Daily  . [START ON 04/22/2019] heparin  5,000 Units Subcutaneous Q8H  . hydrALAZINE  25 mg Oral TID  . insulin aspart      . insulin aspart  0-9 Units Subcutaneous TID WC  . levETIRAcetam  250 mg  Oral BID  . metroNIDAZOLE  500 mg Oral Q8H  . mirtazapine  15 mg Oral QHS  . multivitamin with minerals  1 tablet Oral Daily  . nutrition supplement (JUVEN)  1 packet Oral BID BM  . pantoprazole  40 mg Oral Q0600  . sertraline  200 mg Oral Daily  . sodium chloride flush  3 mL Intravenous Q12H  . ziprasidone  20 mg Oral Daily  . ziprasidone  40 mg Oral QHS   Continuous Infusions: . sodium chloride 75 mL/hr at 04/20/19 2150  . sodium chloride    . cefTRIAXone (ROCEPHIN)  IV 2 g (04/20/19 0910)  . magnesium sulfate bolus IVPB       LOS: 9 days        Aline August, MD [facility] 04/30/2019, 10:42 AM

## 2019-04-21 NOTE — Anesthesia Postprocedure Evaluation (Signed)
Anesthesia Post Note  Patient: [name]  Procedure(s) Performed: AMPUTATION ABOVE KNEE LEFT LEG (Left )     Patient location during evaluation: PACU Anesthesia Type: General Level of consciousness: awake and alert, oriented and patient cooperative Pain management: pain level controlled Vital Signs Assessment: post-procedure vital signs reviewed and stable Respiratory status: spontaneous breathing, nonlabored ventilation, respiratory function stable and patient connected to nasal cannula oxygen Cardiovascular status: blood pressure returned to baseline and stable Postop Assessment: no apparent nausea or vomiting Anesthetic complications: no Comments: glucose improving    Last Vitals:  Vitals:   05/04/2019 1044 04/13/2019 1057  BP: 96/60 96/60  Pulse:    Resp: 19   Temp:    SpO2:      Last Pain:  Vitals:   04/10/2019 1044  TempSrc:   PainSc: 0-No pain                 Andrina Locken,E. Deundra Bard

## 2019-04-21 NOTE — Op Note (Signed)
    NAME: [name]    MRN: [id number] DOB: [birth year]    DATE OF OPERATION: 04/20/2019  PREOP DIAGNOSIS:    Gangrene right heel  POSTOP DIAGNOSIS:    Same  PROCEDURE:    Right above-the-knee amputation  SURGEON: Judeth Cornfield. Scot Dock, MD, FACS  ASSIST: Arlee Muslim, PA  ANESTHESIA: General  EBL: 100 cc  INDICATIONS:    [name] is a 58 y.o. male who presented with a nonsalvageable left heel wound.  He had a contracture of the left knee and left above-the-knee amputation was recommended.  FINDINGS:   Good bleeding at the site of the amputation.  TECHNIQUE:   The patient was brought to the operating room and received a general anesthetic.  The left leg was prepped and draped in the usual sterile fashion.  A fishmouth incision was marked above the level of the patella.  Tourniquet was placed on the upper thigh.  The leg was exsanguinated with an Esmarch bandage and the tourniquet inflated to 300 mmHg.  The tourniquet was not effective therefore after the skin incision was made the remainder of the dissection was done using electrocautery.  The muscle was divided circumferentially.  The femoral artery and vein were individually suture ligated.  The saphenous vein was ligated and divided.  The dissection was carried down to the femur which was dissected free circumferentially.  The periosteum was elevated.  The bone was divided proximal to the level of skin division.  The tourniquet was then released.  Additional hemostasis was obtained using electrocautery.  The fascial layer was then closed with interrupted 2-0 Vicryl's.  The skin was closed with staples.  Sterile dressing was applied.  The patient tolerated the procedure well was transferred to the recovery room in stable condition.  All needle and sponge counts were correct.  [name], MD, FACS [facility]  DATE OF DICTATION:   04/20/2019

## 2019-04-21 NOTE — Anesthesia Procedure Notes (Signed)
Procedure Name: Intubation Date/Time: 04/14/2019 7:58 AM Performed by: Inda Coke, CRNA Pre-anesthesia Checklist: Patient identified, Emergency Drugs available, Suction available and Patient being monitored Patient Re-evaluated:Patient Re-evaluated prior to induction Oxygen Delivery Method: Circle System Utilized Preoxygenation: Pre-oxygenation with 100% oxygen Induction Type: IV induction and Rapid sequence Laryngoscope Size: Mac and 4 Grade View: Grade II Tube type: Oral Tube size: 7.5 mm Number of attempts: 1 Airway Equipment and Method: Stylet and Oral airway Placement Confirmation: ETT inserted through vocal cords under direct vision,  positive ETCO2 and breath sounds checked- equal and bilateral Secured at: 22 cm Tube secured with: Tape Dental Injury: Teeth and Oropharynx as per pre-operative assessment

## 2019-04-21 NOTE — Interval H&P Note (Signed)
History and Physical Interval Note:  04/27/2019 7:23 AM  [name]  has presented today for surgery, with the diagnosis of GANGRENE LEFT LOWER EXTREMITY.  The various methods of treatment have been discussed with the patient and family. After consideration of risks, benefits and other options for treatment, the patient has consented to  Procedure(s): AMPUTATION ABOVE KNEE LEFT LEG (Left) as a surgical intervention.  The patient's history has been reviewed, patient examined, no change in status, stable for surgery.  I have reviewed the patient's chart and labs.  Questions were answered to the patient's satisfaction.     [name]

## 2019-04-21 NOTE — NC FL2 (Signed)
[place] LEVEL OF CARE SCREENING TOOL     IDENTIFICATION  Patient Name: [name] Birthdate: [birth year] Sex: male Admission Date (Current Location): 04/07/2019  [place] and [place] Number:  [name] [id number] [place] and Address:  [facility] [place], [place] [address] [place], [place] [address]      Provider Number: [id number]  Attending Physician Name and Address:  Aline August, MD  Relative Name and Phone Number:  guardian Betsey Amen [phone] ext [phone]    Current Level of Care: Hospital Recommended Level of Care: [place] Prior Approval Number:    Date Approved/Denied:   PASRR Number: [id number]  Discharge Plan: SNF    Current Diagnoses: Patient Active Problem List   Diagnosis Date Noted  . Gas gangrene of foot ([place]) 04/12/2019  . Pressure injury of skin 04/11/2019  . Folate deficiency   . Anemia, blood loss 04/23/2019  . Leukocytosis 04/24/2019  . History of seizure 04/24/2019  . Gangrene ([place]) 05/03/2019  . Respiratory failure ([place]) 02/08/2012  . Diabetes mellitus out of control ([place]) 02/08/2012  . HCAP (healthcare-associated pneumonia) 10/22/2011  . SIRS (systemic inflammatory response syndrome) ([place]) 10/22/2011  . Acute kidney injury superimposed on CKD ([place]) 10/22/2011  . Hyperkalemia 10/22/2011  . Anemia 10/22/2011  . Hyperglycemia 10/22/2011  . Abnormal urinalysis 10/22/2011  . Chronic indwelling Foley catheter 10/22/2011  . Type II or unspecified type diabetes mellitus without mention of complication, not stated as uncontrolled 09/19/2011  . Acute renal failure ([place]) 09/19/2011  . Pneumonia 09/19/2011  . Sepsis(995.91) 09/19/2011  . UTI (lower urinary tract infection) 09/19/2011  . Intellectual disability 09/19/2011  . BPH (benign prostatic hyperplasia) 09/19/2011    Orientation RESPIRATION BLADDER Height & Weight     Self  Normal Continent Weight: 180 lb (81.6 kg) Height:  5\' 10"   (177.8 cm)  BEHAVIORAL SYMPTOMS/MOOD NEUROLOGICAL BOWEL NUTRITION STATUS      Incontinent Diet(heart healthy/carb modified, thin liquids)  AMBULATORY STATUS COMMUNICATION OF NEEDS Skin   Extensive Assist Verbally PU Stage and Appropriate Care(closed incision, left thigh, compression wrapped. diabetic ulcer on left heel)   PU Stage 2 Dressing: (sacrum, foam dressing, change PRN. scrotum, foam dressing.)                   Personal Care Assistance Level of Assistance  Bathing, Feeding, Dressing Bathing Assistance: Maximum assistance Feeding assistance: Limited assistance Dressing Assistance: Maximum assistance     Functional Limitations Info  Sight, Hearing, Speech Sight Info: Adequate Hearing Info: Adequate Speech Info: Adequate    SPECIAL CARE FACTORS FREQUENCY  PT (By licensed PT), OT (By licensed OT)     PT Frequency: 5x OT Frequency: 5x            Contractures Contractures Info: Not present    Additional Factors Info  Code Status, Allergies, Isolation Precautions Code Status Info: Full Code Allergies Info: No known allergies Psychotropic Info: mirtazapine (REMERON) tablet 15 mg daily at bedtime PO; sertraline (ZOLOFT) tablet 200 mg daily PO; ziprasidone (GEODON) capsule 20 mg daily PO; Insulin Sliding Scale Info: insulin aspart (novoLOG) injection 0-9 Units 3x daily with meals; insulin aspart (novoLOG) injection 3 Units 3x daily with meals; insulin detemir (LEVEMIR) injection 10 Units daily; ziprasidone (GEODON) capsule 40 mg daily at bedtime PO Isolation Precautions Info: MRSA     Current Medications (04/16/2019):  This is the current hospital active medication list Current Facility-Administered Medications  Medication Dose Route Frequency Provider Last Rate Last Dose  .  0.9 %  sodium chloride infusion   Intravenous Continuous Dagoberto Ligas, PA-C 75 mL/hr at 04/22/2019 1056    . 0.9 %  sodium chloride infusion   Intravenous Continuous Dagoberto Ligas, PA-C       . acetaminophen (TYLENOL) tablet 650 mg  650 mg Oral Q6H PRN Dagoberto Ligas, PA-C   650 mg at 04/15/19 2029   Or  . acetaminophen (TYLENOL) suppository 650 mg  650 mg Rectal Q6H PRN Dagoberto Ligas, PA-C      . albuterol (PROVENTIL) (2.5 MG/3ML) 0.083% nebulizer solution 2.5 mg  2.5 mg Nebulization Q6H PRN Dagoberto Ligas, PA-C      . amLODipine (NORVASC) tablet 10 mg  10 mg Oral Daily Dagoberto Ligas, PA-C   10 mg at 04/06/2019 1058  . cefTRIAXone (ROCEPHIN) 2 g in sodium chloride 0.9 % 100 mL IVPB  2 g Intravenous Q24H Dagoberto Ligas, PA-C 200 mL/hr at 04/20/19 0910 2 g at 04/15/2019 0811  . Chlorhexidine Gluconate Cloth 2 % PADS 6 each  6 each Topical Q0600 Dagoberto Ligas, PA-C   6 each at 04/23/2019 [id number]  . feeding supplement (BOOST / RESOURCE BREEZE) liquid 1 Container  1 Container Oral TID BM Dagoberto Ligas, PA-C   1 Container at 04/08/2019 1414  . feeding supplement (PRO-STAT SUGAR FREE 64) liquid 30 mL  30 mL Oral TID WC Dagoberto Ligas, PA-C   30 mL at 04/25/2019 1056  . folic acid (FOLVITE) tablet 1 mg  1 mg Oral Daily Dagoberto Ligas, PA-C   1 mg at 04/15/2019 1058  . [START ON 04/22/2019] heparin injection 5,000 Units  5,000 Units Subcutaneous Q8H Eveland, Matthew, PA-C      . hydrALAZINE (APRESOLINE) tablet 25 mg  25 mg Oral TID Dagoberto Ligas, PA-C   25 mg at 04/20/19 2143  . insulin aspart (novoLOG) 100 UNIT/ML injection           . insulin aspart (novoLOG) injection 0-9 Units  0-9 Units Subcutaneous TID WC Dagoberto Ligas, PA-C   7 Units at 04/25/2019 1057  . insulin aspart (novoLOG) injection 2 Units  2 Units Subcutaneous TID WC Alekh, Kshitiz, MD      . insulin detemir (LEVEMIR) injection 5 Units  5 Units Subcutaneous Daily Aline August, MD   5 Units at 05/04/2019 1413  . levETIRAcetam (KEPPRA) tablet 250 mg  250 mg Oral BID Dagoberto Ligas, PA-C   250 mg at 05/04/2019 1059  . LORazepam (ATIVAN) tablet 0.5 mg  0.5 mg Oral BID PRN Dagoberto Ligas, PA-C   0.5 mg at 04/15/19 2029   . magnesium sulfate IVPB 2 g 50 mL  2 g Intravenous Daily PRN Dagoberto Ligas, PA-C      . metoprolol tartrate (LOPRESSOR) injection 2-5 mg  2-5 mg Intravenous Q2H PRN Dagoberto Ligas, PA-C      . metroNIDAZOLE (FLAGYL) tablet 500 mg  500 mg Oral Q8H Dagoberto Ligas, PA-C   500 mg at 04/27/2019 1413  . mirtazapine (REMERON) tablet 15 mg  15 mg Oral QHS Dagoberto Ligas, PA-C   15 mg at 04/20/19 2143  . morphine 2 MG/ML injection 2-4 mg  2-4 mg Intravenous Q2H PRN Dagoberto Ligas, PA-C      . multivitamin with minerals tablet 1 tablet  1 tablet Oral Daily Dagoberto Ligas, PA-C   1 tablet at 04/27/2019 1058  . nutrition supplement (JUVEN) (JUVEN) powder packet 1 packet  1 packet Oral BID BM Dagoberto Ligas, PA-C   1 packet at 04/09/2019 1412  . ondansetron (  ZOFRAN) tablet 4 mg  4 mg Oral Q6H PRN Dagoberto Ligas, PA-C       Or  . ondansetron (ZOFRAN) injection 4 mg  4 mg Intravenous Q6H PRN Dagoberto Ligas, PA-C   4 mg at 04/14/19 1055  . oxyCODONE-acetaminophen (PERCOCET/ROXICET) 5-325 MG per tablet 1-2 tablet  1-2 tablet Oral Q4H PRN Dagoberto Ligas, PA-C      . pantoprazole (PROTONIX) EC tablet 40 mg  40 mg Oral Q0600 Dagoberto Ligas, PA-C   40 mg at 04/30/2019 0551  . sertraline (ZOLOFT) tablet 200 mg  200 mg Oral Daily Dagoberto Ligas, PA-C   200 mg at 05/02/2019 1057  . sodium chloride flush (NS) 0.9 % injection 3 mL  3 mL Intravenous Q12H Dagoberto Ligas, PA-C   3 mL at 04/17/19 2215  . ziprasidone (GEODON) capsule 20 mg  20 mg Oral Daily Dagoberto Ligas, PA-C   20 mg at 04/05/2019 1059  . ziprasidone (GEODON) capsule 40 mg  40 mg Oral QHS Dagoberto Ligas, PA-C   40 mg at 04/20/19 2143     Discharge Medications: Please see discharge summary for a list of discharge medications.  Relevant Imaging Results:  Relevant Lab Results:   Additional Information SSN:[id number] Synthia Fairbank, LCSW

## 2019-04-22 ENCOUNTER — Encounter (HOSPITAL_COMMUNITY): Payer: Self-pay | Admitting: Vascular Surgery

## 2019-04-22 LAB — LACTIC ACID, PLASMA: Lactic Acid, Venous: 0.9 mmol/L (ref 0.5–1.9)

## 2019-04-22 LAB — URINALYSIS, ROUTINE W REFLEX MICROSCOPIC
Bilirubin Urine: NEGATIVE
Glucose, UA: 50 mg/dL — AB
Ketones, ur: 5 mg/dL — AB
Nitrite: NEGATIVE
Protein, ur: 100 mg/dL — AB
Specific Gravity, Urine: 1.013 (ref 1.005–1.030)
WBC, UA: >50 WBC/hpf — ABNORMAL HIGH (ref 0–5)
pH: 6 (ref 5.0–8.0)

## 2019-04-22 LAB — GLUCOSE, CAPILLARY
Glucose-Capillary: 237 mg/dL — ABNORMAL HIGH (ref 70–99)
Glucose-Capillary: 394 mg/dL — ABNORMAL HIGH (ref 70–99)
Glucose-Capillary: 251 mg/dL — ABNORMAL HIGH (ref 70–99)
Glucose-Capillary: 138 mg/dL — ABNORMAL HIGH (ref 70–99)
Glucose-Capillary: 181 mg/dL — ABNORMAL HIGH (ref 70–99)

## 2019-04-22 LAB — CBC WITH DIFFERENTIAL/PLATELET
Abs Immature Granulocytes: 0.08 10*3/uL — ABNORMAL HIGH (ref 0.00–0.07)
Basophils Absolute: 0.1 10*3/uL (ref 0.0–0.1)
Basophils Relative: 1 %
Eosinophils Absolute: 0.4 10*3/uL (ref 0.0–0.5)
Eosinophils Relative: 3 %
HCT: 22.9 % — ABNORMAL LOW (ref 39.0–52.0)
Hemoglobin: 7 g/dL — ABNORMAL LOW (ref 13.0–17.0)
Immature Granulocytes: 1 %
Lymphocytes Relative: 11 %
Lymphs Abs: 1.4 10*3/uL (ref 0.7–4.0)
MCH: 25.7 pg — ABNORMAL LOW (ref 26.0–34.0)
MCHC: 30.6 g/dL (ref 30.0–36.0)
MCV: 84.2 fL (ref 80.0–100.0)
Monocytes Absolute: 1.1 10*3/uL — ABNORMAL HIGH (ref 0.1–1.0)
Monocytes Relative: 9 %
Neutro Abs: 9.4 10*3/uL — ABNORMAL HIGH (ref 1.7–7.7)
Neutrophils Relative %: 75 %
Platelets: 284 10*3/uL (ref 150–400)
RBC: 2.72 MIL/uL — ABNORMAL LOW (ref 4.22–5.81)
RDW: 18 % — ABNORMAL HIGH (ref 11.5–15.5)
WBC: 12.3 10*3/uL — ABNORMAL HIGH (ref 4.0–10.5)
nRBC: 0 % (ref 0.0–0.2)

## 2019-04-22 LAB — BASIC METABOLIC PANEL
Anion gap: 7 (ref 5–15)
BUN: 28 mg/dL — ABNORMAL HIGH (ref 6–20)
CO2: 18 mmol/L — ABNORMAL LOW (ref 22–32)
Calcium: 7.4 mg/dL — ABNORMAL LOW (ref 8.9–10.3)
Chloride: 113 mmol/L — ABNORMAL HIGH (ref 98–111)
Creatinine, Ser: 2.11 mg/dL — ABNORMAL HIGH (ref 0.61–1.24)
GFR calc Af Amer: 39 mL/min — ABNORMAL LOW (ref >60)
GFR calc non Af Amer: 34 mL/min — ABNORMAL LOW (ref >60)
Glucose, Bld: 248 mg/dL — ABNORMAL HIGH (ref 70–99)
Potassium: 3.7 mmol/L (ref 3.5–5.1)
Sodium: 138 mmol/L (ref 135–145)

## 2019-04-22 LAB — MRSA PCR SCREENING: MRSA by PCR: NEGATIVE

## 2019-04-22 LAB — CBC
HCT: 22.3 % — ABNORMAL LOW (ref 39.0–52.0)
Hemoglobin: 6.9 g/dL — CL (ref 13.0–17.0)
MCH: 25.7 pg — ABNORMAL LOW (ref 26.0–34.0)
MCHC: 30.9 g/dL (ref 30.0–36.0)
MCV: 82.9 fL (ref 80.0–100.0)
Platelets: 262 10*3/uL (ref 150–400)
RBC: 2.69 MIL/uL — ABNORMAL LOW (ref 4.22–5.81)
RDW: 18.1 % — ABNORMAL HIGH (ref 11.5–15.5)
WBC: 12.1 10*3/uL — ABNORMAL HIGH (ref 4.0–10.5)
nRBC: 0 % (ref 0.0–0.2)

## 2019-04-22 LAB — PREPARE RBC (CROSSMATCH)

## 2019-04-22 LAB — HEMOGLOBIN AND HEMATOCRIT, BLOOD
HCT: 25.5 % — ABNORMAL LOW (ref 39.0–52.0)
Hemoglobin: 8.1 g/dL — ABNORMAL LOW (ref 13.0–17.0)

## 2019-04-22 LAB — MAGNESIUM: Magnesium: 1.5 mg/dL — ABNORMAL LOW (ref 1.7–2.4)

## 2019-04-22 MED ORDER — SODIUM CHLORIDE 0.9 % IV SOLN
2.0000 g | Freq: Two times a day (BID) | INTRAVENOUS | Status: DC
  Administered 2019-04-22 – 2019-04-24 (×5): 2 g via INTRAVENOUS
  Filled 2019-04-22 (×6): qty 2

## 2019-04-22 MED ORDER — MAGNESIUM SULFATE 2 GM/50ML IV SOLN
2.0000 g | Freq: Once | INTRAVENOUS | Status: AC
  Administered 2019-04-22: 2 g via INTRAVENOUS
  Filled 2019-04-22: qty 50

## 2019-04-22 MED ORDER — VANCOMYCIN HCL 10 G IV SOLR
1250.0000 mg | INTRAVENOUS | Status: DC
  Administered 2019-04-22: 1250 mg via INTRAVENOUS
  Filled 2019-04-22 (×2): qty 1250

## 2019-04-22 MED ORDER — SODIUM CHLORIDE 0.9% IV SOLUTION: Freq: Once | INTRAVENOUS | Status: DC

## 2019-04-22 MED ORDER — GLUCERNA SHAKE PO LIQD
237.0000 mL | Freq: Three times a day (TID) | ORAL | Status: DC
  Administered 2019-04-22 – 2019-04-23 (×2): 237 mL via ORAL

## 2019-04-22 MED ORDER — ACETAMINOPHEN 325 MG PO TABS
650.0000 mg | ORAL_TABLET | Freq: Once | ORAL | Status: AC
  Administered 2019-04-22: 09:00:00 650 mg via ORAL

## 2019-04-22 MED ORDER — INSULIN DETEMIR 100 UNIT/ML ~~LOC~~ SOLN
7.0000 [IU] | Freq: Every day | SUBCUTANEOUS | Status: DC
  Administered 2019-04-23 – 2019-04-28 (×4): 7 [IU] via SUBCUTANEOUS
  Filled 2019-04-22 (×6): qty 0.07

## 2019-04-22 NOTE — Progress Notes (Signed)
VASCULAR SURGERY ASSESSMENT & PLAN:   POD 1 S/P: LEFT AKA: His dressing is dry.  We will change to the dressing tomorrow or Friday.  PERIPHERAL VASCULAR DISEASE: He has no wounds on the right foot but I have obtained noninvasive arterial studies on the right to have as a baseline.  We will need to follow this foot closely as an outpatient.  FEVER: There were no signs of infection at the site of the amputation.  He did have gangrene of the left heel and it is not unusual to have a bacteremia after surgery.  Thus the fever could be related to this.  If so it should resolve fairly quickly.  He continues intravenous antibiotics.  He is on intravenous Maxipime, Flagyl, and Vanco.  ACUTE BLOOD LOSS ANEMIA: Preoperative hemoglobin was 8.9.  He is drifted down to 6.9.  He likely had about 200 to 300 cc of blood loss.  I would favor transfusion but will leave this to the primary service.  DVT PROPHYLAXIS: He is on subcu heparin.  SUBJECTIVE:   His pain is well controlled.  PHYSICAL EXAM:   Vitals:   04/22/2019 2300 04/22/19 0220 04/22/19 0353 04/22/19 0628  BP:   (!) 134/51   Pulse:   (!) 105   Resp:   20   Temp: (!) 101.1 F (38.4 C) (!) 101.2 F (38.4 C) (!) 103 F (39.4 C) 100.3 F (37.9 C)  TempSrc:   Oral Oral  SpO2:   (!) 89%   Weight:      Height:       His dressing is dry on his left AKA. There are no wounds on the right foot.  LABS:   Lab Results  Component Value Date   WBC 12.1 (H) 04/22/2019   HGB 6.9 (LL) 04/22/2019   HCT 22.3 (L) 04/22/2019   MCV 82.9 04/22/2019   PLT 262 04/22/2019   Lab Results  Component Value Date   CREATININE 2.11 (H) 04/22/2019   Lab Results  Component Value Date   INR 1.24 02/16/2012   CBG (last 3)  Recent Labs    04/24/2019 1622 04/25/2019 2132 04/22/19 0348  GLUCAP 190* 142* 237*    PROBLEM LIST:    Principal Problem:   Anemia, blood loss Active Problems:   Intellectual disability   Acute kidney injury superimposed on  CKD (HCC)   Chronic indwelling Foley catheter   Diabetes mellitus out of control (HCC)   Leukocytosis   History of seizure   Gangrene ([place])   Pressure injury of skin   Folate deficiency   Gas gangrene of foot ([place])   CURRENT MEDS:   . sodium chloride   Intravenous Once  . amLODipine  10 mg Oral Daily  . Chlorhexidine Gluconate Cloth  6 each Topical Q0600  . feeding supplement  1 Container Oral TID BM  . feeding supplement (PRO-STAT SUGAR FREE 64)  30 mL Oral TID WC  . folic acid  1 mg Oral Daily  . heparin  5,000 Units Subcutaneous Q8H  . hydrALAZINE  25 mg Oral TID  . insulin aspart  0-9 Units Subcutaneous TID WC  . insulin aspart  2 Units Subcutaneous TID WC  . insulin detemir  5 Units Subcutaneous Daily  . levETIRAcetam  250 mg Oral BID  . metroNIDAZOLE  500 mg Oral Q8H  . mirtazapine  15 mg Oral QHS  . multivitamin with minerals  1 tablet Oral Daily  . nutrition supplement (JUVEN)  1  packet Oral BID BM  . pantoprazole  40 mg Oral Q0600  . sertraline  200 mg Oral Daily  . sodium chloride flush  3 mL Intravenous Q12H  . ziprasidone  20 mg Oral Daily  . ziprasidone  40 mg Oral QHS    [name] Beeper: [id number] Office: [phone] 04/22/2019

## 2019-04-22 NOTE — Progress Notes (Signed)
Patient has temp of 102.2. I have given him Tylenol and placed ice packs under armpits.  I will keep monitoring patient.

## 2019-04-22 NOTE — Progress Notes (Signed)
Physical Therapy Treatment Patient Details Name: [name] MRN: [id number] DOB: 04-Apr-1961 Today's Date: 04/22/2019    History of Present Illness Pt is a 58 y/o male who presents with anemia and L heel wound, found to be gangrenous. PMH significant for autism with elective mutism, schizophrenic disorder, DM, suprapubic catheter.. S/p Left AKA    PT Comments    Pt asleep on entry and receiving blood, however easily roused and willing to participate in therapy. On attempt at bed mobility pt found to be soiled in stool. Pt with increased fatigue today and focus of session rolling L and R x2 each direction for cleaning and rewrapping Ace bandage on L residual limb.  PT will follow back for more extensive Re-evaluation, however d/c back to SNF remains appropriate.    Follow Up Recommendations  SNF;Supervision/Assistance - 24 hour     Equipment Recommendations  None recommended by PT       Precautions / Restrictions Precautions Precautions: Fall Precaution Comments: Elective mutism, suprapubic catheter, skin breakdown in perineal area. Restrictions Weight Bearing Restrictions: No    Mobility  Bed Mobility Overal bed mobility: Needs Assistance Bed Mobility: Rolling Rolling: Mod assist;+2 for physical assistance         General bed mobility comments: rolled R<>L with modA+2;totalA for posterior pericare;  Transfers                 General transfer comment: deferred this session        Balance       Sitting balance - Comments: deferred this session                                    Cognition Arousal/Alertness: Awake/alert Behavior During Therapy: WFL for tasks assessed/performed Overall Cognitive Status: History of cognitive impairments - at baseline                                 General Comments: discussed grieving process associated with AKA;discussed desensitization and phantom limb pain/sensation         General  Comments General comments (skin integrity, edema, etc.): skin breakdown noted on scrotum;limited to bed level this session secondary to pt fatigue      Pertinent Vitals/Pain Pain Assessment: Faces Faces Pain Scale: Hurts little more Pain Location: pt did not specify Pain Descriptors / Indicators: Grimacing Pain Intervention(s): Limited activity within patient's tolerance;Monitored during session;Repositioned           PT Goals (current goals can now be found in the care plan section) Acute Rehab PT Goals Patient Stated Goal: Pt did not state goals this session PT Goal Formulation: With patient Time For Goal Achievement: 04/26/19 Potential to Achieve Goals: Fair Progress towards PT goals: PT to reassess next treatment    Frequency    Min 2X/week      PT Plan Current plan remains appropriate    Co-evaluation PT/OT/SLP Co-Evaluation/Treatment: Yes Reason for Co-Treatment: For patient/therapist safety PT goals addressed during session: Mobility/safety with mobility OT goals addressed during session: ADL's and self-care      AM-PAC PT "6 Clicks" Mobility   Outcome Measure  Help needed turning from your back to your side while in a flat bed without using bedrails?: A Lot Help needed moving from lying on your back to sitting on the side of a flat bed without using bedrails?: A  Lot Help needed moving to and from a bed to a chair (including a wheelchair)?: Total Help needed standing up from a chair using your arms (e.g., wheelchair or bedside chair)?: Total Help needed to walk in hospital room?: Total Help needed climbing 3-5 steps with a railing? : Total 6 Click Score: 8    End of Session   Activity Tolerance: Patient limited by fatigue Patient left: in bed;with call bell/phone within reach;with bed alarm set Nurse Communication: Mobility status;Need for lift equipment PT Visit Diagnosis: Difficulty in walking, not elsewhere classified (R26.2);Muscle weakness  (generalized) (M62.81)     Time: [id number] PT Time Calculation (min) (ACUTE ONLY): 30 min  Charges:  $Therapeutic Activity: 8-22 mins                     Seidy Labreck B. Migdalia Dk PT, DPT Acute Rehabilitation Services Pager [phone] Office [phone]    [place] 04/22/2019, 5:16 PM

## 2019-04-22 NOTE — Progress Notes (Signed)
Inpatient Diabetes Program Recommendations  AACE/ADA: New Consensus Statement on Inpatient Glycemic Control (2015)  Target Ranges:  Prepandial:   less than 140 mg/dL      Peak postprandial:   less than 180 mg/dL (1-2 hours)      Critically ill patients:  140 - 180 mg/dL   Lab Results  Component Value Date   GLUCAP 251 (H) 04/22/2019   HGBA1C 6.7 (H) 04/11/2019    Review of Glycemic Control Results for [name], [name] (MRN [id number]) as of 04/22/2019 13:55  Ref. Range 04/13/2019 16:22 04/29/2019 21:32 04/22/2019 03:48 04/22/2019 08:38 04/22/2019 12:36  Glucose-Capillary Latest Ref Range: 70 - 99 mg/dL 190 (H) 142 (H) 237 (H) 394 (H) 251 (H)   Inpatient Diabetes Program Recommendations:   Consider increase in Levemir to 7 units daily.  Thank you, Nani Gasser. Brylee Mcgreal, RN, MSN, CDE  Diabetes Coordinator Inpatient Glycemic Control Team Team Pager [phone] (8am-5pm) 04/22/2019 1:56 PM

## 2019-04-22 NOTE — Progress Notes (Signed)
Patient ID: [name], male   DOB: 1961-07-17, 58 y.o.   MRN: [id number]  PROGRESS NOTE    [name]  [id number] DOB: 1961-04-22 DOA: 04/17/2019 PCP: Rosita Fire, MD   Brief Narrative:  58 y.o.malewith medical history significant ofsevere autism with elective mutism, schizoaffective disorder, diabetes mellitus type 2, chronic kidney disease, chronic suprapubic catheter for urinary strictures, who presented for CT angiogram of the left leg with Dr. Scot Dock for nonhealing ulcer. However, patient was found to have hemoglobin 7.8 for which the procedure was canceled.  He underwent surgical debridement on February 26, 2019 and was noted to have dropping hemoglobin.  Patient was recently found to have dropping hemoglobin for which Dr. Befekadu/nephrology had started iron infusions.  On admission, vascular surgery was consulted and patient was started on intravenous antibiotics.  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  UC pending Continue IV fluids Continue vancomycin, cefepime SLP on board, recommend regular diet/thin liquids Monitor very closely, transferred to progressive unit  Iron deficiency anemia/anemia of chronic disease/folate deficiency Acute drop in hemoglobin No overt signs of GI bleeding.  FOBT negative.  GI recommended outpatient GI evaluation and follow-up Status post 2 units packed red cells transfusion since admission, will transfuse 1 more unit of PRBC on 04/22/2019  Status post IV Feraheme on  04/11/2019  Folate levels low at 3.8.  Continue supplementation. Vitamin B12 level at 1214 Continue PPI  Acute kidney injury superimposed on chronic kidney disease stage III  History of chronic suprapubic catheter Unclear baseline, last creatinine in 2013, ??new baseline Continue IV fluids Daily BMP  Hypomagnesemia Replace PRN Daily magnesium  Hypertension Continue amlodipine and hydralazine.  Diabetes mellitus type 2 with hyperglycemia and hypoglycemia A1c 6.7.  Still has fluctuating hyperglycemia and hypoglycemia Levemir was discontinued on 04/20/2019 because of extreme hypoglycemia.  Subsequently, his blood sugars have been very high, re-start Levemir and NovoLog Continue CBGs with SSI, hypoglycemic protocol  Hypoalbuminemia Follow nutrition recommendations.  Schizoaffective disorder  Stable Continue Zoloft, Geodon, Remeron  Autism/intellectual disability Patient autistic at baseline and electively mute.  Patient nods his head or shakes his head to answer yes or no.  History of seizure disorder No seizures noted Continue Keppra.   DVT prophylaxis: Heparin Code Status: Full Family Communication: None at bedside Disposition Plan: Back to SNF  Consultants: Vascular surgery/GI  Procedures: Left AKA  Antimicrobials:  Currently on cefepime, vancomycin Vancomycin and Zosyn from 04/23/2019-04/07/2019 Rocephin from 04/17/2019 onwards Flagyl from 04/18/2019 onwards   Subjective: Patient seen and examined at bedside, denies any significant pain.  When asked about how he feels, he reports not good by shaking his head but unable to describe any further.     Objective: Vitals:   04/22/19 1208 04/22/19 1241 04/22/19 1500 04/22/19 1534  BP: (!) 114/53 (!) 114/54 115/60 116/62  Pulse:  95 88 93  Resp: 17 15 (!) 21 15  Temp: 100 F (37.8 C) 98 F (36.7 C) 99.7 F (37.6 C) 99 F (37.2 C)  TempSrc: Oral Oral Oral Oral  SpO2: 95% 95% 98% 94%  Weight:  Height:         Intake/Output Summary (Last 24 hours) at 04/22/2019 1610 Last data filed at 04/22/2019 1200 Gross per 24 hour  Intake 6251.9 ml  Output 1051 ml  Net 5200.9 ml   Filed Weights   04/18/19 0609 04/19/19 0506 04/20/19 0627  Weight: 78.5 kg 80.3 kg 81.6 kg    Examination:  General: NAD, selectively mute  Cardiovascular: S1, S2 present  Respiratory:  Decreased bilateral breath sounds at the bases  Abdomen: Soft, nontender, nondistended, bowel sounds present  Musculoskeletal: Left lower extremity wrapped in Ace, clean/dry/intact  Skin: Normal  Psychiatry:  Unable to assess    Data Reviewed: I have personally reviewed following labs and imaging studies  CBC: Recent Labs  Lab 04/18/19 0409 04/19/19 0403 04/20/19 0707 04/10/2019 0349 04/14/2019 2234 04/22/19 0202  WBC 13.5* 10.9* 11.0* 9.5 12.3* 12.1*  NEUTROABS 9.4* 7.3 7.2 6.1 9.4*  --   HGB 8.8* 9.0* 8.9* 8.9* 7.0* 6.9*  HCT 28.8* 29.4* 29.3* 28.9* 22.9* 22.3*  MCV 85.0 84.2 83.7 84.3 84.2 82.9  PLT 339 336 326 316 284 [id number]   Basic Metabolic Panel: Recent Labs  Lab 04/18/19 0409 04/19/19 0403 04/20/19 0707 04/09/2019 0349 05/04/2019 2234 04/22/19 0202  NA 136 138 140 136 138 138  K 3.2* 4.1 3.7 4.2 3.7 3.7  CL 104 108 110 110 113* 113*  CO2 22 19* 18* 16* 16* 18*  GLUCOSE 387* 364* 192* 497* 213* 248*  BUN 31* 34* 32* 31* 30* 28*  CREATININE 2.56* 2.32* 2.03* 2.15* 2.16* 2.11*  CALCIUM 7.8* 7.9* 7.9* 7.7* 7.7* 7.4*  MG 1.8 1.8 1.7 1.6*  --  1.5*   GFR: Estimated Creatinine Clearance: 39.9 mL/min (A) (by C-G formula based on SCr of 2.11 mg/dL (H)). Liver Function Tests: Recent Labs  Lab 04/19/19 0403  AST 93*  ALT 41  ALKPHOS 108  BILITOT 0.4  PROT 6.1*  ALBUMIN 1.8*   No results for input(s): LIPASE, AMYLASE in the last 168 hours. No results for input(s): AMMONIA in the last 168 hours. Coagulation Profile: No results for input(s): INR, PROTIME in the last 168 hours. Cardiac Enzymes: No results for  input(s): CKTOTAL, CKMB, CKMBINDEX, TROPONINI in the last 168 hours. BNP (last 3 results) No results for input(s): PROBNP in the last 8760 hours. HbA1C: No results for input(s): HGBA1C in the last 72 hours. CBG: Recent Labs  Lab 04/23/2019 2132 04/22/19 0348 04/22/19 0838 04/22/19 1236 04/22/19 1533  GLUCAP 142* 237* 394* 251* 138*   Lipid Profile: No results for input(s): CHOL, HDL, LDLCALC, TRIG, CHOLHDL, LDLDIRECT in the last 72 hours. Thyroid Function Tests: No results for input(s): TSH, T4TOTAL, FREET4, T3FREE, THYROIDAB in the last 72 hours. Anemia Panel: No results for input(s): VITAMINB12, FOLATE, FERRITIN, TIBC, IRON, RETICCTPCT in the last 72 hours. Sepsis Labs: Recent Labs  Lab 04/25/2019 2234 04/22/19 0202  LATICACIDVEN 3.0* 0.9    Recent Results (from the past 240 hour(s))  Culture, Urine     Status: Abnormal   Collection Time: 04/14/19  4:14 AM   Specimen: Urine, Random  Result Value Ref Range Status   Specimen Description URINE, RANDOM  Final   Special Requests   Final    NONE Performed at [facility], [address] [place], [place] [address]    Culture 80,000 COLONIES/mL YEAST (A)  Final   Report Status 04/15/2019 FINAL  Final  Surgical PCR screen     Status: None   Collection Time:  04/14/19  4:14 AM   Specimen: Urine, Suprapubic; Nasal Swab  Result Value Ref Range Status   MRSA, PCR NEGATIVE NEGATIVE Final   Staphylococcus aureus NEGATIVE NEGATIVE Final    Comment: (NOTE) The Xpert SA Assay (FDA approved for NASAL specimens in patients 14 years of age and older), is one component of a comprehensive surveillance program. It is not intended to diagnose infection nor to guide or monitor treatment. Performed at [facility], [place] [address] [place], [place] [address]   Culture, blood (routine x 2)     Status: None (Preliminary result)   Collection Time: 04/15/2019 10:18 PM   Specimen: BLOOD LEFT HAND  Result Value Ref Range Status    Specimen Description BLOOD LEFT HAND  Final   Special Requests   Final    BOTTLES DRAWN AEROBIC AND ANAEROBIC Blood Culture adequate volume   Culture   Final    NO GROWTH < 12 HOURS Performed at [facility], [place] [address] [place], [place] [address]    Report Status PENDING  Incomplete  Culture, blood (routine x 2)     Status: None (Preliminary result)   Collection Time: 04/12/2019 10:25 PM   Specimen: BLOOD RIGHT HAND  Result Value Ref Range Status   Specimen Description BLOOD RIGHT HAND  Final   Special Requests   Final    BOTTLES DRAWN AEROBIC AND ANAEROBIC Blood Culture adequate volume   Culture   Final    NO GROWTH < 12 HOURS Performed at [facility], [place] [address], [place], [place] [address]    Report Status PENDING  Incomplete         Radiology Studies: Dg Chest Port 1 View  Result Date: 05/01/2019 CLINICAL DATA:  Fever. EXAM: PORTABLE CHEST 1 VIEW COMPARISON:  Chest radiograph 04/13/2019 FINDINGS: Development of large right perihilar opacity with volume loss in the right lung. Suspected right pleural effusion. Development of heterogeneous left infrahilar opacities. Heart is normal in size. Unchanged mediastinal contours. No pneumothorax. No acute osseous abnormality. IMPRESSION: 1. Development of large right perihilar opacity, concerning for pneumonia. An element of atelectasis/collapse is also considered in the setting of volume loss. Right pleural effusion. 2. Heterogeneous left lung base opacities may be atelectasis or pneumonia. Electronically Signed   By: Keith Rake M.D.   On: 04/20/2019 22:14        Scheduled Meds: . sodium chloride   Intravenous Once  . amLODipine  10 mg Oral Daily  . Chlorhexidine Gluconate Cloth  6 each Topical Q0600  . feeding supplement (GLUCERNA SHAKE)  237 mL Oral TID BM  . feeding supplement (PRO-STAT SUGAR FREE 64)  30 mL Oral TID WC  . folic acid  1 mg Oral Daily  . heparin  5,000 Units Subcutaneous Q8H  .  hydrALAZINE  25 mg Oral TID  . insulin aspart  0-9 Units Subcutaneous TID WC  . insulin aspart  2 Units Subcutaneous TID WC  . [START ON 04/23/2019] insulin detemir  7 Units Subcutaneous Daily  . levETIRAcetam  250 mg Oral BID  . metroNIDAZOLE  500 mg Oral Q8H  . mirtazapine  15 mg Oral QHS  . multivitamin with minerals  1 tablet Oral Daily  . nutrition supplement (JUVEN)  1 packet Oral BID BM  . pantoprazole  40 mg Oral Q0600  . sertraline  200 mg Oral Daily  . sodium chloride flush  3 mL Intravenous Q12H  . ziprasidone  20 mg Oral  Daily  . ziprasidone  40 mg Oral QHS   Continuous Infusions: . sodium chloride Stopped (04/22/19 0222)  . ceFEPime (MAXIPIME) IV Stopped (04/22/19 0144)  . magnesium sulfate bolus IVPB    . vancomycin 166.7 mL/hr at 04/22/19 0300     LOS: 10 days        Alma Friendly, MD [facility] 04/22/2019, 4:10 PM

## 2019-04-22 NOTE — Evaluation (Signed)
Clinical/Bedside Swallow Evaluation Patient Details  Name: Criston Bokor MRN: [id number] Date of Birth: 13-Jan-1961  Today's Date: 04/22/2019 Time: SLP Start Time (ACUTE ONLY): 1346 SLP Stop Time (ACUTE ONLY): 1401 SLP Time Calculation (min) (ACUTE ONLY): 15 min  Past Medical History:  Past Medical History:  Diagnosis Date  . Autism   . BPH (benign prostatic hyperplasia)   . Depression   . Diabetes mellitus   . Elective mutism   . Gastroparesis   . Hyperglycemia   . Hyperosmolar (nonketotic) coma ([place])   . Hyponatremia   . Mental retardation   . Mental retardation   . Pneumonia   . Renal disorder   . Urinary retention   . UTI (urinary tract infection)    Past Surgical History:  Past Surgical History:  Procedure Laterality Date  . AMPUTATION Left 04/07/2019   Procedure: AMPUTATION ABOVE KNEE LEFT LEG;  Surgeon: Angelia Mould, MD;  Location: [facility] OR;  Service: Vascular;  Laterality: Left;   HPI:  58 y.o.male, SNF resident,with medical history significant forsevere autism with elective mutism, schizoaffective disorder, diabetes mellitus type 2, chronic kidney disease, chronic suprapubic catheter for urinary strictures, admitted with left heel gangrene.  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 MBS from June 2013 with acute dysphagia after hospitalization with multiple intubations.   After surgery, pt noted by nursing to be coughing frequently with meals.     Assessment / Plan / Recommendation Clinical Impression  Pt alert, following commands, participated in clinical swallowing assessment.  Pt able to feed himself.  Presented with adequate oral attention and preparation, there was mild solid residual in oral cavity post swallow.  Swallow response appeared to be brisk.  During pudding then water consumption from a straw, pt demonstrated no s/s of aspiration.  After finishing PO trials and HOB lowered, pt began coughing.  CXR from 8/18 with concerns for pna; BS unremarkable per RN.  Difficult  to discern at bedside is cough is associated with aspiration.  SLP will f/u again next date for reassessment at bedside and to determine if instrumental swallow study is warranted.  D/W RN. Hold tray if pt struggles with dinner (coughing, appearance of choking.) SLP Visit Diagnosis: Dysphagia, unspecified (R13.10)    Aspiration Risk       Diet Recommendation   continue regular diet, thin liquids  Medication Administration: Whole meds with liquid    Other  Recommendations Oral Care Recommendations: Oral care BID   Follow up Recommendations Other (comment)(tba)      Frequency and Duration min 2x/week  1 week       Prognosis Prognosis for Safe Diet Advancement: Good      Swallow Study   General Date of Onset: 04/06/2019 HPI: 58 y.o.male, SNF resident,with medical history significant forsevere autism with elective mutism, schizoaffective disorder, diabetes mellitus type 2, chronic kidney disease, chronic suprapubic catheter for urinary strictures, admitted with left heel gangrene.  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 MBS from June 2013 with moderate dysphagia after acute hospitalization with multiple intubations.   Pt noted by nursing to be coughing frequently with meals.   Type of Study: Bedside Swallow Evaluation Previous Swallow Assessment: see HPI Diet Prior to this Study: Regular;Thin liquids Temperature Spikes Noted: Yes Respiratory Status: Room air History of Recent Intubation: No Behavior/Cognition: Alert;Cooperative Oral Cavity Assessment: Within Functional Limits Oral Care Completed by SLP: No Oral Cavity - Dentition: Adequate natural dentition Vision: Functional for self-feeding Self-Feeding Abilities: Able to feed self Patient Positioning: Upright in bed Baseline Vocal Quality: Hoarse Volitional Cough:  Strong Volitional Swallow: Able to elicit    Oral/Motor/Sensory Function Overall Oral Motor/Sensory Function: Within functional limits   Ice Chips Ice chips: Within functional  limits   Thin Liquid Thin Liquid: Within functional limits    Nectar Thick Nectar Thick Liquid: Not tested   Honey Thick Honey Thick Liquid: Not tested   Puree Puree: Within functional limits   Solid     Solid: Not tested      Juan Quam Laurice 04/22/2019,2:04 PM  Estill Bamberg L. Tivis Ringer, [place] Office number [phone] Pager [phone]

## 2019-04-22 NOTE — Progress Notes (Signed)
CRITICAL VALUE ALERT  Critical Value:  Hgb 6.9  Date & Time Notied:  04/22/2019 0231  Provider Notified: Baltazar Najjar  Orders Received/Actions taken:

## 2019-04-22 NOTE — Progress Notes (Signed)
Occupational Therapy Treatment Patient Details Name: [name] MRN: [id number] DOB: 06-07-1961 Today's Date: 04/22/2019    History of present illness Pt is a 58 y/o male who presents with anemia and L heel wound, found to be gangrenous. PMH significant for autism with elective mutism, schizophrenic disorder, DM, suprapubic catheter.. S/p Left AKA   OT comments  Pt asleep upon arrival, pleasantly awoke to knocking. Pt soiled in bowels upon arrival, required modA+2 for rolling and totalA for pericare. Pt limited to bed level this session secondary to fatigue. Began education about wrapping residual limb as demonstrated by OT/PT. Educated pt on grieving process associated with amputations, phantom limb pain and sensations, and strategies for desensitization. Pt will continue to benefit from skilled OT services to maximize safety and independence with ADL/IADL and functional mobility. Will continue to follow acutely and progress as tolerated.    Follow Up Recommendations  SNF;Supervision/Assistance - 24 hour    Equipment Recommendations  None recommended by OT    Recommendations for Other Services      Precautions / Restrictions Precautions Precautions: Fall Precaution Comments: Elective mutism, suprapubic catheter, skin breakdown in perineal area. Restrictions Weight Bearing Restrictions: No       Mobility Bed Mobility Overal bed mobility: Needs Assistance Bed Mobility: Rolling Rolling: Mod assist;+2 for physical assistance         General bed mobility comments: rolled R<>L with modA+2;totalA for posterior pericare;  Transfers                 General transfer comment: deferred this session    Balance       Sitting balance - Comments: deferred this session                                   ADL either performed or assessed with clinical judgement   ADL Overall ADL's : Needs assistance/impaired                     Lower Body  Dressing: Total assistance Lower Body Dressing Details (indicate cue type and reason): totalA to wrap residual limb Toilet Transfer: Moderate assistance Toilet Transfer Details (indicate cue type and reason): pt incontinent of bowels upon arrival, rolled R<>L for pericare Toileting- Clothing Manipulation and Hygiene: Total assistance;Bed level Toileting - Clothing Manipulation Details (indicate cue type and reason): modA for rolling totalA for posterior pericare        General ADL Comments: limited to bed level this session.      Vision       Perception     Praxis      Cognition Arousal/Alertness: Awake/alert Behavior During Therapy: WFL for tasks assessed/performed Overall Cognitive Status: History of cognitive impairments - at baseline                                 General Comments: discussed grieving process associated with AKA;discussed desensitization and phantom limb pain/sensation        Exercises     Shoulder Instructions       General Comments skin breakdown noted on scrotum;limited to bed level this session secondary to pt fatigue    Pertinent Vitals/ Pain       Pain Assessment: Faces Faces Pain Scale: Hurts little more Pain Location: pt did not specify Pain Descriptors / Indicators: Grimacing Pain Intervention(s): Limited activity within patient's  tolerance;Monitored during session  Home Living                                          Prior Functioning/Environment              Frequency  Min 2X/week        Progress Toward Goals  OT Goals(current goals can now be found in the care plan section)  Progress towards OT goals: Progressing toward goals  Acute Rehab OT Goals Patient Stated Goal: Pt did not state goals this session OT Goal Formulation: With patient Time For Goal Achievement: 04/26/19 Potential to Achieve Goals: Good ADL Goals Pt Will Perform Grooming: with set-up;with supervision;sitting Pt  Will Perform Upper Body Dressing: with set-up;with supervision;sitting Pt Will Transfer to Toilet: with mod assist;stand pivot transfer;bedside commode Pt Will Perform Toileting - Clothing Manipulation and hygiene: with mod assist;sitting/lateral leans;sit to/from stand Additional ADL Goal #1: Pt will perform bed mobility with Min Guard A in preparation for ADLs  Plan Discharge plan remains appropriate    Co-evaluation    PT/OT/SLP Co-Evaluation/Treatment: Yes Reason for Co-Treatment: For patient/therapist safety;To address functional/ADL transfers   OT goals addressed during session: ADL's and self-care      AM-PAC OT "6 Clicks" Daily Activity     Outcome Measure   Help from another person eating meals?: A Little Help from another person taking care of personal grooming?: A Little Help from another person toileting, which includes using toliet, bedpan, or urinal?: Total Help from another person bathing (including washing, rinsing, drying)?: A Lot Help from another person to put on and taking off regular upper body clothing?: A Lot Help from another person to put on and taking off regular lower body clothing?: Total 6 Click Score: 12    End of Session    OT Visit Diagnosis: Unsteadiness on feet (R26.81);Other abnormalities of gait and mobility (R26.89);Muscle weakness (generalized) (M62.81)   Activity Tolerance Patient limited by fatigue;Patient tolerated treatment well   Patient Left in bed;with bed alarm set;with call bell/phone within reach   Nurse Communication Mobility status        Time: [id number] OT Time Calculation (min): 30 min  Charges: OT General Charges $OT Visit: 1 Visit OT Treatments $Self Care/Home Management : 8-22 mins  Dorinda Hill OTR/L Acute Rehabilitation Services Office: [place] 04/22/2019, 3:11 PM

## 2019-04-22 NOTE — Progress Notes (Addendum)
Nutrition Follow-up  INTERVENTION:   -D/C Boost Breeze -Provide Glucerna Shake po TID, each supplement provides 220 kcal and 10 grams of protein -Continue Prostat liquid protein PO 30 ml TID with meals, each supplement provides 100 kcal, 15 grams protein. -Continue Juven Fruit Punch BID, each serving provides 95kcal and 2.5g of protein (amino acids glutamine and arginine)  NUTRITION DIAGNOSIS:   Increased nutrient needs related to wound healing as evidenced by estimated needs.  Ongoing.  GOAL:   Patient will meet greater than or equal to 90% of their needs  Progressing.  MONITOR:   PO intake, Supplement acceptance, Diet advancement, Labs, Weight trends, Skin, I & O's  ASSESSMENT:   [name] is a 58 y.o. male with medical history significant of severe autism with elective mutism, schizoaffective disorder, diabetes mellitus type 2, chronic kidney disease, chronic suprapubic catheter for urinary strictures, who presented for CT angiogram of the left leg with Dr. Bobette Mo for nonhealing ulcer.  However, patient was found to have hemoglobin 7.8 for which the procedure was canceled.  Social worker is present at bedside and provides history as the patient is unable to provide his own.  At baseline patient is wheelchair-bound.  The wound first appeared a couple months ago.  They were not able to get into the wound care center until June 23.  He underwent surgical debridement on June 25.  During that time he was noted to have dropping hemoglobin for which he was referred to Dr. Lowanda Foster of nephrology.  Hemoglobin noted to drop to 6.8, and Dr. Lowanda Foster started him on iron infusions.  His first infusion given on July 7.  Subsequently, patient was admitted to [facility] on the 9th for a urinary tract infection, and on July 10 was transfused 2 units of blood as his hemoglobin was down to 6.3.  Stool guaiacs were reported to be negative at that time.  Patient received the second  iron fusion on the 30th, but had not been formally seen by Dr. Lowanda Foster.  8/18 s/p lt AKA  **RD working remotely**  Patient now on solid diet. Pt is consuming 0-25% of meals per documentation. Pt is drinking supplements.   Admission weight: 169 lbs. Weigh as of 8/17 180 lbs. Will need new weight given lt AKA.  Medications: Folic acid tablet daily, Remeron tablet daily, Multivitamin with minerals daily, IV Mg Sulfate once, IV Zofran PRN Labs reviewed: CBGs: 251-394 Low Mg  Diet Order:   Diet Order            Diet heart healthy/carb modified Room service appropriate? Yes; Fluid consistency: Thin  Diet effective now              EDUCATION NEEDS:   No education needs have been identified at this time  Skin:  Skin Assessment: Skin Integrity Issues: Skin Integrity Issues:: Stage II, Diabetic Ulcer Stage II: sacrum, scrotum Diabetic Ulcer: lt heel  Last BM:  8/17  Height:   Ht Readings from Last 1 Encounters:  04/16/2019 5\' 10"  (1.778 m)    Weight:   Wt Readings from Last 1 Encounters:  04/20/19 81.6 kg    Ideal Body Weight:  75.5 kg  BMI:  Body mass index is 25.83 kg/m.  Estimated Nutritional Needs:   Kcal:  2200-2400  Protein:  110-125 grams  Fluid:  > 2.2 L  Clayton Bibles, MS, RD, LDN  Inpatient Clinical Dietitian Pager: [phone] After Hours Pager: [phone]

## 2019-04-22 NOTE — Progress Notes (Signed)
Pt has temp of 102.6, BP 124/80, HR 93. 1 unit of PRC ordered but not given d/t temp. Horris Latino, MD notified. Will continue to monitor closely.

## 2019-04-23 ENCOUNTER — Inpatient Hospital Stay (HOSPITAL_COMMUNITY): Payer: Medicaid Other

## 2019-04-23 ENCOUNTER — Encounter: Payer: Medicaid Other | Admitting: Vascular Surgery

## 2019-04-23 ENCOUNTER — Encounter (HOSPITAL_COMMUNITY): Payer: Medicaid Other

## 2019-04-23 DIAGNOSIS — I739 Peripheral vascular disease, unspecified: Secondary | ICD-10-CM

## 2019-04-23 DIAGNOSIS — R0609 Other forms of dyspnea: Secondary | ICD-10-CM

## 2019-04-23 DIAGNOSIS — R5082 Postprocedural fever: Secondary | ICD-10-CM

## 2019-04-23 LAB — CBC WITH DIFFERENTIAL/PLATELET
Abs Immature Granulocytes: 0.19 K/uL — ABNORMAL HIGH (ref 0.00–0.07)
Basophils Absolute: 0.1 K/uL (ref 0.0–0.1)
Basophils Relative: 1 %
Eosinophils Absolute: 0.2 K/uL (ref 0.0–0.5)
Eosinophils Relative: 1 %
HCT: 25.5 % — ABNORMAL LOW (ref 39.0–52.0)
Hemoglobin: 7.9 g/dL — ABNORMAL LOW (ref 13.0–17.0)
Immature Granulocytes: 1 %
Lymphocytes Relative: 12 %
Lymphs Abs: 1.8 K/uL (ref 0.7–4.0)
MCH: 25.7 pg — ABNORMAL LOW (ref 26.0–34.0)
MCHC: 31 g/dL (ref 30.0–36.0)
MCV: 83.1 fL (ref 80.0–100.0)
Monocytes Absolute: 1.3 K/uL — ABNORMAL HIGH (ref 0.1–1.0)
Monocytes Relative: 9 %
Neutro Abs: 11.8 K/uL — ABNORMAL HIGH (ref 1.7–7.7)
Neutrophils Relative %: 76 %
Platelets: 297 K/uL (ref 150–400)
RBC: 3.07 MIL/uL — ABNORMAL LOW (ref 4.22–5.81)
RDW: 17.7 % — ABNORMAL HIGH (ref 11.5–15.5)
WBC: 15.4 K/uL — ABNORMAL HIGH (ref 4.0–10.5)
nRBC: 0 % (ref 0.0–0.2)

## 2019-04-23 LAB — BASIC METABOLIC PANEL
Anion gap: 12 (ref 5–15)
BUN: 34 mg/dL — ABNORMAL HIGH (ref 6–20)
CO2: 11 mmol/L — ABNORMAL LOW (ref 22–32)
Calcium: 7.8 mg/dL — ABNORMAL LOW (ref 8.9–10.3)
Chloride: 114 mmol/L — ABNORMAL HIGH (ref 98–111)
Creatinine, Ser: 2.5 mg/dL — ABNORMAL HIGH (ref 0.61–1.24)
GFR calc Af Amer: 32 mL/min — ABNORMAL LOW (ref >60)
GFR calc non Af Amer: 27 mL/min — ABNORMAL LOW (ref >60)
Glucose, Bld: 266 mg/dL — ABNORMAL HIGH (ref 70–99)
Potassium: 4 mmol/L (ref 3.5–5.1)
Sodium: 137 mmol/L (ref 135–145)

## 2019-04-23 LAB — TYPE AND SCREEN
ABO/RH(D): A POS
Antibody Screen: NEGATIVE
Unit division: 0

## 2019-04-23 LAB — BPAM RBC
Blood Product Expiration Date: 202009122359
ISSUE DATE / TIME: 202008191221
Unit Type and Rh: 6200

## 2019-04-23 LAB — STREP PNEUMONIAE URINARY ANTIGEN: Strep Pneumo Urinary Antigen: NEGATIVE

## 2019-04-23 LAB — MAGNESIUM: Magnesium: 1.7 mg/dL (ref 1.7–2.4)

## 2019-04-23 LAB — GLUCOSE, CAPILLARY
Glucose-Capillary: 282 mg/dL — ABNORMAL HIGH (ref 70–99)
Glucose-Capillary: 315 mg/dL — ABNORMAL HIGH (ref 70–99)
Glucose-Capillary: 251 mg/dL — ABNORMAL HIGH (ref 70–99)
Glucose-Capillary: 179 mg/dL — ABNORMAL HIGH (ref 70–99)
Glucose-Capillary: 50 mg/dL — ABNORMAL LOW (ref 70–99)
Glucose-Capillary: 107 mg/dL — ABNORMAL HIGH (ref 70–99)

## 2019-04-23 LAB — URINE CULTURE: Culture: 70000 — AB

## 2019-04-23 LAB — ECHOCARDIOGRAM LIMITED
Height: 70 in
Weight: 3024 oz

## 2019-04-23 MED ORDER — LEVALBUTEROL HCL 0.63 MG/3ML IN NEBU
0.6300 mg | INHALATION_SOLUTION | Freq: Four times a day (QID) | RESPIRATORY_TRACT | Status: DC
  Administered 2019-04-23 – 2019-04-25 (×6): 0.63 mg via RESPIRATORY_TRACT
  Filled 2019-04-23 (×7): qty 3

## 2019-04-23 MED ORDER — ALBUTEROL SULFATE (2.5 MG/3ML) 0.083% IN NEBU
2.5000 mg | INHALATION_SOLUTION | RESPIRATORY_TRACT | Status: DC | PRN
  Administered 2019-04-23: 22:00:00 2.5 mg via RESPIRATORY_TRACT
  Filled 2019-04-23: qty 3

## 2019-04-23 MED ORDER — LEVALBUTEROL HCL 0.63 MG/3ML IN NEBU: 0.6300 mg | INHALATION_SOLUTION | Freq: Four times a day (QID) | RESPIRATORY_TRACT | Status: DC

## 2019-04-23 MED ORDER — FUROSEMIDE 10 MG/ML IJ SOLN
40.0000 mg | Freq: Once | INTRAMUSCULAR | Status: AC
  Administered 2019-04-23: 40 mg via INTRAVENOUS
  Filled 2019-04-23: qty 4

## 2019-04-23 MED ORDER — IPRATROPIUM-ALBUTEROL 0.5-2.5 (3) MG/3ML IN SOLN
3.0000 mL | RESPIRATORY_TRACT | Status: DC | PRN
  Administered 2019-04-24: 05:00:00 3 mL via RESPIRATORY_TRACT
  Filled 2019-04-23: qty 3

## 2019-04-23 MED ORDER — FUROSEMIDE 10 MG/ML IJ SOLN: 20.0000 mg | Freq: Once | INTRAMUSCULAR | Status: DC

## 2019-04-23 NOTE — Progress Notes (Signed)
Only limited echo views done on patient due to patients positioning.

## 2019-04-23 NOTE — Progress Notes (Signed)
Right ABI and TBI completed. Preliminary results in Chart review CV Proc. [facility], [place] 8/20/202 12:56 PM

## 2019-04-23 NOTE — Progress Notes (Addendum)
Patient's CBG levels were 50 tonight.  Patient did not have dinner due to lack of appetite. Provided patient with 240 ml of Orange Juice and peanut butter and crackers.   CBG will be rechecked.

## 2019-04-23 NOTE — Consult Note (Signed)
[name] for Infectious Disease    Date of Admission:  04/08/2019     Total days of antibiotics 14               Reason for Consult: Postop Fever   Referring Provider: Horris Latino Primary Care Provider: Rosita Fire, MD   Assessment/Plan:  [name] is a 58 y/o male with severe autism with elective mutism who has developed postoperative fever and meets criteria for SIRS s/p left knee above knee amputation on 8/18. Source of infection is presently unclear. Blood cultures have been without growth to date and may not be the most accurate as he was on broad spectrum antibiotic therapy at the time. Surgical site appears to be healing without evidence of infection There have been changes to his chest x-ray which are concerning for pleural effusion or developing right middle lobe pneumonia possibly from aspiration. SLP performed barium swallow with results pending.  He has a very weak cough at present on clinical exam and has adventitious lung sounds  He was hypoxic earlier while on room air and now stable with 2L via nasal cannula. These do support a potential pneumonia diagnosis. He has a Stage 2 pressure ulcer on his sacram that does not appear to be infected. He continues on broad spectrum coverage with vancomcyin, cefepime and metronidazole. He has no evidence of previous MRSA infection and nasal PCR negative so will discontinue vancomycin. Appears to have a downward fever curve with the current course and will recommend continuing cefepime and metronidazole.  1. Continue cefepime and metronidazole. 2. Discontinue vancomycin. 3. Monitor fever curve and WBC count  4. Maintain O2 stats as indicated.  Principal Problem:   Postoperative fever Active Problems:   Intellectual disability   Acute kidney injury superimposed on CKD (HCC)   Chronic indwelling Foley catheter   Diabetes mellitus out of control (HCC)   Anemia, blood loss   Leukocytosis   History of seizure   Gangrene  (HCC)   Pressure injury of skin   Folate deficiency   Gas gangrene of foot (HCC)    sodium chloride   Intravenous Once   Chlorhexidine Gluconate Cloth  6 each Topical Q0600   feeding supplement (GLUCERNA SHAKE)  237 mL Oral TID BM   feeding supplement (PRO-STAT SUGAR FREE 64)  30 mL Oral TID WC   folic acid  1 mg Oral Daily   heparin  5,000 Units Subcutaneous Q8H   insulin aspart  0-9 Units Subcutaneous TID WC   insulin aspart  2 Units Subcutaneous TID WC   insulin detemir  7 Units Subcutaneous Daily   levETIRAcetam  250 mg Oral BID   metroNIDAZOLE  500 mg Oral Q8H   mirtazapine  15 mg Oral QHS   multivitamin with minerals  1 tablet Oral Daily   nutrition supplement (JUVEN)  1 packet Oral BID BM   pantoprazole  40 mg Oral Q0600   sertraline  200 mg Oral Daily   sodium chloride flush  3 mL Intravenous Q12H   ziprasidone  20 mg Oral Daily   ziprasidone  40 mg Oral QHS     HPI: [name] is a 58 y.o. male with previous medical history significant for her severe autism with elective mutism, schizoaffective disorder, type 2 diabetes, chronic kidney disease, and chronic suprapubic catheterfor urinary srictures admitted with non-healing ulcer of a couple of month duration and s/p surgical debridement on 02/26/19. He had subsequent admission for UTI on July 9th  at [facility]. Left heel wound was determined to be nonsalvagable and underwent left above knee amputation on 8/18.   POD #1 [name] developed a temperature of 103 of unclear origin. On 8/7 he was started on broad spectrum antimicrobial therapy with vancomycin and piperacillin-tazobactam which was stopped on 8/14 with initiation of Ceftriaxone and metronidazole on 8/15 with addition of vancomycin on 8/18. He is now POD # 2 and continues to have elevated temperatures ranging from 98-102. Wound appears to be healing with no evidence of infection. Blood cultures are without growth to date for 2 days.  Urine culture positive with 70,000 colonies/ml of yeast. This morning he has developed hypoxia with SPO2 of 83% on RA with tachycardia of 103. Wheezing noted on exam. Chest x-ray with mildly increased bilateral opacities concerning for edema or pneumonia. ID has been consulted for evaluation of fever.   Per nursing staff [name] has been having coughing recently with decreased oxygen saturation requiring nasal cannula. [name] denies having pain however has noted that his breathing is slightly more labored. He does have a cough that is recently developed. No diarrhea or abdominal pain. No episodes of vomiting.   Review of Systems: Review of Systems  Unable to perform ROS: Patient nonverbal     Past Medical History:  Diagnosis Date   Autism    BPH (benign prostatic hyperplasia)    Depression    Diabetes mellitus    Elective mutism    Gastroparesis    Hyperglycemia    Hyperosmolar (nonketotic) coma (HCC)    Hyponatremia    Mental retardation    Mental retardation    Pneumonia    Renal disorder    Urinary retention    UTI (urinary tract infection)     Social History   Tobacco Use   Smoking status: Never Smoker   Smokeless tobacco: Never Used  Substance Use Topics   Alcohol use: No   Drug use: No    History reviewed. No pertinent family history.  No Known Allergies  OBJECTIVE: Blood pressure (!) 115/56, pulse 90, temperature 97.9 F (36.6 C), temperature source Oral, resp. rate (!) 21, height 5\' 10"  (1.778 m), weight 85.7 kg, SpO2 95 %.  Physical Exam Constitutional:      General: He is not in acute distress.    Appearance: He is well-developed.     Comments: Lying in bed with head of bed elevated; able to answer questions with yes/no response.   Cardiovascular:     Rate and Rhythm: Normal rate and regular rhythm.     Heart sounds: Normal heart sounds.  Pulmonary:     Effort: Pulmonary effort is normal. Tachypnea present.     Breath  sounds: Rhonchi present. No wheezing.     Comments: Work of breathing increased.  Skin:    General: Skin is warm and dry.  Neurological:     Mental Status: He is alert.     Lab Results Lab Results  Component Value Date   WBC 15.4 (H) 04/23/2019   HGB 7.9 (L) 04/23/2019   HCT 25.5 (L) 04/23/2019   MCV 83.1 04/23/2019   PLT 297 04/23/2019    Lab Results  Component Value Date   CREATININE 2.50 (H) 04/23/2019   BUN 34 (H) 04/23/2019   NA 137 04/23/2019   K 4.0 04/23/2019   CL 114 (H) 04/23/2019   CO2 11 (L) 04/23/2019    Lab Results  Component Value Date   ALT 41 04/19/2019  AST 93 (H) 04/19/2019   ALKPHOS 108 04/19/2019   BILITOT 0.4 04/19/2019     Microbiology: Recent Results (from the past 240 hour(s))  Culture, Urine     Status: Abnormal   Collection Time: 04/14/19  4:14 AM   Specimen: Urine, Random  Result Value Ref Range Status   Specimen Description URINE, RANDOM  Final   Special Requests   Final    NONE Performed at [facility], [address], [place], [place] [address]    Culture 80,000 COLONIES/mL YEAST (A)  Final   Report Status 04/15/2019 FINAL  Final  Surgical PCR screen     Status: None   Collection Time: 04/14/19  4:14 AM   Specimen: Urine, Suprapubic; Nasal Swab  Result Value Ref Range Status   MRSA, PCR NEGATIVE NEGATIVE Final   Staphylococcus aureus NEGATIVE NEGATIVE Final    Comment: (NOTE) The Xpert SA Assay (FDA approved for NASAL specimens in patients 91 years of age and older), is one component of a comprehensive surveillance program. It is not intended to diagnose infection nor to guide or monitor treatment. Performed at [facility], [place] [address] [place], [place] [address]   Culture, blood (routine x 2)     Status: None (Preliminary result)   Collection Time: 04/10/2019 10:18 PM   Specimen: BLOOD LEFT HAND  Result Value Ref Range Status   Specimen Description BLOOD LEFT HAND  Final   Special Requests   Final     BOTTLES DRAWN AEROBIC AND ANAEROBIC Blood Culture adequate volume   Culture   Final    NO GROWTH 2 DAYS Performed at [facility], [place] [address], [place], [place] [address]    Report Status PENDING  Incomplete  Culture, blood (routine x 2)     Status: None (Preliminary result)   Collection Time: 05/04/2019 10:25 PM   Specimen: BLOOD RIGHT HAND  Result Value Ref Range Status   Specimen Description BLOOD RIGHT HAND  Final   Special Requests   Final    BOTTLES DRAWN AEROBIC AND ANAEROBIC Blood Culture adequate volume   Culture   Final    NO GROWTH 2 DAYS Performed at [facility], [place] [address] [place], [place] [address]    Report Status PENDING  Incomplete  Culture, Urine     Status: Abnormal   Collection Time: 04/22/19  8:46 AM   Specimen: Urine, Suprapubic  Result Value Ref Range Status   Specimen Description URINE, SUPRAPUBIC  Final   Special Requests   Final    NONE Performed at [facility], [place] [address], [place], [place] [address]    Culture 70,000 COLONIES/mL YEAST (A)  Final   Report Status 04/23/2019 FINAL  Final  MRSA PCR Screening     Status: None   Collection Time: 04/22/19  3:56 PM   Specimen: Nasal Mucosa; Nasopharyngeal  Result Value Ref Range Status   MRSA by PCR NEGATIVE NEGATIVE Final    Comment:        The GeneXpert MRSA Assay (FDA approved for NASAL specimens only), is one component of a comprehensive MRSA colonization surveillance program. It is not intended to diagnose MRSA infection nor to guide or monitor treatment for MRSA infections. Performed at [facility], [place] [address] [place], [place] [address]      Terri Piedra, [place] [facility] [phone] Pager  04/23/2019  3:06 PM

## 2019-04-23 NOTE — Progress Notes (Signed)
RN called asking for PRN breathing tx for pt due to wheezing. Pt currently eating breakfast on RA. Expiratory wheezing is heard from [facility]. Coarse rhonchi is heard in RUL.  SATs are 83 on RA. HR 103. RR 27. When pt is asked to cough it is weak. Pt put on [place] at 5L with SATs of 92-93%. Speech seen pt on 8/19 and stated they would see pt again today. RN notified.

## 2019-04-23 NOTE — Progress Notes (Signed)
Patient ID: [name], male   DOB: December 11, 1960, 58 y.o.   MRN: [id number]  PROGRESS NOTE    [name]  [id number] DOB: 04-Dec-1960 DOA: 04/27/2019 PCP: Rosita Fire, MD   Brief Narrative:  58 y.o.malewith medical history significant ofsevere autism with elective mutism, schizoaffective disorder, diabetes mellitus type 2, chronic kidney disease, chronic suprapubic catheter for urinary strictures, who presented for CT angiogram of the left leg with Dr. Scot Dock for nonhealing ulcer. However, patient was found to have hemoglobin 7.8 for which the procedure was canceled.  He underwent surgical debridement on February 26, 2019 and was noted to have dropping hemoglobin.  Patient was recently found to have dropping hemoglobin for which Dr. Befekadu/nephrology had started iron infusions.  On admission, vascular surgery was consulted and patient was started on intravenous antibiotics.  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  Noted AKA stump healing nicely  Sepsis likely due to ?HCAP/post-op PNA vs UTI Vs left heel gangrene Currently febrile, with uptrending leukocytosis BC x2 NGTD Chest x-ray showed development of large right perihilar opacity concerning for pneumonia UA showed large leukocytes, rare bacteria, presence of budding yeast, greater than 50 WBC.  UC grew yeast ID consulted, appreciate recs, fever likely due to postop pneumonia, continue cefepime, metronidazole.  Discontinue vancomycin Continue cefepime, metronidazole SLP on board, recommend regular diet/thin liquids Monitor very closely, transferred to progressive unit  Acute hypoxic respiratory failure likely 2/2 HCAP Currently placed on supplemental oxygen, noted hypoxia, increased work of breathing Chest x-ray with bilateral opacity concerning  for edema versus pneumonia Stopped IV fluids, give 1 dose of IV Lasix 40 mg Duo nebs, incentive spirometry, supplemental oxygen Continue supplemental oxygen  Iron deficiency anemia/anemia of chronic disease/folate deficiency Acute drop in hemoglobin No overt signs of GI bleeding.  FOBT negative.  GI recommended outpatient GI evaluation and follow-up Status post 3 units packed red cells transfusion since admission, last on 04/22/2019  Status post IV Feraheme on 04/11/2019  Folate levels low at 3.8.  Continue supplementation. Vitamin B12 level at 1214 Continue PPI  Acute kidney injury superimposed on chronic kidney disease stage III  History of chronic suprapubic catheter Unclear baseline, last creatinine in 2013, ??new baseline Daily BMP, monitor closely  Hypomagnesemia Replace PRN Daily magnesium  Hypertension BP currently soft Hold amlodipine and hydralazine.  Diabetes mellitus type 2  A1c 6.7.  Continue Levemir, NovoLog Continue CBGs with SSI, hypoglycemic protocol  Hypoalbuminemia Follow nutrition recommendations.  Schizoaffective disorder  Stable Continue Zoloft, Geodon, Remeron  Autism/intellectual disability Patient autistic at baseline and electively mute.  Patient nods his head or shakes his head to answer yes or no.  History of seizure disorder No seizures noted Continue Keppra.   DVT prophylaxis: Heparin Code Status: Full Family Communication: None at bedside Disposition Plan: Back to SNF  Consultants:  Vascular surgery GI ID  Procedures: Left AKA  Antimicrobials:  Currently on cefepime, metronidazole Vancomycin and Zosyn from 04/30/2019-04/24/2019 Rocephin from 04/17/2019 onwards Flagyl from 04/18/2019 onwards   Subjective: Patient seen and examined at bedside, noted to be more lethargic today, with some increased work of breathing.     Objective: Vitals:   04/23/19 1041 04/23/19 1100 04/23/19 1223 04/23/19 1438  BP: (!) 112/53  (!) 115/56  119/70  Pulse:   90 98  Resp: 20  (!) 21 15  Temp:  97.9 F (36.6 C)  98.9 F (37.2 C)  TempSrc:  Oral  Oral  SpO2: 95%  95% 96%  Weight:      Height:        Intake/Output Summary (Last 24 hours) at 04/23/2019 1614 Last data filed at 04/23/2019 1335 Gross per 24 hour  Intake 465 ml  Output 1200 ml  Net -735 ml   Filed Weights   04/19/19 0506 04/20/19 0627 04/23/19 0456  Weight: 80.3 kg 81.6 kg 85.7 kg    Examination:  General: NAD, selectively mute  Cardiovascular: S1, S2 present  Respiratory:  Bilateral rhonchi noted, mild expiratory wheezing  Abdomen: Soft, nontender, nondistended, bowel sounds present  Musculoskeletal: Left lower extremity wrapped in Ace, clean/dry/intact, no bilateral pedal edema noted  Skin: Normal  Psychiatry:  Unable to assess    Data Reviewed: I have personally reviewed following labs and imaging studies  CBC: Recent Labs  Lab 04/19/19 0403 04/20/19 0707 04/06/2019 0349 04/20/2019 2234 04/22/19 0202 04/22/19 1814 04/23/19 0413  WBC 10.9* 11.0* 9.5 12.3* 12.1*  --  15.4*  NEUTROABS 7.3 7.2 6.1 9.4*  --   --  11.8*  HGB 9.0* 8.9* 8.9* 7.0* 6.9* 8.1* 7.9*  HCT 29.4* 29.3* 28.9* 22.9* 22.3* 25.5* 25.5*  MCV 84.2 83.7 84.3 84.2 82.9  --  83.1  PLT 336 326 316 284 262  --  [id number]   Basic Metabolic Panel: Recent Labs  Lab 04/19/19 0403 04/20/19 0707 04/17/2019 0349 05/02/2019 2234 04/22/19 0202 04/23/19 0413  NA 138 140 136 138 138 137  K 4.1 3.7 4.2 3.7 3.7 4.0  CL 108 110 110 113* 113* 114*  CO2 19* 18* 16* 16* 18* 11*  GLUCOSE 364* 192* 497* 213* 248* 266*  BUN 34* 32* 31* 30* 28* 34*  CREATININE 2.32* 2.03* 2.15* 2.16* 2.11* 2.50*  CALCIUM 7.9* 7.9* 7.7* 7.7* 7.4* 7.8*  MG 1.8 1.7 1.6*  --  1.5* 1.7   GFR: Estimated Creatinine Clearance: 33.7 mL/min (A) (by C-G formula based on SCr of 2.5 mg/dL (H)). Liver Function Tests: Recent Labs  Lab 04/19/19 0403  AST 93*  ALT 41  ALKPHOS 108  BILITOT 0.4  PROT 6.1*  ALBUMIN  1.8*   No results for input(s): LIPASE, AMYLASE in the last 168 hours. No results for input(s): AMMONIA in the last 168 hours. Coagulation Profile: No results for input(s): INR, PROTIME in the last 168 hours. Cardiac Enzymes: No results for input(s): CKTOTAL, CKMB, CKMBINDEX, TROPONINI in the last 168 hours. BNP (last 3 results) No results for input(s): PROBNP in the last 8760 hours. HbA1C: No results for input(s): HGBA1C in the last 72 hours. CBG: Recent Labs  Lab 04/22/19 2217 04/23/19 0606 04/23/19 0747 04/23/19 1245 04/23/19 1510  GLUCAP 181* 282* 315* 251* 179*   Lipid Profile: No results for input(s): CHOL, HDL, LDLCALC, TRIG, CHOLHDL, LDLDIRECT in the last 72 hours. Thyroid Function Tests: No results for input(s): TSH, T4TOTAL, FREET4, T3FREE, THYROIDAB in the last 72 hours. Anemia Panel: No results for input(s): VITAMINB12, FOLATE, FERRITIN, TIBC, IRON, RETICCTPCT in the last 72 hours. Sepsis Labs: Recent Labs  Lab 04/25/2019 2234 04/22/19 0202  LATICACIDVEN 3.0* 0.9    Recent Results (from the past 240 hour(s))  Culture, Urine     Status: Abnormal   Collection Time: 04/14/19  4:14 AM   Specimen: Urine, Random  Result Value Ref Range Status   Specimen Description URINE, RANDOM  Final   Special Requests   Final  NONE Performed at [facility], [place] [address] [place], [place] [address]    Culture 80,000 COLONIES/mL YEAST (A)  Final   Report Status 04/15/2019 FINAL  Final  Surgical PCR screen     Status: None   Collection Time: 04/14/19  4:14 AM   Specimen: Urine, Suprapubic; Nasal Swab  Result Value Ref Range Status   MRSA, PCR NEGATIVE NEGATIVE Final   Staphylococcus aureus NEGATIVE NEGATIVE Final    Comment: (NOTE) The Xpert SA Assay (FDA approved for NASAL specimens in patients 48 years of age and older), is one component of a comprehensive surveillance program. It is not intended to diagnose infection nor to guide or monitor  treatment. Performed at [facility], [place] [address] [place], [place] [address]   Culture, blood (routine x 2)     Status: None (Preliminary result)   Collection Time: 04/27/2019 10:18 PM   Specimen: BLOOD LEFT HAND  Result Value Ref Range Status   Specimen Description BLOOD LEFT HAND  Final   Special Requests   Final    BOTTLES DRAWN AEROBIC AND ANAEROBIC Blood Culture adequate volume   Culture   Final    NO GROWTH 2 DAYS Performed at [facility], [place] [address] [place], [place] [address]    Report Status PENDING  Incomplete  Culture, blood (routine x 2)     Status: None (Preliminary result)   Collection Time: 04/12/2019 10:25 PM   Specimen: BLOOD RIGHT HAND  Result Value Ref Range Status   Specimen Description BLOOD RIGHT HAND  Final   Special Requests   Final    BOTTLES DRAWN AEROBIC AND ANAEROBIC Blood Culture adequate volume   Culture   Final    NO GROWTH 2 DAYS Performed at [facility], [place] [address], [place], [place] [address]    Report Status PENDING  Incomplete  Culture, Urine     Status: Abnormal   Collection Time: 04/22/19  8:46 AM   Specimen: Urine, Suprapubic  Result Value Ref Range Status   Specimen Description URINE, SUPRAPUBIC  Final   Special Requests   Final    NONE Performed at [facility], [place] [address] [place], [place] [address]    Culture 70,000 COLONIES/mL YEAST (A)  Final   Report Status 04/23/2019 FINAL  Final  MRSA PCR Screening     Status: None   Collection Time: 04/22/19  3:56 PM   Specimen: Nasal Mucosa; Nasopharyngeal  Result Value Ref Range Status   MRSA by PCR NEGATIVE NEGATIVE Final    Comment:        The GeneXpert MRSA Assay (FDA approved for NASAL specimens only), is one component of a comprehensive MRSA colonization surveillance program. It is not intended to diagnose MRSA infection nor to guide or monitor treatment for MRSA infections. Performed at [facility], [place] [address]  [place], [place] [address]          Radiology Studies: Dg Chest Main [facility] 1 View  Result Date: 04/23/2019 CLINICAL DATA:  Fever, wheezing. EXAM: PORTABLE CHEST 1 VIEW COMPARISON:  Radiograph April 21, 2019. FINDINGS: Stable cardiomediastinal silhouette. No pneumothorax is noted. Mildly increased bilateral perihilar opacities are noted concerning for possible edema or pneumonia. Increased mild loculated right pleural effusion is noted. Bony thorax is unremarkable. IMPRESSION: Mildly increased bilateral perihilar opacities are noted concerning for possible edema or pneumonia. Increased mild loculated right pleural effusion is noted. Electronically Signed   By: Jeneen Rinks  Murlean Caller M.D.   On: 04/23/2019 09:48   Dg Chest Port 1 View  Result Date: 04/26/2019 CLINICAL DATA:  Fever. EXAM: PORTABLE CHEST 1 VIEW COMPARISON:  Chest radiograph 04/13/2019 FINDINGS: Development of large right perihilar opacity with volume loss in the right lung. Suspected right pleural effusion. Development of heterogeneous left infrahilar opacities. Heart is normal in size. Unchanged mediastinal contours. No pneumothorax. No acute osseous abnormality. IMPRESSION: 1. Development of large right perihilar opacity, concerning for pneumonia. An element of atelectasis/collapse is also considered in the setting of volume loss. Right pleural effusion. 2. Heterogeneous left lung base opacities may be atelectasis or pneumonia. Electronically Signed   By: Keith Rake M.D.   On: 04/23/2019 22:14   Vas [place] Burnard Bunting With/wo Tbi  Result Date: 04/23/2019 LOWER EXTREMITY DOPPLER STUDY Indications: Peripheral artery disease.  Vascular Interventions: Left AKA. Comparison Study: No previous study available for comparison Performing Technologist: Birdena Crandall, [place]  Examination Guidelines: A complete evaluation includes at minimum, Doppler waveform signals and systolic blood pressure reading at the level of bilateral brachial, anterior tibial, and posterior  tibial arteries, when vessel segments are accessible. Bilateral testing is considered an integral part of a complete examination. Photoelectric Plethysmograph (PPG) waveforms and toe systolic pressure readings are included as required and additional duplex testing as needed. Limited examinations for reoccurring indications may be performed as noted.  ABI Findings: +---------+------------------+-----+---------+----------------+  Right     Rt Pressure (mmHg) Index Waveform  Comment           +---------+------------------+-----+---------+----------------+  Brachial  155                      triphasic                   +---------+------------------+-----+---------+----------------+  PTA       255                1.65  biphasic  non compressible  +---------+------------------+-----+---------+----------------+  DP        255                1.65  triphasic non compressible  +---------+------------------+-----+---------+----------------+  Great Toe 51                 0.33                              +---------+------------------+-----+---------+----------------+ +---------+------------------+-----+---------+-------+  Left      Lt Pressure (mmHg) Index Waveform  Comment  +---------+------------------+-----+---------+-------+  Brachial  115                      triphasic          +---------+------------------+-----+---------+-------+  PTA                                          AKA      +---------+------------------+-----+---------+-------+  DP                                           AKA      +---------+------------------+-----+---------+-------+  Great Toe  AKA      +---------+------------------+-----+---------+-------+ +-------+-----------+-----------+------------+------------+  ABI/TBI Today's ABI Today's TBI Previous ABI Previous TBI  +-------+-----------+-----------+------------+------------+  Right   1.65        0.33        no previous  no previous    +-------+-----------+-----------+------------+------------+  Left    AKA                     no previous  no previous   +-------+-----------+-----------+------------+------------+ Arterial wall calcification precludes accurate ankle pressures and ABIs.  Summary: Right: Resting right ankle-brachial index indicates noncompressible right lower extremity arteries.The right toe-brachial index is abnormal. Left: Left Above the knee amputation.  *See table(s) above for measurements and observations.  Electronically signed by Servando Snare MD on 04/23/2019 at 1:25:35 PM.    Final         Scheduled Meds:  sodium chloride   Intravenous Once   Chlorhexidine Gluconate Cloth  6 each Topical Q0600   feeding supplement (GLUCERNA SHAKE)  237 mL Oral TID BM   feeding supplement (PRO-STAT SUGAR FREE 64)  30 mL Oral TID WC   folic acid  1 mg Oral Daily   heparin  5,000 Units Subcutaneous Q8H   insulin aspart  0-9 Units Subcutaneous TID WC   insulin aspart  2 Units Subcutaneous TID WC   insulin detemir  7 Units Subcutaneous Daily   levETIRAcetam  250 mg Oral BID   metroNIDAZOLE  500 mg Oral Q8H   mirtazapine  15 mg Oral QHS   multivitamin with minerals  1 tablet Oral Daily   nutrition supplement (JUVEN)  1 packet Oral BID BM   pantoprazole  40 mg Oral Q0600   sertraline  200 mg Oral Daily   sodium chloride flush  3 mL Intravenous Q12H   ziprasidone  20 mg Oral Daily   ziprasidone  40 mg Oral QHS   Continuous Infusions:  ceFEPime (MAXIPIME) IV 2 g (04/23/19 1251)   magnesium sulfate bolus IVPB       LOS: 11 days        Alma Friendly, MD [facility] 04/23/2019, 4:14 PM

## 2019-04-23 NOTE — Plan of Care (Signed)
  Problem: Clinical Measurements: Goal: Will remain free from infection Outcome: Progressing Afebrile for shift. Left Stump with staples with no signs or symptoms of infection Goal: Respiratory complications will improve Outcome: Progressing Desaturated to 80's; Required oxygen therapy @ 5l during shift. Wean to 2L / min Goal: Cardiovascular complication will be avoided Outcome: Progressing Remains tachycardic on the monitor most of time   Problem: Nutrition: Goal: Adequate nutrition will be maintained Outcome: Progressing Speech therapist revaluated. Underwent Barium Swallow Study. No diet change   Problem: Elimination: Goal: Will not experience complications related to urinary retention Outcome: Progressing Suprapubic catheter patent. Bag leaking and changed

## 2019-04-23 NOTE — Progress Notes (Signed)
VASCULAR SURGERY ASSESSMENT & PLAN:   POD 2 S/P: LEFT AKA:  I changed his dressing this morning.  The amputation site looks fine with no evidence of infection.  Begin daily dressing changes.  I have ordered these.  PERIPHERAL VASCULAR DISEASE: He has no wounds on the right foot but I have obtained noninvasive arterial studies on the right to have as a baseline.  We will need to follow this foot closely as an outpatient.  FEVER: The left above-the-knee amputation site does not appear to be a source for his fever.  I inspected this this morning and there was no erythema or drainage.  There were no wounds on his right foot.  He continues intravenous antibiotics.  He is on intravenous Maxipime, Flagyl, and Vanco.  We will need to look for other sources of infection.  DVT PROPHYLAXIS: He is on subcu heparin  SUBJECTIVE:   No complaints this morning.  His pain is under good control.  PHYSICAL EXAM:   Vitals:   04/22/19 2350 04/23/19 0456 04/23/19 0621 04/23/19 0623  BP:    (!) 105/58  Pulse:   (!) 101 (!) 101  Resp:      Temp: 100.1 F (37.8 C)  (!) 101.3 F (38.5 C)   TempSrc: Oral  Oral   SpO2:   90% 90%  Weight:  85.7 kg    Height:       I inspected his left above-the-knee amputation and this looks fine.  There are no signs of infection.  There is no erythema or drainage.  LABS:   Lab Results  Component Value Date   WBC 15.4 (H) 04/23/2019   HGB 7.9 (L) 04/23/2019   HCT 25.5 (L) 04/23/2019   MCV 83.1 04/23/2019   PLT 297 04/23/2019   Lab Results  Component Value Date   CREATININE 2.50 (H) 04/23/2019   CBG (last 3)  Recent Labs    04/22/19 2217 04/23/19 0606 04/23/19 0747  GLUCAP 181* 282* 315*    PROBLEM LIST:    Principal Problem:   Anemia, blood loss Active Problems:   Intellectual disability   Acute kidney injury superimposed on CKD (HCC)   Chronic indwelling Foley catheter   Diabetes mellitus out of control (HCC)   Leukocytosis   History of  seizure   Gangrene ([place])   Pressure injury of skin   Folate deficiency   Gas gangrene of foot ([facility] [place])   CURRENT MEDS:   . sodium chloride   Intravenous Once  . amLODipine  10 mg Oral Daily  . Chlorhexidine Gluconate Cloth  6 each Topical Q0600  . feeding supplement (GLUCERNA SHAKE)  237 mL Oral TID BM  . feeding supplement (PRO-STAT SUGAR FREE 64)  30 mL Oral TID WC  . folic acid  1 mg Oral Daily  . heparin  5,000 Units Subcutaneous Q8H  . hydrALAZINE  25 mg Oral TID  . insulin aspart  0-9 Units Subcutaneous TID WC  . insulin aspart  2 Units Subcutaneous TID WC  . insulin detemir  7 Units Subcutaneous Daily  . levETIRAcetam  250 mg Oral BID  . metroNIDAZOLE  500 mg Oral Q8H  . mirtazapine  15 mg Oral QHS  . multivitamin with minerals  1 tablet Oral Daily  . nutrition supplement (JUVEN)  1 packet Oral BID BM  . pantoprazole  40 mg Oral Q0600  . sertraline  200 mg Oral Daily  . sodium chloride flush  3 mL Intravenous Q12H  .  ziprasidone  20 mg Oral Daily  . ziprasidone  40 mg Oral QHS    [name] Beeper: [id number] Office: [phone] 04/23/2019

## 2019-04-23 NOTE — Progress Notes (Signed)
Patient's temp I up again to 101.3. I have given him Tylenol and placed ice packs under armpits.  I will keep monitoring patient.

## 2019-04-23 NOTE — Progress Notes (Signed)
Right lower extremity arterial duplex completed. Preliminary results in Chart review CV Proc. [place] Yaa Donnellan,RVS 7:16 PM 04/23/2019

## 2019-04-23 NOTE — Progress Notes (Signed)
Modified Barium Swallow Progress Note  Patient Details  Name: [name] MRN: [id number] Date of Birth: Feb 16, 1961  Today's Date: 04/23/2019  Modified Barium Swallow completed.  Full report located under Chart Review in the Imaging Section.  Brief recommendations include the following:  Clinical Impression  Paient presents with a mild oral and a mild-moderate pharyngeal dysphagia. During oral phase, patient exhibited mastication delay with regular solids and delay in anterior to posterior transit of puree and regular solids boluses with piecemeal swallowing. Pharyngeal phase consisted of delays in swallow initiation to level of vallecular sinus with all tested boluses and flash penetration with thin liquids via cup or straw sip, which occured approximately 50% of the time, but with full clearance of penetrate.    Swallow Evaluation Recommendations       SLP Diet Recommendations: Regular solids;Thin liquid   Liquid Administration via: Cup;Straw   Medication Administration: Whole meds with liquid   Supervision: Staff to assist with self feeding;Full supervision/cueing for compensatory strategies   Compensations: Minimize environmental distractions;Slow rate;Small sips/bites   Postural Changes: Seated upright at 90 degrees             Sonia Baller, MA, CCC-SLP Speech Therapy [facility] Acute Rehab Pager: [phone]

## 2019-04-23 NOTE — Significant Event (Signed)
Rapid Response Event Note  Overview:Called to room d/t respiratory distress. Time Called: 2215 Arrival Time: 2218 Event Type: Respiratory  Initial Focused Assessment: Pt laying in bed with eyes close, skin warm to touch, shivering. Pt in mild respiratory distress with congested breathing. Pt will open eyes and follow commands but will not speak(mute at baseline). T-99.8, HR-103(ST), BP-110/65, RR-26, SpO2-99% on 3L [place]. Lungs-crackles with exp wheezing.   Interventions: Duo-neb(already given PTA RRT) Suction set up in room and pt deep suctioned IV team to bedside-New PIV placed Lasix 40mg  IV x 1  Plan of Care (if not transferred): Continue to monitor pt, document response to lasix. Call RRT if further assistance needed. Event Summary: Name of Physician Notified: Bodenheimer, NP at (PTA RRT)    at          Fajardo, Carren Rang

## 2019-04-23 NOTE — Progress Notes (Signed)
  Speech Language Pathology Treatment: Dysphagia  Patient Details Name: Jr Kovacich MRN: [id number] DOB: 08/29/1961 Today's Date: 04/23/2019 Time: 1110-1130 SLP Time Calculation (min) (ACUTE ONLY): 20 min  Assessment / Plan / Recommendation Clinical Impression  Patient tolerated puree solids, regular solids, thin liquids well, however he started to exhibit delayed throat clearing and coughing with increased RR. Oral cavity was clear of PO residuals post swallows. Will proceed with MBS today to r/o aspiration as cause or contributor to his respiratory issues.    HPI HPI: 58 y.o.male, SNF resident,with medical history significant forsevere autism with elective mutism, schizoaffective disorder, diabetes mellitus type 2, chronic kidney disease, chronic suprapubic catheter for urinary strictures, admitted with left heel gangrene.  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 MBS from June 2013 with moderate dysphagia after acute hospitalization with multiple intubations.   Pt noted by nursing to be coughing frequently with meals.        SLP Plan  Continue with current plan of care;MBS       Recommendations  Diet recommendations: Regular;Thin liquid Liquids provided via: Cup;Straw Medication Administration: Whole meds with liquid Supervision: Staff to assist with self feeding;Patient able to self feed;Full supervision/cueing for compensatory strategies Compensations: Minimize environmental distractions;Slow rate;Small sips/bites Postural Changes and/or Swallow Maneuvers: Seated upright 90 degrees                Oral Care Recommendations: Oral care BID Follow up Recommendations: Other (comment)(TBD) SLP Visit Diagnosis: Dysphagia, unspecified (R13.10) Plan: Continue with current plan of care;MBS       GO                Dannial Monarch 04/23/2019, 3:47 PM   Sonia Baller, MA, CCC-SLP Speech Therapy [facility] Acute Rehab Pager: [phone]

## 2019-04-24 ENCOUNTER — Inpatient Hospital Stay (HOSPITAL_COMMUNITY): Payer: Medicaid Other

## 2019-04-24 DIAGNOSIS — N189 Chronic kidney disease, unspecified: Secondary | ICD-10-CM

## 2019-04-24 DIAGNOSIS — J189 Pneumonia, unspecified organism: Secondary | ICD-10-CM

## 2019-04-24 DIAGNOSIS — N179 Acute kidney failure, unspecified: Secondary | ICD-10-CM

## 2019-04-24 DIAGNOSIS — J9589 Other postprocedural complications and disorders of respiratory system, not elsewhere classified: Secondary | ICD-10-CM

## 2019-04-24 DIAGNOSIS — I5031 Acute diastolic (congestive) heart failure: Secondary | ICD-10-CM

## 2019-04-24 DIAGNOSIS — E8770 Fluid overload, unspecified: Secondary | ICD-10-CM

## 2019-04-24 DIAGNOSIS — J9601 Acute respiratory failure with hypoxia: Secondary | ICD-10-CM

## 2019-04-24 DIAGNOSIS — R509 Fever, unspecified: Secondary | ICD-10-CM

## 2019-04-24 LAB — PROTEIN / CREATININE RATIO, URINE
Creatinine, Urine: 23.55 mg/dL
Protein Creatinine Ratio: 2.63 mg/mg{Cre} — ABNORMAL HIGH (ref 0.00–0.15)
Total Protein, Urine: 62 mg/dL

## 2019-04-24 LAB — BLOOD GAS, ARTERIAL
Acid-base deficit: 12.3 mmol/L — ABNORMAL HIGH (ref 0.0–2.0)
Bicarbonate: 13.6 mmol/L — ABNORMAL LOW (ref 20.0–28.0)
Drawn by: 535471
O2 Content: 4 L/min
O2 Saturation: 71 %
Patient temperature: 98.6
pCO2 arterial: 32.6 mmHg (ref 32.0–48.0)
pH, Arterial: 7.243 — ABNORMAL LOW (ref 7.350–7.450)
pO2, Arterial: 40.5 mmHg — ABNORMAL LOW (ref 83.0–108.0)

## 2019-04-24 LAB — BASIC METABOLIC PANEL
Anion gap: 12 (ref 5–15)
Anion gap: 12 (ref 5–15)
Anion gap: 13 (ref 5–15)
BUN: 40 mg/dL — ABNORMAL HIGH (ref 6–20)
BUN: 49 mg/dL — ABNORMAL HIGH (ref 6–20)
BUN: 47 mg/dL — ABNORMAL HIGH (ref 6–20)
CO2: 12 mmol/L — ABNORMAL LOW (ref 22–32)
CO2: 13 mmol/L — ABNORMAL LOW (ref 22–32)
CO2: 14 mmol/L — ABNORMAL LOW (ref 22–32)
Calcium: 7.8 mg/dL — ABNORMAL LOW (ref 8.9–10.3)
Calcium: 7.8 mg/dL — ABNORMAL LOW (ref 8.9–10.3)
Calcium: 8 mg/dL — ABNORMAL LOW (ref 8.9–10.3)
Chloride: 114 mmol/L — ABNORMAL HIGH (ref 98–111)
Chloride: 114 mmol/L — ABNORMAL HIGH (ref 98–111)
Chloride: 115 mmol/L — ABNORMAL HIGH (ref 98–111)
Creatinine, Ser: 2.94 mg/dL — ABNORMAL HIGH (ref 0.61–1.24)
Creatinine, Ser: 3.33 mg/dL — ABNORMAL HIGH (ref 0.61–1.24)
Creatinine, Ser: 3.47 mg/dL — ABNORMAL HIGH (ref 0.61–1.24)
GFR calc Af Amer: 26 mL/min — ABNORMAL LOW (ref >60)
GFR calc Af Amer: 23 mL/min — ABNORMAL LOW (ref >60)
GFR calc Af Amer: 21 mL/min — ABNORMAL LOW (ref >60)
GFR calc non Af Amer: 23 mL/min — ABNORMAL LOW (ref >60)
GFR calc non Af Amer: 19 mL/min — ABNORMAL LOW (ref >60)
GFR calc non Af Amer: 18 mL/min — ABNORMAL LOW (ref >60)
Glucose, Bld: 164 mg/dL — ABNORMAL HIGH (ref 70–99)
Glucose, Bld: 215 mg/dL — ABNORMAL HIGH (ref 70–99)
Glucose, Bld: 148 mg/dL — ABNORMAL HIGH (ref 70–99)
Potassium: 4 mmol/L (ref 3.5–5.1)
Potassium: 6 mmol/L — ABNORMAL HIGH (ref 3.5–5.1)
Potassium: 3.7 mmol/L (ref 3.5–5.1)
Sodium: 138 mmol/L (ref 135–145)
Sodium: 139 mmol/L (ref 135–145)
Sodium: 142 mmol/L (ref 135–145)

## 2019-04-24 LAB — LEGIONELLA PNEUMOPHILA SEROGP 1 UR AG: L. pneumophila Serogp 1 Ur Ag: NEGATIVE

## 2019-04-24 LAB — POCT I-STAT 7, (LYTES, BLD GAS, ICA,H+H)
Acid-base deficit: 11 mmol/L — ABNORMAL HIGH (ref 0.0–2.0)
Acid-base deficit: 9 mmol/L — ABNORMAL HIGH (ref 0.0–2.0)
Bicarbonate: 13.4 mmol/L — ABNORMAL LOW (ref 20.0–28.0)
Bicarbonate: 15 mmol/L — ABNORMAL LOW (ref 20.0–28.0)
Calcium, Ion: 1.21 mmol/L (ref 1.15–1.40)
Calcium, Ion: 1.2 mmol/L (ref 1.15–1.40)
HCT: 27 % — ABNORMAL LOW (ref 39.0–52.0)
HCT: 22 % — ABNORMAL LOW (ref 39.0–52.0)
Hemoglobin: 9.2 g/dL — ABNORMAL LOW (ref 13.0–17.0)
Hemoglobin: 7.5 g/dL — ABNORMAL LOW (ref 13.0–17.0)
O2 Saturation: 85 %
O2 Saturation: 83 %
Patient temperature: 98.6
Patient temperature: 102.5
Potassium: 4.1 mmol/L (ref 3.5–5.1)
Potassium: 3.7 mmol/L (ref 3.5–5.1)
Sodium: 142 mmol/L (ref 135–145)
Sodium: 144 mmol/L (ref 135–145)
TCO2: 14 mmol/L — ABNORMAL LOW (ref 22–32)
TCO2: 16 mmol/L — ABNORMAL LOW (ref 22–32)
pCO2 arterial: 25.3 mmHg — ABNORMAL LOW (ref 32.0–48.0)
pCO2 arterial: 27.8 mmHg — ABNORMAL LOW (ref 32.0–48.0)
pH, Arterial: 7.331 — ABNORMAL LOW (ref 7.350–7.450)
pH, Arterial: 7.349 — ABNORMAL LOW (ref 7.350–7.450)
pO2, Arterial: 52 mmHg — ABNORMAL LOW (ref 83.0–108.0)
pO2, Arterial: 55 mmHg — ABNORMAL LOW (ref 83.0–108.0)

## 2019-04-24 LAB — GLUCOSE, CAPILLARY
Glucose-Capillary: 137 mg/dL — ABNORMAL HIGH (ref 70–99)
Glucose-Capillary: 166 mg/dL — ABNORMAL HIGH (ref 70–99)
Glucose-Capillary: 169 mg/dL — ABNORMAL HIGH (ref 70–99)
Glucose-Capillary: 195 mg/dL — ABNORMAL HIGH (ref 70–99)
Glucose-Capillary: 146 mg/dL — ABNORMAL HIGH (ref 70–99)
Glucose-Capillary: 160 mg/dL — ABNORMAL HIGH (ref 70–99)

## 2019-04-24 LAB — CBC WITH DIFFERENTIAL/PLATELET
Abs Immature Granulocytes: 0.1 10*3/uL — ABNORMAL HIGH (ref 0.00–0.07)
Basophils Absolute: 0.1 10*3/uL (ref 0.0–0.1)
Basophils Relative: 1 %
Eosinophils Absolute: 0.1 10*3/uL (ref 0.0–0.5)
Eosinophils Relative: 1 %
HCT: 23.9 % — ABNORMAL LOW (ref 39.0–52.0)
Hemoglobin: 7.5 g/dL — ABNORMAL LOW (ref 13.0–17.0)
Immature Granulocytes: 1 %
Lymphocytes Relative: 5 %
Lymphs Abs: 0.6 10*3/uL — ABNORMAL LOW (ref 0.7–4.0)
MCH: 25.9 pg — ABNORMAL LOW (ref 26.0–34.0)
MCHC: 31.4 g/dL (ref 30.0–36.0)
MCV: 82.4 fL (ref 80.0–100.0)
Monocytes Absolute: 0.8 10*3/uL (ref 0.1–1.0)
Monocytes Relative: 6 %
Neutro Abs: 11 10*3/uL — ABNORMAL HIGH (ref 1.7–7.7)
Neutrophils Relative %: 86 %
Platelets: 320 10*3/uL (ref 150–400)
RBC: 2.9 MIL/uL — ABNORMAL LOW (ref 4.22–5.81)
RDW: 18.1 % — ABNORMAL HIGH (ref 11.5–15.5)
WBC: 12.7 10*3/uL — ABNORMAL HIGH (ref 4.0–10.5)
nRBC: 0 % (ref 0.0–0.2)

## 2019-04-24 LAB — TSH: TSH: 1.313 u[IU]/mL (ref 0.350–4.500)

## 2019-04-24 LAB — STREP PNEUMONIAE URINARY ANTIGEN: Strep Pneumo Urinary Antigen: NEGATIVE

## 2019-04-24 LAB — T4, FREE: Free T4: 0.93 ng/dL (ref 0.61–1.12)

## 2019-04-24 LAB — BRAIN NATRIURETIC PEPTIDE: B Natriuretic Peptide: 1044.4 pg/mL — ABNORMAL HIGH (ref 0.0–100.0)

## 2019-04-24 MED ORDER — ZIPRASIDONE MESYLATE 20 MG IM SOLR
40.0000 mg | Freq: Every day | INTRAMUSCULAR | Status: DC
  Filled 2019-04-24 (×2): qty 40

## 2019-04-24 MED ORDER — ZIPRASIDONE MESYLATE 20 MG IM SOLR
20.0000 mg | Freq: Once | INTRAMUSCULAR | Status: AC
  Administered 2019-04-24: 17:00:00 20 mg via INTRAMUSCULAR
  Filled 2019-04-24: qty 20

## 2019-04-24 MED ORDER — SODIUM CHLORIDE 0.9 % IV SOLN
INTRAVENOUS | Status: DC | PRN
  Administered 2019-04-24 – 2019-04-27 (×4): 250 mL via INTRAVENOUS

## 2019-04-24 MED ORDER — STERILE WATER FOR INJECTION IV SOLN
INTRAVENOUS | Status: DC
  Administered 2019-04-24: 12:00:00 via INTRAVENOUS
  Filled 2019-04-24 (×2): qty 850

## 2019-04-24 MED ORDER — STERILE WATER FOR INJECTION IV SOLN
INTRAVENOUS | Status: DC
  Administered 2019-04-25: 12:00:00 via INTRAVENOUS
  Filled 2019-04-24: qty 850

## 2019-04-24 MED ORDER — PANTOPRAZOLE SODIUM 40 MG IV SOLR
40.0000 mg | INTRAVENOUS | Status: DC
  Administered 2019-04-25 – 2019-04-27 (×4): 40 mg via INTRAVENOUS
  Filled 2019-04-24 (×4): qty 40

## 2019-04-24 MED ORDER — FUROSEMIDE 10 MG/ML IJ SOLN
40.0000 mg | Freq: Two times a day (BID) | INTRAMUSCULAR | Status: DC
  Administered 2019-04-24 (×2): 40 mg via INTRAVENOUS
  Filled 2019-04-24 (×2): qty 4

## 2019-04-24 MED ORDER — SODIUM CHLORIDE 0.9 % IV SOLN
2.0000 g | INTRAVENOUS | Status: DC
  Administered 2019-04-25 – 2019-04-26 (×2): 2 g via INTRAVENOUS
  Filled 2019-04-24 (×2): qty 2

## 2019-04-24 MED ORDER — METRONIDAZOLE IN NACL 5-0.79 MG/ML-% IV SOLN
500.0000 mg | Freq: Three times a day (TID) | INTRAVENOUS | Status: DC
  Administered 2019-04-24 – 2019-04-26 (×6): 500 mg via INTRAVENOUS
  Filled 2019-04-24 (×6): qty 100

## 2019-04-24 MED ORDER — LORAZEPAM 2 MG/ML IJ SOLN
1.0000 mg | INTRAMUSCULAR | Status: DC | PRN
  Administered 2019-04-24 – 2019-04-26 (×2): 1 mg via INTRAVENOUS
  Filled 2019-04-24 (×2): qty 1

## 2019-04-24 MED ORDER — SODIUM BICARBONATE 8.4 % IV SOLN
50.0000 meq | Freq: Once | INTRAVENOUS | Status: AC
  Administered 2019-04-24: 15:00:00 50 meq via INTRAVENOUS
  Filled 2019-04-24: qty 50

## 2019-04-24 MED ORDER — SODIUM CHLORIDE 0.9 % IV SOLN
250.0000 mg | Freq: Two times a day (BID) | INTRAVENOUS | Status: DC
  Administered 2019-04-24 – 2019-04-27 (×7): 250 mg via INTRAVENOUS
  Filled 2019-04-24 (×9): qty 2.5

## 2019-04-24 MED ORDER — FUROSEMIDE 10 MG/ML IJ SOLN: 40.0000 mg | Freq: Every day | INTRAMUSCULAR | Status: DC

## 2019-04-24 NOTE — Progress Notes (Signed)
MD was notify about patient's unable to breath, breathing treatment was given already, and lungs sound wet.  Patient still sounding congested with a weak cough and shacking more than usual.  Temperature is being monitored.   Per [facility]ad MD, ordered another dose of Lasix for tonight.    Rapid Response was called to assess patient.  Agreed about dose of lasix to be given and was able to check test done earlier in the day.     Lasix given to patient and I will keep monitoring patient.

## 2019-04-24 NOTE — Consult Note (Signed)
NAME:  [name], MRN:  [id number], DOB:  04/21/1961, LOS: 12 ADMISSION DATE:  04/27/2019, CONSULTATION DATE:  04/24/19 REFERRING MD: Dr. Horris Latino , CHIEF COMPLAINT:   Acute hypoxic respiratory failure  Brief History   58 y/o M admitted 8/7 for gangrenous, non-healing L foot ulcer, eventually underwent L AKA, developed likely aspiration PNA.  PCCM consulted for worsening respiratory status requiring Bipap.  History of present illness   58 y.o. M with PMH of severe autism and elective mutism, anemia on iron infusions outpatient, Type 2 DM, CKD, Schizoaffective disorder, and suprapubic catheter for urinary strictures, wheelchair bound who was admitted on 04/18/2019 for non-healing L heel ulcer.  This initially began in June 2020 per notes and  was previously treated outpatient and underwent surgical debridement on 6/25.   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 Scot Dock.  He has been treated with IV abx, noted to have some fevers and chills over the last 24hrs and CXR showed possible R opacity. Seen by ID, concern for possible aspiration and was continued on Cefepime and Flagyl.  He has also been anemic since admission, received 2 units PRBC's on 8/7 and one unit on 8/19.   Today, pt noted to be febrile to 101.9 and was having worsening respiratory distress and required Bipap.  He was not hypoxic by pulse ox, but  ABG concerning for PO2 of 40, so PCCM was consulted.  Past Medical History  Schizoaffective Disorder, severe autism and selective mutism, CKD, indwelling suprapubic catheter  Significant Hospital Events   04/26/2019 Admitted to hospitalists 8/18 L AKA 8/21 Respiratory distress, txfr to ICU  Consults:  8/7 Vascular Surgery  8/20 Infectious Disease 8/21 PCCM  Procedures:  8/18 L AKA  Significant Diagnostic Tests:  8/20 Echocardiogram>>EF 60-65%, unable to assess RV systolic pressure, but normal RV systolic function [id number] CXR>>patchy bilateral  infiltrates increased since CXR on 8/19 8/21 Renal US>>   Micro Data:  8/7 Sars-CoV-2>>neg 8/12 BCx2>>negative  8/18 Sars-CoV-2>>negative 8/18 BCx2>> 8/19 UC>>70,000 colonies yeast 8/19 MRSA>>neg  Antimicrobials:  Zosyn 8/7>8/13 Vancomycin 8/7>8/11, 8/18> Ceftriaxone 8/14>8/18 Flagyl 8/15>8/20 Cefepime 8/18>8/20   Interim history/subjective:  Pt with increasing tachypnea and respiratory distress, initiated on Bipap and transferred to the ICU.  Currently stable on Bipap 12/6  Objective   Blood pressure 112/60, pulse 96, temperature 99.7 F (37.6 C), temperature source Axillary, resp. rate (!) 22, height 5\' 10"  (1.778 m), weight 81.6 kg, SpO2 100 %. on Bipap 12/6        Intake/Output Summary (Last 24 hours) at 04/24/2019 1112 Last data filed at 04/24/2019 0446 Gross per 24 hour  Intake 1157 ml  Output 1900 ml  Net -743 ml   Filed Weights   04/20/19 0627 04/23/19 0456 04/24/19 0433  Weight: 81.6 kg 85.7 kg 81.6 kg    General:  Elderly, chronically ill-appearing M in no acute distress HEENT: MM pink/moist Neuro: opens eyes to command, not otherwise interactive CV: s1s2, regular rate and rhythm, no m/r/g PULM:  Tachypneic, not in severe respiratory distress and no accessory muscle use, basilar rhonchi bilaterally GI: soft, bsx4 active  Extremities: s.p L AKA warm/dry, no edema  Skin: no rashes or lesions   Resolved Hospital Problem list   L heel ulcer  Assessment & Plan:    Acute hypoxic respiratory failure likely secondary to volume overload in combination with aspiration pneumonia  -Pneumonia likely secondary to aspiration, ID following, though febrile, leukocytosis improving -BC have been  negative thus far -Echo with EF of 60-65 %, worsening renal function, IVF and transfusion likely contributing -BNP >1,000 P: -Continue Cefepime and Flagyl per ID recs -Lasix 40mg  bid, monitor I&O -He is clinically improving on Bipap, monitor closely in the ICU, if able to  wean may be able to transfer back to the floor tomorrow -NPO, speech has been consulted    Acute on chronic renal failure and metabolic acidosis -Acidosis possibly exacerabated hyperchloremia in the setting of IVF and renal failure Baseline creatinine 1.6-2.0 P: -Bicarb gtt, repeat BMP later today -Appreciate nephrology recommendations and renal US is pending     Type 2 DM -continue [place] and SSI    Schizoaffective Disorder, Autism, selective mutism -Continue prn Geodon and Ativan    Best practice:  Diet: NPO Pain/Anxiety/Delirium protocol (if indicated): Geodon VAP protocol (if indicated): n/a DVT prophylaxis: heparin GI prophylaxis: n/a Glucose control: SSI Mobility: bed rest Code Status: fever Family Communication:  Disposition:  ICU  Labs   CBC: Recent Labs  Lab 04/20/19 0707 04/24/2019 0349 04/14/2019 2234 04/22/19 0202 04/22/19 1814 04/23/19 0413 04/24/19 0543  WBC 11.0* 9.5 12.3* 12.1*  --  15.4* 12.7*  NEUTROABS 7.2 6.1 9.4*  --   --  11.8* 11.0*  HGB 8.9* 8.9* 7.0* 6.9* 8.1* 7.9* 7.5*  HCT 29.3* 28.9* 22.9* 22.3* 25.5* 25.5* 23.9*  MCV 83.7 84.3 84.2 82.9  --  83.1 82.4  PLT 326 316 284 262  --  297 [id number]    Basic Metabolic Panel: Recent Labs  Lab 04/19/19 0403 04/20/19 0707 04/04/2019 0349 04/08/2019 2234 04/22/19 0202 04/23/19 0413 04/24/19 0543  NA 138 140 136 138 138 137 138  K 4.1 3.7 4.2 3.7 3.7 4.0 4.0  CL 108 110 110 113* 113* 114* 114*  CO2 19* 18* 16* 16* 18* 11* 12*  GLUCOSE 364* 192* 497* 213* 248* 266* 164*  BUN 34* 32* 31* 30* 28* 34* 40*  CREATININE 2.32* 2.03* 2.15* 2.16* 2.11* 2.50* 2.94*  CALCIUM 7.9* 7.9* 7.7* 7.7* 7.4* 7.8* 7.8*  MG 1.8 1.7 1.6*  --  1.5* 1.7  --    GFR: Estimated Creatinine Clearance: 28.6 mL/min (A) (by C-G formula based on SCr of 2.94 mg/dL (H)). Recent Labs  Lab 04/07/2019 2234 04/22/19 0202 04/23/19 0413 04/24/19 0543  WBC 12.3* 12.1* 15.4* 12.7*  LATICACIDVEN 3.0* 0.9  --   --     Liver  Function Tests: Recent Labs  Lab 04/19/19 0403  AST 93*  ALT 41  ALKPHOS 108  BILITOT 0.4  PROT 6.1*  ALBUMIN 1.8*   No results for input(s): LIPASE, AMYLASE in the last 168 hours. No results for input(s): AMMONIA in the last 168 hours.  ABG    Component Value Date/Time   PHART 7.243 (L) 04/24/2019 1010   PCO2ART 32.6 04/24/2019 1010   PO2ART 40.5 (L) 04/24/2019 1010   HCO3 13.6 (L) 04/24/2019 1010   TCO2 19.6 02/17/2012 0530   ACIDBASEDEF 12.3 (H) 04/24/2019 1010   O2SAT 71.0 04/24/2019 1010     Coagulation Profile: No results for input(s): INR, PROTIME in the last 168 hours.  Cardiac Enzymes: No results for input(s): CKTOTAL, CKMB, CKMBINDEX, TROPONINI in the last 168 hours.  HbA1C: Hgb A1c MFr Bld  Date/Time Value Ref Range Status  04/11/2019 04:09 AM 6.7 (H) 4.8 - 5.6 % Final    Comment:    (NOTE) Pre diabetes:          5.7%-6.4% Diabetes:              >  6.4% Glycemic control for   <7.0% adults with diabetes   10/22/2011 09:12 AM 8.0 (H) <5.7 % Final    Comment:    (NOTE)                                                                       According to the ADA Clinical Practice Recommendations for 2011, when HbA1c is used as a screening test:  >=6.5%   Diagnostic of Diabetes Mellitus           (if abnormal result is confirmed) 5.7-6.4%   Increased risk of developing Diabetes Mellitus References:Diagnosis and Classification of Diabetes Mellitus,Diabetes [id number] 1):S62-S69 and Standards of Medical Care in         Diabetes - 2011,Diabetes [id number] (Suppl 1):S11-S61.    CBG: Recent Labs  Lab 04/23/19 1510 04/23/19 2113 04/23/19 2200 04/24/19 0520 04/24/19 0813  GLUCAP 179* 50* 107* 137* 166*    Review of Systems:   Review of Systems - unable to obtain secondary to mental status   Past Medical History  He,  has a past medical history of Autism, BPH (benign prostatic hyperplasia), Depression, Diabetes mellitus, Elective mutism,  Gastroparesis, Hyperglycemia, Hyperosmolar (nonketotic) coma ([place]), Hyponatremia, Mental retardation, Mental retardation, Pneumonia, Renal disorder, Urinary retention, and UTI (urinary tract infection).   Surgical History    Past Surgical History:  Procedure Laterality Date  . AMPUTATION Left 04/15/2019   Procedure: AMPUTATION ABOVE KNEE LEFT LEG;  Surgeon: Angelia Mould, MD;  Location: [facility] OR;  Service: Vascular;  Laterality: Left;     Social History   reports that he has never smoked. He has never used smokeless tobacco. He reports that he does not drink alcohol or use drugs.   Family History   His family history is not on file.   Allergies No Known Allergies   Home Medications  Prior to Admission medications   Medication Sig Start Date End Date Taking? Authorizing Provider  acetaminophen (TYLENOL) 325 MG tablet Take 650 mg by mouth every 4 (four) hours as needed for fever (pain).    Yes [provider]  amLODipine (NORVASC) 10 MG tablet Take 10 mg by mouth daily.   Yes [provider]  ertapenem (INVANZ) 1 g injection Inject 1 g into the muscle daily.   Yes [provider]  famotidine (PEPCID) 10 MG tablet Take 20 mg by mouth daily.    Yes [provider]  hydrALAZINE (APRESOLINE) 25 MG tablet Take 25 mg by mouth 3 (three) times daily.   Yes [provider]  HYDROcodone-acetaminophen (NORCO/VICODIN) 5-325 MG tablet Take 1 tablet by mouth every 6 (six) hours as needed (pain).    Yes [provider]  insulin detemir (LEVEMIR) 100 unit/ml SOLN Inject 10 Units into the skin daily.   Yes [provider]  insulin NPH-regular Human (70-30) 100 UNIT/ML injection Inject 15 Units into the skin daily with breakfast. Hold if blood sugar < 150   Yes [provider]  insulin regular (NOVOLIN R) 100 units/mL injection Inject 10 Units into the skin See admin instructions. Give 10 units as needed for blood sugars > 400    Yes [provider]  levETIRAcetam (KEPPRA) 250 MG tablet Take 250 mg by  mouth 2 (two) times daily.   Yes [provider]  mirtazapine (REMERON) 15 MG tablet Take 15 mg by mouth at bedtime.    Yes [provider]  Multiple Vitamins-Minerals (MULTIVITAMIN WITH MINERALS) tablet Take 1 tablet by mouth daily.   Yes [provider]  Nutritional Supplements (PROMOD) LIQD Take 30 mLs by mouth 2 (two) times daily.   Yes [provider]  pioglitazone (ACTOS) 45 MG tablet Take 45 mg by mouth daily.   Yes [provider]  sertraline (ZOLOFT) 100 MG tablet Take 200 mg by mouth daily.    Yes [provider]  vitamin C (ASCORBIC ACID) 500 MG tablet Take 500 mg by mouth 2 (two) times daily.   Yes [provider]  ziprasidone (GEODON) 20 MG capsule Take 20 mg by mouth daily.    Yes [provider]  ziprasidone (GEODON) 40 MG capsule Take 40 mg by mouth at bedtime.   Yes [provider]     Otilio Carpen Coleby Yett, PA-C [place] [facility]

## 2019-04-24 NOTE — Progress Notes (Signed)
[place] for Infectious Disease  Date of Admission:  04/26/2019     Total days of antibiotics 15         ASSESSMENT/PLAN  [name] has increased work of breathing and continued fevers which is likely the combination of aspiration pneumonia and fluid overload (+10 lbs since admission) as he has no other obvious source of infection at present. His surgical site continues to appear without evidence of infection. Respiratory therapy placed on BiPap and CCM has been consulted to manage care. Will continue current dose of cefepime and metronidazole and will change oral metronidazole to IV given decreased mental status. It is unlikely he has developed ESBL bacteria. Blood cultures are without growth to date and MRSA PCR negative.   1. Continue current dose of cefepime and metronidazole for aspiration pneumonia.  2. Diuresis for pulmonary edema and fluid overload per primary team / CCM.  3. Monitor fever curve and WBC count.  Principal Problem:   Postoperative fever Active Problems:   Intellectual disability   Acute kidney injury superimposed on CKD (HCC)   Chronic indwelling Foley catheter   Diabetes mellitus out of control (HCC)   Anemia, blood loss   Leukocytosis   History of seizure   Gangrene (HCC)   Pressure injury of skin   Folate deficiency   Gas gangrene of foot ([place])   . sodium chloride   Intravenous Once  . Chlorhexidine Gluconate Cloth  6 each Topical Q0600  . feeding supplement (GLUCERNA SHAKE)  237 mL Oral TID BM  . feeding supplement (PRO-STAT SUGAR FREE 64)  30 mL Oral TID WC  . folic acid  1 mg Oral Daily  . furosemide  40 mg Intravenous BID  . heparin  5,000 Units Subcutaneous Q8H  . insulin aspart  0-9 Units Subcutaneous TID WC  . insulin aspart  2 Units Subcutaneous TID WC  . insulin detemir  7 Units Subcutaneous Daily  . levalbuterol  0.63 mg Nebulization Q6H  . levETIRAcetam  250 mg Oral BID  . mirtazapine  15 mg Oral QHS  . multivitamin with  minerals  1 tablet Oral Daily  . nutrition supplement (JUVEN)  1 packet Oral BID BM  . pantoprazole  40 mg Oral Q0600  . sertraline  200 mg Oral Daily  . sodium chloride flush  3 mL Intravenous Q12H  . ziprasidone  20 mg Oral Daily  . ziprasidone  40 mg Oral QHS    SUBJECTIVE:  Febrile overnight with max temperature of 102.9. Nursing notes reviewed regarding fever and increasing work of breathing. He is lethargic opening his eyes to voice and quickly closes them.   No Known Allergies   Review of Systems: Review of Systems  Unable to perform ROS: Acuity of condition      OBJECTIVE: Vitals:   04/24/19 1020 04/24/19 1119 04/24/19 1120 04/24/19 1159  BP:      Pulse: 96 95    Resp: (!) 22 20 (!) 30   Temp: 99.7 F (37.6 C)   (!) 101.9 F (38.8 C)  TempSrc: Axillary   Axillary  SpO2: 100% 100% 100%   Weight:      Height:       Body mass index is 25.83 kg/m.  Physical Exam Constitutional:      General: He is not in acute distress.    Appearance: He is well-developed. He is ill-appearing.     Comments: Lying in bed with head of bed elevated; appears to have  increased work of breathing; lethargic and arousable to voice.   Cardiovascular:     Rate and Rhythm: Normal rate and regular rhythm.     Heart sounds: Normal heart sounds.  Pulmonary:     Effort: Pulmonary effort is normal.     Breath sounds: Rhonchi present.  Skin:    General: Skin is warm and dry.  Neurological:     Mental Status: He is alert and oriented to person, place, and time.     Lab Results Lab Results  Component Value Date   WBC 12.7 (H) 04/24/2019   HGB 7.5 (L) 04/24/2019   HCT 23.9 (L) 04/24/2019   MCV 82.4 04/24/2019   PLT 320 04/24/2019    Lab Results  Component Value Date   CREATININE 2.94 (H) 04/24/2019   BUN 40 (H) 04/24/2019   NA 138 04/24/2019   K 4.0 04/24/2019   CL 114 (H) 04/24/2019   CO2 12 (L) 04/24/2019    Lab Results  Component Value Date   ALT 41 04/19/2019   AST  93 (H) 04/19/2019   ALKPHOS 108 04/19/2019   BILITOT 0.4 04/19/2019     Microbiology: Recent Results (from the past 240 hour(s))  Culture, blood (routine x 2)     Status: None (Preliminary result)   Collection Time: 04/20/2019 10:18 PM   Specimen: BLOOD LEFT HAND  Result Value Ref Range Status   Specimen Description BLOOD LEFT HAND  Final   Special Requests   Final    BOTTLES DRAWN AEROBIC AND ANAEROBIC Blood Culture adequate volume   Culture   Final    NO GROWTH 3 DAYS Performed at [facility], [address] [place], [place] [address]    Report Status PENDING  Incomplete  Culture, blood (routine x 2)     Status: None (Preliminary result)   Collection Time: 04/11/2019 10:25 PM   Specimen: BLOOD RIGHT HAND  Result Value Ref Range Status   Specimen Description BLOOD RIGHT HAND  Final   Special Requests   Final    BOTTLES DRAWN AEROBIC AND ANAEROBIC Blood Culture adequate volume   Culture   Final    NO GROWTH 3 DAYS Performed at [facility], [place] [address] [place], [place] [address]    Report Status PENDING  Incomplete  Culture, Urine     Status: Abnormal   Collection Time: 04/22/19  8:46 AM   Specimen: Urine, Suprapubic  Result Value Ref Range Status   Specimen Description URINE, SUPRAPUBIC  Final   Special Requests   Final    NONE Performed at [facility], [place] [address], [place], [place] [address]    Culture 70,000 COLONIES/mL YEAST (A)  Final   Report Status 04/23/2019 FINAL  Final  MRSA PCR Screening     Status: None   Collection Time: 04/22/19  3:56 PM   Specimen: Nasal Mucosa; Nasopharyngeal  Result Value Ref Range Status   MRSA by PCR NEGATIVE NEGATIVE Final    Comment:        The GeneXpert MRSA Assay (FDA approved for NASAL specimens only), is one component of a comprehensive MRSA colonization surveillance program. It is not intended to diagnose MRSA infection nor to guide or monitor treatment for MRSA infections. Performed at [facility], [place] [address], [place], [place] [address]      Terri Piedra, [place] [facility] [phone] Pager  04/24/2019  1:11 PM

## 2019-04-24 NOTE — Progress Notes (Signed)
Progress:  Spoke to Pt's guardian Betsey Amen and updated her on Pt's status.  Also transitioned Ativan and Geodon to IV/IM while pt is NPO on Bipap

## 2019-04-24 NOTE — Consult Note (Addendum)
Cardiology Consultation:   Patient ID: [name] MRN: [id number]; DOB: [birth year]  Admit date: 04/05/2019 Date of Consult: 04/24/2019  Primary Care Provider: Rosita Fire, MD Primary Cardiologist: No primary care provider on file.  Primary Electrophysiologist:  None    Patient Profile:   [name] is a 58 y.o. male with a hx of severe autism with selective mutism, schizophrenic disorder, diabetes type 2, chronic kidney disease, chronic suprapubic catheter for urinary strictures who is being seen today for the evaluation of atrial fibrillation at the request of Dr. Horris Latino.  History of Present Illness:   [name] has a past medical history as above.  The patient was admitted on 04/13/2019 for nonhealing ulcer and gangrene of the left leg.  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  He developed a fever postoperatively and right lower lobe infiltrate treated for aspiration pneumonia with cefepime and Flagyl.  He was anemic, transfused 1 unit PRBCs on 8/19.  He developed respiratory distress and hypoxia today requiring BiPAP.  Creatinine has been rising from baseline 2.0 to 2.9, acidosis with bicarb of 12.  ABG confirms metabolic acidosis.  Chest x-ray showed Patchy bilateral airspace opacities, right greater than left most compatible with pneumonia. Stable bilateral effusions also greater on the right. BNP >1000. Echo done yesterday showed EF 60-65%, study was technically difficult due to limited windows. Unable to assess for wall motion abnormalities.   Heart rates were noted to be in the 100s.  EKG showed atrial fibrillation.    The patient is now being diuresed with Lasix 40 mg twice daily.   He continues to be on BiPap and Bicarb drip. He is non-verbal, opens eyes to voice, nods head to a few simple questions. Unclear if he is having pain. No apparent pain to palpation during exam. Telemetry shows sinus tachycardia in the low 100's with PACs. I am unable to see the telemetry  from last night, prior to his move to ICU. He has some trace right lower leg edema. Lung sounds are course. Unable to hear heart sounds clearly. Pt is shivering.    Heart Pathway Score:     Past Medical History:  Diagnosis Date   Autism    BPH (benign prostatic hyperplasia)    Depression    Diabetes mellitus    Elective mutism    Gastroparesis    Hyperglycemia    Hyperosmolar (nonketotic) coma ([place])    Hyponatremia    Mental retardation    Mental retardation    Pneumonia    Renal disorder    Urinary retention    UTI (urinary tract infection)     Past Surgical History:  Procedure Laterality Date   AMPUTATION Left 05/01/2019   Procedure: AMPUTATION ABOVE KNEE LEFT LEG;  Surgeon: Angelia Mould, MD;  Location: [facility] OR;  Service: Vascular;  Laterality: Left;     Home Medications:  Prior to Admission medications   Medication Sig Start Date End Date Taking? Authorizing Provider  acetaminophen (TYLENOL) 325 MG tablet Take 650 mg by mouth every 4 (four) hours as needed for fever (pain).    Yes [provider]  amLODipine (NORVASC) 10 MG tablet Take 10 mg by mouth daily.   Yes [provider]  ertapenem (INVANZ) 1 g injection Inject 1 g into the muscle daily.   Yes [provider]  famotidine (PEPCID) 10 MG tablet Take 20 mg by mouth daily.    Yes [provider]  hydrALAZINE (APRESOLINE) 25 MG tablet Take 25 mg  by mouth 3 (three) times daily.   Yes [provider]  HYDROcodone-acetaminophen (NORCO/VICODIN) 5-325 MG tablet Take 1 tablet by mouth every 6 (six) hours as needed (pain).    Yes [provider]  insulin detemir (LEVEMIR) 100 unit/ml SOLN Inject 10 Units into the skin daily.   Yes [provider]  insulin NPH-regular Human (70-30) 100 UNIT/ML injection Inject 15 Units into the skin daily with breakfast. Hold if blood sugar < 150   Yes [provider]  insulin regular (NOVOLIN R) 100  units/mL injection Inject 10 Units into the skin See admin instructions. Give 10 units as needed for blood sugars > 400   Yes [provider]  levETIRAcetam (KEPPRA) 250 MG tablet Take 250 mg by mouth 2 (two) times daily.   Yes [provider]  mirtazapine (REMERON) 15 MG tablet Take 15 mg by mouth at bedtime.    Yes [provider]  Multiple Vitamins-Minerals (MULTIVITAMIN WITH MINERALS) tablet Take 1 tablet by mouth daily.   Yes [provider]  Nutritional Supplements (PROMOD) LIQD Take 30 mLs by mouth 2 (two) times daily.   Yes [provider]  pioglitazone (ACTOS) 45 MG tablet Take 45 mg by mouth daily.   Yes [provider]  sertraline (ZOLOFT) 100 MG tablet Take 200 mg by mouth daily.    Yes [provider]  vitamin C (ASCORBIC ACID) 500 MG tablet Take 500 mg by mouth 2 (two) times daily.   Yes [provider]  ziprasidone (GEODON) 20 MG capsule Take 20 mg by mouth daily.    Yes [provider]  ziprasidone (GEODON) 40 MG capsule Take 40 mg by mouth at bedtime.   Yes [provider]    Inpatient Medications: Scheduled Meds:  sodium chloride   Intravenous Once   Chlorhexidine Gluconate Cloth  6 each Topical Q0600   feeding supplement (GLUCERNA SHAKE)  237 mL Oral TID BM   feeding supplement (PRO-STAT SUGAR FREE 64)  30 mL Oral TID WC   folic acid  1 mg Oral Daily   furosemide  40 mg Intravenous BID   heparin  5,000 Units Subcutaneous Q8H   insulin aspart  0-9 Units Subcutaneous TID WC   insulin aspart  2 Units Subcutaneous TID WC   insulin detemir  7 Units Subcutaneous Daily   levalbuterol  0.63 mg Nebulization Q6H   levETIRAcetam  250 mg Oral BID   mirtazapine  15 mg Oral QHS   multivitamin with minerals  1 tablet Oral Daily   nutrition supplement (JUVEN)  1 packet Oral BID BM   pantoprazole  40 mg Oral Q0600   sertraline  200 mg Oral Daily   sodium chloride flush  3 mL  Intravenous Q12H   ziprasidone  20 mg Oral Daily   ziprasidone  40 mg Oral QHS   Continuous Infusions:  [START ON 04/25/2019] ceFEPime (MAXIPIME) IV     magnesium sulfate bolus IVPB     metronidazole 500 mg (04/24/19 1310)    sodium bicarbonate (isotonic) infusion in sterile water 75 mL/hr at 04/24/19 1144   PRN Meds: acetaminophen **OR** acetaminophen, ipratropium-albuterol, LORazepam, magnesium sulfate bolus IVPB, metoprolol tartrate, morphine injection, ondansetron **OR** ondansetron (ZOFRAN) IV, oxyCODONE-acetaminophen  Allergies:   No Known Allergies  Social History:   Social History   Socioeconomic History   Marital status: Single    Spouse name: Not on file   Number of children: Not on file   Years of education:  Not on file   Highest education level: Not on file  Occupational History   Not on file  Social Needs   Financial resource strain: Not on file   Food insecurity    Worry: Not on file    Inability: Not on file   Transportation needs    Medical: Not on file    Non-medical: Not on file  Tobacco Use   Smoking status: Never Smoker   Smokeless tobacco: Never Used  Substance and Sexual Activity   Alcohol use: No   Drug use: No   Sexual activity: Not on file  Lifestyle   Physical activity    Days per week: Not on file    Minutes per session: Not on file   Stress: Not on file  Relationships   Social connections    Talks on phone: Not on file    Gets together: Not on file    Attends religious service: Not on file    Active member of club or organization: Not on file    Attends meetings of clubs or organizations: Not on file    Relationship status: Not on file   Intimate partner violence    Fear of current or ex partner: Not on file    Emotionally abused: Not on file    Physically abused: Not on file    Forced sexual activity: Not on file  Other Topics Concern   Not on file  Social History Narrative   Not on file    Family  History:   History reviewed. No pertinent family history.  Unable to obtain due to current critical illness.   ROS:  Please see the history of present illness.  Unable to assess full ROS due to critical illness on top of sever autism, on BiPap.   Physical Exam/Data:   Vitals:   04/24/19 1120 04/24/19 1159 04/24/19 1200 04/24/19 1300  BP:   (!) 109/58 (!) 123/33  Pulse:   94 98  Resp: (!) 30  (!) 26 (!) 26  Temp:  (!) 101.9 F (38.8 C)    TempSrc:  Axillary    SpO2: 100%  100% 96%  Weight:      Height:        Intake/Output Summary (Last 24 hours) at 04/24/2019 1343 Last data filed at 04/24/2019 1300 Gross per 24 hour  Intake 660.32 ml  Output 2325 ml  Net -1664.68 ml   Last 3 Weights 04/24/2019 04/23/2019 04/20/2019  Weight (lbs) 180 lb 189 lb 180 lb  Weight (kg) 81.647 kg 85.73 kg 81.647 kg     Body mass index is 25.83 kg/m.  General:  Well nourished, well developed, acutely ill on BiPap HEENT: normal Neck: no JVD Vascular: No carotid bruits;  Cardiac:  normal S1, S2; RRR; Difficult to hear heart sound over course lung sounds Lungs:  Course lung sounds Abd: soft, nontender, no hepatomegaly  Ext: trace RLE edema Musculoskeletal:  Left AKA Skin: warm and dry  Neuro:  Non-communicative Psych:  Withdrwn  EKG:  The EKG was personally reviewed and demonstrates:  ?atrial fibrillation Telemetry:  Telemetry was personally reviewed and demonstrates:  Sinus tachycardia in the low 100's with PACs  Relevant CV Studies:  Echocardiogram 04/23/2019  1. The left ventricle has normal systolic function, with an ejection fraction of 60-65%. The cavity size was normal.Technically difficult study due to limited windows. Cannot assess for focal wall motion abnormalities.  2. The right ventricle has normal systolc function. The cavity was normal.  There is no increase in right ventricular wall thickness. Right ventricular systolic pressure could not be assessed.  3. The aortic valve was not  well visualized.  4. The mitral valve is grossly normal.  5. The Pulmonary valve was not visualized.   Laboratory Data:  High Sensitivity Troponin:  No results for input(s): TROPONINIHS in the last 720 hours.   Cardiac EnzymesNo results for input(s): TROPONINI in the last 168 hours. No results for input(s): TROPIPOC in the last 168 hours.  Chemistry Recent Labs  Lab 04/22/19 0202 04/23/19 0413 05/20/2019 0543  NA 138 137 138  K 3.7 4.0 4.0  CL 113* 114* 114*  CO2 18* 11* 12*  GLUCOSE 248* 266* 164*  BUN 28* 34* 40*  CREATININE 2.11* 2.50* 2.94*  CALCIUM 7.4* 7.8* 7.8*  GFRNONAA 34* 27* 23*  GFRAA 39* 32* 26*  ANIONGAP 7 12 12     Recent Labs  Lab 04/19/19 0403  PROT 6.1*  ALBUMIN 1.8*  AST 93*  ALT 41  ALKPHOS 108  BILITOT 0.4   Hematology Recent Labs  Lab 04/22/19 0202 04/22/19 1814 04/23/19 0413 05-20-2019 0543  WBC 12.1*  --  15.4* 12.7*  RBC 2.69*  --  3.07* 2.90*  HGB 6.9* 8.1* 7.9* 7.5*  HCT 22.3* 25.5* 25.5* 23.9*  MCV 82.9  --  83.1 82.4  MCH 25.7*  --  25.7* 25.9*  MCHC 30.9  --  31.0 31.4  RDW 18.1*  --  17.7* 18.1*  PLT 262  --  297 320   BNP Recent Labs  Lab May 20, 2019 0741  BNP 1,044.4*    DDimer No results for input(s): DDIMER in the last 168 hours.   Radiology/Studies:  US Renal  Result Date: 05-20-2019 CLINICAL DATA:  Acute kidney injury. Peripheral vascular disease with gangrene. EXAM: RENAL / URINARY TRACT ULTRASOUND COMPLETE COMPARISON:  01/30/2012 renal ultrasound FINDINGS: Right Kidney: Renal measurements: 9.0 x 3.7 x 4.2 centimeters = volume: 92.9 mL . Echogenicity within normal limits. No mass or hydronephrosis visualized. Left Kidney: Renal measurements: 9.3 x 5.4 x 5.6 centimeters = volume: 148.7 mL. Echogenicity within normal limits. No mass or hydronephrosis visualized. Bladder: Bladder is decompressed by a Foley catheter. Additional: Study quality is degraded by patient motion artifact. IMPRESSION: 1. No acute abnormality. 2.  Kidneys measure slightly smaller compared to prior. Kidney now 9.0 centimeters and previously 10.3 and 11.2 centimeters. LEFT kidney now measures 9.3 centimeters, previously 10.8 and 10.5 centimeters. 3. No hydronephrosis. 4. Foley catheter. Electronically Signed   By: Nolon Nations M.D.   On: 20-May-2019 13:32   Dg Chest Port 1 View  Result Date: 2019-05-20 CLINICAL DATA:  Respiratory distress, pneumonia EXAM: PORTABLE CHEST 1 VIEW COMPARISON:  04/22/2019 FINDINGS: Patchy bilateral airspace opacities throughout both lungs, right greater than left have increased slightly since prior study. Small left pleural effusion and moderate right pleural effusion, similar prior study. Heart is borderline in size. IMPRESSION: Patchy bilateral airspace opacities, right greater than left most compatible with pneumonia. Stable bilateral effusions also greater on the right. Electronically Signed   By: Rolm Baptise M.D.   On: 05-20-2019 11:36   Dg Chest Port 1 View  Result Date: 04/23/2019 CLINICAL DATA:  Fever, wheezing. EXAM: PORTABLE CHEST 1 VIEW COMPARISON:  Radiograph April 21, 2019. FINDINGS: Stable cardiomediastinal silhouette. No pneumothorax is noted. Mildly increased bilateral perihilar opacities are noted concerning for possible edema or pneumonia. Increased mild loculated right pleural effusion is noted. Bony thorax is unremarkable. IMPRESSION: Mildly increased bilateral  perihilar opacities are noted concerning for possible edema or pneumonia. Increased mild loculated right pleural effusion is noted. Electronically Signed   By: Marijo Conception M.D.   On: 04/23/2019 09:48   Dg Chest Port 1 View  Result Date: 04/23/2019 CLINICAL DATA:  Fever. EXAM: PORTABLE CHEST 1 VIEW COMPARISON:  Chest radiograph 04/13/2019 FINDINGS: Development of large right perihilar opacity with volume loss in the right lung. Suspected right pleural effusion. Development of heterogeneous left infrahilar opacities. Heart is normal in  size. Unchanged mediastinal contours. No pneumothorax. No acute osseous abnormality. IMPRESSION: 1. Development of large right perihilar opacity, concerning for pneumonia. An element of atelectasis/collapse is also considered in the setting of volume loss. Right pleural effusion. 2. Heterogeneous left lung base opacities may be atelectasis or pneumonia. Electronically Signed   By: Keith Rake M.D.   On: 04/14/2019 22:14   Dg Swallowing Func-speech Pathology  Result Date: 04/23/2019 Objective Swallowing Evaluation: Type of Study: MBS-Modified Barium Swallow Study  Patient Details Name: Steve Krenz MRN: [id number] Date of Birth: 10-17-1960 Today's Date: 04/23/2019 Time: SLP Start Time (ACUTE ONLY): 19 -SLP Stop Time (ACUTE ONLY): 1400 SLP Time Calculation (min) (ACUTE ONLY): 20 min Past Medical History: Past Medical History: Diagnosis Date  Autism   BPH (benign prostatic hyperplasia)   Depression   Diabetes mellitus   Elective mutism   Gastroparesis   Hyperglycemia   Hyperosmolar (nonketotic) coma ([place])   Hyponatremia   Mental retardation   Mental retardation   Pneumonia   Renal disorder   Urinary retention   UTI (urinary tract infection)  Past Surgical History: Past Surgical History: Procedure Laterality Date  AMPUTATION Left 04/23/2019  Procedure: AMPUTATION ABOVE KNEE LEFT LEG;  Surgeon: Angelia Mould, MD;  Location: [place];  Service: Vascular;  Laterality: Left; HPI: 58 y.o.male, SNF resident,with medical history significant forsevere autism with elective mutism, schizoaffective disorder, diabetes mellitus type 2, chronic kidney disease, chronic suprapubic catheter for urinary strictures, admitted with left heel gangrene.  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 MBS from June 2013 with moderate dysphagia after acute hospitalization with multiple intubations.   Pt noted by nursing to be coughing frequently with meals.   Subjective: pleasant, cooperative Assessment / Plan / Recommendation CHL IP  CLINICAL IMPRESSIONS 04/23/2019 Clinical Impression Paient presents with a mild oral and a mild-moderate pharyngeal dysphagia. During oral phase, patient exhibited mastication delay with regular solids and delay in anterior to posterior transit of puree and regular solids boluses with piecemeal swallowing. Pharyngeal phase consisted of delays in swallow initiation to level of vallecular sinus with all tested boluses and flash penetration with thin liquids via cup or straw sip, which occured approximately 50% of the time, but with full clearance of penetrate.  SLP Visit Diagnosis Dysphagia, oropharyngeal phase (R13.12) Attention and concentration deficit following -- Frontal lobe and executive function deficit following -- Impact on safety and function Mild aspiration risk   CHL IP TREATMENT RECOMMENDATION 04/23/2019 Treatment Recommendations Therapy as outlined in treatment plan below   Prognosis 04/23/2019 Prognosis for Safe Diet Advancement Good Barriers to Reach Goals Cognitive deficits;Severity of deficits Barriers/Prognosis Comment -- CHL IP DIET RECOMMENDATION 04/23/2019 SLP Diet Recommendations Regular solids;Thin liquid Liquid Administration via Cup;Straw Medication Administration Whole meds with liquid Compensations Minimize environmental distractions;Slow rate;Small sips/bites Postural Changes --   CHL IP OTHER RECOMMENDATIONS 02/12/2012 Recommended Consults -- Oral Care Recommendations -- Other Recommendations Order thickener from pharmacy;Clarify dietary restrictions   CHL IP FOLLOW UP RECOMMENDATIONS 04/23/2019 Follow up Recommendations Other (comment)  CHL IP FREQUENCY AND DURATION 04/23/2019 Speech Therapy Frequency (ACUTE ONLY) min 2x/week Treatment Duration 1 week      CHL IP ORAL PHASE 04/23/2019 Oral Phase Impaired Oral - Pudding Teaspoon -- Oral - Pudding Cup -- Oral - Honey Teaspoon -- Oral - Honey Cup -- Oral - Nectar Teaspoon -- Oral - Nectar Cup -- Oral - Nectar Straw -- Oral - Thin Teaspoon NT Oral  - Thin Cup WFL Oral - Thin Straw WFL Oral - Puree Reduced posterior propulsion;Delayed oral transit;Piecemeal swallowing Oral - Mech Soft -- Oral - Regular Piecemeal swallowing;Delayed oral transit;Impaired mastication Oral - Multi-Consistency -- Oral - Pill -- Oral Phase - Comment --  CHL IP PHARYNGEAL PHASE 04/23/2019 Pharyngeal Phase Impaired Pharyngeal- Pudding Teaspoon -- Pharyngeal -- Pharyngeal- Pudding Cup -- Pharyngeal -- Pharyngeal- Honey Teaspoon -- Pharyngeal -- Pharyngeal- Honey Cup -- Pharyngeal -- Pharyngeal- Nectar Teaspoon -- Pharyngeal -- Pharyngeal- Nectar Cup -- Pharyngeal -- Pharyngeal- Nectar Straw -- Pharyngeal -- Pharyngeal- Thin Teaspoon -- Pharyngeal -- Pharyngeal- Thin Cup Delayed swallow initiation-vallecula;Penetration/Aspiration during swallow Pharyngeal Material enters airway, remains ABOVE vocal cords then ejected out Pharyngeal- Thin Straw Penetration/Aspiration during swallow;Delayed swallow initiation-vallecula Pharyngeal Material enters airway, remains ABOVE vocal cords then ejected out Pharyngeal- Puree Delayed swallow initiation-vallecula Pharyngeal -- Pharyngeal- Mechanical Soft -- Pharyngeal -- Pharyngeal- Regular Delayed swallow initiation-vallecula Pharyngeal -- Pharyngeal- Multi-consistency -- Pharyngeal -- Pharyngeal- Pill -- Pharyngeal -- Pharyngeal Comment --  CHL IP CERVICAL ESOPHAGEAL PHASE 04/23/2019 Cervical Esophageal Phase WFL Pudding Teaspoon -- Pudding Cup -- Honey Teaspoon -- Honey Cup -- Nectar Teaspoon -- Nectar Cup -- Nectar Straw -- Thin Teaspoon -- Thin Cup -- Thin Straw -- Puree -- Mechanical Soft -- Regular -- Multi-consistency -- Pill -- Cervical Esophageal Comment -- Dannial Monarch 04/23/2019, 4:32 PM  Sonia Baller, MA, CCC-SLP Speech Therapy [facility] Acute Rehab Pager: [phone]             Vas [place] Abi With/wo Tbi  Result Date: 04/23/2019 LOWER EXTREMITY DOPPLER STUDY Indications: Peripheral artery disease.  Vascular Interventions: Left AKA.  Comparison Study: No previous study available for comparison Performing Technologist: Birdena Crandall, [place]  Examination Guidelines: A complete evaluation includes at minimum, Doppler waveform signals and systolic blood pressure reading at the level of bilateral brachial, anterior tibial, and posterior tibial arteries, when vessel segments are accessible. Bilateral testing is considered an integral part of a complete examination. Photoelectric Plethysmograph (PPG) waveforms and toe systolic pressure readings are included as required and additional duplex testing as needed. Limited examinations for reoccurring indications may be performed as noted.  ABI Findings: +---------+------------------+-----+---------+----------------+  Right     Rt Pressure (mmHg) Index Waveform  Comment           +---------+------------------+-----+---------+----------------+  Brachial  155                      triphasic                   +---------+------------------+-----+---------+----------------+  PTA       255                1.65  biphasic  non compressible  +---------+------------------+-----+---------+----------------+  DP        255                1.65  triphasic non compressible  +---------+------------------+-----+---------+----------------+  Great Toe 51                 0.33                              +---------+------------------+-----+---------+----------------+ +---------+------------------+-----+---------+-------+  Left      Lt Pressure (mmHg) Index Waveform  Comment  +---------+------------------+-----+---------+-------+  Brachial  115                      triphasic          +---------+------------------+-----+---------+-------+  PTA                                          AKA      +---------+------------------+-----+---------+-------+  DP                                           AKA      +---------+------------------+-----+---------+-------+  Great Toe                                    AKA       +---------+------------------+-----+---------+-------+ +-------+-----------+-----------+------------+------------+  ABI/TBI Today's ABI Today's TBI Previous ABI Previous TBI  +-------+-----------+-----------+------------+------------+  Right   1.65        0.33        no previous  no previous   +-------+-----------+-----------+------------+------------+  Left    AKA                     no previous  no previous   +-------+-----------+-----------+------------+------------+ Arterial wall calcification precludes accurate ankle pressures and ABIs.  Summary: Right: Resting right ankle-brachial index indicates noncompressible right lower extremity arteries.The right toe-brachial index is abnormal. Left: Left Above the knee amputation.  *See table(s) above for measurements and observations.  Electronically signed by Servando Snare MD on 04/23/2019 at 1:25:35 PM.    Final    Vas [place] Lower Extremity Arterial Duplex  Result Date: 04/23/2019 LOWER EXTREMITY ARTERIAL DUPLEX STUDY Indications: Peripheral artery disease.  Current ABI: Non compressible Limitations: Positionimg of the patient and respiratory interference.              Overpowering venous signal Comparison       No right lower extremity arterial duplex available for Study:           comparison Performing Technologist: Toma Copier RVS  Examination Guidelines: A complete evaluation includes B-mode imaging, spectral Doppler, color Doppler, and power Doppler as needed of all accessible portions of each vessel. Bilateral testing is considered an integral part of a complete examination. Limited examinations for reoccurring indications may be performed as noted.  +----------+--------+-----+--------+---------+---------------------------------+  RIGHT      PSV cm/s Ratio Stenosis Waveform  Comments                           +----------+--------+-----+--------+---------+---------------------------------+  CFA Prox   93                      triphasic mild plaque                         +----------+--------+-----+--------+---------+---------------------------------+  DFA        107                     triphasic mild plaque                        +----------+--------+-----+--------+---------+---------------------------------+  SFA Prox   120                     triphasic mild plaque                        +----------+--------+-----+--------+---------+---------------------------------+  SFA Mid    92                      triphasic mild plaque                        +----------+--------+-----+--------+---------+---------------------------------+  SFA Distal 100                     triphasic mild plaque                        +----------+--------+-----+--------+---------+---------------------------------+  POP Prox   78                      triphasic mild plaque                        +----------+--------+-----+--------+---------+---------------------------------+  POP Distal 79                      biphasic  mild plaque                        +----------+--------+-----+--------+---------+---------------------------------+  ATA Distal 37                                                                   +----------+--------+-----+--------+---------+---------------------------------+  PTA Prox   50                                Difficult to evaluate waveforms                                                  due to strong venous signal                                                      moderate plaque                    +----------+--------+-----+--------+---------+---------------------------------+  PTA Mid    29                                mild plaque                        +----------+--------+-----+--------+---------+---------------------------------+  PTA Distal 66                                                                   +----------+--------+-----+--------+---------+---------------------------------+  Summary: Right: ABIs are unreliable. There is no obvious evidence of  significant stenosis. However there is calcification of the arteries worsening moving distally from the thigh to the ankle.  See table(s) above for measurements and observations.    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 Had respiratory distress overnight requiring BiPap.  -He developed ?afib over night. Upon review of EKG and a telemetry, does not appear to be afib. EKG read as afib, although bumpy tracing, there are p waves present. Irregular due to PACs.  -Currently telemetry clearly shows sinus tachycardia in the low 100's with PACs since arrival to ICU. -Continue to monitor  Acute pulmonary edema/acute respiratory failure -On BiPap, started last night -Pt with aspiration pneumonia, on IV antibiotics per primary team. Blood cultures pending. -BNP 1044. -Echo with normal EF (poor study-unable to assess wall motion).  -Being diuresed with lasix 40 mg IV BID  Acute on chronic kidney disease and metabolic acidosis -Baseline creatinine 1.6-2.0 -Creatinine up to 2.94 today -On Bicarb drip -Monitor closely with diuresis -Nephrology consulting  Anemia -Probably of chronic disease. Had recent surgery. Hgb was 6.9, was given 1 U PRBCs, Hgb 7.5 today.       For questions or updates, please contact [place] Please consult [email or web address] for contact info under     Signed, Daune Perch, NP  04/24/2019 1:43 PM As above, patient seen and examined.  Briefly he is a 58 year old male with past medical history of autism, selective mutism, schizophrenic disorder, diabetes, chronic kidney disease, chronic suprapubic catheter for evaluation of question atrial fibrillation.  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  Postoperatively he has been treated for possible aspiration pneumonia.  Patient developed worsening respiratory distress and was transferred to the  unit and is now on BiPAP.  There was a question of atrial fibrillation and cardiology asked to evaluate.  Patient is on BiPAP and does not respond to questions. Chest x-ray shows bilateral airspace opacities right greater than left suggestive of pneumonia and edema.  Creatinine is 2.94.  BNP 1044.  Hemoglobin 9.2.  Electrocardiogram showed sinus rhythm with PACs and nonspecific ST changes.  Echocardiogram shows normal LV function.  1 question atrial fibrillation-I have personally reviewed patient's telemetry and electrocardiograms.  Computer interpretation of ECG is atrial fibrillation but on my review there is sinus with PACs.  No atrial fibrillation on telemetry.  2 acute diastolic congestive heart failure-patient has respiratory failure likely due to a combination of pneumonia and CHF.  Continue diuresis and follow renal function.  3 pneumonia-postoperative.  Continue antibiotics.  Critical care medicine following.  4 acute on chronic stage IV kidney disease-follow renal function closely with diuresis.  5 status post AKA-Per surgery.  Kirk Ruths, MD

## 2019-04-24 NOTE — Progress Notes (Addendum)
RN, Maudie Mercury with respiratory, MD Elsworth Soho, MD Horris Latino, at [place] on phone. Plan to transport to IUC.   Addendum:  Agricultural consultant, Rapid, and Respiratory transported patient to ICU after this RN gave report to receiving RN.   Saddie Benders RN BSN

## 2019-04-24 NOTE — Progress Notes (Signed)
PT Cancellation Note  Patient Details Name: [name] MRN: [id number] DOB: 02-04-1961   Cancelled Treatment:    Reason Eval/Treat Not Completed: Medical issues which prohibited therapy Pt had rapid response called this Am secondary to increased work of breathing; placed on BiPAP and currently being transferred to ICU. Will hold PT today. Will follow up as appropriate.    Marguarite Arbour A Burnie Therien 04/24/2019, 11:37 AM Wray Kearns, PT, DPT Acute Rehabilitation Services Pager [phone] Office [phone]

## 2019-04-24 NOTE — Progress Notes (Signed)
Notified MD [place] of ABG completed and Bipap placed. Orders for CT chest, Covid (discontinued), and Critical Care called.   Saddie Benders RN BSN

## 2019-04-24 NOTE — Progress Notes (Signed)
RT note-treatment given at this time scheduled. Spoke with RN concerning patients current status, no other intervention at this time.

## 2019-04-24 NOTE — Progress Notes (Signed)
Patient's temp of 102.9 was recorded.  Patient was given Tylenol, placed ice pack under armpits and cold compress on forehead.  I will keep monitoring patient.

## 2019-04-24 NOTE — Progress Notes (Signed)
RT note-Patient transported to ICU with rapid response, remains on current Bipap settings, ABG due at 1500.

## 2019-04-24 NOTE — Consult Note (Addendum)
[facility]    NEPHROLOGY CONSULTATION NOTE  PATIENT ID:  [name], DOB:  [birth year]  HPI: The patient is a 58 y.o. 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 Subsequently underwent left AKA due to gangrene of left heel on 8/18. Prior to this admission he was admitted to [facility] for debridement and had acute drop in hemoglobin requiring 2U transfusion.  He developed fever to 103 POD#1 with initial unclear source of infection, treated with broad spectrum abx. ID was consulted and patient diagnosed with pneumonia, currently receiving metro and cefepime. This am he required BiPap after developing hypoxemia on RA with sats of 83%. He was transferred to ICU with consult. Additionally has developed worsening AKI on CKD, for which nephrology was consulted.    Past Medical History:  Diagnosis Date  . Autism   . BPH (benign prostatic hyperplasia)   . Depression   . Diabetes mellitus   . Elective mutism   . Gastroparesis   . Hyperglycemia   . Hyperosmolar (nonketotic) coma ([place])   . Hyponatremia   . Mental retardation   . Mental retardation   . Pneumonia   . Renal disorder   . Urinary retention   . UTI (urinary tract infection)     Past Surgical History:  Procedure Laterality Date  . AMPUTATION Left 04/08/2019   Procedure: AMPUTATION ABOVE KNEE LEFT LEG;  Surgeon: Angelia Mould, MD;  Location: [facility];  Service: Vascular;  Laterality: Left;    History reviewed. No pertinent family history.  Social History   Tobacco Use  . Smoking status: Never Smoker  . Smokeless tobacco: Never Used  Substance Use Topics  . Alcohol use: No  . Drug use: No    REVIEW OF SYSTEMS: Unable to perform ROS as patient is  non-verbal.  PHYSICAL EXAM:  Vitals:   04/24/19 1119 04/24/19 1120  BP:    Pulse: 95   Resp: 20 (!) 30  Temp:    SpO2: 100% 100%   I/O last 3 completed shifts: In: [id number] [P.O.:944; I.V.:496; IV Piggyback:400] Out: 2600 [Urine:2600]   General:  Non-verbal, appears stated age  75: MMM [place] AT anicteric sclera Neck:  No JVD, no adenopathy CV:  Tachycardic, distant heart sounds, trace LE edema Lungs:  On bipap, bilateral rhonchi and crackles, increased work of breathing Abd:  abd SNT/ND with normal BS GU:  Suprapubic catheter in place, urine yellow Skin:  No skin rash, AKA wound with staples, no erythema or discharge Extremities: LLE AKA   CURRENT MEDICATIONS:  . sodium chloride   Intravenous Once  . Chlorhexidine Gluconate Cloth  6 each Topical Q0600  . feeding supplement (GLUCERNA SHAKE)  237 mL Oral TID BM  . feeding supplement (PRO-STAT SUGAR FREE 64)  30 mL Oral TID WC  . folic acid  1 mg Oral Daily  . furosemide  40 mg Intravenous BID  . heparin  5,000 Units Subcutaneous Q8H  . insulin aspart  0-9 Units Subcutaneous TID WC  . insulin aspart  2 Units Subcutaneous TID WC  . insulin detemir  7 Units Subcutaneous Daily  . levalbuterol  0.63 mg Nebulization Q6H  . levETIRAcetam  250 mg Oral BID  . mirtazapine  15 mg Oral QHS  . multivitamin with minerals  1 tablet Oral Daily  . nutrition supplement (JUVEN)  1 packet Oral BID BM  . pantoprazole  40 mg Oral Q0600  . sertraline  200 mg Oral Daily  . sodium chloride flush  3 mL Intravenous Q12H  . ziprasidone  20 mg Oral Daily  . ziprasidone  40 mg Oral QHS     HOME MEDICATIONS:  Prior to Admission medications   Medication Sig Start Date End Date Taking? Authorizing Provider  acetaminophen (TYLENOL) 325 MG tablet Take 650 mg by mouth every 4 (four) hours as needed for fever (pain).    Yes [provider]  amLODipine (NORVASC) 10 MG tablet Take 10 mg by mouth daily.   Yes [provider]  ertapenem  (INVANZ) 1 g injection Inject 1 g into the muscle daily.   Yes [provider]  famotidine (PEPCID) 10 MG tablet Take 20 mg by mouth daily.    Yes [provider]  hydrALAZINE (APRESOLINE) 25 MG tablet Take 25 mg by mouth 3 (three) times daily.   Yes [provider]  HYDROcodone-acetaminophen (NORCO/VICODIN) 5-325 MG tablet Take 1 tablet by mouth every 6 (six) hours as needed (pain).    Yes [provider]  insulin detemir (LEVEMIR) 100 unit/ml SOLN Inject 10 Units into the skin daily.   Yes [provider]  insulin NPH-regular Human (70-30) 100 UNIT/ML injection Inject 15 Units into the skin daily with breakfast. Hold if blood sugar < 150   Yes [provider]  insulin regular (NOVOLIN R) 100 units/mL injection Inject 10 Units into the skin See admin instructions. Give 10 units as needed for blood sugars > 400   Yes [provider]  levETIRAcetam (KEPPRA) 250 MG tablet Take 250 mg by mouth 2 (two) times daily.   Yes [provider]  mirtazapine (REMERON) 15 MG tablet Take 15 mg by mouth at bedtime.    Yes [provider]  Multiple Vitamins-Minerals (MULTIVITAMIN WITH MINERALS) tablet Take 1 tablet by mouth daily.   Yes [provider]  Nutritional Supplements (PROMOD) LIQD Take 30 mLs by mouth 2 (two) times daily.   Yes [provider]  pioglitazone (ACTOS) 45 MG tablet Take 45 mg by mouth daily.   Yes [provider]  sertraline (ZOLOFT) 100 MG tablet Take 200 mg by mouth daily.    Yes [provider]  vitamin C (ASCORBIC ACID) 500 MG tablet Take 500 mg by mouth 2 (two) times daily.   Yes [provider]  ziprasidone (GEODON) 20 MG capsule Take 20 mg by mouth daily.    Yes [provider]  ziprasidone (GEODON) 40 MG capsule Take 40 mg by mouth at bedtime.   Yes [provider]       LABS:  CBC Latest Ref Rng & Units 04/24/2019 04/23/2019 04/22/2019  WBC  4.0 - 10.5 K/uL 12.7(H) 15.4(H) -  Hemoglobin 13.0 - 17.0 g/dL 7.5(L) 7.9(L) 8.1(L)  Hematocrit 39.0 - 52.0 % 23.9(L) 25.5(L) 25.5(L)  Platelets 150 - 400 K/uL 320 297 -    CMP Latest Ref Rng & Units 04/24/2019 04/23/2019 04/22/2019  Glucose 70 - 99 mg/dL 164(H) 266(H) 248(H)  BUN 6 - 20 mg/dL 40(H) 34(H) 28(H)  Creatinine 0.61 - 1.24 mg/dL 2.94(H) 2.50(H) 2.11(H)  Sodium 135 - 145 mmol/L 138 137 138  Potassium 3.5 - 5.1 mmol/L 4.0 4.0 3.7  Chloride 98 - 111 mmol/L 114(H) 114(H) 113(H)  CO2 22 - 32 mmol/L 12(L) 11(L) 18(L)  Calcium  8.9 - 10.3 mg/dL 7.8(L) 7.8(L) 7.4(L)  Total Protein 6.5 - 8.1 g/dL - - -  Total Bilirubin 0.3 - 1.2 mg/dL - - -  Alkaline Phos 38 - 126 U/L - - -  AST 15 - 41 U/L - - -  ALT 0 - 44 U/L - - -    Lab Results  Component Value Date   CALCIUM 7.8 (L) 04/24/2019   CAION 1.25 12/04/2011   PHOS 4.1 02/16/2012       Component Value Date/Time   COLORURINE YELLOW 04/22/2019 0938   APPEARANCEUR HAZY (A) 04/22/2019 0938   LABSPEC 1.013 04/22/2019 0938   PHURINE 6.0 04/22/2019 0938   GLUCOSEU 50 (A) 04/22/2019 0938   HGBUR SMALL (A) 04/22/2019 0938   BILIRUBINUR NEGATIVE 04/22/2019 0938   KETONESUR 5 (A) 04/22/2019 0938   PROTEINUR 100 (A) 04/22/2019 0938   UROBILINOGEN 0.2 01/21/2012 1250   NITRITE NEGATIVE 04/22/2019 0938   LEUKOCYTESUR LARGE (A) 04/22/2019 0938      Component Value Date/Time   PHART 7.243 (L) 04/24/2019 1010   PCO2ART 32.6 04/24/2019 1010   PO2ART 40.5 (L) 04/24/2019 1010   HCO3 13.6 (L) 04/24/2019 1010   TCO2 19.6 02/17/2012 0530   ACIDBASEDEF 12.3 (H) 04/24/2019 1010   O2SAT 71.0 04/24/2019 1010       Component Value Date/Time   IRON 13 (L) 04/18/2019 1756   TIBC 162 (L) 04/15/2019 1756   FERRITIN 1,671 (H) 04/25/2019 1756   IRONPCTSAT 8 (L) 04/16/2019 1756       ASSESSMENT/PLAN:     Problem List Items Addressed This Visit      Respiratory   Pneumonia   Relevant Medications   ertapenem (INVANZ) 1 g injection    levalbuterol (XOPENEX) nebulizer solution 0.63 mg   ipratropium-albuterol (DUONEB) 0.5-2.5 (3) MG/3ML nebulizer solution 3 mL   ceFEPIme (MAXIPIME) 2 g in sodium chloride 0.9 % 100 mL IVPB (Start on 04/25/2019  1:35 AM)   metroNIDAZOLE (FLAGYL) IVPB 500 mg (Start on 04/24/2019  2:00 PM)   Other Relevant Orders   DG CHEST PORT 1 VIEW (Completed)   Respiratory failure (HCC)     Other   Anemia, blood loss   Relevant Medications   folic acid (FOLVITE) tablet 1 mg   Leukocytosis   Relevant Orders   DG CHEST PORT 1 VIEW (Completed)    Other Visit Diagnoses    Emesis       Relevant Orders   DG Abd 2 Views (Completed)   DG Abd 1 View (Completed)   Fever       Relevant Orders   DG Chest Port 1 View (Completed)   DG CHEST PORT 1 VIEW (Completed)   AKI (acute kidney injury) ([place])       Relevant Orders   US RENAL   Acute respiratory failure ([place])         58 y.o. year old male with PMH autism with elective mutism, chronic anemia, TIIDM, CKD likely Stage III, chronic suprapubic catheter 2/2 urinary stricture with planned CT angiogram, admitted 2/2 hemoglobin of 7.8. Subsequently underwent left AKA due to gangrene of left heel. Nephrology consulted for AKI on CKD.    AKI on CKD Stage III   Baseline Cr. Likely ~2.6 with GFR 30 although only single lab value available from SNF on 6/23, similar labs in 2013. Creatinine currently 2.9. BNP elevated with echo yesterday showing normal systolic function. UA 8/19 positive for rare bacteria, leuks. Proteinuria, RBCs also present, urine culture  negative. He received vanc 8/8-8/13, again on 8/18. He is net +6.8L, appears to be up 20lbs with edema noted on x-ray, now receiving IV lasix 40 yesterday with bid planned today. Renal US without hydronephrosis but some kidney atrophy compared to prior consistent with CKD. He is likely near baseline with current AKI secondary to sepsis and nephrotoxic drugs.    - Ur protein/creatinine - repeat BMP - stop bicarb  gtt, f/u repeat ABG - continue IV lasix  Metabolic Acidosis Setting of AKI  - d/c bicarb, 1 amp bicarb now - repeat ABG pending - repeat BMP   Acute hypoxic respiratory failure  Currently on bipap. Appreciate critical care consult.   Aspiration Pneumonia UTI Sepsis ID consulted. Currently being treated with cefepime and metronidazole.   Anemia of Chronic Disease and IDA  Discussed with Dr. Florentina Addison office, thought to be 2/2 anemia of chronic disease and iron deficiency. He has not yet been seen there but was consulted while patient was in the nursing home and received at least one transfusion of iron with hgb 7.5 in June and 6.9 in early July with plans to start Epogen. S/p 3U in hospital transfusion for acutely low hemoglobin. GI consulted 8/07 and not thought to have acute bleed.   LLE Gangrene s/p AKA Wound without signs of infection.    Molli Hazard A, DO 04/24/2019, 11:44 AM Pager: [phone]

## 2019-04-24 NOTE — Significant Event (Signed)
Rapid Response Event Note  Overview: Respiratory - Transfer to ICU  Initial Focused Assessment: Received a call by RT about ABG 7.24/33/40.01/13/70-oxygen saturation. Per RT patient was placed on BIPAP. Nursing staff informed me that patient is being transferred to the ICU. I was with another patient, staff notified that I would come help as soon as I could. Upon arrival, PCCM had seen patient, he was awake, follows some commands, breathing comfortably on BIPAP for now. VSS.   Interventions: -- None--  Plan of Care: -- Transferred to 3M07 on BIPAP  Event Summary:  Call Time 1035  End Time Elsie, Ashley

## 2019-04-24 NOTE — Progress Notes (Signed)
   04/24/19 0951  Vitals  Pulse Rate 98  ECG Heart Rate 100  Resp (!) 28  Oxygen Therapy  SpO2 94 %  O2 Device Nasal Cannula  O2 Flow Rate (L/min) 3 L/min  MEWS Score  MEWS RR 2  MEWS Pulse 0  MEWS Systolic 0  MEWS LOC 0  MEWS Temp 0  MEWS Score 2  MEWS Score Color Yellow   Notified RT Kim about stat ABG that was ordered. MD and RN concerned with work of breathing and possible need for Bipap need based on ABG. Spoke to MD with these plans via phone and will notify with changes in status or ABG results.   Saddie Benders RN BSN

## 2019-04-24 NOTE — Progress Notes (Signed)
Patient ID: [name], male   DOB: 08/28/1961, 58 y.o.   MRN: [id number]  PROGRESS NOTE    Jamiere Boring  [id number] DOB: 10/09/1960 DOA: 04/19/2019 PCP: Rosita Fire, MD   Brief Narrative:  58 y.o.malewith medical history significant ofsevere autism with elective mutism, schizoaffective disorder, diabetes mellitus type 2, chronic kidney disease, chronic suprapubic catheter for urinary strictures, who presented for CT angiogram of the left leg with Dr. Scot Dock for nonhealing ulcer. However, patient was found to have hemoglobin 7.8 for which the procedure was canceled.  He underwent surgical debridement on February 26, 2019 and was noted to have dropping hemoglobin.  Patient was recently found to have dropping hemoglobin for which Dr. Befekadu/nephrology had started iron infusions.  On admission, vascular surgery was consulted and patient was started on intravenous antibiotics.  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  UC grew yeast, ID on board, no further recs ID consulted, appreciate recs, fever likely due to postop pneumonia, continue cefepime, metronidazole.  Discontinue vancomycin Continue cefepime, metronidazole SLP on board, recommend regular diet/thin liquids  Acute hypoxic respiratory failure likely 2/2 HCAP Currently placed on bipap noted hypoxia, increased work of breathing ABG showed hypoxia, metabolic acidosis Chest x-ray with bilateral opacity concerning for edema versus pneumonia Continue IV  lasix Duo nebs, incentive spirometry, supplemental oxygen prn PCCM consulted Transfer to ICU for closer observation  Acute kidney injury superimposed on chronic kidney disease stage III Now with metabolic acidosis History of chronic suprapubic catheter Unclear baseline, last creatinine in 2013, ??new baseline Nephrology consulted Starting bicarb drip in the ICU Daily BMP, monitor closely   ??New onset A. fib/volume overload Heart rate noted to be in the 100s BNP >1000 EKG showed A. Fib Chest x-ray as above Echo done was technically difficult due to poor windows/positioning, noted to have EF of 60 to 65% Started on IV Lasix, with close monitoring of renal function Cardiology consulted  Iron deficiency anemia/anemia of chronic disease/folate deficiency Acute drop in hemoglobin No overt signs of GI bleeding.  FOBT negative.  GI recommended outpatient GI evaluation and follow-up Status post 3 units packed red cells transfusion since admission, last on 04/22/2019  Status post IV Feraheme on 04/11/2019  Folate levels low at 3.8.  Continue supplementation. Vitamin B12 level at 1214 Continue PPI  Hypomagnesemia Replace PRN Daily magnesium  Hypertension BP currently soft Hold amlodipine and hydralazine.  Diabetes mellitus type 2  A1c 6.7.  Continue Levemir, NovoLog Continue CBGs with SSI, hypoglycemic protocol  Hypoalbuminemia Follow nutrition recommendations.  Schizoaffective disorder  Stable Continue Zoloft, Geodon, Remeron  Autism/intellectual disability Patient autistic at baseline and electively mute.  Patient nods his head or shakes his head to answer yes or no.  History of seizure disorder No seizures noted Continue Keppra.   DVT prophylaxis: Heparin Code Status: Full Family Communication: None at bedside Disposition Plan: Back to SNF  Consultants:  Vascular surgery GI ID PCCM Nephrology Cardiology  Procedures: Left AKA  Antimicrobials:  Currently  on cefepime, metronidazole Vancomycin and Zosyn from 05/01/2019-04/23/2019 Rocephin from 04/17/2019 onwards Flagyl from 04/18/2019 onwards  Subjective: Patient seen and examined at bedside, noted to have worsening increased work of breathing, lethargic.  Rapid response called overnight.  PCCM consulted this a.m., patient transferred to ICU     Objective: Vitals:   04/24/19 1120 04/24/19 1159 04/24/19 1200 04/24/19 1300  BP:   (!) 109/58 (!) 123/33  Pulse:   94 98  Resp: (!) 30  (!) 26 (!) 26  Temp:  (!) 101.9 F (38.8 C)    TempSrc:  Axillary    SpO2: 100%  100% 96%  Weight:      Height:        Intake/Output Summary (Last 24 hours) at 04/24/2019 1313 Last data filed at 04/24/2019 1300 Gross per 24 hour  Intake 760.32 ml  Output 2525 ml  Net -1764.68 ml   Filed Weights   04/20/19 0627 04/23/19 0456 04/24/19 0433  Weight: 81.6 kg 85.7 kg 81.6 kg    Examination:  General: NAD, mute  Cardiovascular: S1, S2 present  Respiratory:  Bilateral rhonchi noted, sounds congested  Abdomen: Soft, nontender, nondistended, bowel sounds present  Musculoskeletal: Left lower extremity wrapped in Ace, clean/dry/intact, no bilateral pedal edema noted  Skin: Normal  Psychiatry:  Unable to assess     Data Reviewed: I have personally reviewed following labs and imaging studies  CBC: Recent Labs  Lab 04/20/19 0707 04/27/2019 0349 04/18/2019 2234 04/22/19 0202 04/22/19 1814 04/23/19 0413 04/24/19 0543  WBC 11.0* 9.5 12.3* 12.1*  --  15.4* 12.7*  NEUTROABS 7.2 6.1 9.4*  --   --  11.8* 11.0*  HGB 8.9* 8.9* 7.0* 6.9* 8.1* 7.9* 7.5*  HCT 29.3* 28.9* 22.9* 22.3* 25.5* 25.5* 23.9*  MCV 83.7 84.3 84.2 82.9  --  83.1 82.4  PLT 326 316 284 262  --  297 [id number]   Basic Metabolic Panel: Recent Labs  Lab 04/19/19 0403 04/20/19 0707 04/15/2019 0349 04/17/2019 2234 04/22/19 0202 04/23/19 0413 04/24/19 0543  NA 138 140 136 138 138 137 138  K 4.1 3.7 4.2 3.7 3.7 4.0 4.0  CL 108 110 110 113*  113* 114* 114*  CO2 19* 18* 16* 16* 18* 11* 12*  GLUCOSE 364* 192* 497* 213* 248* 266* 164*  BUN 34* 32* 31* 30* 28* 34* 40*  CREATININE 2.32* 2.03* 2.15* 2.16* 2.11* 2.50* 2.94*  CALCIUM 7.9* 7.9* 7.7* 7.7* 7.4* 7.8* 7.8*  MG 1.8 1.7 1.6*  --  1.5* 1.7  --    GFR: Estimated Creatinine Clearance: 28.6 mL/min (A) (by C-G formula based on SCr of 2.94 mg/dL (H)). Liver Function Tests: Recent Labs  Lab 04/19/19 0403  AST 93*  ALT 41  ALKPHOS 108  BILITOT 0.4  PROT 6.1*  ALBUMIN 1.8*   No results for input(s): LIPASE, AMYLASE in the last 168 hours. No results for input(s): AMMONIA in the last 168 hours. Coagulation Profile: No results for input(s): INR, PROTIME in the last 168 hours. Cardiac Enzymes: No results for input(s): CKTOTAL, CKMB, CKMBINDEX, TROPONINI in the last 168 hours. BNP (last 3 results) No results for input(s): PROBNP in the last 8760 hours. HbA1C: No results for input(s): HGBA1C in the last 72 hours. CBG: Recent Labs  Lab 04/23/19 2113 04/23/19 2200 04/24/19 0520 04/24/19 0813 04/24/19 1137  GLUCAP 50* 107* 137* 166* 169*   Lipid Profile: No results for input(s): CHOL, HDL, LDLCALC, TRIG, CHOLHDL, LDLDIRECT in the last 72 hours. Thyroid Function Tests: Recent Labs    04/24/19 0741  TSH 1.313  FREET4 0.93   Anemia Panel: No  results for input(s): VITAMINB12, FOLATE, FERRITIN, TIBC, IRON, RETICCTPCT in the last 72 hours. Sepsis Labs: Recent Labs  Lab 04/22/2019 2234 04/22/19 0202  LATICACIDVEN 3.0* 0.9    Recent Results (from the past 240 hour(s))  Culture, blood (routine x 2)     Status: None (Preliminary result)   Collection Time: 05/03/2019 10:18 PM   Specimen: BLOOD LEFT HAND  Result Value Ref Range Status   Specimen Description BLOOD LEFT HAND  Final   Special Requests   Final    BOTTLES DRAWN AEROBIC AND ANAEROBIC Blood Culture adequate volume   Culture   Final    NO GROWTH 3 DAYS Performed at [facility], [place] [address],  [place], [place] [address]    Report Status PENDING  Incomplete  Culture, blood (routine x 2)     Status: None (Preliminary result)   Collection Time: 04/18/2019 10:25 PM   Specimen: BLOOD RIGHT HAND  Result Value Ref Range Status   Specimen Description BLOOD RIGHT HAND  Final   Special Requests   Final    BOTTLES DRAWN AEROBIC AND ANAEROBIC Blood Culture adequate volume   Culture   Final    NO GROWTH 3 DAYS Performed at [facility], [place] [address], [place], [place] [address]    Report Status PENDING  Incomplete  Culture, Urine     Status: Abnormal   Collection Time: 04/22/19  8:46 AM   Specimen: Urine, Suprapubic  Result Value Ref Range Status   Specimen Description URINE, SUPRAPUBIC  Final   Special Requests   Final    NONE Performed at [facility], [place] [address], [place], [place] [address]    Culture 70,000 COLONIES/mL YEAST (A)  Final   Report Status 04/23/2019 FINAL  Final  MRSA PCR Screening     Status: None   Collection Time: 04/22/19  3:56 PM   Specimen: Nasal Mucosa; Nasopharyngeal  Result Value Ref Range Status   MRSA by PCR NEGATIVE NEGATIVE Final    Comment:        The GeneXpert MRSA Assay (FDA approved for NASAL specimens only), is one component of a comprehensive MRSA colonization surveillance program. It is not intended to diagnose MRSA infection nor to guide or monitor treatment for MRSA infections. Performed at [facility], [place] [address] [place], [place] [address]          Radiology Studies: Dg Chest Port 1 View  Result Date: 04/24/2019 CLINICAL DATA:  Respiratory distress, pneumonia EXAM: PORTABLE CHEST 1 VIEW COMPARISON:  04/22/2019 FINDINGS: Patchy bilateral airspace opacities throughout both lungs, right greater than left have increased slightly since prior study. Small left pleural effusion and moderate right pleural effusion, similar prior study. Heart is borderline in size. IMPRESSION: Patchy bilateral airspace opacities,  right greater than left most compatible with pneumonia. Stable bilateral effusions also greater on the right. Electronically Signed   By: Rolm Baptise M.D.   On: 04/24/2019 11:36   Dg Chest Port 1 View  Result Date: 04/23/2019 CLINICAL DATA:  Fever, wheezing. EXAM: PORTABLE CHEST 1 VIEW COMPARISON:  Radiograph April 21, 2019. FINDINGS: Stable cardiomediastinal silhouette. No pneumothorax is noted. Mildly increased bilateral perihilar opacities are noted concerning for possible edema or pneumonia. Increased mild loculated right pleural effusion is noted. Bony thorax is unremarkable. IMPRESSION: Mildly increased bilateral perihilar opacities are noted concerning for possible edema or pneumonia. Increased mild loculated right pleural effusion is noted. Electronically Signed   By: Jeneen Rinks  Murlean Caller M.D.   On: 04/23/2019 09:48   Dg Swallowing Func-speech Pathology  Result Date: 04/23/2019 Objective Swallowing Evaluation: Type of Study: MBS-Modified Barium Swallow Study  Patient Details Name: Jemarcus Meaker MRN: [id number] Date of Birth: [birth year] Today's Date: 04/23/2019 Time: SLP Start Time (ACUTE ONLY): 52 -SLP Stop Time (ACUTE ONLY): 1400 SLP Time Calculation (min) (ACUTE ONLY): 20 min Past Medical History: Past Medical History: Diagnosis Date  Autism   BPH (benign prostatic hyperplasia)   Depression   Diabetes mellitus   Elective mutism   Gastroparesis   Hyperglycemia   Hyperosmolar (nonketotic) coma (May)   Hyponatremia   Mental retardation   Mental retardation   Pneumonia   Renal disorder   Urinary retention   UTI (urinary tract infection)  Past Surgical History: Past Surgical History: Procedure Laterality Date  AMPUTATION Left 05/01/2019  Procedure: AMPUTATION ABOVE KNEE LEFT LEG;  Surgeon: Angelia Mould, MD;  Location: [facility];  Service: Vascular;  Laterality: Left; HPI: 58 y.o.male, SNF resident,with medical history significant forsevere autism with elective mutism,  schizoaffective disorder, diabetes mellitus type 2, chronic kidney disease, chronic suprapubic catheter for urinary strictures, admitted with left heel gangrene.  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 MBS from June 2013 with moderate dysphagia after acute hospitalization with multiple intubations.   Pt noted by nursing to be coughing frequently with meals.   Subjective: pleasant, cooperative Assessment / Plan / Recommendation CHL IP CLINICAL IMPRESSIONS 04/23/2019 Clinical Impression Paient presents with a mild oral and a mild-moderate pharyngeal dysphagia. During oral phase, patient exhibited mastication delay with regular solids and delay in anterior to posterior transit of puree and regular solids boluses with piecemeal swallowing. Pharyngeal phase consisted of delays in swallow initiation to level of vallecular sinus with all tested boluses and flash penetration with thin liquids via cup or straw sip, which occured approximately 50% of the time, but with full clearance of penetrate.  SLP Visit Diagnosis Dysphagia, oropharyngeal phase (R13.12) Attention and concentration deficit following -- Frontal lobe and executive function deficit following -- Impact on safety and function Mild aspiration risk   CHL IP TREATMENT RECOMMENDATION 04/23/2019 Treatment Recommendations Therapy as outlined in treatment plan below   Prognosis 04/23/2019 Prognosis for Safe Diet Advancement Good Barriers to Reach Goals Cognitive deficits;Severity of deficits Barriers/Prognosis Comment -- CHL IP DIET RECOMMENDATION 04/23/2019 SLP Diet Recommendations Regular solids;Thin liquid Liquid Administration via Cup;Straw Medication Administration Whole meds with liquid Compensations Minimize environmental distractions;Slow rate;Small sips/bites Postural Changes --   CHL IP OTHER RECOMMENDATIONS 02/12/2012 Recommended Consults -- Oral Care Recommendations -- Other Recommendations Order thickener from pharmacy;Clarify dietary restrictions   CHL IP FOLLOW UP  RECOMMENDATIONS 04/23/2019 Follow up Recommendations Other (comment)   CHL IP FREQUENCY AND DURATION 04/23/2019 Speech Therapy Frequency (ACUTE ONLY) min 2x/week Treatment Duration 1 week      CHL IP ORAL PHASE 04/23/2019 Oral Phase Impaired Oral - Pudding Teaspoon -- Oral - Pudding Cup -- Oral - Honey Teaspoon -- Oral - Honey Cup -- Oral - Nectar Teaspoon -- Oral - Nectar Cup -- Oral - Nectar Straw -- Oral - Thin Teaspoon NT Oral - Thin Cup WFL Oral - Thin Straw WFL Oral - Puree Reduced posterior propulsion;Delayed oral transit;Piecemeal swallowing Oral - Mech Soft -- Oral - Regular Piecemeal swallowing;Delayed oral transit;Impaired mastication Oral - Multi-Consistency -- Oral - Pill -- Oral Phase - Comment --  CHL IP PHARYNGEAL PHASE 04/23/2019 Pharyngeal Phase Impaired Pharyngeal- Pudding Teaspoon -- Pharyngeal -- Pharyngeal- Pudding Cup -- Pharyngeal -- Pharyngeal- Honey Teaspoon --  Pharyngeal -- Pharyngeal- Honey Cup -- Pharyngeal -- Pharyngeal- Nectar Teaspoon -- Pharyngeal -- Pharyngeal- Nectar Cup -- Pharyngeal -- Pharyngeal- Nectar Straw -- Pharyngeal -- Pharyngeal- Thin Teaspoon -- Pharyngeal -- Pharyngeal- Thin Cup Delayed swallow initiation-vallecula;Penetration/Aspiration during swallow Pharyngeal Material enters airway, remains ABOVE vocal cords then ejected out Pharyngeal- Thin Straw Penetration/Aspiration during swallow;Delayed swallow initiation-vallecula Pharyngeal Material enters airway, remains ABOVE vocal cords then ejected out Pharyngeal- Puree Delayed swallow initiation-vallecula Pharyngeal -- Pharyngeal- Mechanical Soft -- Pharyngeal -- Pharyngeal- Regular Delayed swallow initiation-vallecula Pharyngeal -- Pharyngeal- Multi-consistency -- Pharyngeal -- Pharyngeal- Pill -- Pharyngeal -- Pharyngeal Comment --  CHL IP CERVICAL ESOPHAGEAL PHASE 04/23/2019 Cervical Esophageal Phase WFL Pudding Teaspoon -- Pudding Cup -- Honey Teaspoon -- Honey Cup -- Nectar Teaspoon -- Nectar Cup -- Nectar Straw --  Thin Teaspoon -- Thin Cup -- Thin Straw -- Puree -- Mechanical Soft -- Regular -- Multi-consistency -- Pill -- Cervical Esophageal Comment -- Dannial Monarch 04/23/2019, 4:32 PM  Sonia Baller, MA, CCC-SLP Speech Therapy [facility] Acute Rehab Pager: [phone]             Vas [place] Abi With/wo Tbi  Result Date: 04/23/2019 LOWER EXTREMITY DOPPLER STUDY Indications: Peripheral artery disease.  Vascular Interventions: Left AKA. Comparison Study: No previous study available for comparison Performing Technologist: Birdena Crandall, [place]  Examination Guidelines: A complete evaluation includes at minimum, Doppler waveform signals and systolic blood pressure reading at the level of bilateral brachial, anterior tibial, and posterior tibial arteries, when vessel segments are accessible. Bilateral testing is considered an integral part of a complete examination. Photoelectric Plethysmograph (PPG) waveforms and toe systolic pressure readings are included as required and additional duplex testing as needed. Limited examinations for reoccurring indications may be performed as noted.  ABI Findings: +---------+------------------+-----+---------+----------------+  Right     Rt Pressure (mmHg) Index Waveform  Comment           +---------+------------------+-----+---------+----------------+  Brachial  155                      triphasic                   +---------+------------------+-----+---------+----------------+  PTA       255                1.65  biphasic  non compressible  +---------+------------------+-----+---------+----------------+  DP        255                1.65  triphasic non compressible  +---------+------------------+-----+---------+----------------+  Great Toe 51                 0.33                              +---------+------------------+-----+---------+----------------+ +---------+------------------+-----+---------+-------+  Left      Lt Pressure (mmHg) Index Waveform  Comment   +---------+------------------+-----+---------+-------+  Brachial  115                      triphasic          +---------+------------------+-----+---------+-------+  PTA                                          AKA      +---------+------------------+-----+---------+-------+  DP  AKA      +---------+------------------+-----+---------+-------+  Great Toe                                    AKA      +---------+------------------+-----+---------+-------+ +-------+-----------+-----------+------------+------------+  ABI/TBI Today's ABI Today's TBI Previous ABI Previous TBI  +-------+-----------+-----------+------------+------------+  Right   1.65        0.33        no previous  no previous   +-------+-----------+-----------+------------+------------+  Left    AKA                     no previous  no previous   +-------+-----------+-----------+------------+------------+ Arterial wall calcification precludes accurate ankle pressures and ABIs.  Summary: Right: Resting right ankle-brachial index indicates noncompressible right lower extremity arteries.The right toe-brachial index is abnormal. Left: Left Above the knee amputation.  *See table(s) above for measurements and observations.  Electronically signed by Servando Snare MD on 04/23/2019 at 1:25:35 PM.    Final    Vas [place] Lower Extremity Arterial Duplex  Result Date: 04/23/2019 LOWER EXTREMITY ARTERIAL DUPLEX STUDY Indications: Peripheral artery disease.  Current ABI: Non compressible Limitations: Positionimg of the patient and respiratory interference.              Overpowering venous signal Comparison       No right lower extremity arterial duplex available for Study:           comparison Performing Technologist: Toma Copier RVS  Examination Guidelines: A complete evaluation includes B-mode imaging, spectral Doppler, color Doppler, and power Doppler as needed of all accessible portions of each vessel. Bilateral testing is  considered an integral part of a complete examination. Limited examinations for reoccurring indications may be performed as noted.  +----------+--------+-----+--------+---------+---------------------------------+  RIGHT      PSV cm/s Ratio Stenosis Waveform  Comments                           +----------+--------+-----+--------+---------+---------------------------------+  CFA Prox   93                      triphasic mild plaque                        +----------+--------+-----+--------+---------+---------------------------------+  DFA        107                     triphasic mild plaque                        +----------+--------+-----+--------+---------+---------------------------------+  SFA Prox   120                     triphasic mild plaque                        +----------+--------+-----+--------+---------+---------------------------------+  SFA Mid    92                      triphasic mild plaque                        +----------+--------+-----+--------+---------+---------------------------------+  SFA Distal 100  triphasic mild plaque                        +----------+--------+-----+--------+---------+---------------------------------+  POP Prox   78                      triphasic mild plaque                        +----------+--------+-----+--------+---------+---------------------------------+  POP Distal 79                      biphasic  mild plaque                        +----------+--------+-----+--------+---------+---------------------------------+  ATA Distal 37                                                                   +----------+--------+-----+--------+---------+---------------------------------+  PTA Prox   50                                Difficult to evaluate waveforms                                                  due to strong venous signal                                                      moderate plaque                     +----------+--------+-----+--------+---------+---------------------------------+  PTA Mid    29                                mild plaque                        +----------+--------+-----+--------+---------+---------------------------------+  PTA Distal 66                                                                   +----------+--------+-----+--------+---------+---------------------------------+  Summary: Right: ABIs are unreliable. There is no obvious evidence of significant stenosis. However there is calcification of the arteries worsening moving distally from the thigh to the ankle.  See table(s) above for measurements and observations.    Preliminary         Scheduled Meds:  sodium chloride   Intravenous Once   Chlorhexidine Gluconate Cloth  6 each Topical Q0600   feeding supplement (GLUCERNA SHAKE)  237 mL Oral TID BM   feeding supplement (PRO-STAT SUGAR FREE 64)  30 mL Oral TID WC   folic acid  1 mg Oral Daily   furosemide  40 mg Intravenous BID   heparin  5,000 Units Subcutaneous Q8H   insulin aspart  0-9 Units Subcutaneous TID WC   insulin aspart  2 Units Subcutaneous TID WC   insulin detemir  7 Units Subcutaneous Daily   levalbuterol  0.63 mg Nebulization Q6H   levETIRAcetam  250 mg Oral BID   mirtazapine  15 mg Oral QHS   multivitamin with minerals  1 tablet Oral Daily   nutrition supplement (JUVEN)  1 packet Oral BID BM   pantoprazole  40 mg Oral Q0600   sertraline  200 mg Oral Daily   sodium chloride flush  3 mL Intravenous Q12H   ziprasidone  20 mg Oral Daily   ziprasidone  40 mg Oral QHS   Continuous Infusions:  [START ON 04/25/2019] ceFEPime (MAXIPIME) IV     magnesium sulfate bolus IVPB     metronidazole 500 mg (04/24/19 1310)    sodium bicarbonate (isotonic) infusion in sterile water 75 mL/hr at 04/24/19 1144     LOS: 12 days        Alma Friendly, MD [facility] 04/24/2019, 1:13 PM

## 2019-04-24 NOTE — Progress Notes (Signed)
RT note-ABG performed and Bipap placed at this time for work of breathing. RN aware.

## 2019-04-24 NOTE — Progress Notes (Addendum)
VASCULAR SURGERY ASSESSMENT & PLAN:   POD  S/P: LEFT AKA: I changed his dressing this morning.  The amputation site looks fine with no evidence of infection.  Continue daily dressing changes.  PERIPHERAL VASCULAR DISEASE:He has no wounds on the right foot but I have obtained noninvasive arterial studies on the right to have as a baseline.  These show normal waveforms in the right foot with an ABI of greater than 100%.  This may be falsely elevated because of calcific disease however toe pressures 51 which is reasonable. We will need to follow this foot closely as an outpatient.  FEVER: The left above-the-knee amputation site does not appear to be a source for his fever.  I inspected this this morning and there was no erythema or drainage.  There were no wounds on his right foot.He continues intravenous antibiotics. He is on intravenous Maxipime, Flagyl, and Vanco.  We will need to look for other sources of infection.  ID is following patient. Should Covid be repeated?  DVT PROPHYLAXIS:He is on subcu heparin  I will check back on Monday.  If any problems over the weekend Dr. Carlis Abbott is on call.  SUBJECTIVE:   Having chills this morning.  PHYSICAL EXAM:   Vitals:   04/24/19 0500 04/24/19 0700 04/24/19 0725 04/24/19 0742  BP:   112/60   Pulse: (!) 105 (!) 101 (!) 101   Resp: (!) 29 (!) 26 (!) 21   Temp:      TempSrc:      SpO2: 93% 95% 96% 97%  Weight:      Height:       I inspected his left above-the-knee amputation site and this is healing well. There are no wounds on the right foot.   LABS:   Lab Results  Component Value Date   WBC 12.7 (H) 04/24/2019   HGB 7.5 (L) 04/24/2019   HCT 23.9 (L) 04/24/2019   MCV 82.4 04/24/2019   PLT 320 04/24/2019   Lab Results  Component Value Date   CREATININE 2.94 (H) 04/24/2019   Lab Results  Component Value Date   INR 1.24 02/16/2012   CBG (last 3)  Recent Labs    04/23/19 2200 04/24/19 0520 04/24/19 0813  GLUCAP  107* 137* 166*    PROBLEM LIST:    Principal Problem:   Postoperative fever Active Problems:   Intellectual disability   Acute kidney injury superimposed on CKD (HCC)   Chronic indwelling Foley catheter   Diabetes mellitus out of control (HCC)   Anemia, blood loss   Leukocytosis   History of seizure   Gangrene (HCC)   Pressure injury of skin   Folate deficiency   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 sodium chloride   Intravenous Once  . Chlorhexidine Gluconate Cloth  6 each Topical Q0600  . feeding supplement (GLUCERNA SHAKE)  237 mL Oral TID BM  . feeding supplement (PRO-STAT SUGAR FREE 64)  30 mL Oral TID WC  . folic acid  1 mg Oral Daily  . furosemide  40 mg Intravenous Daily  . heparin  5,000 Units Subcutaneous Q8H  . insulin aspart  0-9 Units Subcutaneous TID WC  . insulin aspart  2 Units Subcutaneous TID WC  . insulin detemir  7 Units Subcutaneous Daily  . levalbuterol  0.63 mg Nebulization Q6H  . levETIRAcetam  250 mg Oral BID  . metroNIDAZOLE  500 mg Oral Q8H  . mirtazapine  15 mg  Oral QHS  . multivitamin with minerals  1 tablet Oral Daily  . nutrition supplement (JUVEN)  1 packet Oral BID BM  . pantoprazole  40 mg Oral Q0600  . sertraline  200 mg Oral Daily  . sodium chloride flush  3 mL Intravenous Q12H  . ziprasidone  20 mg Oral Daily  . ziprasidone  40 mg Oral QHS    [name] Beeper: [id number] Office: [phone] 04/24/2019

## 2019-04-24 NOTE — Progress Notes (Signed)
This note also relates to the following rows which could not be included: Pulse Rate - Cannot attach notes to unvalidated device data Resp - Cannot attach notes to unvalidated device data SpO2 - Cannot attach notes to unvalidated device data  RT note-Dr. Elsworth Soho at the bedside, will transfer the patient to ICU, continue to monitor.

## 2019-04-24 NOTE — Progress Notes (Signed)
SLP Cancellation Note  Patient Details Name: [name] MRN: [id number] DOB: 1961/07/26   Cancelled treatment:  Pt transferred to ICU due to acute respiratory distress; now on BiPAP. Had MBS yesterday which revealed mild dysphagia with penetration of liquids but no aspiration per report.  Will follow along.  Demaris Leavell L. Tivis Ringer, [place] Office number [phone] Pager [phone]        [name] 04/24/2019, 1:12 PM

## 2019-04-25 ENCOUNTER — Inpatient Hospital Stay (HOSPITAL_COMMUNITY): Payer: Medicaid Other

## 2019-04-25 DIAGNOSIS — I471 Supraventricular tachycardia: Secondary | ICD-10-CM

## 2019-04-25 DIAGNOSIS — J690 Pneumonitis due to inhalation of food and vomit: Secondary | ICD-10-CM

## 2019-04-25 DIAGNOSIS — R251 Tremor, unspecified: Secondary | ICD-10-CM

## 2019-04-25 LAB — POCT I-STAT 7, (LYTES, BLD GAS, ICA,H+H)
Acid-base deficit: 11 mmol/L — ABNORMAL HIGH (ref 0.0–2.0)
Bicarbonate: 13.9 mmol/L — ABNORMAL LOW (ref 20.0–28.0)
Calcium, Ion: 1.17 mmol/L (ref 1.15–1.40)
HCT: 21 % — ABNORMAL LOW (ref 39.0–52.0)
Hemoglobin: 7.1 g/dL — ABNORMAL LOW (ref 13.0–17.0)
O2 Saturation: 84 %
Patient temperature: 100.3
Potassium: 4 mmol/L (ref 3.5–5.1)
Sodium: 145 mmol/L (ref 135–145)
TCO2: 15 mmol/L — ABNORMAL LOW (ref 22–32)
pCO2 arterial: 29.1 mmHg — ABNORMAL LOW (ref 32.0–48.0)
pH, Arterial: 7.291 — ABNORMAL LOW (ref 7.350–7.450)
pO2, Arterial: 56 mmHg — ABNORMAL LOW (ref 83.0–108.0)

## 2019-04-25 LAB — GLUCOSE, CAPILLARY
Glucose-Capillary: 185 mg/dL — ABNORMAL HIGH (ref 70–99)
Glucose-Capillary: 199 mg/dL — ABNORMAL HIGH (ref 70–99)
Glucose-Capillary: 127 mg/dL — ABNORMAL HIGH (ref 70–99)
Glucose-Capillary: 50 mg/dL — ABNORMAL LOW (ref 70–99)
Glucose-Capillary: 97 mg/dL (ref 70–99)
Glucose-Capillary: 77 mg/dL (ref 70–99)

## 2019-04-25 LAB — POCT I-STAT EG7
Acid-base deficit: 5 mmol/L — ABNORMAL HIGH (ref 0.0–2.0)
Bicarbonate: 21.4 mmol/L (ref 20.0–28.0)
Calcium, Ion: 1.13 mmol/L — ABNORMAL LOW (ref 1.15–1.40)
HCT: 23 % — ABNORMAL LOW (ref 39.0–52.0)
Hemoglobin: 7.8 g/dL — ABNORMAL LOW (ref 13.0–17.0)
O2 Saturation: 55 %
Patient temperature: 98.6
Potassium: 3.5 mmol/L (ref 3.5–5.1)
Sodium: 145 mmol/L (ref 135–145)
TCO2: 23 mmol/L (ref 22–32)
pCO2, Ven: 44.3 mmHg (ref 44.0–60.0)
pH, Ven: 7.293 (ref 7.250–7.430)
pO2, Ven: 32 mmHg (ref 32.0–45.0)

## 2019-04-25 LAB — HEPATIC FUNCTION PANEL
ALT: 21 U/L (ref 0–44)
AST: 102 U/L — ABNORMAL HIGH (ref 15–41)
Albumin: 1.3 g/dL — ABNORMAL LOW (ref 3.5–5.0)
Alkaline Phosphatase: 136 U/L — ABNORMAL HIGH (ref 38–126)
Bilirubin, Direct: 0.3 mg/dL — ABNORMAL HIGH (ref 0.0–0.2)
Indirect Bilirubin: 0.4 mg/dL (ref 0.3–0.9)
Total Bilirubin: 0.7 mg/dL (ref 0.3–1.2)
Total Protein: 5.1 g/dL — ABNORMAL LOW (ref 6.5–8.1)

## 2019-04-25 LAB — CBC WITH DIFFERENTIAL/PLATELET
Abs Immature Granulocytes: 0 10*3/uL (ref 0.00–0.07)
Basophils Absolute: 0 10*3/uL (ref 0.0–0.1)
Basophils Relative: 0 %
Eosinophils Absolute: 0 10*3/uL (ref 0.0–0.5)
Eosinophils Relative: 0 %
HCT: 24.1 % — ABNORMAL LOW (ref 39.0–52.0)
Hemoglobin: 7.8 g/dL — ABNORMAL LOW (ref 13.0–17.0)
Lymphocytes Relative: 4 %
Lymphs Abs: 0.8 10*3/uL (ref 0.7–4.0)
MCH: 25.8 pg — ABNORMAL LOW (ref 26.0–34.0)
MCHC: 32.4 g/dL (ref 30.0–36.0)
MCV: 79.8 fL — ABNORMAL LOW (ref 80.0–100.0)
Monocytes Absolute: 0 10*3/uL — ABNORMAL LOW (ref 0.1–1.0)
Monocytes Relative: 0 %
Neutro Abs: 20 10*3/uL — ABNORMAL HIGH (ref 1.7–7.7)
Neutrophils Relative %: 96 %
Platelets: 330 10*3/uL (ref 150–400)
RBC: 3.02 MIL/uL — ABNORMAL LOW (ref 4.22–5.81)
RDW: 18.2 % — ABNORMAL HIGH (ref 11.5–15.5)
WBC: 20.8 10*3/uL — ABNORMAL HIGH (ref 4.0–10.5)
nRBC: 0 /100 WBC
nRBC: 0.1 % (ref 0.0–0.2)

## 2019-04-25 LAB — BASIC METABOLIC PANEL
Anion gap: 13 (ref 5–15)
Anion gap: 10 (ref 5–15)
BUN: 49 mg/dL — ABNORMAL HIGH (ref 6–20)
BUN: 51 mg/dL — ABNORMAL HIGH (ref 6–20)
CO2: 16 mmol/L — ABNORMAL LOW (ref 22–32)
CO2: 20 mmol/L — ABNORMAL LOW (ref 22–32)
Calcium: 8 mg/dL — ABNORMAL LOW (ref 8.9–10.3)
Calcium: 7.6 mg/dL — ABNORMAL LOW (ref 8.9–10.3)
Chloride: 113 mmol/L — ABNORMAL HIGH (ref 98–111)
Chloride: 113 mmol/L — ABNORMAL HIGH (ref 98–111)
Creatinine, Ser: 3.6 mg/dL — ABNORMAL HIGH (ref 0.61–1.24)
Creatinine, Ser: 3.95 mg/dL — ABNORMAL HIGH (ref 0.61–1.24)
GFR calc Af Amer: 20 mL/min — ABNORMAL LOW (ref >60)
GFR calc Af Amer: 18 mL/min — ABNORMAL LOW (ref >60)
GFR calc non Af Amer: 18 mL/min — ABNORMAL LOW (ref >60)
GFR calc non Af Amer: 16 mL/min — ABNORMAL LOW (ref >60)
Glucose, Bld: 186 mg/dL — ABNORMAL HIGH (ref 70–99)
Glucose, Bld: 99 mg/dL (ref 70–99)
Potassium: 3.8 mmol/L (ref 3.5–5.1)
Potassium: 3.6 mmol/L (ref 3.5–5.1)
Sodium: 142 mmol/L (ref 135–145)
Sodium: 143 mmol/L (ref 135–145)

## 2019-04-25 LAB — MAGNESIUM: Magnesium: 1.8 mg/dL (ref 1.7–2.4)

## 2019-04-25 LAB — PHOSPHORUS: Phosphorus: 3.6 mg/dL (ref 2.5–4.6)

## 2019-04-25 MED ORDER — ROCURONIUM BROMIDE 50 MG/5ML IV SOLN
100.0000 mg | Freq: Once | INTRAVENOUS | Status: AC
  Administered 2019-04-25: 100 mg via INTRAVENOUS

## 2019-04-25 MED ORDER — FENTANYL CITRATE (PF) 100 MCG/2ML IJ SOLN
INTRAMUSCULAR | Status: AC
  Administered 2019-04-25: 100 ug via INTRAVENOUS
  Filled 2019-04-25: qty 2

## 2019-04-25 MED ORDER — POTASSIUM CHLORIDE 10 MEQ/100ML IV SOLN
10.0000 meq | INTRAVENOUS | Status: AC
  Administered 2019-04-25 (×3): 10 meq via INTRAVENOUS
  Filled 2019-04-25 (×3): qty 100

## 2019-04-25 MED ORDER — POTASSIUM CHLORIDE 10 MEQ/100ML IV SOLN
10.0000 meq | INTRAVENOUS | Status: AC
  Administered 2019-04-25 (×2): 10 meq via INTRAVENOUS
  Filled 2019-04-25 (×2): qty 100

## 2019-04-25 MED ORDER — PHENYLEPHRINE HCL-NACL 10-0.9 MG/250ML-% IV SOLN
0.0000 ug/min | INTRAVENOUS | Status: DC
  Administered 2019-04-25: 100 ug/min via INTRAVENOUS
  Administered 2019-04-25: 10:00:00 25 ug/min via INTRAVENOUS
  Administered 2019-04-25: 100 ug/min via INTRAVENOUS
  Administered 2019-04-25: 28 ug/min via INTRAVENOUS
  Filled 2019-04-25 (×3): qty 250

## 2019-04-25 MED ORDER — FUROSEMIDE 10 MG/ML IJ SOLN
40.0000 mg | Freq: Once | INTRAMUSCULAR | Status: AC
  Administered 2019-04-25: 06:00:00 40 mg via INTRAVENOUS
  Filled 2019-04-25: qty 4

## 2019-04-25 MED ORDER — ETOMIDATE 2 MG/ML IV SOLN
20.0000 mg | Freq: Once | INTRAVENOUS | Status: AC
  Administered 2019-04-25: 20 mg via INTRAVENOUS

## 2019-04-25 MED ORDER — DEXTROSE 50 % IV SOLN
INTRAVENOUS | Status: AC
  Administered 2019-04-25: 25 mL
  Filled 2019-04-25: qty 50

## 2019-04-25 MED ORDER — AMIODARONE HCL IN DEXTROSE 360-4.14 MG/200ML-% IV SOLN
60.0000 mg/h | INTRAVENOUS | Status: AC
  Administered 2019-04-25 (×2): 60 mg/h via INTRAVENOUS

## 2019-04-25 MED ORDER — MIDAZOLAM HCL 2 MG/2ML IJ SOLN
INTRAMUSCULAR | Status: AC
  Administered 2019-04-25: 6 mg via INTRAVENOUS
  Filled 2019-04-25: qty 2

## 2019-04-25 MED ORDER — MAGNESIUM SULFATE 2 GM/50ML IV SOLN
2.0000 g | Freq: Once | INTRAVENOUS | Status: AC
  Administered 2019-04-25: 2 g via INTRAVENOUS
  Filled 2019-04-25: qty 50

## 2019-04-25 MED ORDER — MIDAZOLAM HCL 2 MG/2ML IJ SOLN
6.0000 mg | Freq: Once | INTRAMUSCULAR | Status: AC
  Administered 2019-04-25: 17:00:00 6 mg via INTRAVENOUS

## 2019-04-25 MED ORDER — FENTANYL CITRATE (PF) 100 MCG/2ML IJ SOLN
100.0000 ug | Freq: Once | INTRAMUSCULAR | Status: AC
  Administered 2019-04-25: 17:00:00 100 ug via INTRAVENOUS

## 2019-04-25 MED ORDER — MIDAZOLAM HCL 2 MG/2ML IJ SOLN
INTRAMUSCULAR | Status: AC
  Filled 2019-04-25: qty 4

## 2019-04-25 MED ORDER — PHENYLEPHRINE HCL-NACL 10-0.9 MG/250ML-% IV SOLN
INTRAVENOUS | Status: AC
  Administered 2019-04-25: 25 ug/min via INTRAVENOUS
  Filled 2019-04-25: qty 250

## 2019-04-25 MED ORDER — MIDAZOLAM HCL 2 MG/2ML IJ SOLN: 2.0000 mg | Freq: Once | INTRAMUSCULAR | Status: AC

## 2019-04-25 MED ORDER — FENTANYL CITRATE (PF) 100 MCG/2ML IJ SOLN: 100.0000 ug | Freq: Once | INTRAMUSCULAR | Status: AC

## 2019-04-25 MED ORDER — PHENYLEPHRINE HCL-NACL 40-0.9 MG/250ML-% IV SOLN
0.0000 ug/min | INTRAVENOUS | Status: DC
  Administered 2019-04-25: 22:00:00 150 ug/min via INTRAVENOUS
  Administered 2019-04-26: 175 ug/min via INTRAVENOUS
  Administered 2019-04-26: 170 ug/min via INTRAVENOUS
  Administered 2019-04-26: 140 ug/min via INTRAVENOUS
  Filled 2019-04-25 (×5): qty 250

## 2019-04-25 MED ORDER — DEXMEDETOMIDINE HCL IN NACL 400 MCG/100ML IV SOLN
0.4000 ug/kg/h | INTRAVENOUS | Status: DC
  Administered 2019-04-25: 21:00:00 0.8 ug/kg/h via INTRAVENOUS
  Administered 2019-04-25: 1.2 ug/kg/h via INTRAVENOUS
  Filled 2019-04-25 (×4): qty 100

## 2019-04-25 MED ORDER — SODIUM BICARBONATE 8.4 % IV SOLN
50.0000 meq | Freq: Once | INTRAVENOUS | Status: AC
  Administered 2019-04-25: 06:00:00 50 meq via INTRAVENOUS
  Filled 2019-04-25: qty 50

## 2019-04-25 MED ORDER — FUROSEMIDE 10 MG/ML IJ SOLN
20.0000 mg | Freq: Once | INTRAMUSCULAR | Status: AC
  Administered 2019-04-25: 20 mg via INTRAVENOUS
  Filled 2019-04-25: qty 2

## 2019-04-25 MED ORDER — ROCURONIUM BROMIDE 50 MG/5ML IV SOLN
100.0000 mg | Freq: Once | INTRAVENOUS | Status: AC
  Filled 2019-04-25 (×2): qty 10

## 2019-04-25 MED ORDER — AMIODARONE HCL IN DEXTROSE 360-4.14 MG/200ML-% IV SOLN
30.0000 mg/h | INTRAVENOUS | Status: DC
  Administered 2019-04-25 – 2019-04-28 (×8): 30 mg/h via INTRAVENOUS
  Filled 2019-04-25 (×8): qty 200

## 2019-04-25 MED ORDER — PHENYLEPHRINE HCL-NACL 10-0.9 MG/250ML-% IV SOLN
0.0000 ug/min | INTRAVENOUS | Status: DC
  Administered 2019-04-25: 21:00:00 150 ug/min via INTRAVENOUS

## 2019-04-25 MED ORDER — AMIODARONE HCL IN DEXTROSE 360-4.14 MG/200ML-% IV SOLN
INTRAVENOUS | Status: AC
  Administered 2019-04-25: 09:00:00 150 mg via INTRAVENOUS
  Filled 2019-04-25: qty 200

## 2019-04-25 MED ORDER — AMIODARONE LOAD VIA INFUSION
150.0000 mg | Freq: Once | INTRAVENOUS | Status: AC
  Administered 2019-04-25: 09:00:00 150 mg via INTRAVENOUS

## 2019-04-25 NOTE — Plan of Care (Signed)
Spoke with his guardian to update her regarding his renal function.  She would want him to have dialysis if the need arose.    Harrie Jeans, MD

## 2019-04-25 NOTE — Procedures (Signed)
Intubation Procedure Note [name] [id number] 1961/04/17  Procedure: Intubation Indications: Airway protection and maintenance  Procedure Details Consent: Unable to obtain consent because of altered level of consciousness. Time Out: Verified patient identification, verified procedure, site/side was marked, verified correct patient position, special equipment/implants available, medications/allergies/relevent history reviewed, required imaging and test results available.  Performed  Maximum sterile technique was used including cap, gloves, gown, hand hygiene and mask.  MAC and 4    Evaluation Hemodynamic Status: BP stable throughout; O2 sats: stable throughout Patient's Current Condition: stable Complications: No apparent complications Patient did tolerate procedure well. Chest X-ray ordered to verify placement.  CXR: pending.   Mcneil Sober 04/25/2019

## 2019-04-25 NOTE — Progress Notes (Signed)
Patient ID: [name], male   DOB: 29-Jun-1961, 58 y.o.   MRN: [id number]         [facility]  Date of Admission:  04/09/2019    Total days of antibiotics 16        Day 7 metronidazole        Day 5 cefepime         ASSESSMENT: He remains febrile with persistent bilateral infiltrates on chest x-ray that probably represent pneumonia and volume overload from his recent atrial fibrillation.  His respiratory status remains quite tenuous.  I will continue current antibiotics for now.  If he requires intubation I will get specimens for Gram stain and culture.  PLAN: 1. Continue current antibiotics  Principal Problem:   Aspiration pneumonia (HCC) Active Problems:   Intellectual disability   Acute kidney injury superimposed on CKD (HCC)   Chronic indwelling Foley catheter   Diabetes mellitus out of control (HCC)   Anemia, blood loss   Leukocytosis   History of seizure   Gangrene (HCC)   Pressure injury of skin   Folate deficiency   Gas gangrene of foot (HCC)   Postoperative fever   Scheduled Meds: . sodium chloride   Intravenous Once  . Chlorhexidine Gluconate Cloth  6 each Topical Q0600  . etomidate  20 mg Intravenous Once  . feeding supplement (GLUCERNA SHAKE)  237 mL Oral TID BM  . feeding supplement (PRO-STAT SUGAR FREE 64)  30 mL Oral TID WC  . fentaNYL      . fentaNYL (SUBLIMAZE) injection  100 mcg Intravenous Once  . folic acid  1 mg Oral Daily  . heparin  5,000 Units Subcutaneous Q8H  . insulin aspart  0-9 Units Subcutaneous TID WC  . insulin aspart  2 Units Subcutaneous TID WC  . insulin detemir  7 Units Subcutaneous Daily  . midazolam      . midazolam  2 mg Intravenous Once  . mirtazapine  15 mg Oral QHS  . multivitamin with minerals  1 tablet Oral Daily  . nutrition supplement (JUVEN)  1 packet Oral BID BM  . pantoprazole (PROTONIX) IV  40 mg Intravenous Q24H  . rocuronium  100 mg Intravenous Once  . sertraline  200 mg Oral Daily   . sodium chloride flush  3 mL Intravenous Q12H  . ziprasidone  40 mg Intramuscular QHS   Continuous Infusions: . sodium chloride Stopped (04/25/19 0346)  . amiodarone 60 mg/hr (04/25/19 1200)  . amiodarone    . ceFEPime (MAXIPIME) IV Stopped (04/25/19 0305)  . levETIRAcetam 250 mg (04/25/19 1103)  . magnesium sulfate bolus IVPB    . metronidazole Stopped (04/25/19 [id number])  . phenylephrine (NEO-SYNEPHRINE) Adult infusion 23 mcg/min (04/25/19 1200)  .  sodium bicarbonate (isotonic) infusion in sterile water 50 mL/hr at 04/25/19 1228   PRN Meds:.sodium chloride, acetaminophen **OR** acetaminophen, ipratropium-albuterol, LORazepam, magnesium sulfate bolus IVPB, metoprolol tartrate, morphine injection, ondansetron **OR** ondansetron (ZOFRAN) IV, oxyCODONE-acetaminophen  Review of Systems: Review of Systems  Unable to perform ROS: Mental acuity    No Known Allergies  OBJECTIVE: Vitals:   04/25/19 1145 04/25/19 1200 04/25/19 1214 04/25/19 1215  BP: 104/62 (!) 83/52  (!) 78/47  Pulse: 89 90  92  Resp: (!) 24 (!) 28  (!) 24  Temp:   100.3 F (37.9 C)   TempSrc:   Axillary   SpO2: 94% 94%  92%  Weight:      Height:  Body mass index is 25.83 kg/m.  Physical Exam Constitutional:      Comments: He remains on BiPAP.  He does not answer questions.  Cardiovascular:     Rate and Rhythm: Normal rate and regular rhythm.     Heart sounds: No murmur.  Pulmonary:     Breath sounds: No wheezing, rhonchi or rales.  Neurological:     Comments: Question chronic tremor of his upper body.     Lab Results Lab Results  Component Value Date   WBC 12.7 (H) 04/24/2019   HGB 7.1 (L) 04/25/2019   HCT 21.0 (L) 04/25/2019   MCV 82.4 04/24/2019   PLT 320 04/24/2019    Lab Results  Component Value Date   CREATININE 3.60 (H) 04/25/2019   BUN 49 (H) 04/25/2019   NA 145 04/25/2019   K 4.0 04/25/2019   CL 113 (H) 04/25/2019   CO2 16 (L) 04/25/2019    Lab Results  Component Value Date    ALT 41 04/19/2019   AST 93 (H) 04/19/2019   ALKPHOS 108 04/19/2019   BILITOT 0.4 04/19/2019     Microbiology: Recent Results (from the past 240 hour(s))  Culture, blood (routine x 2)     Status: None (Preliminary result)   Collection Time: 04/12/2019 10:18 PM   Specimen: BLOOD LEFT HAND  Result Value Ref Range Status   Specimen Description BLOOD LEFT HAND  Final   Special Requests   Final    BOTTLES DRAWN AEROBIC AND ANAEROBIC Blood Culture adequate volume   Culture   Final    NO GROWTH 4 DAYS Performed at [facility], [address], [place], [place] [address]    Report Status PENDING  Incomplete  Culture, blood (routine x 2)     Status: None (Preliminary result)   Collection Time: 04/13/2019 10:25 PM   Specimen: BLOOD RIGHT HAND  Result Value Ref Range Status   Specimen Description BLOOD RIGHT HAND  Final   Special Requests   Final    BOTTLES DRAWN AEROBIC AND ANAEROBIC Blood Culture adequate volume   Culture   Final    NO GROWTH 4 DAYS Performed at [facility], [place] [address] [place], [place] [address]    Report Status PENDING  Incomplete  Culture, Urine     Status: Abnormal   Collection Time: 04/22/19  8:46 AM   Specimen: Urine, Suprapubic  Result Value Ref Range Status   Specimen Description URINE, SUPRAPUBIC  Final   Special Requests   Final    NONE Performed at [facility], [place] [address] [place], [place] [address]    Culture 70,000 COLONIES/mL YEAST (A)  Final   Report Status 04/23/2019 FINAL  Final  MRSA PCR Screening     Status: None   Collection Time: 04/22/19  3:56 PM   Specimen: Nasal Mucosa; Nasopharyngeal  Result Value Ref Range Status   MRSA by PCR NEGATIVE NEGATIVE Final    Comment:        The GeneXpert MRSA Assay (FDA approved for NASAL specimens only), is one component of a comprehensive MRSA colonization surveillance program. It is not intended to diagnose MRSA infection nor to guide or monitor treatment for MRSA  infections. Performed at [facility], [place] [address] [place], [place] [address]   Culture, blood (routine x 2)     Status: None (Preliminary result)   Collection Time: 04/24/19  1:35 PM   Specimen: BLOOD  Result Value Ref  Range Status   Specimen Description BLOOD RIGHT ANTECUBITAL  Final   Special Requests   Final    BOTTLES DRAWN AEROBIC ONLY Blood Culture adequate volume   Culture   Final    NO GROWTH < 24 HOURS Performed at [facility], [address] [employer], [place] [address]    Report Status PENDING  Incomplete  Culture, blood (routine x 2)     Status: None (Preliminary result)   Collection Time: 04/24/19  1:35 PM   Specimen: BLOOD RIGHT HAND  Result Value Ref Range Status   Specimen Description BLOOD RIGHT HAND  Final   Special Requests   Final    BOTTLES DRAWN AEROBIC ONLY Blood Culture results may not be optimal due to an inadequate volume of blood received in culture bottles   Culture   Final    NO GROWTH < 24 HOURS Performed at [facility], [place] [address] [place], [place] [address]    Report Status PENDING  Incomplete    Michel Bickers, MD [facility] [place] [phone] pager   [phone] cell 04/25/2019, 1:15 PM

## 2019-04-25 NOTE — Progress Notes (Signed)
Progress Note  Patient Name: [name] Date of Encounter: 04/25/2019  Consulting Cardiologist: Kirk Ruths, MD  Patient has continued on BiPAP although plans are in place for mechanical ventilation per CCM.  He has continued to have bursts of SVT up into the 180s and 190s by telemetry and was also started on IV amiodarone.  Inpatient Medications    Scheduled Meds:  sodium chloride   Intravenous Once   Chlorhexidine Gluconate Cloth  6 each Topical Q0600   etomidate  20 mg Intravenous Once   feeding supplement (GLUCERNA SHAKE)  237 mL Oral TID BM   feeding supplement (PRO-STAT SUGAR FREE 64)  30 mL Oral TID WC   fentaNYL       fentaNYL (SUBLIMAZE) injection  100 mcg Intravenous Once   folic acid  1 mg Oral Daily   heparin  5,000 Units Subcutaneous Q8H   insulin aspart  0-9 Units Subcutaneous TID WC   insulin aspart  2 Units Subcutaneous TID WC   insulin detemir  7 Units Subcutaneous Daily   midazolam       midazolam  2 mg Intravenous Once   mirtazapine  15 mg Oral QHS   multivitamin with minerals  1 tablet Oral Daily   nutrition supplement (JUVEN)  1 packet Oral BID BM   pantoprazole (PROTONIX) IV  40 mg Intravenous Q24H   rocuronium  100 mg Intravenous Once   sertraline  200 mg Oral Daily   sodium chloride flush  3 mL Intravenous Q12H   ziprasidone  40 mg Intramuscular QHS   Continuous Infusions:  sodium chloride Stopped (04/25/19 0346)   amiodarone 60 mg/hr (04/25/19 0923)   amiodarone     ceFEPime (MAXIPIME) IV Stopped (04/25/19 0305)   levETIRAcetam Stopped (04/25/19 0006)   magnesium sulfate bolus IVPB     metronidazole Stopped (04/25/19 0653)   phenylephrine (NEO-SYNEPHRINE) Adult infusion     phenylephrine     potassium chloride 10 mEq (04/25/19 0940)    sodium bicarbonate (isotonic) infusion in sterile water Stopped (04/25/19 0825)   PRN Meds: sodium chloride, acetaminophen **OR** acetaminophen,  ipratropium-albuterol, LORazepam, magnesium sulfate bolus IVPB, metoprolol tartrate, morphine injection, ondansetron **OR** ondansetron (ZOFRAN) IV, oxyCODONE-acetaminophen   Vital Signs    Vitals:   04/25/19 0910 04/25/19 0915 04/25/19 0930 04/25/19 0945  BP: (!) 99/54 113/69 (!) 94/45 (!) 103/59  Pulse: (!) 180 73 83 87  Resp: (!) 30 (!) 35 20 20  Temp: 99.9 F (37.7 C)     TempSrc: Axillary     SpO2: 97% 95% 97% 98%  Weight:      Height:        Intake/Output Summary (Last 24 hours) at 04/25/2019 0951 Last data filed at 04/25/2019 0900 Gross per 24 hour  Intake 1816.61 ml  Output 2350 ml  Net -533.39 ml   Filed Weights   04/20/19 0627 04/23/19 0456 04/24/19 0433  Weight: 81.6 kg 85.7 kg 81.6 kg    Telemetry    Sinus rhythm with PACs.  Bursts of fairly regular SVT as fast as 190 bpm noted.  Shorter, irregular episodes do not exclude atrial fibrillation.  Personally reviewed.  ECG    An ECG dated 04/24/2019 was personally reviewed today and demonstrated:  Sinus tachycardia with PACs, nonspecific Oscar changes.  Physical Exam   GEN:  On BiPAP. Cardiac: RRR with ectopy, no gallop.  Respiratory: Scattered rhonchi. GI: Soft, nontender, bowel sounds present. MS: 1+ edema; No deformity. Neuro:   No verbal interaction,  shakes head.  Labs    Chemistry Recent Labs  Lab 04/19/19 0403  04/24/19 1531 04/24/19 1820 04/24/19 2241 04/25/19 0018 04/25/19 0556  NA 138   < > 139 142 144 142 145  K 4.1   < > 6.0* 3.7 3.7 3.8 4.0  CL 108   < > 114* 115*  --  113*  --   CO2 19*   < > 13* 14*  --  16*  --   GLUCOSE 364*   < > 215* 148*  --  186*  --   BUN 34*   < > 49* 47*  --  49*  --   CREATININE 2.32*   < > 3.33* 3.47*  --  3.60*  --   CALCIUM 7.9*   < > 7.8* 8.0*  --  8.0*  --   PROT 6.1*  --   --   --   --   --   --   ALBUMIN 1.8*  --   --   --   --   --   --   AST 93*  --   --   --   --   --   --   ALT 41  --   --   --   --   --   --   ALKPHOS 108  --   --   --   --    --   --   BILITOT 0.4  --   --   --   --   --   --   GFRNONAA 30*   < > 19* 18*  --  18*  --   GFRAA 35*   < > 23* 21*  --  20*  --   ANIONGAP 11   < > 12 13  --  13  --    < > = values in this interval not displayed.     Hematology Recent Labs  Lab 04/22/19 0202  04/23/19 0413 04/24/19 0543 04/24/19 1448 04/24/19 2241 04/25/19 0556  WBC 12.1*  --  15.4* 12.7*  --   --   --   RBC 2.69*  --  3.07* 2.90*  --   --   --   HGB 6.9*   < > 7.9* 7.5* 9.2* 7.5* 7.1*  HCT 22.3*   < > 25.5* 23.9* 27.0* 22.0* 21.0*  MCV 82.9  --  83.1 82.4  --   --   --   MCH 25.7*  --  25.7* 25.9*  --   --   --   MCHC 30.9  --  31.0 31.4  --   --   --   RDW 18.1*  --  17.7* 18.1*  --   --   --   PLT 262  --  297 320  --   --   --    < > = values in this interval not displayed.    BNP Recent Labs  Lab 04/24/19 0741  BNP 1,044.4*     Radiology    US Renal  Result Date: 04/24/2019 CLINICAL DATA:  Acute kidney injury. Peripheral vascular disease with gangrene. EXAM: RENAL / URINARY TRACT ULTRASOUND COMPLETE COMPARISON:  01/30/2012 renal ultrasound FINDINGS: Right Kidney: Renal measurements: 9.0 x 3.7 x 4.2 centimeters = volume: 92.9 mL . Echogenicity within normal limits. No mass or hydronephrosis visualized. Left Kidney: Renal measurements: 9.3 x 5.4 x 5.6 centimeters = volume: 148.7 mL. Echogenicity within normal limits.  No mass or hydronephrosis visualized. Bladder: Bladder is decompressed by a Foley catheter. Additional: Study quality is degraded by patient motion artifact. IMPRESSION: 1. No acute abnormality. 2. Kidneys measure slightly smaller compared to prior. Kidney now 9.0 centimeters and previously 10.3 and 11.2 centimeters. LEFT kidney now measures 9.3 centimeters, previously 10.8 and 10.5 centimeters. 3. No hydronephrosis. 4. Foley catheter. Electronically Signed   By: Nolon Nations M.D.   On: 04/24/2019 13:32   Dg Chest Port 1 View  Result Date: 04/25/2019 CLINICAL DATA:  BiPAP. EXAM:  PORTABLE CHEST 1 VIEW COMPARISON:  Chest radiograph 04/24/2019 FINDINGS: Monitoring leads overlie the patient. Stable cardiac and mediastinal contours. Similar diffuse bilateral areas of consolidation. Probable small bilateral pleural effusions. No definite pneumothorax. IMPRESSION: Similar diffuse bilateral consolidative opacities compatible with pneumonia. Small bilateral pleural effusions. Electronically Signed   By: Lovey Newcomer M.D.   On: 04/25/2019 09:17   Dg Chest Port 1 View  Result Date: 04/24/2019 CLINICAL DATA:  Respiratory distress, pneumonia EXAM: PORTABLE CHEST 1 VIEW COMPARISON:  04/22/2019 FINDINGS: Patchy bilateral airspace opacities throughout both lungs, right greater than left have increased slightly since prior study. Small left pleural effusion and moderate right pleural effusion, similar prior study. Heart is borderline in size. IMPRESSION: Patchy bilateral airspace opacities, right greater than left most compatible with pneumonia. Stable bilateral effusions also greater on the right. Electronically Signed   By: Rolm Baptise M.D.   On: 04/24/2019 11:36   Dg Swallowing Func-speech Pathology  Result Date: 04/23/2019 Objective Swallowing Evaluation: Type of Study: MBS-Modified Barium Swallow Study  Patient Details Name: Ashante Vanloo MRN: [id number] Date of Birth: 07/20/1961 Today's Date: 04/23/2019 Time: SLP Start Time (ACUTE ONLY): 30 -SLP Stop Time (ACUTE ONLY): 1400 SLP Time Calculation (min) (ACUTE ONLY): 20 min Past Medical History: Past Medical History: Diagnosis Date  Autism   BPH (benign prostatic hyperplasia)   Depression   Diabetes mellitus   Elective mutism   Gastroparesis   Hyperglycemia   Hyperosmolar (nonketotic) coma ([place])   Hyponatremia   Mental retardation   Mental retardation   Pneumonia   Renal disorder   Urinary retention   UTI (urinary tract infection)  Past Surgical History: Past Surgical History: Procedure Laterality Date  AMPUTATION Left 05/04/2019   Procedure: AMPUTATION ABOVE KNEE LEFT LEG;  Surgeon: Angelia Mould, MD;  Location: [facility];  Service: Vascular;  Laterality: Left; HPI: 58 y.o.male, SNF resident,with medical history significant forsevere autism with elective mutism, schizoaffective disorder, diabetes mellitus type 2, chronic kidney disease, chronic suprapubic catheter for urinary strictures, admitted with left heel gangrene.  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 MBS from June 2013 with moderate dysphagia after acute hospitalization with multiple intubations.   Pt noted by nursing to be coughing frequently with meals.   Subjective: pleasant, cooperative Assessment / Plan / Recommendation CHL IP CLINICAL IMPRESSIONS 04/23/2019 Clinical Impression Paient presents with a mild oral and a mild-moderate pharyngeal dysphagia. During oral phase, patient exhibited mastication delay with regular solids and delay in anterior to posterior transit of puree and regular solids boluses with piecemeal swallowing. Pharyngeal phase consisted of delays in swallow initiation to level of vallecular sinus with all tested boluses and flash penetration with thin liquids via cup or straw sip, which occured approximately 50% of the time, but with full clearance of penetrate.  SLP Visit Diagnosis Dysphagia, oropharyngeal phase (R13.12) Attention and concentration deficit following -- Frontal lobe and executive function deficit following -- Impact on safety and function Mild aspiration risk   CHL  IP TREATMENT RECOMMENDATION 04/23/2019 Treatment Recommendations Therapy as outlined in treatment plan below   Prognosis 04/23/2019 Prognosis for Safe Diet Advancement Good Barriers to Reach Goals Cognitive deficits;Severity of deficits Barriers/Prognosis Comment -- CHL IP DIET RECOMMENDATION 04/23/2019 SLP Diet Recommendations Regular solids;Thin liquid Liquid Administration via Cup;Straw Medication Administration Whole meds with liquid Compensations Minimize environmental distractions;Slow  rate;Small sips/bites Postural Changes --   CHL IP OTHER RECOMMENDATIONS 02/12/2012 Recommended Consults -- Oral Care Recommendations -- Other Recommendations Order thickener from pharmacy;Clarify dietary restrictions   CHL IP FOLLOW UP RECOMMENDATIONS 04/23/2019 Follow up Recommendations Other (comment)   CHL IP FREQUENCY AND DURATION 04/23/2019 Speech Therapy Frequency (ACUTE ONLY) min 2x/week Treatment Duration 1 week      CHL IP ORAL PHASE 04/23/2019 Oral Phase Impaired Oral - Pudding Teaspoon -- Oral - Pudding Cup -- Oral - Honey Teaspoon -- Oral - Honey Cup -- Oral - Nectar Teaspoon -- Oral - Nectar Cup -- Oral - Nectar Straw -- Oral - Thin Teaspoon NT Oral - Thin Cup WFL Oral - Thin Straw WFL Oral - Puree Reduced posterior propulsion;Delayed oral transit;Piecemeal swallowing Oral - Mech Soft -- Oral - Regular Piecemeal swallowing;Delayed oral transit;Impaired mastication Oral - Multi-Consistency -- Oral - Pill -- Oral Phase - Comment --  CHL IP PHARYNGEAL PHASE 04/23/2019 Pharyngeal Phase Impaired Pharyngeal- Pudding Teaspoon -- Pharyngeal -- Pharyngeal- Pudding Cup -- Pharyngeal -- Pharyngeal- Honey Teaspoon -- Pharyngeal -- Pharyngeal- Honey Cup -- Pharyngeal -- Pharyngeal- Nectar Teaspoon -- Pharyngeal -- Pharyngeal- Nectar Cup -- Pharyngeal -- Pharyngeal- Nectar Straw -- Pharyngeal -- Pharyngeal- Thin Teaspoon -- Pharyngeal -- Pharyngeal- Thin Cup Delayed swallow initiation-vallecula;Penetration/Aspiration during swallow Pharyngeal Material enters airway, remains ABOVE vocal cords then ejected out Pharyngeal- Thin Straw Penetration/Aspiration during swallow;Delayed swallow initiation-vallecula Pharyngeal Material enters airway, remains ABOVE vocal cords then ejected out Pharyngeal- Puree Delayed swallow initiation-vallecula Pharyngeal -- Pharyngeal- Mechanical Soft -- Pharyngeal -- Pharyngeal- Regular Delayed swallow initiation-vallecula Pharyngeal -- Pharyngeal- Multi-consistency -- Pharyngeal -- Pharyngeal-  Pill -- Pharyngeal -- Pharyngeal Comment --  CHL IP CERVICAL ESOPHAGEAL PHASE 04/23/2019 Cervical Esophageal Phase WFL Pudding Teaspoon -- Pudding Cup -- Honey Teaspoon -- Honey Cup -- Nectar Teaspoon -- Nectar Cup -- Nectar Straw -- Thin Teaspoon -- Thin Cup -- Thin Straw -- Puree -- Mechanical Soft -- Regular -- Multi-consistency -- Pill -- Cervical Esophageal Comment -- Dannial Monarch 04/23/2019, 4:32 PM  Sonia Baller, MA, CCC-SLP Speech Therapy [facility] Acute Rehab Pager: [phone]             Vas [place] Abi With/wo Tbi  Result Date: 04/23/2019 LOWER EXTREMITY DOPPLER STUDY Indications: Peripheral artery disease.  Vascular Interventions: Left AKA. Comparison Study: No previous study available for comparison Performing Technologist: Birdena Crandall, Oscar. James  Examination Guidelines: A complete evaluation includes at minimum, Doppler waveform signals and systolic blood pressure reading at the level of bilateral brachial, anterior tibial, and posterior tibial arteries, when vessel segments are accessible. Bilateral testing is considered an integral part of a complete examination. Photoelectric Plethysmograph (PPG) waveforms and toe systolic pressure readings are included as required and additional duplex testing as needed. Limited examinations for reoccurring indications may be performed as noted.  ABI Findings: +---------+------------------+-----+---------+----------------+  Right     Rt Pressure (mmHg) Index Waveform  Comment           +---------+------------------+-----+---------+----------------+  Brachial  155                      triphasic                   +---------+------------------+-----+---------+----------------+  PTA       255                1.65  biphasic  non compressible  +---------+------------------+-----+---------+----------------+  DP        255                1.65  triphasic non compressible  +---------+------------------+-----+---------+----------------+  Great Toe 51                 0.33                               +---------+------------------+-----+---------+----------------+ +---------+------------------+-----+---------+-------+  Left      Lt Pressure (mmHg) Index Waveform  Comment  +---------+------------------+-----+---------+-------+  Brachial  115                      triphasic          +---------+------------------+-----+---------+-------+  PTA                                          AKA      +---------+------------------+-----+---------+-------+  DP                                           AKA      +---------+------------------+-----+---------+-------+  Great Toe                                    AKA      +---------+------------------+-----+---------+-------+ +-------+-----------+-----------+------------+------------+  ABI/TBI Today's ABI Today's TBI Previous ABI Previous TBI  +-------+-----------+-----------+------------+------------+  Right   1.65        0.33        no previous  no previous   +-------+-----------+-----------+------------+------------+  Left    AKA                     no previous  no previous   +-------+-----------+-----------+------------+------------+ Arterial wall calcification precludes accurate ankle pressures and ABIs.  Summary: Right: Resting right ankle-brachial index indicates noncompressible right lower extremity arteries.The right toe-brachial index is abnormal. Left: Left Above the knee amputation.  *See table(s) above for measurements and observations.  Electronically signed by Servando Snare MD on 04/23/2019 at 1:25:35 PM.    Final    Vas [place] Lower Extremity Arterial Duplex  Result Date: 04/24/2019 LOWER EXTREMITY ARTERIAL DUPLEX STUDY Indications: Peripheral artery disease.  Current ABI: Non compressible Limitations: Positionimg of the patient and respiratory interference.              Overpowering venous signal Comparison       No right lower extremity arterial duplex available for Study:           comparison Performing Technologist: Toma Copier RVS   Examination Guidelines: A complete evaluation includes B-mode imaging, spectral Doppler, color Doppler, and power Doppler as needed of all accessible portions of each vessel. Bilateral testing is considered an integral part of a complete examination. Limited examinations for reoccurring indications may be performed as noted.  +----------+--------+-----+--------+---------+---------------------------------+  RIGHT      PSV cm/s Ratio Stenosis Waveform  Comments                           +----------+--------+-----+--------+---------+---------------------------------+  CFA Prox   93                      triphasic mild plaque                        +----------+--------+-----+--------+---------+---------------------------------+  DFA        107                     triphasic mild plaque                        +----------+--------+-----+--------+---------+---------------------------------+  SFA Prox   120                     triphasic mild plaque                        +----------+--------+-----+--------+---------+---------------------------------+  SFA Mid    92                      triphasic mild plaque                        +----------+--------+-----+--------+---------+---------------------------------+  SFA Distal 100                     triphasic mild plaque                        +----------+--------+-----+--------+---------+---------------------------------+  POP Prox   78                      triphasic mild plaque                        +----------+--------+-----+--------+---------+---------------------------------+  POP Distal 79                      biphasic  mild plaque                        +----------+--------+-----+--------+---------+---------------------------------+  ATA Distal 37                                                                   +----------+--------+-----+--------+---------+---------------------------------+  PTA Prox   50                                Difficult to evaluate waveforms                                                   due to strong venous signal                                                      moderate plaque                    +----------+--------+-----+--------+---------+---------------------------------+  PTA Mid    29                                mild plaque                        +----------+--------+-----+--------+---------+---------------------------------+  PTA Distal 66                                                                   +----------+--------+-----+--------+---------+---------------------------------+  Summary: Right: ABIs are unreliable. There is no obvious evidence of significant stenosis. However there is calcification of the arteries worsening moving distally from the thigh to the ankle.  See table(s) above for measurements and observations. Electronically signed by Monica Martinez MD on 04/24/2019 at 4:13:19 PM.    Final     Cardiac Studies   Echocardiogram 04/23/2019: 1. The left ventricle has normal systolic function, with an ejection fraction of 60-65%. The cavity size was normal.Technically difficult study due to limited windows. Cannot assess for focal wall motion abnormalities.  2. The right ventricle has normal systolc function. The cavity was normal. There is no increase in right ventricular wall thickness. Right ventricular systolic pressure could not be assessed.  3. The aortic valve was not well visualized.  4. The mitral valve is grossly normal.  5. The Pulmonary valve was not visualized.iogram 04/23/2019:   Patient Profile     58 y.o. male with a history of severe autism with selective mutism, schizophrenic disorder, diabetes type 2, chronic kidney disease, and chronic suprapubic catheter for urinary strictures.  He is now being managed for acute hypoxic respiratory failure, sepsis, and acute on chronic kidney injury.  Cardiology is consulted secondary to PSVT versus PAF.  Assessment & Plan    PSVT, fairly frequent episodes  and as fast as the 180s to 190s.  These are largely regular and do not look consistent with atrial fibrillation, although there are short bursts that are irregular and could potentially be brief runs of atrial fibrillation.  He has been placed on IV amiodarone which is reasonable.  Plans underway for mechanical ventilation per CCM as well for further stabilization.  He is being followed by Nephrology.  Unlikely that he would tolerate beta-blocker or calcium channel blocker at this time due to low blood pressures.  Signed, Rozann Lesches, MD  04/25/2019, 9:51 AM

## 2019-04-25 NOTE — Progress Notes (Addendum)
[facility] Progress Note  Name: [name] MRN: [id number] DOB: 1960-09-09  Chief Complaint:  Left heel gangrene  Subjective:  Tachycardia overnight - up to 190 not sustained per nursing.  Cardiology was contacted.  Mag and K repleted.  Bicarb gtt was reduced to 50 cc/hr.  Received lasix 40 mg yesterday evening/afternoon and got another 20 mg IV early this AM as well.  Patient had 2.3 liters UOP over 8/21.  He has remained on bipap.   Review of systems:  Unable to obtain secondary to patient factors - selectively mute    Intake/Output Summary (Last 24 hours) at 04/25/2019 0521 Last data filed at 04/25/2019 0400 Gross per 24 hour  Intake 1369.7 ml  Output 2250 ml  Net -880.3 ml    Vitals:  Vitals:   04/25/19 0100 04/25/19 0200 04/25/19 0247 04/25/19 0338  BP: 107/67 (!) 96/48    Pulse: (!) 108 (!) 158 94   Resp: 18 (!) 26 (!) 23   Temp: 99.8 F (37.7 C)   100 F (37.8 C)  TempSrc: Axillary   Axillary  SpO2: 96% 98% 97%   Weight:      Height:         Physical Exam:  General adult male in bed on bipap HEENT normocephalic atraumatic  Neck supple trachea midline Lungs rhonchi bilaterally  Heart tachycardia no rub  Abdomen soft nontender nondistended Extremities 1+ edema  GU - suprapubic catheter  Neuro - selectively mute per report; tracks and will shake head intermittently; shook head yes that he is cold   Medications reviewed   Labs:  BMP Latest Ref Rng & Units 04/25/2019 04/24/2019 04/24/2019  Glucose 70 - 99 mg/dL 186(H) - 148(H)  BUN 6 - 20 mg/dL 49(H) - 47(H)  Creatinine 0.61 - 1.24 mg/dL 3.60(H) - 3.47(H)  Sodium 135 - 145 mmol/L 142 144 142  Potassium 3.5 - 5.1 mmol/L 3.8 3.7 3.7  Chloride 98 - 111 mmol/L 113(H) - 115(H)  CO2 22 - 32 mmol/L 16(L) - 14(L)  Calcium 8.9 - 10.3 mg/dL 8.0(L) - 8.0(L)     Assessment/Plan:  Patient with autism and under guardianship presented with left heel gangrene and ultimately s/p AKA.  He has received  IV antibiotics including vancomycin.  Transferred to the unit today for resp distress requiring bipap.  Also noted with AKI.  Does have CKD at baseline it appears per labs and imaging.  Was referred to Dr. Hinda Lenis for anemia management per his guardian; she thought he had CKD stage II.  He was started on a bicarb gtt earlier this afternoon as well.  Renal ultrasound was without hydro but kidneys noted to be smaller than prior exam in 2013.  Urine culture on antibiotics grew yeast.    # AKI   - nonoliguric.  S/p multifactorial pre-renal and ischemic insults in the setting of sepsis and mild hypotension  - continue supportive care - no acute indication for dialysis; monitoring closely  - hopeful for plateau    - repeat BMP at 1400; K 20 meq ordered   # Metabolic acidosis - agree with reduced rate bicarb gtt - repeat 1 amp bicarb now   # CKD stage III  - note 02/2012 with Cr 1.63 and 02/24/19 with Cr 2.6, eGFR 30 - baseline Cr appears to be in the 2's recently.  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 Hinda Lenis as an outpatient who had been managing anemia per report  - s/p PRBC's - some component may be due to renal failure as well   [name], MD 04/25/2019 5:21 AM

## 2019-04-25 NOTE — Progress Notes (Signed)
SLP Cancellation Note  Patient Details Name: [name] MRN: [id number] DOB: 1961/04/28   Cancelled treatment:       Reason Eval/Treat Not Completed: Patient not medically ready. Pt on BiPAP. Will follow for readiness.    Shalev Helminiak, Katherene Ponto 04/25/2019, 10:17 AM

## 2019-04-25 NOTE — Procedures (Signed)
Intubation Procedure Note [name] [id number] August 03, 1961  Procedure: Intubation Indications: Respiratory insufficiency  Procedure Details Consent: Risks of procedure as well as the alternatives and risks of each were explained to the (patient/caregiver).  Consent for procedure obtained. consent from [employer].  Time Out: Verified patient identification, verified procedure, site/side was marked, verified correct patient position, special equipment/implants available, medications/allergies/relevent history reviewed, required imaging and test results available.  Performed  Maximum sterile technique was used including cap, gloves, hand hygiene and mask.  MAC    Evaluation Hemodynamic Status: BP stable throughout; O2 sats: transiently fell during during procedure improved after recruitment maneuvers (dropped to 80s for about 10 min) Patient's Current Condition: stable Complications: No apparent complications Patient did tolerate procedure well. Chest X-ray ordered to verify placement.  CXR: tube position acceptable.   [name] 04/25/2019

## 2019-04-25 NOTE — Progress Notes (Signed)
NAME:  [name], MRN:  [id number], DOB:  [birth year], LOS: 58 ADMISSION DATE:  04/27/2019, CONSULTATION DATE:  04/25/19 REFERRING MD: Dr. Horris Latino , CHIEF COMPLAINT:   Acute hypoxic respiratory failure  Brief History   58 y/o M admitted 8/7 for gangrenous, non-healing L foot ulcer, eventually underwent L AKA, developed likely aspiration PNA.  PCCM consulted for worsening respiratory status requiring Bipap.  History of present illness   58 y.o. M with PMH of severe autism and elective mutism, anemia on iron infusions outpatient, Type 2 DM, CKD, Schizoaffective disorder, and suprapubic catheter for urinary strictures, wheelchair bound who was admitted on 04/11/2019 for non-healing L heel ulcer.  This initially began in June 2020 per notes and  was previously treated outpatient and underwent surgical debridement on 6/25.   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 Scot Dock.  He has been treated with IV abx, noted to have some fevers and chills over the last 24hrs and CXR showed possible R opacity. Seen by ID, concern for possible aspiration and was continued on Cefepime and Flagyl.  He has also been anemic since admission, received 2 units PRBC's on 8/7 and one unit on 8/19.   Today, pt noted to be febrile to 101.9 and was having worsening respiratory distress and required Bipap.  He was not hypoxic by pulse ox, but  ABG concerning for PO2 of 40, so PCCM was consulted.  Past Medical History  Schizoaffective Disorder, severe autism and selective mutism, CKD, indwelling suprapubic catheter  Significant Hospital Events   04/15/2019 Admitted to hospitalists 8/18 L AKA 8/21 Respiratory distress, txfr to ICU  Consults:  8/7 Vascular Surgery  8/20 Infectious Disease 8/21 PCCM  Procedures:  8/18 L AKA  Significant Diagnostic Tests:  8/20 Echocardiogram>>EF 60-65%, unable to assess RV systolic pressure, but normal RV systolic function [id number] CXR>>patchy bilateral  infiltrates increased since CXR on 8/19 8/21 Renal US>>   Micro Data:  8/7 Sars-CoV-2>>neg 8/12 BCx2>>negative  8/18 Sars-CoV-2>>negative 8/18 BCx2>> 8/19 UC>>70,000 colonies yeast 8/19 MRSA>>neg  Antimicrobials:  Zosyn 8/7>8/13 Ceftriaxone 8/14>8/18 Vancomycin 8/7>8/11, 8/18> (I dont see any vanc was given on  Flagyl 8/15>8/20 Cefepime 8/18>8/20   Interim history/subjective:  Pt with increasing tachypnea and respiratory distress, initiated on Bipap and transferred to the ICU.  Currently stable on Bipap 12/6  Overnight fever 102 7.29/29/56  Objective   Blood pressure (!) 92/59, pulse 91, temperature 100 F (37.8 C), temperature source Axillary, resp. rate 18, height 5\' 10"  (1.778 m), weight 81.6 kg, SpO2 97 %. on Bipap 12/6    FiO2 (%):  [70 %-90 %] 90 %   Intake/Output Summary (Last 24 hours) at 04/25/2019 0803 Last data filed at 04/25/2019 0600 Gross per 24 hour  Intake 1554.07 ml  Output 2350 ml  Net -795.93 ml   Filed Weights   04/20/19 0627 04/23/19 0456 04/24/19 0433  Weight: 81.6 kg 85.7 kg 81.6 kg    General:   chronically ill-appearing M in no acute distress HEENT: MM pink/moist Neuro: opens eyes to command, not otherwise interactive CV: s1s2, regular rate and rhythm, no m/r/g PULM:  Tachypneic, not in severe respiratory distress and no accessory muscle use, basilar rhonchi bilaterally GI: soft, bsx4 active  Extremities: s.p L AKA warm/dry, no edema  Skin: no rashes or lesions   Resolved Hospital Problem list   L heel ulcer  Assessment & Plan:    Acute hypoxic respiratory failure likely secondary to volume overload  in combination with aspiration pneumonia  -Pneumonia likely secondary to aspiration, ID following, though febrile, leukocytosis improving -BC have been negative thus far -Echo with EF of 60-65 %, worsening renal function, IVF and transfusion likely contributing -BNP >1,000 P: -Continue Cefepime and Flagyl per ID recs -Lasix 40mg   bid, monitor I&O -Still tachypneic, requiring high O2, sat in mid 90s . Rhonchorus on L chest, appears weak.  Will likley need to intubate.  Likely PNA component drivign respiratory failure.  Has diuresed, CXR looks a little better, but clinically not improving.   Speech eval from 8/20, after MBS:   SLP Diet Recommendations: Regular solids;Thin liquid  Liquid Administration via: Cup;Straw  Medication Administration: Whole meds with liquid  Supervision: Staff to assist with self feeding;Full supervision/cueing for compensatory strategies  Compensations: Minimize environmental distractions;Slow rate;Small sips/bites   Atrial Fibrillation Getting xopenex - will d/c given afib hypomag treated, K treated.  Echo done yesterday showed EF 60-65%, study was technically difficult  due to limited windows. Unable to assess for wall motion abnormalities.  BNP >1000 Starting amio  Acute on chronic renal failure II and metabolic acidosis -Acidosis possibly exacerabated hyperchloremia in the setting of IVF and renal failure Baseline creatinine 1.6-2.0 P: -Bicarb gtt, repeat BMP later today -Appreciate nephrology recommendations and renal US is pending  Bicarb reduced to 50mg /hr Lasix 40mg  am, additional 20 given at midnight.  Hypotension:  Sepsis 2/2 PNA likely.  Starting neosynephrine.   Metabolic acidosis:  Co2 up to 16 today from 14 yesterday.  BUN increased to 49 today  Cr up to 3.6 from 3.47  Anemia:  Worsening drifting down  7.1/21   Type 2 DM -continue [place] and SSI  Urinary strictures , suprapubic cath    Schizoaffective Disorder, Autism, selective mutism -Continue prn Geodon and Ativan  HTN  BPH  Best practice:  Diet: NPO Pain/Anxiety/Delirium protocol (if indicated): Geodon VAP protocol (if indicated): n/a DVT prophylaxis: heparin GI prophylaxis: n/a Glucose control: SSI Mobility: bed rest Code Status: fever Family Communication:  Disposition:  ICU   Labs   CBC: Recent Labs  Lab 04/20/19 0707 04/27/2019 0349 04/24/2019 2234 04/22/19 0202  04/23/19 0413 04/24/19 0543 04/24/19 1448 04/24/19 2241 04/25/19 0556  WBC 11.0* 9.5 12.3* 12.1*  --  15.4* 12.7*  --   --   --   NEUTROABS 7.2 6.1 9.4*  --   --  11.8* 11.0*  --   --   --   HGB 8.9* 8.9* 7.0* 6.9*   < > 7.9* 7.5* 9.2* 7.5* 7.1*  HCT 29.3* 28.9* 22.9* 22.3*   < > 25.5* 23.9* 27.0* 22.0* 21.0*  MCV 83.7 84.3 84.2 82.9  --  83.1 82.4  --   --   --   PLT 326 316 284 262  --  297 320  --   --   --    < > = values in this interval not displayed.    Basic Metabolic Panel: Recent Labs  Lab 04/20/19 0707 04/12/2019 0349  04/22/19 0202 04/23/19 0413 04/24/19 0543  04/24/19 1531 04/24/19 1820 04/24/19 2241 04/25/19 0018 04/25/19 0556  NA 140 136   < > 138 137 138   < > 139 142 144 142 145  K 3.7 4.2   < > 3.7 4.0 4.0   < > 6.0* 3.7 3.7 3.8 4.0  CL 110 110   < > 113* 114* 114*  --  114* 115*  --  113*  --   CO2 18*  16*   < > 18* 11* 12*  --  13* 14*  --  16*  --   GLUCOSE 192* 497*   < > 248* 266* 164*  --  215* 148*  --  186*  --   BUN 32* 31*   < > 28* 34* 40*  --  49* 47*  --  49*  --   CREATININE 2.03* 2.15*   < > 2.11* 2.50* 2.94*  --  3.33* 3.47*  --  3.60*  --   CALCIUM 7.9* 7.7*   < > 7.4* 7.8* 7.8*  --  7.8* 8.0*  --  8.0*  --   MG 1.7 1.6*  --  1.5* 1.7  --   --   --   --   --  1.8  --   PHOS  --   --   --   --   --   --   --   --   --   --  3.6  --    < > = values in this interval not displayed.   GFR: Estimated Creatinine Clearance: 23.4 mL/min (A) (by C-G formula based on SCr of 3.6 mg/dL (H)). Recent Labs  Lab 04/29/2019 2234 04/22/19 0202 04/23/19 0413 04/24/19 0543  WBC 12.3* 12.1* 15.4* 12.7*  LATICACIDVEN 3.0* 0.9  --   --     Liver Function Tests: Recent Labs  Lab 04/19/19 0403  AST 93*  ALT 41  ALKPHOS 108  BILITOT 0.4  PROT 6.1*  ALBUMIN 1.8*   No results for input(s): LIPASE, AMYLASE in the last 168 hours. No results for input(s):  AMMONIA in the last 168 hours.  ABG    Component Value Date/Time   PHART 7.291 (L) 04/25/2019 0556   PCO2ART 29.1 (L) 04/25/2019 0556   PO2ART 56.0 (L) 04/25/2019 0556   HCO3 13.9 (L) 04/25/2019 0556   TCO2 15 (L) 04/25/2019 0556   ACIDBASEDEF 11.0 (H) 04/25/2019 0556   O2SAT 84.0 04/25/2019 0556     Coagulation Profile: No results for input(s): INR, PROTIME in the last 168 hours.  Cardiac Enzymes: No results for input(s): CKTOTAL, CKMB, CKMBINDEX, TROPONINI in the last 168 hours.  HbA1C: Hgb A1c MFr Bld  Date/Time Value Ref Range Status  04/11/2019 04:09 AM 6.7 (H) 4.8 - 5.6 % Final    Comment:    (NOTE) Pre diabetes:          5.7%-6.4% Diabetes:              >6.4% Glycemic control for   <7.0% adults with diabetes   10/22/2011 09:12 AM 8.0 (H) <5.7 % Final    Comment:    (NOTE)                                                                       According to the ADA Clinical Practice Recommendations for 2011, when HbA1c is used as a screening test:  >=6.5%   Diagnostic of Diabetes Mellitus           (if abnormal result is confirmed) 5.7-6.4%   Increased risk of developing Diabetes Mellitus References:Diagnosis and Classification of Diabetes Mellitus,Diabetes [id number] 1):S62-S69 and Standards of Medical Care in  Diabetes - 2011,Diabetes Care,2011,34 (Suppl 1):S11-S61.    CBG: Recent Labs  Lab 04/24/19 1137 04/24/19 1525 04/24/19 1952 04/24/19 2321 04/25/19 0337  GLUCAP 169* 195* 146* 160* 185*    Review of Systems:   Review of Systems - unable to obtain secondary to mental status   Past Medical History  He,  has a past medical history of Autism, BPH (benign prostatic hyperplasia), Depression, Diabetes mellitus, Elective mutism, Gastroparesis, Hyperglycemia, Hyperosmolar (nonketotic) coma ([place]), Hyponatremia, Mental retardation, Mental retardation, Pneumonia, Renal disorder, Urinary retention, and UTI (urinary tract infection).    Surgical History    Past Surgical History:  Procedure Laterality Date  . AMPUTATION Left 04/23/2019   Procedure: AMPUTATION ABOVE KNEE LEFT LEG;  Surgeon: Angelia Mould, MD;  Location: [facility] OR;  Service: Vascular;  Laterality: Left;     Social History   reports that he has never smoked. He has never used smokeless tobacco. He reports that he does not drink alcohol or use drugs.   Family History   His family history is not on file.   Allergies No Known Allergies   Home Medications  Prior to Admission medications   Medication Sig Start Date End Date Taking? Authorizing Provider  acetaminophen (TYLENOL) 325 MG tablet Take 650 mg by mouth every 4 (four) hours as needed for fever (pain).    Yes [provider]  amLODipine (NORVASC) 10 MG tablet Take 10 mg by mouth daily.   Yes [provider]  ertapenem (INVANZ) 1 g injection Inject 1 g into the muscle daily.   Yes [provider]  famotidine (PEPCID) 10 MG tablet Take 20 mg by mouth daily.    Yes [provider]  hydrALAZINE (APRESOLINE) 25 MG tablet Take 25 mg by mouth 3 (three) times daily.   Yes [provider]  HYDROcodone-acetaminophen (NORCO/VICODIN) 5-325 MG tablet Take 1 tablet by mouth every 6 (six) hours as needed (pain).    Yes [provider]  insulin detemir (LEVEMIR) 100 unit/ml SOLN Inject 10 Units into the skin daily.   Yes [provider]  insulin NPH-regular Human (70-30) 100 UNIT/ML injection Inject 15 Units into the skin daily with breakfast. Hold if blood sugar < 150   Yes [provider]  insulin regular (NOVOLIN R) 100 units/mL injection Inject 10 Units into the skin See admin instructions. Give 10 units as needed for blood sugars > 400   Yes [provider]  levETIRAcetam (KEPPRA) 250 MG tablet Take 250 mg by mouth 2 (two) times daily.   Yes [provider]  mirtazapine (REMERON) 15 MG tablet Take 15 mg by mouth at  bedtime.    Yes [provider]  Multiple Vitamins-Minerals (MULTIVITAMIN WITH MINERALS) tablet Take 1 tablet by mouth daily.   Yes [provider]  Nutritional Supplements (PROMOD) LIQD Take 30 mLs by mouth 2 (two) times daily.   Yes [provider]  pioglitazone (ACTOS) 45 MG tablet Take 45 mg by mouth daily.   Yes [provider]  sertraline (ZOLOFT) 100 MG tablet Take 200 mg by mouth daily.    Yes [provider]  vitamin C (ASCORBIC ACID) 500 MG tablet Take 500 mg by mouth 2 (two) times daily.   Yes [provider]  ziprasidone (GEODON) 20 MG capsule Take 20 mg by mouth daily.    Yes [provider]  ziprasidone (GEODON) 40 MG capsule Take 40 mg by mouth at bedtime.   Yes [provider]  [name], MD [place] [facility]

## 2019-04-25 NOTE — Procedures (Signed)
Central Venous Catheter Insertion Procedure Note [name] [id number] 1960-10-31  Procedure: Insertion of Central Venous Catheter Indications: Drug and/or fluid administration  Procedure Details Consent: Risks of procedure as well as the alternatives and risks of each were explained to the (patient/caregiver).  Consent for procedure obtained. Time Out: Verified patient identification, verified procedure, site/side was marked, verified correct patient position, special equipment/implants available, medications/allergies/relevent history reviewed, required imaging and test results available.  Performed  Maximum sterile technique was used including antiseptics, cap, gloves, gown, hand hygiene, mask and sheet. Skin prep: Chlorhexidine; local anesthetic administered A antimicrobial bonded/coated triple lumen catheter was placed in the right internal jugular vein using the Seldinger technique.  Evaluation Blood flow good Complications: No apparent complications Patient did tolerate procedure well. Chest X-ray ordered to verify placement.  CXR: pending.  [name] 04/25/2019, 8:00 PM

## 2019-04-25 NOTE — Progress Notes (Signed)
Patient ID: [name], male   DOB: [birth year], 58 y.o.   MRN: [id number]  PROGRESS NOTE    [name]  [id number] DOB: August 30, 1961 DOA: 04/25/2019 PCP: Rosita Fire, MD   Brief Narrative:  58 y.o.malewith medical history significant ofsevere autism with elective mutism, schizoaffective disorder, diabetes mellitus type 2, chronic kidney disease, chronic suprapubic catheter for urinary strictures, who presented for CT angiogram of the left leg with Dr. Scot Dock for nonhealing ulcer. However, patient was found to have hemoglobin 7.8 for which the procedure was canceled.  He underwent surgical debridement on February 26, 2019 and was noted to have dropping hemoglobin.  Patient was recently found to have dropping hemoglobin for which Dr. Befekadu/nephrology had started iron infusions.  On admission, vascular surgery was consulted and patient was started on intravenous antibiotics.  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  UC grew yeast, ID on board, no further recs ID consulted, appreciate recs, fever likely due to postop pneumonia, continue cefepime, metronidazole.  Discontinue vancomycin Continue cefepime, metronidazole Started on pressors  Acute hypoxic respiratory failure likely 2/2 HCAP Currently placed on bipap noted hypoxia, increased work of breathing ABG showed hypoxia, metabolic acidosis Chest x-ray with bilateral opacity concerning for edema versus pneumonia Duo nebs,  incentive spirometry, supplemental oxygen prn PCCM on board, appreciate recs Remain in ICU for closer observation, may possibly need intubation  Acute kidney injury superimposed on chronic kidney disease stage III Worsening Now with metabolic acidosis History of chronic suprapubic catheter Unclear baseline, last creatinine in 2013, ??new baseline Nephrology consulted Continue bicarb drip in the ICU Daily BMP, monitor closely   ??SVT/New onset A. fib/volume overload Heart rate noted to be in the 100s BNP >1000 Repeat EKG, with sinus tachycardia Chest x-ray as above Echo done was technically difficult due to poor windows/positioning, noted to have EF of 60 to 65% Status post IV Lasix Started on amiodarone drip Cardiology on board  Iron deficiency anemia/anemia of chronic disease/folate deficiency Acute drop in hemoglobin No overt signs of GI bleeding.  FOBT negative.  GI recommended outpatient GI evaluation and follow-up Status post 3 units packed red cells transfusion since admission, last on 04/22/2019  Status post IV Feraheme on 04/11/2019  Folate levels low at 3.8.  Continue supplementation. Vitamin B12 level at 1214 Continue PPI  Hypomagnesemia Replace PRN Daily magnesium  Hypertension Currently hypotensive, on pressors Hold amlodipine and hydralazine.  Diabetes mellitus type 2  A1c 6.7.  Continue Levemir, NovoLog Continue CBGs with SSI, hypoglycemic protocol  Hypoalbuminemia Follow nutrition recommendations.  Schizoaffective disorder  Stable Continue Zoloft, Geodon, Remeron  Autism/intellectual disability Patient autistic at baseline and electively mute.  Patient nods his head or shakes his head to answer yes or no.  History of seizure disorder No seizures noted Continue Keppra.   DVT prophylaxis: Heparin Code Status: Full Family Communication: None at bedside Disposition Plan: Back to SNF  Consultants:  Vascular  surgery GI ID PCCM Nephrology Cardiology  Procedures: Left AKA  Antimicrobials:  Currently on cefepime, metronidazole Vancomycin and Zosyn from 04/29/2019-05/02/2019 Rocephin from 04/17/2019 onwards Flagyl  from 04/18/2019 onwards   Subjective: Patient seen and examined at bedside.  Currently in ICU, on BiPAP.  Not able to answer questions.     Objective: Vitals:   04/25/19 1430 04/25/19 1445 04/25/19 1500 04/25/19 1515  BP: (!) 98/59 (!) 89/73  (!) 94/35  Pulse: 93 93 94   Resp: (!) 23 19 18  (!) 22  Temp:      TempSrc:      SpO2: 96% 94% 95%   Weight:      Height:        Intake/Output Summary (Last 24 hours) at 04/25/2019 1612 Last data filed at 04/25/2019 1300 Gross per 24 hour  Intake 1909.77 ml  Output 1410 ml  Net 499.77 ml   Filed Weights   04/20/19 0627 04/23/19 0456 04/24/19 0433  Weight: 81.6 kg 85.7 kg 81.6 kg    Examination:  General:  In moderate distress  Cardiovascular: S1, S2 present  Respiratory:  Bilateral rhonchi noted  Abdomen: Soft, nontender, nondistended, bowel sounds present  Musculoskeletal: Left lower extremity stump, clean/dry/intact, no pedal edema noted  Skin: Normal  Psychiatry:  Unable to assess    Data Reviewed: I have personally reviewed following labs and imaging studies  CBC: Recent Labs  Lab 04/20/19 0707 04/04/2019 0349 04/19/2019 2234 04/22/19 0202  04/23/19 0413 04/24/19 0543 04/24/19 1448 04/24/19 2241 04/25/19 0556  WBC 11.0* 9.5 12.3* 12.1*  --  15.4* 12.7*  --   --   --   NEUTROABS 7.2 6.1 9.4*  --   --  11.8* 11.0*  --   --   --   HGB 8.9* 8.9* 7.0* 6.9*   < > 7.9* 7.5* 9.2* 7.5* 7.1*  HCT 29.3* 28.9* 22.9* 22.3*   < > 25.5* 23.9* 27.0* 22.0* 21.0*  MCV 83.7 84.3 84.2 82.9  --  83.1 82.4  --   --   --   PLT 326 316 284 262  --  297 320  --   --   --    < > = values in this interval not displayed.   Basic Metabolic Panel: Recent Labs  Lab 04/20/19 0707 04/24/2019 0349  04/22/19 0202 04/23/19 0413  04/24/19 0543  04/24/19 1531 04/24/19 1820 04/24/19 2241 04/25/19 0018 04/25/19 0556  NA 140 136   < > 138 137 138   < > 139 142 144 142 145  K 3.7 4.2   < > 3.7 4.0 4.0   < > 6.0* 3.7 3.7 3.8 4.0  CL 110 110   < > 113* 114* 114*  --  114* 115*  --  113*  --   CO2 18* 16*   < > 18* 11* 12*  --  13* 14*  --  16*  --   GLUCOSE 192* 497*   < > 248* 266* 164*  --  215* 148*  --  186*  --   BUN 32* 31*   < > 28* 34* 40*  --  49* 47*  --  49*  --   CREATININE 2.03* 2.15*   < > 2.11* 2.50* 2.94*  --  3.33* 3.47*  --  3.60*  --   CALCIUM 7.9* 7.7*   < > 7.4* 7.8* 7.8*  --  7.8* 8.0*  --  8.0*  --   MG 1.7 1.6*  --  1.5* 1.7  --   --   --   --   --  1.8  --   PHOS  --   --   --   --   --   --   --   --   --   --  3.6  --    < > = values in this interval not displayed.   GFR: Estimated Creatinine Clearance: 23.4 mL/min (A) (by C-G formula based on SCr of 3.6 mg/dL (H)). Liver Function Tests: Recent Labs  Lab 04/19/19 0403  AST 93*  ALT 41  ALKPHOS 108  BILITOT 0.4  PROT 6.1*  ALBUMIN 1.8*   No results for input(s): LIPASE, AMYLASE in the last 168 hours. No results for input(s): AMMONIA in the last 168 hours. Coagulation Profile: No results for input(s): INR, PROTIME in the last 168 hours. Cardiac Enzymes: No results for input(s): CKTOTAL, CKMB, CKMBINDEX, TROPONINI in the last 168 hours. BNP (last 3 results) No results for input(s): PROBNP in the last 8760 hours. HbA1C: No results for input(s): HGBA1C in the last 72 hours. CBG: Recent Labs  Lab 04/24/19 1952 04/24/19 2321 04/25/19 0337 04/25/19 0826 04/25/19 1217  GLUCAP 146* 160* 185* 199* 127*   Lipid Profile: No results for input(s): CHOL, HDL, LDLCALC, TRIG, CHOLHDL, LDLDIRECT in the last 72 hours. Thyroid Function Tests: Recent Labs    04/24/19 0741  TSH 1.313  FREET4 0.93   Anemia Panel: No results for input(s): VITAMINB12, FOLATE, FERRITIN, TIBC, IRON, RETICCTPCT in the last 72 hours. Sepsis Labs: Recent  Labs  Lab 04/19/2019 2234 04/22/19 0202  LATICACIDVEN 3.0* 0.9    Recent Results (from the past 240 hour(s))  Culture, blood (routine x 2)     Status: None (Preliminary result)   Collection Time: 04/10/2019 10:18 PM   Specimen: BLOOD LEFT HAND  Result Value Ref Range Status   Specimen Description BLOOD LEFT HAND  Final   Special Requests   Final    BOTTLES DRAWN AEROBIC AND ANAEROBIC Blood Culture adequate volume   Culture   Final    NO GROWTH 4 DAYS Performed at [facility], [place] [address], [place], [place] [address]    Report Status PENDING  Incomplete  Culture, blood (routine x 2)     Status: None (Preliminary result)   Collection Time: 04/08/2019 10:25 PM   Specimen: BLOOD RIGHT HAND  Result Value Ref Range Status   Specimen Description BLOOD RIGHT HAND  Final   Special Requests   Final    BOTTLES DRAWN AEROBIC AND ANAEROBIC Blood Culture adequate volume   Culture   Final    NO GROWTH 4 DAYS Performed at [facility], [place] [address] [place], [place] [address]    Report Status PENDING  Incomplete  Culture, Urine     Status: Abnormal   Collection Time: 04/22/19  8:46 AM   Specimen: Urine, Suprapubic  Result Value Ref Range Status   Specimen Description URINE, SUPRAPUBIC  Final   Special Requests   Final    NONE Performed at [facility], [place] [address], [place],  [address]    Culture 70,000 COLONIES/mL YEAST (A)  Final   Report Status 04/23/2019 FINAL  Final  MRSA PCR Screening     Status: None   Collection Time: 04/22/19  3:56 PM   Specimen: Nasal Mucosa; Nasopharyngeal  Result Value Ref Range Status   MRSA by PCR NEGATIVE NEGATIVE Final    Comment:        The GeneXpert MRSA Assay (FDA approved for NASAL specimens only), is one component of a comprehensive MRSA colonization surveillance program. It is not intended to diagnose MRSA infection nor to guide or monitor treatment for MRSA infections. Performed at [facility],  [address] [place], [place] [address]   Culture, blood (routine x 2)     Status: None (Preliminary result)   Collection Time: 04/24/19  1:35 PM   Specimen: BLOOD  Result Value Ref Range Status   Specimen Description BLOOD RIGHT ANTECUBITAL  Final   Special Requests   Final    BOTTLES DRAWN AEROBIC ONLY Blood Culture adequate volume   Culture   Final    NO GROWTH < 24 HOURS Performed at [facility], [place] [address] [place], [place] [address]    Report Status PENDING  Incomplete  Culture, blood (routine x 2)     Status: None (Preliminary result)   Collection Time: 04/24/19  1:35 PM   Specimen: BLOOD RIGHT HAND  Result Value Ref Range Status   Specimen Description BLOOD RIGHT HAND  Final   Special Requests   Final    BOTTLES DRAWN AEROBIC ONLY Blood Culture results may not be optimal due to an inadequate volume of blood received in culture bottles   Culture   Final    NO GROWTH < 24 HOURS Performed at [facility], [place] [address], [place], [place] [address]    Report Status PENDING  Incomplete         Radiology Studies: US Renal  Result Date: 04/24/2019 CLINICAL DATA:  Acute kidney injury. Peripheral vascular disease with gangrene. EXAM: RENAL / URINARY TRACT ULTRASOUND COMPLETE COMPARISON:  01/30/2012 renal ultrasound FINDINGS: Right Kidney: Renal measurements: 9.0 x 3.7 x 4.2 centimeters = volume: 92.9 mL . Echogenicity within normal limits. No mass or hydronephrosis visualized. Left Kidney: Renal measurements: 9.3 x 5.4 x 5.6 centimeters = volume: 148.7 mL. Echogenicity within normal limits. No mass or hydronephrosis visualized. Bladder: Bladder is decompressed by a Foley catheter. Additional: Study quality is degraded by patient motion artifact. IMPRESSION: 1. No acute abnormality. 2. Kidneys measure slightly smaller compared to prior. Kidney now 9.0 centimeters and previously 10.3 and 11.2 centimeters. LEFT kidney now measures 9.3 centimeters, previously 10.8 and  10.5 centimeters. 3. No hydronephrosis. 4. Foley catheter. Electronically Signed   By: Nolon Nations M.D.   On: 04/24/2019 13:32   Dg Chest Port 1 View  Result Date: 04/25/2019 CLINICAL DATA:  BiPAP. EXAM: PORTABLE CHEST 1 VIEW COMPARISON:  Chest radiograph 04/24/2019 FINDINGS: Monitoring leads overlie the patient. Stable cardiac and mediastinal contours. Similar diffuse bilateral areas of consolidation. Probable small bilateral pleural effusions. No definite pneumothorax. IMPRESSION: Similar diffuse bilateral consolidative opacities compatible with pneumonia. Small bilateral pleural effusions. Electronically Signed   By: Lovey Newcomer M.D.   On: 04/25/2019 09:17   Dg Chest Port 1 View  Result Date: 04/24/2019 CLINICAL DATA:  Respiratory distress, pneumonia EXAM: PORTABLE CHEST 1 VIEW COMPARISON:  04/22/2019 FINDINGS: Patchy bilateral airspace opacities throughout both lungs, right greater than left have increased slightly since prior study. Small left pleural effusion and moderate right pleural effusion, similar prior study. Heart is borderline in size. IMPRESSION: Patchy bilateral airspace opacities, right greater than left most compatible with pneumonia. Stable bilateral effusions also greater on the right. Electronically Signed   By: Rolm Baptise M.D.   On: 04/24/2019 11:36   Vas [place] Lower Extremity Arterial Duplex  Result Date: 04/24/2019 LOWER EXTREMITY ARTERIAL DUPLEX STUDY Indications: Peripheral artery disease.  Current ABI: Non compressible Limitations: Positionimg of the patient and respiratory interference.              Overpowering venous signal Comparison       No  right lower extremity arterial duplex available for Study:           comparison Performing Technologist: Toma Copier RVS  Examination Guidelines: A complete evaluation includes B-mode imaging, spectral Doppler, color Doppler, and power Doppler as needed of all accessible portions of each vessel. Bilateral testing is  considered an integral part of a complete examination. Limited examinations for reoccurring indications may be performed as noted.  +----------+--------+-----+--------+---------+---------------------------------+  RIGHT      PSV cm/s Ratio Stenosis Waveform  Comments                           +----------+--------+-----+--------+---------+---------------------------------+  CFA Prox   93                      triphasic mild plaque                        +----------+--------+-----+--------+---------+---------------------------------+  DFA        107                     triphasic mild plaque                        +----------+--------+-----+--------+---------+---------------------------------+  SFA Prox   120                     triphasic mild plaque                        +----------+--------+-----+--------+---------+---------------------------------+  SFA Mid    92                      triphasic mild plaque                        +----------+--------+-----+--------+---------+---------------------------------+  SFA Distal 100                     triphasic mild plaque                        +----------+--------+-----+--------+---------+---------------------------------+  POP Prox   78                      triphasic mild plaque                        +----------+--------+-----+--------+---------+---------------------------------+  POP Distal 79                      biphasic  mild plaque                        +----------+--------+-----+--------+---------+---------------------------------+  ATA Distal 37                                                                   +----------+--------+-----+--------+---------+---------------------------------+  PTA Prox   50                                Difficult  to evaluate waveforms                                                  due to strong venous signal                                                      moderate plaque                     +----------+--------+-----+--------+---------+---------------------------------+  PTA Mid    29                                mild plaque                        +----------+--------+-----+--------+---------+---------------------------------+  PTA Distal 66                                                                   +----------+--------+-----+--------+---------+---------------------------------+  Summary: Right: ABIs are unreliable. There is no obvious evidence of significant stenosis. However there is calcification of the arteries worsening moving distally from the thigh to the ankle.  See table(s) above for measurements and observations. Electronically signed by Monica Martinez MD on 04/24/2019 at 4:13:19 PM.    Final         Scheduled Meds:  sodium chloride   Intravenous Once   Chlorhexidine Gluconate Cloth  6 each Topical Q0600   etomidate  20 mg Intravenous Once   feeding supplement (GLUCERNA SHAKE)  237 mL Oral TID BM   feeding supplement (PRO-STAT SUGAR FREE 64)  30 mL Oral TID WC   fentaNYL       fentaNYL (SUBLIMAZE) injection  100 mcg Intravenous Once   folic acid  1 mg Oral Daily   heparin  5,000 Units Subcutaneous Q8H   insulin aspart  0-9 Units Subcutaneous TID WC   insulin aspart  2 Units Subcutaneous TID WC   insulin detemir  7 Units Subcutaneous Daily   midazolam       midazolam       midazolam  2 mg Intravenous Once   mirtazapine  15 mg Oral QHS   multivitamin with minerals  1 tablet Oral Daily   nutrition supplement (JUVEN)  1 packet Oral BID BM   pantoprazole (PROTONIX) IV  40 mg Intravenous Q24H   rocuronium  100 mg Intravenous Once   sertraline  200 mg Oral Daily   sodium chloride flush  3 mL Intravenous Q12H   ziprasidone  40 mg Intramuscular QHS   Continuous Infusions:  sodium chloride Stopped (04/25/19 0346)   amiodarone 30 mg/hr (04/25/19 1529)   ceFEPime (MAXIPIME) IV Stopped (04/25/19 0305)   levETIRAcetam 250 mg  (04/25/19 1103)   magnesium sulfate bolus IVPB     metronidazole 500 mg (04/25/19 1401)   phenylephrine (NEO-SYNEPHRINE) Adult infusion 28 mcg/min (04/25/19 1530)  sodium bicarbonate (isotonic) infusion in sterile water 50 mL/hr at 04/25/19 1300     LOS: 13 days        Alma Friendly, MD [facility] 04/25/2019, 4:12 PM

## 2019-04-26 ENCOUNTER — Inpatient Hospital Stay (HOSPITAL_COMMUNITY): Payer: Medicaid Other

## 2019-04-26 DIAGNOSIS — N179 Acute kidney failure, unspecified: Secondary | ICD-10-CM

## 2019-04-26 LAB — POCT I-STAT 7, (LYTES, BLD GAS, ICA,H+H)
Acid-base deficit: 10 mmol/L — ABNORMAL HIGH (ref 0.0–2.0)
Acid-base deficit: 9 mmol/L — ABNORMAL HIGH (ref 0.0–2.0)
Bicarbonate: 18.1 mmol/L — ABNORMAL LOW (ref 20.0–28.0)
Bicarbonate: 17.6 mmol/L — ABNORMAL LOW (ref 20.0–28.0)
Calcium, Ion: 1.14 mmol/L — ABNORMAL LOW (ref 1.15–1.40)
Calcium, Ion: 1.1 mmol/L — ABNORMAL LOW (ref 1.15–1.40)
HCT: 25 % — ABNORMAL LOW (ref 39.0–52.0)
HCT: 24 % — ABNORMAL LOW (ref 39.0–52.0)
Hemoglobin: 8.5 g/dL — ABNORMAL LOW (ref 13.0–17.0)
Hemoglobin: 8.2 g/dL — ABNORMAL LOW (ref 13.0–17.0)
O2 Saturation: 72 %
O2 Saturation: 90 %
Patient temperature: 98.6
Patient temperature: 97.6
Potassium: 4.1 mmol/L (ref 3.5–5.1)
Potassium: 3.9 mmol/L (ref 3.5–5.1)
Sodium: 145 mmol/L (ref 135–145)
Sodium: 146 mmol/L — ABNORMAL HIGH (ref 135–145)
TCO2: 20 mmol/L — ABNORMAL LOW (ref 22–32)
TCO2: 19 mmol/L — ABNORMAL LOW (ref 22–32)
pCO2 arterial: 50.9 mmHg — ABNORMAL HIGH (ref 32.0–48.0)
pCO2 arterial: 41.1 mmHg (ref 32.0–48.0)
pH, Arterial: 7.16 — CL (ref 7.350–7.450)
pH, Arterial: 7.237 — ABNORMAL LOW (ref 7.350–7.450)
pO2, Arterial: 48 mmHg — ABNORMAL LOW (ref 83.0–108.0)
pO2, Arterial: 66 mmHg — ABNORMAL LOW (ref 83.0–108.0)

## 2019-04-26 LAB — CBC WITH DIFFERENTIAL/PLATELET
Abs Immature Granulocytes: 0.16 10*3/uL — ABNORMAL HIGH (ref 0.00–0.07)
Basophils Absolute: 0.1 10*3/uL (ref 0.0–0.1)
Basophils Relative: 0 %
Eosinophils Absolute: 0.1 10*3/uL (ref 0.0–0.5)
Eosinophils Relative: 0 %
HCT: 26.6 % — ABNORMAL LOW (ref 39.0–52.0)
Hemoglobin: 8.5 g/dL — ABNORMAL LOW (ref 13.0–17.0)
Immature Granulocytes: 1 %
Lymphocytes Relative: 5 %
Lymphs Abs: 1.1 10*3/uL (ref 0.7–4.0)
MCH: 25.8 pg — ABNORMAL LOW (ref 26.0–34.0)
MCHC: 32 g/dL (ref 30.0–36.0)
MCV: 80.9 fL (ref 80.0–100.0)
Monocytes Absolute: 0.4 10*3/uL (ref 0.1–1.0)
Monocytes Relative: 2 %
Neutro Abs: 19.1 10*3/uL — ABNORMAL HIGH (ref 1.7–7.7)
Neutrophils Relative %: 92 %
Platelets: 371 10*3/uL (ref 150–400)
RBC: 3.29 MIL/uL — ABNORMAL LOW (ref 4.22–5.81)
RDW: 18.6 % — ABNORMAL HIGH (ref 11.5–15.5)
WBC: 20.9 10*3/uL — ABNORMAL HIGH (ref 4.0–10.5)
nRBC: 0.1 % (ref 0.0–0.2)

## 2019-04-26 LAB — RENAL FUNCTION PANEL
Albumin: 1.2 g/dL — ABNORMAL LOW (ref 3.5–5.0)
Anion gap: 13 (ref 5–15)
BUN: 54 mg/dL — ABNORMAL HIGH (ref 6–20)
CO2: 17 mmol/L — ABNORMAL LOW (ref 22–32)
Calcium: 7.3 mg/dL — ABNORMAL LOW (ref 8.9–10.3)
Chloride: 113 mmol/L — ABNORMAL HIGH (ref 98–111)
Creatinine, Ser: 3.71 mg/dL — ABNORMAL HIGH (ref 0.61–1.24)
GFR calc Af Amer: 20 mL/min — ABNORMAL LOW (ref >60)
GFR calc non Af Amer: 17 mL/min — ABNORMAL LOW (ref >60)
Glucose, Bld: 139 mg/dL — ABNORMAL HIGH (ref 70–99)
Phosphorus: 4.3 mg/dL (ref 2.5–4.6)
Potassium: 3.9 mmol/L (ref 3.5–5.1)
Sodium: 143 mmol/L (ref 135–145)

## 2019-04-26 LAB — CULTURE, BLOOD (ROUTINE X 2)
Culture: NO GROWTH
Culture: NO GROWTH
Special Requests: ADEQUATE
Special Requests: ADEQUATE

## 2019-04-26 LAB — BLOOD GAS, ARTERIAL
Acid-base deficit: 10.1 mmol/L — ABNORMAL HIGH (ref 0.0–2.0)
Bicarbonate: 16.1 mmol/L — ABNORMAL LOW (ref 20.0–28.0)
Drawn by: 358491
FIO2: 100
MECHVT: 580 mL
O2 Saturation: 93.2 %
PEEP: 12 cmH2O
Patient temperature: 98.6
pCO2 arterial: 40.8 mmHg (ref 32.0–48.0)
pH, Arterial: 7.221 — ABNORMAL LOW (ref 7.350–7.450)
pO2, Arterial: 74.2 mmHg — ABNORMAL LOW (ref 83.0–108.0)

## 2019-04-26 LAB — URINALYSIS, ROUTINE W REFLEX MICROSCOPIC
Bilirubin Urine: NEGATIVE
Glucose, UA: NEGATIVE mg/dL
Ketones, ur: NEGATIVE mg/dL
Nitrite: NEGATIVE
Protein, ur: 100 mg/dL — AB
RBC / HPF: >50 RBC/hpf — ABNORMAL HIGH (ref 0–5)
Specific Gravity, Urine: 1.014 (ref 1.005–1.030)
WBC, UA: >50 WBC/hpf — ABNORMAL HIGH (ref 0–5)
pH: 5 (ref 5.0–8.0)

## 2019-04-26 LAB — BASIC METABOLIC PANEL
Anion gap: 10 (ref 5–15)
BUN: 53 mg/dL — ABNORMAL HIGH (ref 6–20)
CO2: 19 mmol/L — ABNORMAL LOW (ref 22–32)
Calcium: 7.5 mg/dL — ABNORMAL LOW (ref 8.9–10.3)
Chloride: 114 mmol/L — ABNORMAL HIGH (ref 98–111)
Creatinine, Ser: 4.31 mg/dL — ABNORMAL HIGH (ref 0.61–1.24)
GFR calc Af Amer: 16 mL/min — ABNORMAL LOW (ref >60)
GFR calc non Af Amer: 14 mL/min — ABNORMAL LOW (ref >60)
Glucose, Bld: 94 mg/dL (ref 70–99)
Potassium: 4.2 mmol/L (ref 3.5–5.1)
Sodium: 143 mmol/L (ref 135–145)

## 2019-04-26 LAB — GLUCOSE, CAPILLARY
Glucose-Capillary: 126 mg/dL — ABNORMAL HIGH (ref 70–99)
Glucose-Capillary: 129 mg/dL — ABNORMAL HIGH (ref 70–99)
Glucose-Capillary: 77 mg/dL (ref 70–99)
Glucose-Capillary: 78 mg/dL (ref 70–99)
Glucose-Capillary: 81 mg/dL (ref 70–99)
Glucose-Capillary: 89 mg/dL (ref 70–99)

## 2019-04-26 LAB — MAGNESIUM
Magnesium: 1.9 mg/dL (ref 1.7–2.4)
Magnesium: 2 mg/dL (ref 1.7–2.4)

## 2019-04-26 LAB — PHOSPHORUS: Phosphorus: 4.7 mg/dL — ABNORMAL HIGH (ref 2.5–4.6)

## 2019-04-26 MED ORDER — MIRTAZAPINE 15 MG PO TABS
15.0000 mg | ORAL_TABLET | Freq: Every day | ORAL | Status: DC
  Administered 2019-04-26 – 2019-04-27 (×2): 15 mg
  Filled 2019-04-26 (×2): qty 1

## 2019-04-26 MED ORDER — PRO-STAT SUGAR FREE PO LIQD
30.0000 mL | Freq: Three times a day (TID) | ORAL | Status: DC
  Administered 2019-04-26 – 2019-04-27 (×4): 30 mL
  Filled 2019-04-26 (×4): qty 30

## 2019-04-26 MED ORDER — ADULT MULTIVITAMIN LIQUID CH
15.0000 mL | Freq: Every day | ORAL | Status: DC
  Administered 2019-04-27 – 2019-04-28 (×2): 15 mL via ORAL
  Filled 2019-04-26 (×2): qty 15

## 2019-04-26 MED ORDER — PRISMASOL BGK 4/2.5 32-4-2.5 MEQ/L IV SOLN
INTRAVENOUS | Status: DC
  Administered 2019-04-26 – 2019-04-28 (×16): via INTRAVENOUS_CENTRAL
  Filled 2019-04-26 (×27): qty 5000

## 2019-04-26 MED ORDER — LIDOCAINE HCL 1 % IJ SOLN
5.0000 mL | Freq: Once | INTRAMUSCULAR | Status: AC
  Administered 2019-04-26: 1 mL via INTRADERMAL
  Filled 2019-04-26: qty 5

## 2019-04-26 MED ORDER — INSULIN ASPART 100 UNIT/ML ~~LOC~~ SOLN
0.0000 [IU] | Freq: Four times a day (QID) | SUBCUTANEOUS | Status: DC
  Administered 2019-04-26 – 2019-04-27 (×3): 1 [IU] via SUBCUTANEOUS

## 2019-04-26 MED ORDER — OXYCODONE HCL 5 MG/5ML PO SOLN: 5.0000 mg | ORAL | Status: DC | PRN

## 2019-04-26 MED ORDER — JUVEN PO PACK
1.0000 | PACK | Freq: Two times a day (BID) | ORAL | Status: DC
  Administered 2019-04-26 – 2019-04-28 (×5): 1
  Filled 2019-04-26 (×5): qty 1

## 2019-04-26 MED ORDER — SODIUM CHLORIDE 0.9 % IV SOLN
2.0000 g | Freq: Two times a day (BID) | INTRAVENOUS | Status: DC
  Administered 2019-04-26: 2 g via INTRAVENOUS
  Filled 2019-04-26: qty 2

## 2019-04-26 MED ORDER — PHENYLEPHRINE HCL-NACL 40-0.9 MG/250ML-% IV SOLN
0.0000 ug/min | INTRAVENOUS | Status: DC
  Administered 2019-04-26: 90 ug/min via INTRAVENOUS
  Administered 2019-04-28: 05:00:00 100 ug/min via INTRAVENOUS
  Administered 2019-04-28: 18:00:00 200 ug/min via INTRAVENOUS
  Administered 2019-04-28: 13:00:00 100 ug/min via INTRAVENOUS
  Administered 2019-04-28: 09:00:00 250 ug/min via INTRAVENOUS
  Filled 2019-04-26 (×6): qty 250

## 2019-04-26 MED ORDER — PRISMASOL BGK 4/2.5 32-4-2.5 MEQ/L REPLACEMENT SOLN
Status: DC
  Administered 2019-04-26 – 2019-04-28 (×4): via INTRAVENOUS_CENTRAL
  Filled 2019-04-26 (×5): qty 5000

## 2019-04-26 MED ORDER — MIDAZOLAM HCL 2 MG/2ML IJ SOLN
INTRAMUSCULAR | Status: AC
  Filled 2019-04-26: qty 2

## 2019-04-26 MED ORDER — SENNOSIDES 8.8 MG/5ML PO SYRP
10.0000 mL | ORAL_SOLUTION | Freq: Every day | ORAL | Status: DC | PRN
  Administered 2019-04-26: 10 mL
  Filled 2019-04-26: qty 10

## 2019-04-26 MED ORDER — MIDAZOLAM HCL 2 MG/2ML IJ SOLN
2.0000 mg | Freq: Once | INTRAMUSCULAR | Status: AC
  Administered 2019-04-26: 11:00:00 2 mg via INTRAVENOUS

## 2019-04-26 MED ORDER — CHLORHEXIDINE GLUCONATE 0.12% ORAL RINSE (MEDLINE KIT)
15.0000 mL | Freq: Two times a day (BID) | OROMUCOSAL | Status: DC
  Administered 2019-04-26 – 2019-04-28 (×5): 15 mL via OROMUCOSAL

## 2019-04-26 MED ORDER — LIDOCAINE HCL (PF) 1 % IJ SOLN
INTRAMUSCULAR | Status: AC
  Filled 2019-04-26: qty 5

## 2019-04-26 MED ORDER — FUROSEMIDE 10 MG/ML IJ SOLN
80.0000 mg | Freq: Once | INTRAMUSCULAR | Status: AC
  Administered 2019-04-26: 09:00:00 80 mg via INTRAVENOUS
  Filled 2019-04-26: qty 8

## 2019-04-26 MED ORDER — SERTRALINE HCL 100 MG PO TABS
200.0000 mg | ORAL_TABLET | Freq: Every day | ORAL | Status: DC
  Administered 2019-04-27 – 2019-04-28 (×2): 200 mg
  Filled 2019-04-26 (×2): qty 2

## 2019-04-26 MED ORDER — BISACODYL 10 MG RE SUPP: 10.0000 mg | Freq: Every day | RECTAL | Status: DC | PRN

## 2019-04-26 MED ORDER — FOLIC ACID 1 MG PO TABS
1.0000 mg | ORAL_TABLET | Freq: Every day | ORAL | Status: DC
  Administered 2019-04-27 – 2019-04-28 (×2): 1 mg
  Filled 2019-04-26 (×2): qty 1

## 2019-04-26 MED ORDER — ACETAMINOPHEN 160 MG/5ML PO SOLN: 325.0000 mg | ORAL | Status: DC | PRN

## 2019-04-26 MED ORDER — ORAL CARE MOUTH RINSE
15.0000 mL | OROMUCOSAL | Status: DC
  Administered 2019-04-26 – 2019-04-28 (×24): 15 mL via OROMUCOSAL

## 2019-04-26 MED ORDER — NOREPINEPHRINE 4 MG/250ML-% IV SOLN
0.0000 ug/min | INTRAVENOUS | Status: DC
  Administered 2019-04-26: 10 ug/min via INTRAVENOUS
  Filled 2019-04-26: qty 250

## 2019-04-26 MED ORDER — PRISMASOL BGK 4/2.5 32-4-2.5 MEQ/L REPLACEMENT SOLN
Status: DC
  Administered 2019-04-26 – 2019-04-28 (×5): via INTRAVENOUS_CENTRAL
  Filled 2019-04-26 (×6): qty 5000

## 2019-04-26 MED ORDER — DOCUSATE SODIUM 50 MG/5ML PO LIQD: 50.0000 mg | Freq: Every day | ORAL | Status: DC

## 2019-04-26 MED ORDER — NOREPINEPHRINE 16 MG/250ML-% IV SOLN
0.0000 ug/min | INTRAVENOUS | Status: DC
  Administered 2019-04-26: 8 ug/min via INTRAVENOUS
  Administered 2019-04-27: 25 ug/min via INTRAVENOUS
  Administered 2019-04-27: 23 ug/min via INTRAVENOUS
  Administered 2019-04-28: 18:00:00 60 ug/min via INTRAVENOUS
  Administered 2019-04-28: 06:00:00 40 ug/min via INTRAVENOUS
  Filled 2019-04-26 (×6): qty 250

## 2019-04-26 MED ORDER — FLUCONAZOLE IN SODIUM CHLORIDE 400-0.9 MG/200ML-% IV SOLN
400.0000 mg | INTRAVENOUS | Status: DC
  Administered 2019-04-26 – 2019-04-27 (×2): 400 mg via INTRAVENOUS
  Filled 2019-04-26 (×4): qty 200

## 2019-04-26 MED ORDER — VITAL HIGH PROTEIN PO LIQD
1000.0000 mL | ORAL | Status: DC
  Administered 2019-04-26: 15:00:00 1000 mL

## 2019-04-26 MED ORDER — STERILE WATER FOR INJECTION IJ SOLN
INTRAMUSCULAR | Status: AC
  Filled 2019-04-26: qty 10

## 2019-04-26 MED ORDER — HEPARIN SODIUM (PORCINE) 1000 UNIT/ML DIALYSIS
1000.0000 [IU] | INTRAMUSCULAR | Status: DC | PRN
  Administered 2019-04-26: 2800 [IU] via INTRAVENOUS_CENTRAL
  Filled 2019-04-26 (×2): qty 6

## 2019-04-26 MED ORDER — SENNOSIDES 8.8 MG/5ML PO SYRP: 10.0000 mL | ORAL_SOLUTION | Freq: Every day | ORAL | Status: DC | PRN

## 2019-04-26 MED ORDER — SODIUM CHLORIDE 0.9 % IV SOLN
1.0000 g | Freq: Two times a day (BID) | INTRAVENOUS | Status: DC
  Administered 2019-04-26 – 2019-04-28 (×5): 1 g via INTRAVENOUS
  Filled 2019-04-26 (×6): qty 1

## 2019-04-26 MED ORDER — DOCUSATE SODIUM 50 MG/5ML PO LIQD
50.0000 mg | Freq: Every day | ORAL | Status: DC
  Administered 2019-04-28: 50 mg
  Filled 2019-04-26: qty 10

## 2019-04-26 MED ORDER — VASOPRESSIN 20 UNIT/ML IV SOLN
0.0400 [IU]/min | INTRAVENOUS | Status: DC
  Administered 2019-04-26: 0.03 [IU]/min via INTRAVENOUS
  Administered 2019-04-27 – 2019-04-28 (×3): 0.04 [IU]/min via INTRAVENOUS
  Filled 2019-04-26 (×5): qty 2

## 2019-04-26 MED ORDER — PHENYLEPHRINE HCL-NACL 10-0.9 MG/250ML-% IV SOLN
0.0000 ug/min | INTRAVENOUS | Status: DC
  Administered 2019-04-26: 20 ug/min via INTRAVENOUS
  Filled 2019-04-26: qty 250

## 2019-04-26 NOTE — Progress Notes (Signed)
NAME:  [name], MRN:  [id number], DOB:  09-22-1960, LOS: 57 ADMISSION DATE:  04/11/2019, CONSULTATION DATE:  04/26/19 REFERRING MD: Dr. Horris Latino , CHIEF COMPLAINT:   Acute hypoxic respiratory failure  Brief History   58 y/o M admitted 8/7 for gangrenous, non-healing L foot ulcer, eventually underwent L AKA, developed likely aspiration PNA.  PCCM consulted for worsening respiratory status requiring Bipap, intubated 8/22.   History of present illness   58 y.o. M with PMH of severe autism and elective mutism, anemia on iron infusions outpatient, Type 2 DM, CKD, Schizoaffective disorder, and suprapubic catheter for urinary strictures, wheelchair bound who was admitted on 04/09/2019 for non-healing L heel ulcer.  This initially began in June 2020 per notes and  was previously treated outpatient and underwent surgical debridement on 6/25.   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 Scot Dock.  He has been treated with IV abx, noted to have some fevers and chills despite surgery, and CXR showed possible R opacity. Seen by ID, concern for possible aspiration and was continued on Cefepime and Flagyl.  He has also been anemic since admission, received 2 units PRBC's on 8/7 and one unit on 8/19.    pt noted to be febrile to 101.9 and was having worsening respiratory distress and required Bipap.  He was not hypoxic by pulse ox, but  ABG concerning for PO2 of 40, so PCCM was consulted.    Past Medical History  Schizoaffective Disorder, severe autism and selective mutism, CKD, indwelling suprapubic catheter  Significant Hospital Events   04/09/2019 Admitted to hospitalists 8/18 L AKA 8/21 Respiratory distress, txfr to ICU 8/22 respiratory status and mental status cont to worsen despite diuresis, intubated.  Started on low dose neosynephrine/   Consults:  8/7 Vascular Surgery  8/20 Infectious Disease 8/21 PCCM  Procedures:  8/18 L AKA  Significant Diagnostic Tests:   8/20 Echocardiogram>>EF 60-65%, unable to assess RV systolic pressure, but normal RV systolic function [id number] CXR>>patchy bilateral infiltrates increased since CXR on 8/19 8/21 Renal US>>   Micro Data:  8/7 Sars-CoV-2>>neg 8/12 BCx2>>negative  8/18 Sars-CoV-2>>negative 8/18 BCx2>> 8/19 UC>>70,000 colonies yeast 8/19 MRSA>>neg  Antimicrobials:  Zosyn 8/7>8/13 Ceftriaxone 8/14>8/18 Vancomycin 8/7>8/11, 8/18 x1 Flagyl 8/15> Cefepime 8/18> Diflucan 8/23>  Interim history/subjective:  Overnight still having intermittent fevers Increasing phenylephrine needs.   Worsening renal failure, poor urine output.     Objective   Blood pressure 100/70, pulse 90, temperature (!) 101.4 F (38.6 C), temperature source Axillary, resp. rate 20, height 5\' 10"  (1.778 m), weight 81.6 kg, SpO2 100 %. on Bipap 12/6 CVP:  [13 mmHg] 13 mmHg  Vent Mode: PRVC FiO2 (%):  [80 %-100 %] 80 % Set Rate:  [16 bmp] 16 bmp Vt Set:  [580 mL] 580 mL PEEP:  [12 cmH20-14 cmH20] 12 cmH20 Plateau Pressure:  [21 cmH20-29 cmH20] 21 cmH20   Intake/Output Summary (Last 24 hours) at 04/26/2019 0803 Last data filed at 04/26/2019 0741 Gross per 24 hour  Intake 3900.25 ml  Output 355 ml  Net 3545.25 ml   Filed Weights   04/20/19 0627 04/23/19 0456 04/24/19 0433  Weight: 81.6 kg 85.7 kg 81.6 kg    General:   chronically ill-appearing M in no acute distress, myoclonus with stimulation  HEENT: MM pink/moist Neuro: opens eyes to command, not following commands otherwise, not blinking to threat, not really withdrawing to pain CV: s1s2, regular rate and rhythm, no m/r/g PULM:  Minimal  crackles diffusely  GI: soft, bsx4 active  Extremities: s.p L AKA warm/dry, no edema  Stump incision c/D/I Skin: no rashes or lesions, warm  GU: Suprapubic catheter with thick, whitish uop.    Resolved Hospital Problem list   L heel ulcer  Assessment & Plan:    Acute hypoxic respiratory failure - aspiration PNA, pulm edema, ARDS  component.  -Pneumonia likely secondary to aspiration, ID following.  Sputum culture pending.  Blood cultures pending.  Continue Cefepime and Flagyl per ID recs -Echo with EF of 60-65 %, worsening renal function, IVF and transfusion likely contributing.  Trial of diuresis again today,will likely proceed with CRRT today.  BNP >1000 on 8/21  Low tidal volume ventilation as tolerates  Atrial Fibrillation Resolved on amio gtt.  Consider stopping after 24 hr load today, if remains in sinus.   Consider anticoagulation but holding off for now given risks of bleeding.   Hypotension, fevers:  Sepsis 2/2 PNA likely.  Possible uti with candida, given other comorbidities?  Will discuss with ID. Cont current abtx , adding diflucan.  CT chest and abd to look for other possible sources Repeat ua given urine appearance.   neosynephrine.  Art line today.  Try to transition to levophed/vasopressin, which I think will be more beneficial to his kidneys.   Acute on chronic renal failure II and metabolic acidosis -Cr increasing, uop dropped a lot over last shift. Acidosis improving slightly.  possibly exacerabated hyperchloremia in the setting of IVF and renal failure Baseline creatinine 1.6-2.0 P: Trial of diuresis with lasix 80,  -Appreciate nephrology recommendations Bicarb held given volume overload.   hypoalb 1.3    Metabolic acidosis:  Co2 up to 18 today  BUN increased today (55) Compensation with respiratory alkalosis.  ABG pending.   Anemia:  Cont to monitor closely.  Possibly 2/2 renal failure.  HB 8.5 today.   Type 2 DM -continue Le[place] and SSI  Urinary strictures , suprapubic cath   Schizoaffective Disorder, Autism, selective mutism -Continue prn Geodon and Ativan  HTN  BPH   Best practice:  Diet: NPO, starting tf  Pain/Anxiety/Delirium protocol (if indicated): Geodon, on precedex, fentanyl  VAP protocol (if indicated): n/a DVT prophylaxis: heparin GI prophylaxis:  n/a Glucose control: SSI Mobility: bed rest Code Status: fever Family Communication:  Disposition:  ICU  Labs   CBC: Recent Labs  Lab 04/30/2019 2234 04/22/19 0202  04/23/19 0413 04/24/19 0543  04/24/19 2241 04/25/19 0556 04/25/19 1735 04/25/19 1853 04/26/19 0400  WBC 12.3* 12.1*  --  15.4* 12.7*  --   --   --   --  20.8* 20.9*  NEUTROABS 9.4*  --   --  11.8* 11.0*  --   --   --   --  20.0* 19.1*  HGB 7.0* 6.9*   < > 7.9* 7.5*   < > 7.5* 7.1* 7.8* 7.8* 8.5*  HCT 22.9* 22.3*   < > 25.5* 23.9*   < > 22.0* 21.0* 23.0* 24.1* 26.6*  MCV 84.2 82.9  --  83.1 82.4  --   --   --   --  79.8* 80.9  PLT 284 262  --  297 320  --   --   --   --  330 371   < > = values in this interval not displayed.    Basic Metabolic Panel: Recent Labs  Lab 04/20/19 0707 04/24/2019 0349  04/22/19 0202 04/23/19 0413  04/24/19 1531 04/24/19 1820  04/25/19 0018 04/25/19 [id number]  04/25/19 1735 04/25/19 1853 04/26/19 0400  NA 140 136   < > 138 137   < > 139 142   < > 142 145 145 143 143  K 3.7 4.2   < > 3.7 4.0   < > 6.0* 3.7   < > 3.8 4.0 3.5 3.6 4.2  CL 110 110   < > 113* 114*   < > 114* 115*  --  113*  --   --  113* 114*  CO2 18* 16*   < > 18* 11*   < > 13* 14*  --  16*  --   --  20* 19*  GLUCOSE 192* 497*   < > 248* 266*   < > 215* 148*  --  186*  --   --  99 94  BUN 32* 31*   < > 28* 34*   < > 49* 47*  --  49*  --   --  51* 53*  CREATININE 2.03* 2.15*   < > 2.11* 2.50*   < > 3.33* 3.47*  --  3.60*  --   --  3.95* 4.31*  CALCIUM 7.9* 7.7*   < > 7.4* 7.8*   < > 7.8* 8.0*  --  8.0*  --   --  7.6* 7.5*  MG 1.7 1.6*  --  1.5* 1.7  --   --   --   --  1.8  --   --   --   --   PHOS  --   --   --   --   --   --   --   --   --  3.6  --   --   --   --    < > = values in this interval not displayed.   GFR: Estimated Creatinine Clearance: 19.5 mL/min (A) (by C-G formula based on SCr of 4.31 mg/dL (H)). Recent Labs  Lab 05/01/2019 2234 04/22/19 0202 04/23/19 0413 04/24/19 0543 04/25/19 1853 04/26/19 0400   WBC 12.3* 12.1* 15.4* 12.7* 20.8* 20.9*  LATICACIDVEN 3.0* 0.9  --   --   --   --     Liver Function Tests: Recent Labs  Lab 04/25/19 1853  AST 102*  ALT 21  ALKPHOS 136*  BILITOT 0.7  PROT 5.1*  ALBUMIN 1.3*   No results for input(s): LIPASE, AMYLASE in the last 168 hours. No results for input(s): AMMONIA in the last 168 hours.  ABG    Component Value Date/Time   PHART 7.291 (L) 04/25/2019 0556   PCO2ART 29.1 (L) 04/25/2019 0556   PO2ART 56.0 (L) 04/25/2019 0556   HCO3 21.4 04/25/2019 1735   TCO2 23 04/25/2019 1735   ACIDBASEDEF 5.0 (H) 04/25/2019 1735   O2SAT 55.0 04/25/2019 1735     Coagulation Profile: No results for input(s): INR, PROTIME in the last 168 hours.  Cardiac Enzymes: No results for input(s): CKTOTAL, CKMB, CKMBINDEX, TROPONINI in the last 168 hours.  HbA1C: Hgb A1c MFr Bld  Date/Time Value Ref Range Status  04/11/2019 04:09 AM 6.7 (H) 4.8 - 5.6 % Final    Comment:    (NOTE) Pre diabetes:          5.7%-6.4% Diabetes:              >6.4% Glycemic control for   <7.0% adults with diabetes   10/22/2011 09:12 AM 8.0 (H) <5.7 % Final    Comment:    (NOTE)  According to the ADA Clinical Practice Recommendations for 2011, when HbA1c is used as a screening test:  >=6.5%   Diagnostic of Diabetes Mellitus           (if abnormal result is confirmed) 5.7-6.4%   Increased risk of developing Diabetes Mellitus References:Diagnosis and Classification of Diabetes Mellitus,Diabetes [id number] 1):S62-S69 and Standards of Medical Care in         Diabetes - 2011,Diabetes [id number] (Suppl 1):S11-S61.    CBG: Recent Labs  Lab 04/25/19 1730 04/25/19 1811 04/25/19 2053 04/26/19 0046 04/26/19 0340  GLUCAP 50* 97 77 78 81    Review of Systems:   Review of Systems - unable to obtain secondary to mental status   Past Medical History  He,  has a past medical history of Autism,  BPH (benign prostatic hyperplasia), Depression, Diabetes mellitus, Elective mutism, Gastroparesis, Hyperglycemia, Hyperosmolar (nonketotic) coma ([place]), Hyponatremia, Mental retardation, Mental retardation, Pneumonia, Renal disorder, Urinary retention, and UTI (urinary tract infection).   Surgical History    Past Surgical History:  Procedure Laterality Date  . AMPUTATION Left 04/12/2019   Procedure: AMPUTATION ABOVE KNEE LEFT LEG;  Surgeon: Angelia Mould, MD;  Location: [facility] OR;  Service: Vascular;  Laterality: Left;     Social History   reports that he has never smoked. He has never used smokeless tobacco. He reports that he does not drink alcohol or use drugs.   Family History   His family history is not on file.   Allergies No Known Allergies   Home Medications  Prior to Admission medications   Medication Sig Start Date End Date Taking? Authorizing Provider  acetaminophen (TYLENOL) 325 MG tablet Take 650 mg by mouth every 4 (four) hours as needed for fever (pain).    Yes [provider]  amLODipine (NORVASC) 10 MG tablet Take 10 mg by mouth daily.   Yes [provider]  ertapenem (INVANZ) 1 g injection Inject 1 g into the muscle daily.   Yes [provider]  famotidine (PEPCID) 10 MG tablet Take 20 mg by mouth daily.    Yes [provider]  hydrALAZINE (APRESOLINE) 25 MG tablet Take 25 mg by mouth 3 (three) times daily.   Yes [provider]  HYDROcodone-acetaminophen (NORCO/VICODIN) 5-325 MG tablet Take 1 tablet by mouth every 6 (six) hours as needed (pain).    Yes [provider]  insulin detemir (LEVEMIR) 100 unit/ml SOLN Inject 10 Units into the skin daily.   Yes [provider]  insulin NPH-regular Human (70-30) 100 UNIT/ML injection Inject 15 Units into the skin daily with breakfast. Hold if blood sugar < 150   Yes [provider]  insulin regular (NOVOLIN R) 100 units/mL injection Inject 10 Units into  the skin See admin instructions. Give 10 units as needed for blood sugars > 400   Yes [provider]  levETIRAcetam (KEPPRA) 250 MG tablet Take 250 mg by mouth 2 (two) times daily.   Yes [provider]  mirtazapine (REMERON) 15 MG tablet Take 15 mg by mouth at bedtime.    Yes [provider]  Multiple Vitamins-Minerals (MULTIVITAMIN WITH MINERALS) tablet Take 1 tablet by mouth daily.   Yes [provider]  Nutritional Supplements (PROMOD) LIQD Take 30 mLs by mouth 2 (two) times daily.   Yes [provider]  pioglitazone (ACTOS) 45 MG tablet Take 45 mg by mouth daily.   Yes [provider]  sertraline (ZOLOFT) 100 MG tablet Take 200 mg by  mouth daily.    Yes [provider]  vitamin C (ASCORBIC ACID) 500 MG tablet Take 500 mg by mouth 2 (two) times daily.   Yes [provider]  ziprasidone (GEODON) 20 MG capsule Take 20 mg by mouth daily.    Yes [provider]  ziprasidone (GEODON) 40 MG capsule Take 40 mg by mouth at bedtime.   Yes [provider]    Total critical care time 60 minutes.   [name], MD [facility]

## 2019-04-26 NOTE — Progress Notes (Signed)
Informed by [facility] nurse Gretchen no need for cxr this am per md

## 2019-04-26 NOTE — Progress Notes (Signed)
Patient ID: [name], male   DOB: [birth year], 58 y.o.   MRN: [id number]          [facility] for Infectious Disease    Date of Admission:  04/18/2019    Total days of antibiotics 17        Day 8 metronidazole        Day 6 cefepime  [name] is currently off the floor for chest CT scan.  His respiratory function continued to deteriorate yesterday afternoon and he required intubation.  He remains febrile.  Sputum Gram stain and culture are pending.  Recent blood cultures remain negative.  He has pyuria and the recent urine culture grew yeast.  He is not improving on current empiric antibiotic therapy so I will change him to meropenem and fluconazole.         Michel Bickers, MD [facility] [place] [phone] pager   [phone] cell 04/26/2019, 1:41 PM

## 2019-04-26 NOTE — Progress Notes (Signed)
Called [facility], informed nurse Jody pt with 30cc urine output past 6 hrs by suprapubic catheter and cvp 13. Will bladder scan pt to evaluate for retention

## 2019-04-26 NOTE — Progress Notes (Signed)
Progress Note  Patient Name: [name] Date of Encounter: 04/26/2019  Consulting Cardiologist:  Kirk Ruths, MD  Subjective   Patient sedated on ventilator.  Inpatient Medications    Scheduled Meds: . sodium chloride   Intravenous Once  . chlorhexidine gluconate (MEDLINE KIT)  15 mL Mouth Rinse BID  . Chlorhexidine Gluconate Cloth  6 each Topical Q0600  . feeding supplement (PRO-STAT SUGAR FREE 64)  30 mL Oral TID WC  . folic acid  1 mg Oral Daily  . furosemide  80 mg Intravenous Once  . heparin  5,000 Units Subcutaneous Q8H  . insulin aspart  0-9 Units Subcutaneous TID WC  . insulin aspart  2 Units Subcutaneous TID WC  . insulin detemir  7 Units Subcutaneous Daily  . mouth rinse  15 mL Mouth Rinse 10 times per day  . mirtazapine  15 mg Oral QHS  . multivitamin with minerals  1 tablet Oral Daily  . nutrition supplement (JUVEN)  1 packet Oral BID BM  . pantoprazole (PROTONIX) IV  40 mg Intravenous Q24H  . sertraline  200 mg Oral Daily  . sodium chloride flush  3 mL Intravenous Q12H  . ziprasidone  40 mg Intramuscular QHS   Continuous Infusions: .  prismasol BGK 4/2.5    .  prismasol BGK 4/2.5    . sodium chloride 10 mL/hr at 04/26/19 0700  . amiodarone 30 mg/hr (04/26/19 0700)  . ceFEPime (MAXIPIME) IV    . dexmedetomidine (PRECEDEX) IV infusion Stopped (04/26/19 0358)  . fluconazole (DIFLUCAN) IV    . levETIRAcetam Stopped (04/25/19 2219)  . magnesium sulfate bolus IVPB    . metronidazole Stopped (04/26/19 0737)  . phenylephrine (NEO-SYNEPHRINE) Adult infusion 170 mcg/min (04/26/19 0700)  . prismasol BGK 4/2.5     PRN Meds: sodium chloride, acetaminophen **OR** acetaminophen, heparin, ipratropium-albuterol, LORazepam, magnesium sulfate bolus IVPB, metoprolol tartrate, morphine injection, ondansetron **OR** ondansetron (ZOFRAN) IV, oxyCODONE-acetaminophen   Vital Signs    Vitals:   04/26/19 0747 04/26/19 0751 04/26/19 0800 04/26/19 0804  BP:  100/70  97/73   Pulse: 88 90 87 86  Resp: 20  (!) 24 20  Temp:    (!) 101 F (38.3 C)  TempSrc:    Axillary  SpO2: 100%  100% 100%  Weight:      Height:        Intake/Output Summary (Last 24 hours) at 04/26/2019 0846 Last data filed at 04/26/2019 0741 Gross per 24 hour  Intake 3900.28 ml  Output 355 ml  Net 3545.28 ml   Filed Weights   04/20/19 0627 04/23/19 0456 04/24/19 0433  Weight: 81.6 kg 85.7 kg 81.6 kg    Telemetry    Sinus rhythm with brief atrial runs.  Personally reviewed.  ECG    An ECG dated 04/24/2019 was personally reviewed today and demonstrated:  Sinus tachycardia with PACs, nonspecific ST changes.  Physical Exam   GEN:  Sedated on ventilator, no distress.   Cardiac: RRR, no gallop.  Respiratory: Nonlabored.  No wheezing. GI: Soft, nontender, bowel sounds present. MS: 1+ edema; No deformity.  Labs    Chemistry Recent Labs  Lab 04/25/19 0018  04/25/19 1735 04/25/19 1853 04/26/19 0400  NA 142   < > 145 143 143  K 3.8   < > 3.5 3.6 4.2  CL 113*  --   --  113* 114*  CO2 16*  --   --  20* 19*  GLUCOSE 186*  --   --  99 94  BUN 49*  --   --  51* 53*  CREATININE 3.60*  --   --  3.95* 4.31*  CALCIUM 8.0*  --   --  7.6* 7.5*  PROT  --   --   --  5.1*  --   ALBUMIN  --   --   --  1.3*  --   AST  --   --   --  102*  --   ALT  --   --   --  21  --   ALKPHOS  --   --   --  136*  --   BILITOT  --   --   --  0.7  --   GFRNONAA 18*  --   --  16* 14*  GFRAA 20*  --   --  18* 16*  ANIONGAP 13  --   --  10 10   < > = values in this interval not displayed.     Hematology Recent Labs  Lab 04/24/19 0543  04/25/19 1735 04/25/19 1853 04/26/19 0400  WBC 12.7*  --   --  20.8* 20.9*  RBC 2.90*  --   --  3.02* 3.29*  HGB 7.5*   < > 7.8* 7.8* 8.5*  HCT 23.9*   < > 23.0* 24.1* 26.6*  MCV 82.4  --   --  79.8* 80.9  MCH 25.9*  --   --  25.8* 25.8*  MCHC 31.4  --   --  32.4 32.0  RDW 18.1*  --   --  18.2* 18.6*  PLT 320  --   --  330 371   < > = values in this  interval not displayed.    BNP Recent Labs  Lab 04/24/19 0741  BNP 1,044.4*     Radiology    US Renal  Result Date: 04/24/2019 CLINICAL DATA:  Acute kidney injury. Peripheral vascular disease with gangrene. EXAM: RENAL / URINARY TRACT ULTRASOUND COMPLETE COMPARISON:  01/30/2012 renal ultrasound FINDINGS: Right Kidney: Renal measurements: 9.0 x 3.7 x 4.2 centimeters = volume: 92.9 mL . Echogenicity within normal limits. No mass or hydronephrosis visualized. Left Kidney: Renal measurements: 9.3 x 5.4 x 5.6 centimeters = volume: 148.7 mL. Echogenicity within normal limits. No mass or hydronephrosis visualized. Bladder: Bladder is decompressed by a Foley catheter. Additional: Study quality is degraded by patient motion artifact. IMPRESSION: 1. No acute abnormality. 2. Kidneys measure slightly smaller compared to prior. Kidney now 9.0 centimeters and previously 10.3 and 11.2 centimeters. LEFT kidney now measures 9.3 centimeters, previously 10.8 and 10.5 centimeters. 3. No hydronephrosis. 4. Foley catheter. Electronically Signed   By: Nolon Nations M.D.   On: 04/24/2019 13:32   Portable Chest X-ray  Result Date: 04/25/2019 CLINICAL DATA:  ET tube and NG tube placement EXAM: PORTABLE CHEST 1 VIEW COMPARISON:  04/25/2019 FINDINGS: Interval placement of right central line with the tip in the SVC. No pneumothorax endotracheal tube tip is 7.5 cm above the carina. NG tube enters the stomach. Bilateral airspace opacities are noted, right greater than left. Small right effusion and moderate left effusion. Heart is normal size. Airspace disease slightly worsened since prior study. IMPRESSION: Support devices as above. Diffuse bilateral airspace disease, right greater than left, increasing since prior study. Small right effusion and moderate left effusion. Electronically Signed   By: Rolm Baptise M.D.   On: 04/25/2019 17:32   Dg Chest Port 1 View  Result Date: 04/25/2019 CLINICAL DATA:  BiPAP.  EXAM:  PORTABLE CHEST 1 VIEW COMPARISON:  Chest radiograph 04/24/2019 FINDINGS: Monitoring leads overlie the patient. Stable cardiac and mediastinal contours. Similar diffuse bilateral areas of consolidation. Probable small bilateral pleural effusions. No definite pneumothorax. IMPRESSION: Similar diffuse bilateral consolidative opacities compatible with pneumonia. Small bilateral pleural effusions. Electronically Signed   By: Lovey Newcomer M.D.   On: 04/25/2019 09:17   Dg Chest Port 1 View  Result Date: 04/24/2019 CLINICAL DATA:  Respiratory distress, pneumonia EXAM: PORTABLE CHEST 1 VIEW COMPARISON:  04/22/2019 FINDINGS: Patchy bilateral airspace opacities throughout both lungs, right greater than left have increased slightly since prior study. Small left pleural effusion and moderate right pleural effusion, similar prior study. Heart is borderline in size. IMPRESSION: Patchy bilateral airspace opacities, right greater than left most compatible with pneumonia. Stable bilateral effusions also greater on the right. Electronically Signed   By: Rolm Baptise M.D.   On: 04/24/2019 11:36   Dg Abd Portable 1v  Result Date: 04/25/2019 CLINICAL DATA:  ETT and NG tube placement EXAM: PORTABLE ABDOMEN - 1 VIEW COMPARISON:  04/15/2019 FINDINGS: Enteric tube terminates in the proximal gastric body. Residual contrast within visualized bowel. IMPRESSION: Enteric tube terminates in the proximal gastric body. Electronically Signed   By: Julian Hy M.D.   On: 04/25/2019 17:38    Cardiac Studies   Echocardiogram 04/23/2019: 1. The left ventricle has normal systolic function, with an ejection fraction of 60-65%. The cavity size was normal.Technically difficult study due to limited windows. Cannot assess for focal wall motion abnormalities. 2. The right ventricle has normal systolc function. The cavity was normal. There is no increase in right ventricular wall thickness. Right ventricular systolic pressure could not be  assessed. 3. The aortic valve was not well visualized. 4. The mitral valve is grossly normal. 5. The Pulmonary valve was not visualized.iogram 04/23/2019:  Patient Profile     58 y.o. male with a history of severe autism with selective mutism, schizophrenic disorder, diabetes type 2, chronic kidney disease, and chronic suprapubic catheter for urinary strictures.  He is now being managed for acute hypoxic respiratory failure, sepsis, and acute on chronic kidney injury. Cardiology is consulted secondary to PSVT versus PAF.  Assessment & Plan    PSVT and possibly brief episodes of atrial fibrillation.  This has settled down significantly on IV amiodarone.  He is on Neo-Synephrine for blood pressure support.  At this point no room for addition of beta-blocker or calcium channel blocker.  Signed, Rozann Lesches, MD  04/26/2019, 8:46 AM

## 2019-04-26 NOTE — Progress Notes (Addendum)
Called [facility], informed nurse Gretchen no orders for am cxr noted, Elzie Rings will notify md if cxr needed this am

## 2019-04-26 NOTE — Progress Notes (Addendum)
[facility] Progress Note  Name: [name] MRN: [id number] DOB: 10-29-1960  Chief Complaint:  Left heel gangrene  Subjective:  He was intubated yesterday.  He has been tachycardic and febrile overnight.  Urine output decreased to 330 mL over 8/22 and 46m overnight.  He has been on 170 mcg/kg/min of phenylephrine.  Still on 100% FIO2 and is on 12 PEEP.  Has been sinus rhythm overnight HR mostly 60's per nursing.     Review of systems:  Unable to obtain secondary to intubation    Intake/Output Summary (Last 24 hours) at 04/26/2019 0511 Last data filed at 04/26/2019 0100 Gross per 24 hour  Intake 2961.09 ml  Output 430 ml  Net 2531.09 ml    Vitals:  Vitals:   04/25/19 2355 04/26/19 0020 04/26/19 0341 04/26/19 0343  BP:   (!) 72/52   Pulse:   96   Resp:   (!) 21   Temp:  (!) 96.4 F (35.8 C)  (!) 101.9 F (38.8 C)  TempSrc:  Axillary  Axillary  SpO2: 100%  100%   Weight:      Height:         Physical Exam:  General adult male in bed intubated HEENT normocephalic atraumatic  Lungs rhonchi and coarse breath sounds bilaterally   Heart RRR no rub  Abdomen soft nondistended/obese habitus Extremities 1-2+ edema  GU - suprapubic catheter; scrotal edema  Neuro not on continuous sedation; precedex is off   Medications reviewed   Labs:  BMP Latest Ref Rng & Units 04/26/2019 04/25/2019 04/25/2019  Glucose 70 - 99 mg/dL 94 99 -  BUN 6 - 20 mg/dL 53(H) 51(H) -  Creatinine 0.61 - 1.24 mg/dL 4.31(H) 3.95(H) -  Sodium 135 - 145 mmol/L 143 143 145  Potassium 3.5 - 5.1 mmol/L 4.2 3.6 3.5  Chloride 98 - 111 mmol/L 114(H) 113(H) -  CO2 22 - 32 mmol/L 19(L) 20(L) -  Calcium 8.9 - 10.3 mg/dL 7.5(L) 7.6(L) -     Assessment/Plan:  Patient with autism and under guardianship presented with left heel gangrene and ultimately s/p AKA.  He has received IV antibiotics including vancomycin.  Transferred to the unit today for resp distress requiring bipap.  Also noted  with AKI.  Does have CKD at baseline it appears per labs and imaging.  Was referred to Dr. BHinda Lenisfor anemia management per his guardian; she thought he had CKD stage II.  He was started on a bicarb gtt earlier this afternoon as well.  Renal ultrasound was without hydro but kidneys noted to be smaller than prior exam in 2013.  Urine culture on antibiotics grew yeast.    # AKI   - progressing to oligoanuric.  S/p multifactorial ischemic and pre-renal insults in the setting of hypotension/tachycardia and sepsis - Spoke with guardian on 8/22 and she would want for him to have renal replacement therapy if indicated  - With worsening renal failure, progression to oligoanuria, and difficulty oxygenation will discuss with pulm and offer CRRT   # Metabolic acidosis - s/p bicarb gtt now off   # CKD stage III  - note 02/2012 with Cr 1.63 and 02/24/19 with Cr 2.6, eGFR 30 - baseline Cr appears to be in the 2's recently.  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 Hinda Lenis as an outpatient who had been managing anemia per report  - s/p PRBC's - some component may be due to renal failure as well   [name], MD 04/26/2019 5:11 AM    Spoke with pulm/critical care.  They are performing additional imaging today to rule out occult proceess and are speaking with ID about the yeast in the urine.  Lasix IV once now and transition to CRRT later today after access placement.  Spoke with his guardian who confirmed she does wish to pursue renal replacement therapy.    [name] 04/26/2019 8:25 AM

## 2019-04-26 NOTE — Progress Notes (Signed)
Called elink, informed nurse Elta Guadeloupe pt remains on 100% fio2 since intubation. Elta Guadeloupe will f/u with md to address if abgs or changes needed.

## 2019-04-26 NOTE — Procedures (Signed)
Suprapubic cath change   The existing supra pubic catheter was removed, the site was prepped with sterile Betadine.  Following this a sterile 2 French Foley catheter was inserted into the suprapubic stoma.  The balloon was inflated with 10 cc of sterile water, site was dressed  Patient tolerated well  Erick Colace ACNP-BC [place] Pager # [phone] OR # [phone] if no answer

## 2019-04-26 NOTE — Progress Notes (Signed)
Patient transported to CT and returned to [id number] without complications. Vitals stable. RT will continue to monitor.

## 2019-04-26 NOTE — Procedures (Signed)
Hemodialysis Catheter Insertion Procedure Note [name] [id number] 06-01-1961  Procedure: Insertion of Hemodialysis Catheter Indications: Dialysis Access   Procedure Details Consent: Risks of procedure as well as the alternatives and risks of each were explained to the (patient/caregiver).  Consent for procedure obtained. Time Out: Verified patient identification, verified procedure, site/side was marked, verified correct patient position, special equipment/implants available, medications/allergies/relevent history reviewed, required imaging and test results available.  Performed  Maximum sterile technique was used including antiseptics, cap, gloves, gown, hand hygiene, mask and sheet. Skin prep: Chlorhexidine; local anesthetic administered Triple lumen hemodialysis catheter was inserted into left internal jugular vein using the Seldinger technique. Real time [place] used to ID and cannulate the vessel  Evaluation Blood flow good Complications: No apparent complications Patient did tolerate procedure well. Chest X-ray ordered to verify placement.  CXR: pending.   [name] 04/26/2019   Erick Colace ACNP-BC [place] Pager # [phone] OR # [phone] if no answer

## 2019-04-26 NOTE — Progress Notes (Signed)
eLink Physician-Brief Progress Note Patient Name: [name] DOB: [birth year] MRN: [id number]   Date of Service  04/26/2019  HPI/Events of Note  Patient intubated and ventilated. Request for ABG.   eICU Interventions  Will order ABG.     Intervention Category Major Interventions: Respiratory failure - evaluation and management  [name] 04/26/2019, 6:55 AM

## 2019-04-26 NOTE — Progress Notes (Signed)
Informed by elink  Nurse Gretchen no need for cxr this am per md

## 2019-04-26 NOTE — Procedures (Signed)
Arterial Catheter Insertion Procedure Note Robroy Chevez [id number] 1960/11/15  Procedure: Insertion of Arterial Catheter  Indications: Blood pressure monitoring  Procedure Details Consent: emergent  Time Out: Verified patient identification, verified procedure, site/side was marked, verified correct patient position, special equipment/implants available, medications/allergies/relevent history reviewed, required imaging and test results available.  Performed  Maximum sterile technique was used including antiseptics, cap, gloves, gown, hand hygiene, mask and sheet. Skin prep: Chlorhexidine; local anesthetic administered 20 gauge catheter was inserted into right radial artery using the Seldinger technique. ULTRASOUND GUIDANCE USED: YES Evaluation Blood flow good; BP tracing good. Complications: No apparent complications.   [name] 04/26/2019

## 2019-04-26 NOTE — Progress Notes (Signed)
RT attempted to obtain ABG without success. RN made aware. RT will continue to monitor.

## 2019-04-27 ENCOUNTER — Inpatient Hospital Stay (HOSPITAL_COMMUNITY): Payer: Medicaid Other

## 2019-04-27 ENCOUNTER — Encounter (HOSPITAL_COMMUNITY): Payer: Self-pay | Admitting: Neurology

## 2019-04-27 DIAGNOSIS — J954 Chemical pneumonitis due to anesthesia: Secondary | ICD-10-CM

## 2019-04-27 DIAGNOSIS — R569 Unspecified convulsions: Secondary | ICD-10-CM

## 2019-04-27 DIAGNOSIS — R680 Hypothermia, not associated with low environmental temperature: Secondary | ICD-10-CM

## 2019-04-27 DIAGNOSIS — Z9911 Dependence on respirator [ventilator] status: Secondary | ICD-10-CM

## 2019-04-27 LAB — RENAL FUNCTION PANEL
Albumin: 1.2 g/dL — ABNORMAL LOW (ref 3.5–5.0)
Albumin: 1.1 g/dL — ABNORMAL LOW (ref 3.5–5.0)
Anion gap: 12 (ref 5–15)
Anion gap: 11 (ref 5–15)
BUN: 33 mg/dL — ABNORMAL HIGH (ref 6–20)
BUN: 24 mg/dL — ABNORMAL HIGH (ref 6–20)
CO2: 18 mmol/L — ABNORMAL LOW (ref 22–32)
CO2: 19 mmol/L — ABNORMAL LOW (ref 22–32)
Calcium: 7.2 mg/dL — ABNORMAL LOW (ref 8.9–10.3)
Calcium: 7.2 mg/dL — ABNORMAL LOW (ref 8.9–10.3)
Chloride: 109 mmol/L (ref 98–111)
Chloride: 106 mmol/L (ref 98–111)
Creatinine, Ser: 2.44 mg/dL — ABNORMAL HIGH (ref 0.61–1.24)
Creatinine, Ser: 1.76 mg/dL — ABNORMAL HIGH (ref 0.61–1.24)
GFR calc Af Amer: 33 mL/min — ABNORMAL LOW (ref >60)
GFR calc Af Amer: 49 mL/min — ABNORMAL LOW (ref >60)
GFR calc non Af Amer: 28 mL/min — ABNORMAL LOW (ref >60)
GFR calc non Af Amer: 42 mL/min — ABNORMAL LOW (ref >60)
Glucose, Bld: 86 mg/dL (ref 70–99)
Glucose, Bld: 121 mg/dL — ABNORMAL HIGH (ref 70–99)
Phosphorus: 2.7 mg/dL (ref 2.5–4.6)
Phosphorus: 1.9 mg/dL — ABNORMAL LOW (ref 2.5–4.6)
Potassium: 3.9 mmol/L (ref 3.5–5.1)
Potassium: 4 mmol/L (ref 3.5–5.1)
Sodium: 139 mmol/L (ref 135–145)
Sodium: 136 mmol/L (ref 135–145)

## 2019-04-27 LAB — LACTIC ACID, PLASMA
Lactic Acid, Venous: 3.7 mmol/L (ref 0.5–1.9)
Lactic Acid, Venous: 4.8 mmol/L (ref 0.5–1.9)

## 2019-04-27 LAB — CBC WITH DIFFERENTIAL/PLATELET
Abs Immature Granulocytes: 0.13 10*3/uL — ABNORMAL HIGH (ref 0.00–0.07)
Basophils Absolute: 0.1 10*3/uL (ref 0.0–0.1)
Basophils Relative: 0 %
Eosinophils Absolute: 0.3 10*3/uL (ref 0.0–0.5)
Eosinophils Relative: 2 %
HCT: 27.3 % — ABNORMAL LOW (ref 39.0–52.0)
Hemoglobin: 8.8 g/dL — ABNORMAL LOW (ref 13.0–17.0)
Immature Granulocytes: 1 %
Lymphocytes Relative: 4 %
Lymphs Abs: 0.7 10*3/uL (ref 0.7–4.0)
MCH: 25.9 pg — ABNORMAL LOW (ref 26.0–34.0)
MCHC: 32.2 g/dL (ref 30.0–36.0)
MCV: 80.3 fL (ref 80.0–100.0)
Monocytes Absolute: 0.1 10*3/uL (ref 0.1–1.0)
Monocytes Relative: 1 %
Neutro Abs: 16.4 10*3/uL — ABNORMAL HIGH (ref 1.7–7.7)
Neutrophils Relative %: 92 %
Platelets: 270 10*3/uL (ref 150–400)
RBC: 3.4 MIL/uL — ABNORMAL LOW (ref 4.22–5.81)
RDW: 18.7 % — ABNORMAL HIGH (ref 11.5–15.5)
WBC: 17.7 10*3/uL — ABNORMAL HIGH (ref 4.0–10.5)
nRBC: 0.3 % — ABNORMAL HIGH (ref 0.0–0.2)

## 2019-04-27 LAB — GLUCOSE, CAPILLARY
Glucose-Capillary: 111 mg/dL — ABNORMAL HIGH (ref 70–99)
Glucose-Capillary: 82 mg/dL (ref 70–99)
Glucose-Capillary: 83 mg/dL (ref 70–99)
Glucose-Capillary: 91 mg/dL (ref 70–99)
Glucose-Capillary: 93 mg/dL (ref 70–99)
Glucose-Capillary: 96 mg/dL (ref 70–99)

## 2019-04-27 LAB — LEGIONELLA PNEUMOPHILA SEROGP 1 UR AG: L. pneumophila Serogp 1 Ur Ag: NEGATIVE

## 2019-04-27 LAB — MAGNESIUM
Magnesium: 2.2 mg/dL (ref 1.7–2.4)
Magnesium: 2.3 mg/dL (ref 1.7–2.4)

## 2019-04-27 MED ORDER — VITAL AF 1.2 CAL PO LIQD
1000.0000 mL | ORAL | Status: DC
  Administered 2019-04-27 – 2019-04-28 (×2): 1000 mL

## 2019-04-27 MED ORDER — MIDAZOLAM HCL 2 MG/2ML IJ SOLN
INTRAMUSCULAR | Status: AC
  Administered 2019-04-27: 12:00:00 2 mg via INTRAVENOUS
  Filled 2019-04-27: qty 2

## 2019-04-27 MED ORDER — MIDAZOLAM HCL 2 MG/2ML IJ SOLN
2.0000 mg | Freq: Once | INTRAMUSCULAR | Status: AC
  Administered 2019-04-27: 12:00:00 2 mg via INTRAVENOUS

## 2019-04-27 MED ORDER — PRO-STAT SUGAR FREE PO LIQD
30.0000 mL | Freq: Three times a day (TID) | ORAL | Status: DC
  Administered 2019-04-27 – 2019-04-28 (×4): 30 mL
  Filled 2019-04-27 (×4): qty 30

## 2019-04-27 MED ORDER — ZIPRASIDONE HCL 40 MG PO CAPS
40.0000 mg | ORAL_CAPSULE | Freq: Every day | ORAL | Status: DC
  Administered 2019-04-27: 22:00:00 40 mg via ORAL
  Filled 2019-04-27 (×2): qty 1

## 2019-04-27 NOTE — Procedures (Signed)
History: 58 yo M with gaze deviation  Sedation: None  Technique: This is a 21 channel routine scalp EEG performed at the bedside with bipolar and monopolar montages arranged in accordance to the international 10/20 system of electrode placement. One channel was dedicated to EKG recording.    Background: The background consists of generalized irregular delta ad theta range smoothly contoured slow activity. No clear PDR is seen. No epileptiform discharges are seen.  Photic stimulation: Physiologic driving is ot performed  EEG Abnormalities: 1) Generalized irregular slow activity.  Clinical Interpretation: This EEG is consistent with generalized non-specific cerebral dysfunction(encephalopathy) . Please note that lack of epileptiform activity on EEG does not preclude the possibility of epilepsy.    Roland Rack, MD [facility] [phone]  If 7pm- 7am, please page neurology on call as listed in [place].

## 2019-04-27 NOTE — Progress Notes (Signed)
   VASCULAR SURGERY ASSESSMENT & PLAN:   POD 6S/P: LEFT TGP:QDIYMEB well. Not a source of fevers.   SUBJECTIVE:   On vent.   PHYSICAL EXAM:   Vitals:   04/27/19 0600 04/27/19 0700 04/27/19 0739 04/27/19 0800  BP: (!) 88/69 (!) 114/56  108/61  Pulse: 80 (!) 101  (!) 120  Resp: (!) 21 20  (!) 21  Temp:   (!) 97.2 F (36.2 C)   TempSrc:   Oral   SpO2: 96% 99%  97%  Weight:      Height:       Dressing on left AKA is dry it was just changed this AM and reportedly looked fine.   LABS:   Lab Results  Component Value Date   WBC 17.7 (H) 04/27/2019   HGB 8.8 (L) 04/27/2019   HCT 27.3 (L) 04/27/2019   MCV 80.3 04/27/2019   PLT 270 04/27/2019   Lab Results  Component Value Date   CREATININE 2.44 (H) 04/27/2019   Lab Results  Component Value Date   INR 1.24 02/16/2012   CBG (last 3)  Recent Labs    04/26/19 2359 04/27/19 0408 04/27/19 0736  GLUCAP 83 91 82    PROBLEM LIST:    Principal Problem:   Aspiration pneumonia ([place]) Active Problems:   Intellectual disability   Acute kidney injury superimposed on CKD (HCC)   Chronic indwelling Foley catheter   Diabetes mellitus out of control (HCC)   Anemia, blood loss   Leukocytosis   History of seizure   Gangrene (HCC)   Pressure injury of skin   Folate deficiency   Gas gangrene of foot (HCC)   Postoperative fever   AKI (acute kidney injury) ([place])   CURRENT MEDS:   . sodium chloride   Intravenous Once  . chlorhexidine gluconate (MEDLINE KIT)  15 mL Mouth Rinse BID  . Chlorhexidine Gluconate Cloth  6 each Topical Q0600  . docusate  50 mg Per Tube Daily  . feeding supplement (PRO-STAT SUGAR FREE 64)  30 mL Per Tube TID WC  . folic acid  1 mg Per Tube Daily  . heparin  5,000 Units Subcutaneous Q8H  . insulin aspart  0-9 Units Subcutaneous QID  . insulin aspart  2 Units Subcutaneous TID WC  . insulin detemir  7 Units Subcutaneous Daily  . mouth rinse  15 mL Mouth Rinse 10 times per day  . mirtazapine  15  mg Per Tube QHS  . multivitamin  15 mL Oral Daily  . nutrition supplement (JUVEN)  1 packet Per Tube BID BM  . pantoprazole (PROTONIX) IV  40 mg Intravenous Q24H  . sertraline  200 mg Per Tube Daily  . sodium chloride flush  3 mL Intravenous Q12H  . ziprasidone  40 mg Intramuscular QHS    [name] Beeper: [phone] Office: [phone] 04/27/2019

## 2019-04-27 NOTE — Progress Notes (Signed)
[facility]    NEPHROLOGY PROGRESS NOTE  SUBJECTIVE:  Intubated 8/22, currently sedated. Unable to obtain ROS. Switched to CRRT yesterday due to developing oliguria and worsening respiratory status.     OBJECTIVE:  Vitals:   04/27/19 0739 04/27/19 0800  BP:  108/61  Pulse:  (!) 120  Resp:  (!) 21  Temp: (!) 97.2 F (36.2 C)   SpO2:  97%    Intake/Output Summary (Last 24 hours) at 04/27/2019 6144 Last data filed at 04/27/2019 0800 Gross per 24 hour  Intake 3477.71 ml  Output 2571 ml  Net 906.71 ml      General:  Acutely ill appearing, sedated HEENT: ETT tube in place, no JVD CV:  RRR, no m/r/g; no LE edema Lungs:  Decreased breath sounds, +wheeze bilaterally Abd:  Soft, NTTP, non-distended GU:  Suprapubic catheter, minimal urine output, on CRRT Extremities:  Left AKA, dressing in place  Skin:  No skin rash, c/d/i   MEDICATIONS:  . sodium chloride   Intravenous Once  . chlorhexidine gluconate (MEDLINE KIT)  15 mL Mouth Rinse BID  . Chlorhexidine Gluconate Cloth  6 each Topical Q0600  . docusate  50 mg Per Tube Daily  . feeding supplement (PRO-STAT SUGAR FREE 64)  30 mL Per Tube TID WC  . folic acid  1 mg Per Tube Daily  . heparin  5,000 Units Subcutaneous Q8H  . insulin aspart  0-9 Units Subcutaneous QID  . insulin aspart  2 Units Subcutaneous TID WC  . insulin detemir  7 Units Subcutaneous Daily  . mouth rinse  15 mL Mouth Rinse 10 times per day  . mirtazapine  15 mg Per Tube QHS  . multivitamin  15 mL Oral Daily  . nutrition supplement (JUVEN)  1 packet Per Tube BID BM  . pantoprazole (PROTONIX) IV  40 mg Intravenous Q24H  . sertraline  200 mg Per Tube Daily  . sodium chloride flush  3 mL Intravenous Q12H  . sterile water (preservative free)      . ziprasidone  40 mg Intramuscular QHS       LABS:   CBC Latest Ref Rng & Units 04/27/2019 04/26/2019 04/26/2019  WBC 4.0 - 10.5 K/uL 17.7(H) - -  Hemoglobin 13.0 - 17.0 g/dL 8.8(L) 8.2(L) 8.5(L)   Hematocrit 39.0 - 52.0 % 27.3(L) 24.0(L) 25.0(L)  Platelets 150 - 400 K/uL 270 - -    CMP Latest Ref Rng & Units 04/27/2019 04/26/2019 04/26/2019  Glucose 70 - 99 mg/dL 86 139(H) -  BUN 6 - 20 mg/dL 33(H) 54(H) -  Creatinine 0.61 - 1.24 mg/dL 2.44(H) 3.71(H) -  Sodium 135 - 145 mmol/L 139 143 146(H)  Potassium 3.5 - 5.1 mmol/L 3.9 3.9 3.9  Chloride 98 - 111 mmol/L 109 113(H) -  CO2 22 - 32 mmol/L 18(L) 17(L) -  Calcium 8.9 - 10.3 mg/dL 7.2(L) 7.3(L) -  Total Protein 6.5 - 8.1 g/dL - - -  Total Bilirubin 0.3 - 1.2 mg/dL - - -  Alkaline Phos 38 - 126 U/L - - -  AST 15 - 41 U/L - - -  ALT 0 - 44 U/L - - -    Lab Results  Component Value Date   CALCIUM 7.2 (L) 04/27/2019   CAION 1.10 (L) 04/26/2019   PHOS 2.7 04/27/2019       Component Value Date/Time   COLORURINE YELLOW 04/26/2019 1205   APPEARANCEUR CLOUDY (A) 04/26/2019 1205   LABSPEC 1.014 04/26/2019 1205  PHURINE 5.0 04/26/2019 1205   GLUCOSEU NEGATIVE 04/26/2019 1205   HGBUR MODERATE (A) 04/26/2019 1205   BILIRUBINUR NEGATIVE 04/26/2019 1205   [place] 04/26/2019 1205   PROTEINUR 100 (A) 04/26/2019 1205   UROBILINOGEN 0.2 01/21/2012 1250   NITRITE NEGATIVE 04/26/2019 1205   LEUKOCYTESUR LARGE (A) 04/26/2019 1205      Component Value Date/Time   PHART 7.237 (L) 04/26/2019 1524   PCO2ART 41.1 04/26/2019 1524   PO2ART 66.0 (L) 04/26/2019 1524   HCO3 17.6 (L) 04/26/2019 1524   TCO2 19 (L) 04/26/2019 1524   ACIDBASEDEF 9.0 (H) 04/26/2019 1524   O2SAT 90.0 04/26/2019 1524       Component Value Date/Time   IRON 13 (L) 04/14/2019 1756   TIBC 162 (L) 05/01/2019 1756   FERRITIN 1,671 (H) 04/08/2019 1756   IRONPCTSAT 8 (L) 04/14/2019 1756       ASSESSMENT/PLAN:     58yo non-verbal M with autism, CKD stage II, chronic suprapubic catheter for urethral strictures under guardianship admitted for acute anemia after planned CTA for left heel ulcer. Developed gangrene requiring AKA. Received vancomycin. Developed  AKI and worsening respiratory distress with transfer to ICU. Renal US showed worsening of renal atrophy.   Acute Kidney Injury Baseline CKD Stage II with Cr thought to be ~2.5. AKI multifactorial with ischemic and pre-renal insults including hypotension, sepsis, and medications progressing to anuria yesterday, CRRT was started. Currently requiring levo and vasopressin 2/2 hypotension.   - cont. CRRT.  - am labs  Metabolic Acidosis Improving.   CKD Stage III  Most recent prior to admission Cr 2.6, eGFR 30 02/24/19 while at rehab but no trend available. 2.16 at admission.   Chronic Suprapubic Catheter Possible candidal cystitis with small amount yeast cultured. Diflucan started yesterday per ID.   Acute Hypoxic Respiratory Failure 2/2 Aspiration Pneumonia Intubated yesterday for respiratory insufficiency. On full support with O2 at 90%.   Septic Shock 2/2 Pneumonia Candidal Cystitis  Curretnly requiring levo and vasopressin, neo stopped. Switched yesterday from flagyl/cefepime to meropenam per ID with diflucan started yesterday for yeast in suprapubic catheter. Resp culture 8/23 reincubated, blood cx 8/21 with NGTD.   Arrythmia Sinus tach with atrial tachycardia and some PACs which started shortly after beginning CRRT. Cardiology consulted and plan to continue amiodarone.   Anemia 2/2 ABL and Chronic Disease  S/p prbcs. Acute from recent surgery and chronic 2/2 CKD and IDA s/p iron infusion prior to admission. Hemoglobin currently stable.    Marty Heck, DO 04/27/2019, 8:22 AM Pager: [phone]

## 2019-04-27 NOTE — Progress Notes (Signed)
PT Cancellation Note  Patient Details Name: [name] MRN: [id number] DOB: 25-Jan-1961   Cancelled Treatment:    Reason Eval/Treat Not Completed: Patient not medically ready   Noted medical decline since initial limited PT eval on 8/19;   Will hold today, and check back for medical readiness over next few days;   Roney Marion, [place]  Acute [facility] Services Pager [phone] Office [place] 04/27/2019, 8:56 AM

## 2019-04-27 NOTE — Progress Notes (Signed)
Progress Note  Patient Name: [name] Date of Encounter: 04/27/2019  Primary Cardiologist: Kirk Ruths, MD   Subjective   Sedate on ventilator, ectopic beats noted  Inpatient Medications    Scheduled Meds: . sodium chloride   Intravenous Once  . chlorhexidine gluconate (MEDLINE KIT)  15 mL Mouth Rinse BID  . Chlorhexidine Gluconate Cloth  6 each Topical Q0600  . docusate  50 mg Per Tube Daily  . feeding supplement (PRO-STAT SUGAR FREE 64)  30 mL Per Tube TID WC  . folic acid  1 mg Per Tube Daily  . heparin  5,000 Units Subcutaneous Q8H  . insulin aspart  0-9 Units Subcutaneous QID  . insulin aspart  2 Units Subcutaneous TID WC  . insulin detemir  7 Units Subcutaneous Daily  . mouth rinse  15 mL Mouth Rinse 10 times per day  . mirtazapine  15 mg Per Tube QHS  . multivitamin  15 mL Oral Daily  . nutrition supplement (JUVEN)  1 packet Per Tube BID BM  . pantoprazole (PROTONIX) IV  40 mg Intravenous Q24H  . sertraline  200 mg Per Tube Daily  . sodium chloride flush  3 mL Intravenous Q12H  . ziprasidone  40 mg Intramuscular QHS   Continuous Infusions: .  prismasol BGK 4/2.5 400 mL/hr at 04/27/19 0301  .  prismasol BGK 4/2.5 300 mL/hr at 04/27/19 0750  . sodium chloride Stopped (04/27/19 0936)  . amiodarone 30 mg/hr (04/27/19 1000)  . dexmedetomidine (PRECEDEX) IV infusion Stopped (04/26/19 0358)  . feeding supplement (VITAL HIGH PROTEIN) 1,000 mL (04/26/19 1517)  . fluconazole (DIFLUCAN) IV Stopped (04/26/19 1157)  . levETIRAcetam Stopped (04/27/19 0911)  . magnesium sulfate bolus IVPB    . meropenem (MERREM) IV 200 mL/hr at 04/27/19 1000  . norepinephrine (LEVOPHED) Adult infusion 23 mcg/min (04/27/19 1000)  . phenylephrine (NEO-SYNEPHRINE) Adult infusion Stopped (04/27/19 0305)  . prismasol BGK 4/2.5 1,800 mL/hr at 04/27/19 0740  . vasopressin (PITRESSIN) infusion - *FOR SHOCK* 0.04 Units/min (04/27/19 1000)   PRN Meds: sodium chloride, oxyCODONE **AND**  acetaminophen, acetaminophen **OR** acetaminophen, bisacodyl, heparin, ipratropium-albuterol, LORazepam, magnesium sulfate bolus IVPB, metoprolol tartrate, morphine injection, ondansetron **OR** ondansetron (ZOFRAN) IV, oxyCODONE-acetaminophen, sennosides   Vital Signs    Vitals:   04/27/19 0700 04/27/19 0739 04/27/19 0800 04/27/19 0900  BP: (!) 114/56  108/61 98/65  Pulse: (!) 101  (!) 120 90  Resp: 20  (!) 21 (!) 21  Temp:  (!) 97.2 F (36.2 C)    TempSrc:  Oral    SpO2: 99%  97% 95%  Weight:      Height:        Intake/Output Summary (Last 24 hours) at 04/27/2019 1026 Last data filed at 04/27/2019 1000 Gross per 24 hour  Intake 3623.23 ml  Output 2876 ml  Net 747.23 ml   Last 3 Weights 04/27/2019 04/24/2019 04/23/2019  Weight (lbs) 174 lb 6.1 oz 180 lb 189 lb  Weight (kg) 79.1 kg 81.647 kg 85.73 kg      Telemetry    Various rhythms noted.  Clearly sinus rhythm at points with PACs.  There is also what looks like paroxysmal atrial tachycardia at times or multifocal atrial tachycardia.  Atrial fibrillation during certain periods of time cannot be excluded.- Personally Reviewed  ECG    Most recent EKG has some baseline artifact looks like possible atrial fibrillation- Personally Reviewed  Physical Exam   GEN:  Sedate.   Neck: No JVD Cardiac: Irregular no murmurs,  rubs, or gallops.  Respiratory: Vent noise bilaterally. GI: Soft, nontender, non-distended  MS: 1+ edema; No deformity. Neuro:   Sedate Psych: Sedate  Labs    High Sensitivity Troponin:  No results for input(s): TROPONINIHS in the last 720 hours.    Cardiac EnzymesNo results for input(s): TROPONINI in the last 168 hours. No results for input(s): TROPIPOC in the last 168 hours.   Chemistry Recent Labs  Lab 04/25/19 1853 04/26/19 0400  04/26/19 1524 04/26/19 1728 04/27/19 0502  NA 143 143   < > 146* 143 139  K 3.6 4.2   < > 3.9 3.9 3.9  CL 113* 114*  --   --  113* 109  CO2 20* 19*  --   --  17* 18*   GLUCOSE 99 94  --   --  139* 86  BUN 51* 53*  --   --  54* 33*  CREATININE 3.95* 4.31*  --   --  3.71* 2.44*  CALCIUM 7.6* 7.5*  --   --  7.3* 7.2*  PROT 5.1*  --   --   --   --   --   ALBUMIN 1.3*  --   --   --  1.2* 1.2*  AST 102*  --   --   --   --   --   ALT 21  --   --   --   --   --   ALKPHOS 136*  --   --   --   --   --   BILITOT 0.7  --   --   --   --   --   GFRNONAA 16* 14*  --   --  17* 28*  GFRAA 18* 16*  --   --  20* 33*  ANIONGAP 10 10  --   --  13 12   < > = values in this interval not displayed.     Hematology Recent Labs  Lab 04/25/19 1853 04/26/19 0400 04/26/19 1359 04/26/19 1524 04/27/19 0502  WBC 20.8* 20.9*  --   --  17.7*  RBC 3.02* 3.29*  --   --  3.40*  HGB 7.8* 8.5* 8.5* 8.2* 8.8*  HCT 24.1* 26.6* 25.0* 24.0* 27.3*  MCV 79.8* 80.9  --   --  80.3  MCH 25.8* 25.8*  --   --  25.9*  MCHC 32.4 32.0  --   --  32.2  RDW 18.2* 18.6*  --   --  18.7*  PLT 330 371  --   --  270    BNP Recent Labs  Lab 04/24/19 0741  BNP 1,044.4*     DDimer No results for input(s): DDIMER in the last 168 hours.   Radiology    Ct Abdomen Pelvis Wo Contrast  Result Date: 04/26/2019 CLINICAL DATA:  Sepsis EXAM: CT CHEST, ABDOMEN AND PELVIS WITHOUT CONTRAST TECHNIQUE: Multidetector CT imaging of the chest, abdomen and pelvis was performed following the standard protocol without IV contrast. COMPARISON:  None. FINDINGS: CT CHEST FINDINGS Cardiovascular: Heart is normal size. Calcifications in the aorta. No aneurysm. Mediastinum/Nodes: Mildly prominent mediastinal lymph nodes, likely reactive. No axillary adenopathy. Difficult to assess the hila without intravenous contrast. Lungs/Pleura: Moderate bilateral pleural effusions. Extensive bilateral airspace disease. Air bronchograms bilaterally, right greater than left. Findings most compatible with pneumonia. Musculoskeletal: Chest wall soft tissues are unremarkable. No acute bony abnormality. CT ABDOMEN PELVIS FINDINGS  Hepatobiliary: Layering gallstones within the gallbladder. No focal hepatic abnormality. Pancreas:  No focal abnormality or ductal dilatation. Spleen: No focal abnormality.  Normal size. Adrenals/Urinary Tract: No visible renal or adrenal mass. Suprapubic catheter present within the bladder which is decompressed. No hydronephrosis. Stomach/Bowel: Stomach, large and small bowel grossly unremarkable. Vascular/Lymphatic: Aortic atherosclerosis. No enlarged abdominal or pelvic lymph nodes. Reproductive: No visible focal abnormality. Other: Perihepatic and perisplenic ascites.  No free air. Musculoskeletal: No acute bony abnormality. IMPRESSION: Dense bilateral airspace consolidations most compatible with pneumonia. Moderate bilateral pleural effusions. Cholelithiasis. Mild perihepatic and perisplenic ascites. Aortic atherosclerosis. Electronically Signed   By: Rolm Baptise M.D.   On: 04/26/2019 15:15   Ct Chest Wo Contrast  Result Date: 04/26/2019 CLINICAL DATA:  Sepsis EXAM: CT CHEST, ABDOMEN AND PELVIS WITHOUT CONTRAST TECHNIQUE: Multidetector CT imaging of the chest, abdomen and pelvis was performed following the standard protocol without IV contrast. COMPARISON:  None. FINDINGS: CT CHEST FINDINGS Cardiovascular: Heart is normal size. Calcifications in the aorta. No aneurysm. Mediastinum/Nodes: Mildly prominent mediastinal lymph nodes, likely reactive. No axillary adenopathy. Difficult to assess the hila without intravenous contrast. Lungs/Pleura: Moderate bilateral pleural effusions. Extensive bilateral airspace disease. Air bronchograms bilaterally, right greater than left. Findings most compatible with pneumonia. Musculoskeletal: Chest wall soft tissues are unremarkable. No acute bony abnormality. CT ABDOMEN PELVIS FINDINGS Hepatobiliary: Layering gallstones within the gallbladder. No focal hepatic abnormality. Pancreas: No focal abnormality or ductal dilatation. Spleen: No focal abnormality.  Normal size.  Adrenals/Urinary Tract: No visible renal or adrenal mass. Suprapubic catheter present within the bladder which is decompressed. No hydronephrosis. Stomach/Bowel: Stomach, large and small bowel grossly unremarkable. Vascular/Lymphatic: Aortic atherosclerosis. No enlarged abdominal or pelvic lymph nodes. Reproductive: No visible focal abnormality. Other: Perihepatic and perisplenic ascites.  No free air. Musculoskeletal: No acute bony abnormality. IMPRESSION: Dense bilateral airspace consolidations most compatible with pneumonia. Moderate bilateral pleural effusions. Cholelithiasis. Mild perihepatic and perisplenic ascites. Aortic atherosclerosis. Electronically Signed   By: Rolm Baptise M.D.   On: 04/26/2019 15:15   Dg Chest Port 1 View  Result Date: 04/26/2019 CLINICAL DATA:  Hemodialysis catheter placement. EXAM: PORTABLE CHEST 1 VIEW COMPARISON:  Earlier same day FINDINGS: 1157 hours. Diffuse bilateral airspace disease again noted with volume loss right hemithorax. Endotracheal tube tip is 6.5 cm above the base of the carina. Right IJ central line tip overlies the mid SVC level. Left IJ dialysis catheter is new in the interval with tip overlying the mid-distal SVC level. No evidence for left pneumothorax although ventilator tubing overlies the left apex. The NG tube passes into the stomach although the distal tip position is not included on the film. The visualized bony structures of the thorax are intact. Telemetry leads overlie the chest. IMPRESSION: 1. New left IJ dialysis catheter tip overlies the mid to distal SVC level. No pneumothorax. 2. Similar appearance of volume loss in the right hemithorax with diffuse bilateral airspace disease. Electronically Signed   By: Misty Stanley M.D.   On: 04/26/2019 12:32   Dg Chest Port 1 View  Result Date: 04/26/2019 CLINICAL DATA:  Patient status post ET tube placement. EXAM: PORTABLE CHEST 1 VIEW COMPARISON:  Chest radiograph 04/25/2019 FINDINGS: ETT terminates  in the mid trachea. Enteric tube courses anterior to the diaphragm. Right IJ central venous catheter tip projects over the superior vena cava. Monitoring leads overlie the patient. Stable cardiac and mediastinal contours. Similar-appearing bilateral airspace opacities. No definite pleural effusion or pneumothorax. IMPRESSION: Similar-appearing diffuse bilateral airspace consolidation. ET tube mid trachea. Electronically Signed   By: Polly Cobia.D.  On: 04/26/2019 10:40   Portable Chest X-ray  Result Date: 04/25/2019 CLINICAL DATA:  ET tube and NG tube placement EXAM: PORTABLE CHEST 1 VIEW COMPARISON:  04/25/2019 FINDINGS: Interval placement of right central line with the tip in the SVC. No pneumothorax endotracheal tube tip is 7.5 cm above the carina. NG tube enters the stomach. Bilateral airspace opacities are noted, right greater than left. Small right effusion and moderate left effusion. Heart is normal size. Airspace disease slightly worsened since prior study. IMPRESSION: Support devices as above. Diffuse bilateral airspace disease, right greater than left, increasing since prior study. Small right effusion and moderate left effusion. Electronically Signed   By: Rolm Baptise M.D.   On: 04/25/2019 17:32   Dg Abd Portable 1v  Result Date: 04/25/2019 CLINICAL DATA:  ETT and NG tube placement EXAM: PORTABLE ABDOMEN - 1 VIEW COMPARISON:  04/15/2019 FINDINGS: Enteric tube terminates in the proximal gastric body. Residual contrast within visualized bowel. IMPRESSION: Enteric tube terminates in the proximal gastric body. Electronically Signed   By: Julian Hy M.D.   On: 04/25/2019 17:38    Cardiac Studies   Echo-normal EF  Patient Profile     58 y.o. male with arrhythmia, AKA, pneumonia, hemodialysis  Assessment & Plan    Arrhythmia - It is quite challenging to interpret most recent ECG with baseline artifact.  There does appear to be some P waves potentially.  Telemetry also for the  most part shows what looks like sinus rhythm with PACs and runs of atrial tachycardia. - Regardless, continue with current IV amiodarone therapy.  His blood pressure will not tolerate other medications. - Supportive care.  Norepinephrine could exacerbate arrhythmias as well.   Discussed with Dr. Patsey Berthold     For questions or updates, please contact [facility] Please consult [email or web address] for contact info under        Signed, Candee Furbish, MD  04/27/2019, 10:26 AM

## 2019-04-27 NOTE — Progress Notes (Signed)
NAME:  Fransisco Mooers, MRN:  [id number], DOB:  28-Jul-1961, LOS: 31 ADMISSION DATE:  04/19/2019, CONSULTATION DATE:  04/27/19 REFERRING MD: Dr. Horris Latino , CHIEF COMPLAINT:   Acute hypoxic respiratory failure  Brief History   58 y/o M admitted 8/7 for gangrenous, non-healing L foot ulcer, eventually underwent L AKA, developed likely aspiration PNA.  PCCM consulted for worsening respiratory status requiring Bipap, worsening sepsis 2/2 likely aspiration PNA and UTI,  intubated 8/22, CRRT started 8/23.    History of present illness   58 y.o. M with PMH of severe autism and elective mutism, anemia on iron infusions outpatient, Type 2 DM, CKD, Schizoaffective disorder, and suprapubic catheter for urinary strictures, wheelchair bound who was admitted on 04/25/2019 for non-healing L heel ulcer.  This initially began in June 2020 per notes and  was previously treated outpatient and underwent surgical debridement on 6/25.   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 Scot Dock.  He has been treated with IV abx, noted to have some fevers and chills despite surgery, and CXR showed possible R opacity. Seen by ID, concern for possible aspiration and was continued on Cefepime and Flagyl.  He has also been anemic since admission, received 2 units PRBC's on 8/7 and one unit on 8/19.    pt noted to be febrile to 101.9 and was having worsening respiratory distress and required Bipap.  He was not hypoxic by pulse ox, but  ABG concerning for PO2 of 40, so PCCM was consulted.  Past Medical History  Schizoaffective Disorder, severe autism and selective mutism, CKD, indwelling suprapubic catheter  Significant Hospital Events   05/04/2019 Admitted to hospitalists 8/18 L AKA 8/21 Respiratory distress, txfr to ICU 8/22 respiratory status and mental status cont to worsen despite diuresis, intubated.  Started on low dose neosynephrine/  8/23: started CRRT, Suprapubic catheter changed   Consults:   8/7 Vascular Surgery  8/20 Infectious Disease 8/21 PCCM  Procedures:  8/18 L AKA  Significant Diagnostic Tests:  8/20 Echocardiogram>>EF 60-65%, unable to assess RV systolic pressure, but normal RV systolic function [id number] CXR>>patchy bilateral infiltrates increased since CXR on 8/19 8/21 Renal US>> 8/23 CT chest abd pelv: Dense bilateral airspace consolidations most compatible with pneumonia. Moderate bilateral pleural effusions. Cholelithiasis. Mild perihepatic and perisplenic ascites. Aortic atherosclerosis.  Micro Data:  8/7 Sars-CoV-2>>neg 8/12 BCx2>>negative  8/18 Sars-CoV-2>>negative 8/18 BCx2>> 8/19 UC>>70,000 colonies yeast 8/19 MRSA>>neg  Antimicrobials:  Zosyn 8/7>8/13 Ceftriaxone 8/14>8/18 Vancomycin 8/7>8/11, 8/18 x1 Flagyl 8/15>8/23  Cefepime 8/18> 8/23 Diflucan 8/23> Meropenem 8/24 >>  Interim history/subjective:  Overnight still requiring high FIO2 (100%) high PEEP (14) Opens eyes to voice, but not following commands   +1L past 24, -60 past 12 hr shift  Objective   Blood pressure 98/65, pulse 90, temperature (!) 97.2 F (36.2 C), temperature source Oral, resp. rate (!) 21, height 5\' 10"  (1.778 m), weight 79.1 kg, SpO2 95 %. on Bipap 12/6    Vent Mode: PRVC FiO2 (%):  [80 %-100 %] 100 % Set Rate:  [20 bmp-22 bmp] 22 bmp Vt Set:  [440 mL-510 mL] 510 mL PEEP:  [12 cmH20-14 cmH20] 14 cmH20 Plateau Pressure:  [22 cmH20-26 cmH20] 26 cmH20   Intake/Output Summary (Last 24 hours) at 04/27/2019 1022 Last data filed at 04/27/2019 1000 Gross per 24 hour  Intake 3623.23 ml  Output 2876 ml  Net 747.23 ml   Filed Weights   04/23/19 0456 04/24/19 0433 04/27/19 0500  Weight: 85.7  kg 81.6 kg 79.1 kg    General:   chronically ill-appearing M in no acute distress, sedated, opens eyes to voice HEENT: MM pink/moist Neuro: opens eyes to command, not following commands otherwise, not blinking to threat, not really withdrawing to pain CV: s1s2, regular rate and  rhythm, no m/r/g PULM:  Decreased bs  GI: soft, bs present.  Suprapubic catheter site C/D/I Extremities: s.p L AKA warm/dry, no edema  Stump incision looks normal clean/dry/intact Skin: no rashes or lesions, warm  GU: Suprapubic catheter with yellow UOP (small)   Resolved Hospital Problem list   L heel ulcer  Assessment & Plan:    Acute hypoxic respiratory failure - aspiration PNA, dense consolidations, pulm edema, ARDS component.  Was on HD from Friday -Saturday afternoon.  Worsening hypoxia, req 100% on Bipap, and persistent WOB so was intubated.  Has been on High vent settings since.   CT chest yesterday with extensive consolidations on L >R, some GGOs, no clear mucous plug.  I don't think a bronch would necessarily be helpful based on the open subsegments on CT (he is requiring too much O2 and PEEP to tolerate bronch now anyway).   Sputum cultures sent yesterday, not much secretions being suctions.  -Pneumonia likely secondary to aspiration, ID following.  Sputum culture pending, no wbc, no orgs on gram stain.   Blood cultures remain negative. -Echo with EF of 60-65 %, worsening renal function, IVF and transfusion likely contributing.  BNP >1000 on 8/21  P:  Cont vent settings. Decreased FIO2 to 90%. Tolerating.  Low tidal volume ventilation as tolerates.  Continue meropenem (started yesterday), diflucan  per ID recs Volume removal with CRRT as tolerates.   Hypotension, fevers:  No fevers past 24 actually hypothermic after starting CRRT.  Sepsis 2/2 PNA likely.  WBC actually coming down slightly.  Possible uti with candida, given other comorbidities?   No other clear sources on CT abd (non con, though still some bowel contrast present from prior study).   Now on high pressor doses (levo, vaso).   Consider add stress dose steroids of increased pressors.   Tachycardia:  Developed irregular tachycardia, though to be afib, again on initiation of CRRT.  Remains on amio, per  cards recs.  Cardiology thinks more likely sinus tach, intermittent PACs, MAT.   Consider anticoagulation but holding off for now given risks of bleeding (recent drop in hb requiring transfusion, unk source).   Acute on chronic renal failure II and metabolic acidosis Poor UOP, started on CRRT yesterday.  P: -Appreciate nephrology recommendations hypoalb 1.3    Acute encephalopathy:  Likely metabolic, acidosis, sepsis.  Today is worse, off sedation ,opening eyes to voice, not following and fine tremor in mouth and arms.  Consult neuro for stat eeg, versed now.   Metabolic acidosis:  Likely 2/2 renal failure .  Check lactate.   Anemia:  Cont to monitor closely.  Possibly 2/2 renal failure.  HB 8.8 today.   Type 2 DM -continue Levemir and SSI  Urinary strictures , suprapubic cath -  Changed yesterday.  Appears to be draining well.  Given thick white appearance of urine on 8/22, cont to flush catheter intermittently to ensure complete drainage.   Scrotal edema and skin erosions:  Small sacral wound.   Cont to monitor. Volume removal being attempted with CRRT.  Rectal tube in place to prevent moisture and stool in area.   Schizoaffective Disorder, Autism, selective mutism -Continue  Geodon (switch to per G tube)  and prn Ativan, remeron qhS, zoloft daily   HX of seizures:  On keppra  HTN  Currently hypotensive.   Best practice:  Diet: NPO, Tube feeds  Pain/Anxiety/Delirium protocol (if indicated): Geodon prn meds for pain or agitation. Patient is very fearful of hospitals and MDs at baseline.  VAP protocol (if indicated): n/a DVT prophylaxis: heparin TID GI prophylaxis: protonix Glucose control: SSI Mobility: bed rest Code Status: fever Family Communication:  Disposition:  ICU  Labs   CBC: Recent Labs  Lab 04/23/19 0413 04/24/19 0543  04/25/19 1853 04/26/19 0400 04/26/19 1359 04/26/19 1524 04/27/19 0502  WBC 15.4* 12.7*  --  20.8* 20.9*  --   --  17.7*   NEUTROABS 11.8* 11.0*  --  20.0* 19.1*  --   --  16.4*  HGB 7.9* 7.5*   < > 7.8* 8.5* 8.5* 8.2* 8.8*  HCT 25.5* 23.9*   < > 24.1* 26.6* 25.0* 24.0* 27.3*  MCV 83.1 82.4  --  79.8* 80.9  --   --  80.3  PLT 297 320  --  330 371  --   --  270   < > = values in this interval not displayed.    Basic Metabolic Panel: Recent Labs  Lab 04/23/19 0413  04/25/19 0018  04/25/19 1853 04/26/19 0400 04/26/19 1022 04/26/19 1359 04/26/19 1524 04/26/19 1728 04/27/19 0502  NA 137   < > 142   < > 143 143  --  145 146* 143 139  K 4.0   < > 3.8   < > 3.6 4.2  --  4.1 3.9 3.9 3.9  CL 114*   < > 113*  --  113* 114*  --   --   --  113* 109  CO2 11*   < > 16*  --  20* 19*  --   --   --  17* 18*  GLUCOSE 266*   < > 186*  --  99 94  --   --   --  139* 86  BUN 34*   < > 49*  --  51* 53*  --   --   --  54* 33*  CREATININE 2.50*   < > 3.60*  --  3.95* 4.31*  --   --   --  3.71* 2.44*  CALCIUM 7.8*   < > 8.0*  --  7.6* 7.5*  --   --   --  7.3* 7.2*  MG 1.7  --  1.8  --   --   --  1.9  --   --  2.0 2.2  PHOS  --   --  3.6  --   --   --  4.7*  --   --  4.3 2.7   < > = values in this interval not displayed.   GFR: Estimated Creatinine Clearance: 34.5 mL/min (A) (by C-G formula based on SCr of 2.44 mg/dL (H)). Recent Labs  Lab 04/10/2019 2234 04/22/19 0202  04/24/19 0543 04/25/19 1853 04/26/19 0400 04/27/19 0502  WBC 12.3* 12.1*   < > 12.7* 20.8* 20.9* 17.7*  LATICACIDVEN 3.0* 0.9  --   --   --   --   --    < > = values in this interval not displayed.    Liver Function Tests: Recent Labs  Lab 04/25/19 1853 04/26/19 1728 04/27/19 0502  AST 102*  --   --   ALT 21  --   --   ALKPHOS 136*  --   --  BILITOT 0.7  --   --   PROT 5.1*  --   --   ALBUMIN 1.3* 1.2* 1.2*   No results for input(s): LIPASE, AMYLASE in the last 168 hours. No results for input(s): AMMONIA in the last 168 hours.  ABG    Component Value Date/Time   PHART 7.237 (L) 04/26/2019 1524   PCO2ART 41.1 04/26/2019 1524    PO2ART 66.0 (L) 04/26/2019 1524   HCO3 17.6 (L) 04/26/2019 1524   TCO2 19 (L) 04/26/2019 1524   ACIDBASEDEF 9.0 (H) 04/26/2019 1524   O2SAT 90.0 04/26/2019 1524     Coagulation Profile: No results for input(s): INR, PROTIME in the last 168 hours.  Cardiac Enzymes: No results for input(s): CKTOTAL, CKMB, CKMBINDEX, TROPONINI in the last 168 hours.  HbA1C: Hgb A1c MFr Bld  Date/Time Value Ref Range Status  04/11/2019 04:09 AM 6.7 (H) 4.8 - 5.6 % Final    Comment:    (NOTE) Pre diabetes:          5.7%-6.4% Diabetes:              >6.4% Glycemic control for   <7.0% adults with diabetes   10/22/2011 09:12 AM 8.0 (H) <5.7 % Final    Comment:    (NOTE)                                                                       According to the ADA Clinical Practice Recommendations for 2011, when HbA1c is used as a screening test:  >=6.5%   Diagnostic of Diabetes Mellitus           (if abnormal result is confirmed) 5.7-6.4%   Increased risk of developing Diabetes Mellitus References:Diagnosis and Classification of Diabetes Mellitus,Diabetes [id number] 1):S62-S69 and Standards of Medical Care in         Diabetes - 2011,Diabetes Care,2011,34 (Suppl 1):S11-S61.    CBG: Recent Labs  Lab 04/26/19 1632 04/26/19 1938 04/26/19 2359 04/27/19 0408 04/27/19 0736  GLUCAP 126* 89 83 91 82    Review of Systems:   Review of Systems - unable to obtain secondary to mental status   Past Medical History  He,  has a past medical history of Autism, BPH (benign prostatic hyperplasia), Depression, Diabetes mellitus, Elective mutism, Gastroparesis, Hyperglycemia, Hyperosmolar (nonketotic) coma ([place]), Hyponatremia, Mental retardation, Mental retardation, Pneumonia, Renal disorder, Urinary retention, and UTI (urinary tract infection).   Surgical History    Past Surgical History:  Procedure Laterality Date  . AMPUTATION Left 05/04/2019   Procedure: AMPUTATION ABOVE KNEE LEFT LEG;  Surgeon:  Angelia Mould, MD;  Location: [facility] OR;  Service: Vascular;  Laterality: Left;     Social History   reports that he has never smoked. He has never used smokeless tobacco. He reports that he does not drink alcohol or use drugs.   Family History   His family history is not on file.   Allergies No Known Allergies   Home Medications  Prior to Admission medications   Medication Sig Start Date End Date Taking? Authorizing Provider  acetaminophen (TYLENOL) 325 MG tablet Take 650 mg by mouth every 4 (four) hours as needed for fever (pain).    Yes [provider]  amLODipine (NORVASC) 10  MG tablet Take 10 mg by mouth daily.   Yes [provider]  ertapenem (INVANZ) 1 g injection Inject 1 g into the muscle daily.   Yes [provider]  famotidine (PEPCID) 10 MG tablet Take 20 mg by mouth daily.    Yes [provider]  hydrALAZINE (APRESOLINE) 25 MG tablet Take 25 mg by mouth 3 (three) times daily.   Yes [provider]  HYDROcodone-acetaminophen (NORCO/VICODIN) 5-325 MG tablet Take 1 tablet by mouth every 6 (six) hours as needed (pain).    Yes [provider]  insulin detemir (LEVEMIR) 100 unit/ml SOLN Inject 10 Units into the skin daily.   Yes [provider]  insulin NPH-regular Human (70-30) 100 UNIT/ML injection Inject 15 Units into the skin daily with breakfast. Hold if blood sugar < 150   Yes [provider]  insulin regular (NOVOLIN R) 100 units/mL injection Inject 10 Units into the skin See admin instructions. Give 10 units as needed for blood sugars > 400   Yes [provider]  levETIRAcetam (KEPPRA) 250 MG tablet Take 250 mg by mouth 2 (two) times daily.   Yes [provider]  mirtazapine (REMERON) 15 MG tablet Take 15 mg by mouth at bedtime.    Yes [provider]  Multiple Vitamins-Minerals (MULTIVITAMIN WITH MINERALS) tablet Take 1 tablet by mouth daily.   Yes [provider]   Nutritional Supplements (PROMOD) LIQD Take 30 mLs by mouth 2 (two) times daily.   Yes [provider]  pioglitazone (ACTOS) 45 MG tablet Take 45 mg by mouth daily.   Yes [provider]  sertraline (ZOLOFT) 100 MG tablet Take 200 mg by mouth daily.    Yes [provider]  vitamin C (ASCORBIC ACID) 500 MG tablet Take 500 mg by mouth 2 (two) times daily.   Yes [provider]  ziprasidone (GEODON) 20 MG capsule Take 20 mg by mouth daily.    Yes [provider]  ziprasidone (GEODON) 40 MG capsule Take 40 mg by mouth at bedtime.   Yes [provider]    Total critical care time 60 minutes.   [name], MD [place] [facility]

## 2019-04-27 NOTE — Progress Notes (Signed)
 Nutrition Follow-up  DOCUMENTATION CODES:   Not applicable  INTERVENTION:   Tube Feeding:  Change to Vital AF 1.2 at 50 ml/hr Pro-Stat 30 mL TID Provides 1740 kcals, 135 g of protein and 972 mL of free water  Continue Juven BID, each packet provides 80 calories, 8 grams of carbohydrate, 2.5  grams of protein (collagen), 7 grams of L-arginine and 7 grams of L-glutamine; supplement contains CaHMB, Vitamins C, E, B12 and Zinc to promote wound healing   NUTRITION DIAGNOSIS:   Increased nutrient needs related to wound healing as evidenced by estimated needs.  Being addressed via TF   GOAL:   Patient will meet greater than or equal to 90% of their needs  Progressed  MONITOR:   PO intake, Supplement acceptance, Diet advancement, Labs, Weight trends, Skin, I & O's  REASON FOR ASSESSMENT:   Consult Enteral/tube feeding initiation and management  ASSESSMENT:   Mithcell Kennedy is a 58 y.o. male with medical history significant of severe autism with elective mutism, schizoaffective disorder, diabetes mellitus type 2, chronic kidney disease, chronic suprapubic catheter for urinary strictures, who presented for CT angiogram of the left leg with Dr. Bobette Mo for nonhealing ulcer.  However, patient was found to have hemoglobin 7.8 for which the procedure was canceled.  Social worker is present at bedside and provides history as the patient is unable to provide his own.  At baseline patient is wheelchair-bound.  The wound first appeared a couple months ago.  They were not able to get into the wound care center until June 23.  He underwent surgical debridement on June 25.  During that time he was noted to have dropping hemoglobin for which he was referred to Dr. Lowanda Foster of nephrology.  Hemoglobin noted to drop to 6.8, and Dr. Lowanda Foster started him on iron infusions.  His first infusion given on July 7.  Subsequently, patient was admitted to [facility] on the 9th for a urinary  tract infection, and on July 10 was transfused 2 units of blood as his hemoglobin was down to 6.3.  Stool guaiacs were reported to be negative at that time.  Patient received the second iron fusion on the 30th, but had not been formally seen by Dr. Lowanda Foster.   8/18 L. AKA 8/22 Intubated  8/23 CRRT initiated, TF started at 20 ml/hr  Patient is currently intubated on ventilator support, requiring pressors, remains on vasopressin, levophed trending down, off phenylephrine MV: 12.5 L/min Temp (24hrs), Avg:96.5 F (35.8 C), Min:94.2 F (34.6 C), Max:98 F (36.7 C)  Vital High Protein at 20 ml/hr, Pro-Stat 30 mL TID, Juven BID via OG tube  Admit wt 71.4 kg; current wt 79.1 kg. Net +9 L since admission.  Labs: CBGs 82-96, corrected calcium 9.4, albumin 1.2 Meds: ss novolog, novolog with meals, levemir, mag sulfate, remeron, MVI liquid    Diet Order:   Diet Order            Diet NPO time specified  Diet effective now              EDUCATION NEEDS:   No education needs have been identified at this time  Skin:  Skin Assessment: Skin Integrity Issues: Skin Integrity Issues:: Stage II, Diabetic Ulcer Stage II: sacrum, scrotum Diabetic Ulcer: lt heel  Last BM:  8/24 rectal tube  Height:   Ht Readings from Last 1 Encounters:  04/18/2019 5\' 10"  (1.778 m)    Weight:   Wt Readings from Last 1 Encounters:  04/27/19 79.1 kg    Ideal Body Weight:  75.5 kg  BMI:  Body mass index is 25.02 kg/m.  Estimated Nutritional Needs:   Kcal:  1792 kcals  Protein:  130-155 g  Fluid:  >/= 1.7 L    Trip Cavanagh MS, RDN, LDN, CNSC [phone] Pager  [phone] Weekend/On-Call Pager

## 2019-04-27 NOTE — Consult Note (Addendum)
Neurology Consultation  Reason for Consult: Possible seizure activity   Referring Physician: Dr. Patsey Berthold  History is obtained from: [facility] chart as patient cannot give history  HPI: [name] is a 58 y.o. male with past medical history of autism, BPH, diabetes, hyperglycemia, hyperosmolar nonketotic coma, mental retardation, hyponatremia, pneumonia, UTI.  Patient initially was brought to the hospital for a above-knee amputation.  Initially he was septic and the surgery had to be postponed.  Eventually surgery was done and then patient became septic post surgery and was shown to have a right lower lobe infiltration from aspiration pneumonia.  On 8/19 he developed respiratory distress and hypoxia requiring BiPAP.  Patient's creatinine increased from baseline of 2.0-2.9.  Second chest ray showed patchy bilateral airspace disease, right greater than left compatible with pneumonia.  Patient was placed on multiple antibiotics by ID.  At this point he has received 5 days of ceftriaxone, 6 days of Zosyn, 5 days of myosin, 7 days of Flagyl, 6 days of cefepime, currently on Diflucan and meropenem.  Thus far patient is negative for COVID, and cultures x2 have been negative, urine culture did show yeast.  Today patient was less alert than usual, and was showing what possibly could be either seizure and or tremoring of mouth and arms.  For that reason neurology was consulted to evaluate.  Per ID antibiotics that he has been on include: Zosyn 8/7>8/13 Ceftriaxone 8/14>8/18 Vancomycin 8/7>8/11, 8/18 x1 Flagyl 8/15>8/23  Cefepime 8/18> 8/23 Diflucan 8/23> Meropenem 8/24 >>  Do not see a history of seizures however he is on Keppra 250 mg twice daily at home.  Current labs include a white blood cell count of 20.9, elevated creatinine of 4.31 currently on CRRT.   ROS: Unable to obtain due to altered mental status.   Past Medical History:  Diagnosis Date  . Autism   . BPH (benign prostatic hyperplasia)    . Depression   . Diabetes mellitus   . Elective mutism   . Gastroparesis   . Hyperglycemia   . Hyperosmolar (nonketotic) coma ([place])   . Hyponatremia   . Mental retardation   . Mental retardation   . Pneumonia   . Renal disorder   . Urinary retention   . UTI (urinary tract infection)     Family History  Problem Relation Age of Onset  . Hypertension Mother   . Hypertension Father     Social History:   reports that he has never smoked. He has never used smokeless tobacco. He reports that he does not drink alcohol or use drugs.  Medications  Current Facility-Administered Medications:  .   prismasol BGK 4/2.5 infusion, , CRRT, Continuous, [name], MD, Last Rate: 400 mL/hr at 04/27/19 0301 .   prismasol BGK 4/2.5 infusion, , CRRT, Continuous, [name], MD, Last Rate: 300 mL/hr at 04/27/19 0750 .  0.9 %  sodium chloride infusion (Manually program via Guardrails IV Fluids), , Intravenous, Once, Kirby-Graham, Karsten Fells, NP .  0.9 %  sodium chloride infusion, , Intravenous, PRN, Alma Friendly, MD, Last Rate: 10 mL/hr at 04/27/19 1300 .  oxyCODONE (ROXICODONE) 5 MG/5ML solution 5-10 mg, 5-10 mg, Per Tube, Q4H PRN **AND** acetaminophen (TYLENOL) solution 325-650 mg, 325-650 mg, Per Tube, Q4H PRN, [name], MD .  acetaminophen (TYLENOL) tablet 650 mg, 650 mg, Oral, Q6H PRN, 650 mg at 04/26/19 0409 **OR** acetaminophen (TYLENOL) suppository 650 mg, 650 mg, Rectal, Q6H PRN, Dagoberto Ligas, PA-C, 650 mg at 04/24/19 2231 .  amiodarone (NEXTERONE PREMIX) 360-4.14 MG/200ML-% (1.8 mg/mL) IV infusion, 30 mg/hr, Intravenous, Continuous, [name], MD, Last Rate: 16.67 mL/hr at 04/27/19 1300, 30 mg/hr at 04/27/19 1300 .  bisacodyl (DULCOLAX) suppository 10 mg, 10 mg, Rectal, Daily PRN, [name], MD .  chlorhexidine gluconate (MEDLINE KIT) (PERIDEX) 0.12 % solution 15 mL, 15 mL, Mouth Rinse, BID, Alma Friendly, MD, 15 mL at 04/27/19 0746 .   Chlorhexidine Gluconate Cloth 2 % PADS 6 each, 6 each, Topical, Q0600, Dagoberto Ligas, PA-C, 6 each at 04/26/19 0957 .  dexmedetomidine (PRECEDEX) 400 MCG/100ML (4 mcg/mL) infusion, 0.4-1.2 mcg/kg/hr, Intravenous, Titrated, [name], MD, Stopped at 04/26/19 [phone]  docusate (COLACE) 50 MG/5ML liquid 50 mg, 50 mg, Per Tube, Daily, [name], MD .  feeding supplement (PRO-STAT SUGAR FREE 64) liquid 30 mL, 30 mL, Per Tube, TID WC, [name], MD, 30 mL at 04/27/19 1115 .  feeding supplement (VITAL HIGH PROTEIN) liquid 1,000 mL, 1,000 mL, Per Tube, Continuous, [name], MD, Last Rate: 20 mL/hr at 04/26/19 1517, 1,000 mL at 04/26/19 1517 .  fluconazole (DIFLUCAN) IVPB 400 mg, 400 mg, Intravenous, Q24H, [name], MD, Stopped at 04/27/19 1245 .  folic acid (FOLVITE) tablet 1 mg, 1 mg, Per Tube, Daily, [name], MD, 1 mg at 04/27/19 0934 .  heparin injection 1,000-6,000 Units, 1,000-6,000 Units, CRRT, PRN, [name], MD, 2,800 Units at 04/26/19 1148 .  heparin injection 5,000 Units, 5,000 Units, Subcutaneous, Q8H, Dagoberto Ligas, PA-C, 5,000 Units at 04/27/19 0932 .  insulin aspart (novoLOG) injection 0-9 Units, 0-9 Units, Subcutaneous, QID, [name], MD, 1 Units at 04/26/19 1715 .  insulin aspart (novoLOG) injection 2 Units, 2 Units, Subcutaneous, TID WC, Starla Link, Kshitiz, MD, 2 Units at 04/26/19 1715 .  insulin detemir (LEVEMIR) injection 7 Units, 7 Units, Subcutaneous, Daily, Alma Friendly, MD, 7 Units at 04/27/19 1004 .  ipratropium-albuterol (DUONEB) 0.5-2.5 (3) MG/3ML nebulizer solution 3 mL, 3 mL, Nebulization, Q4H PRN, Bodenheimer, Charles A, NP, 3 mL at 04/24/19 0457 .  levETIRAcetam (KEPPRA) 250 mg in sodium chloride 0.9 % 100 mL IVPB, 250 mg, Intravenous, Q12H, Bodenheimer, Charles A, NP, Stopped at 04/27/19 0911 .  LORazepam (ATIVAN) injection 1 mg, 1 mg, Intravenous, Q4H PRN, Gleason, Otilio Carpen, PA-C, 1 mg at 04/26/19 1002 .   magnesium sulfate IVPB 2 g 50 mL, 2 g, Intravenous, Daily PRN, Dagoberto Ligas, PA-C .  MEDLINE mouth rinse, 15 mL, Mouth Rinse, 10 times per day, Alma Friendly, MD, 15 mL at 04/27/19 1116 .  meropenem (MERREM) 1 g in sodium chloride 0.9 % 100 mL IVPB, 1 g, Intravenous, Q12H, [name], MD, Stopped at 04/27/19 1006 .  metoprolol tartrate (LOPRESSOR) injection 2-5 mg, 2-5 mg, Intravenous, Q2H PRN, Eveland, Matthew, PA-C .  mirtazapine (REMERON) tablet 15 mg, 15 mg, Per Tube, Alden Hipp, MD, 15 mg at 04/26/19 2144 .  morphine 2 MG/ML injection 2-4 mg, 2-4 mg, Intravenous, Q2H PRN, Dagoberto Ligas, PA-C, 2 mg at 04/24/19 0123 .  multivitamin liquid 15 mL, 15 mL, Oral, Daily, [name], MD, 15 mL at 04/27/19 0935 .  norepinephrine (LEVOPHED) 16 mg in 234m premix infusion, 0-40 mcg/min, Intravenous, Titrated, GCollier Bullock MD, Last Rate: 14.06 mL/hr at 04/27/19 1300, 15 mcg/min at 04/27/19 1300 .  nutrition supplement (JUVEN) (JUVEN) powder packet 1 packet, 1 packet, Per Tube, BID BM, GCollier Bullock MD, 1 packet at 04/27/19 0935 .  ondansetron (ZOFRAN) tablet 4 mg, 4 mg, Oral, Q6H PRN, 4  mg at 04/11/2019 2142 **OR** ondansetron (ZOFRAN) injection 4 mg, 4 mg, Intravenous, Q6H PRN, Dagoberto Ligas, PA-C, 4 mg at 04/22/19 1606 .  oxyCODONE-acetaminophen (PERCOCET/ROXICET) 5-325 MG per tablet 1-2 tablet, 1-2 tablet, Oral, Q4H PRN, Eveland, Matthew, PA-C .  pantoprazole (PROTONIX) injection 40 mg, 40 mg, Intravenous, Q24H, Bodenheimer, Charles A, NP, 40 mg at 04/26/19 2049 .  phenylephrine (NEOSYNEPHRINE) 40-0.9 MG/250ML-% infusion, 0-200 mcg/min, Intravenous, Titrated, [name], MD, Stopped at 04/27/19 0305 .  prismasol BGK 4/2.5 infusion, , CRRT, Continuous, [name], MD, Last Rate: 1,800 mL/hr at 04/27/19 1034 .  sennosides (SENOKOT) 8.8 MG/5ML syrup 10 mL, 10 mL, Per Tube, Daily PRN, [name], MD, 10 mL at 04/26/19 1114 .  sertraline (ZOLOFT)  tablet 200 mg, 200 mg, Per Tube, Daily, [name], MD, 200 mg at 04/27/19 0935 .  sodium chloride flush (NS) 0.9 % injection 3 mL, 3 mL, Intravenous, Q12H, Eveland, Matthew, PA-C, 3 mL at 04/27/19 0857 .  vasopressin (PITRESSIN) 40 Units in sodium chloride 0.9 % 250 mL (0.16 Units/mL) infusion, 0.04 Units/min, Intravenous, Continuous, [name], MD, Last Rate: 15 mL/hr at 04/27/19 1300, 0.04 Units/min at 04/27/19 1300 .  ziprasidone (GEODON) capsule 40 mg, 40 mg, Oral, QHS, [name], MD   Exam: Current vital signs: BP 104/86   Pulse 87   Temp (!) 97.3 F (36.3 C) (Oral)   Resp (!) 21   Ht 5' 10" (1.778 m)   Wt 79.1 kg   SpO2 93%   BMI 25.02 kg/m  Vital signs in last 24 hours: Temp:  [94.2 F (34.6 C)-98 F (36.7 C)] 97.3 F (36.3 C) (08/24 1118) Pulse Rate:  [75-143] 87 (08/24 1300) Resp:  [15-26] 21 (08/24 1300) BP: (67-136)/(33-89) 104/86 (08/24 1300) SpO2:  [85 %-100 %] 93 % (08/24 1300) Arterial Line BP: (79-153)/(33-56) 153/48 (08/24 1300) FiO2 (%):  [80 %-100 %] 90 % (08/24 1103) Weight:  [79.1 kg] 79.1 kg (08/24 0500)  Physical Exam  Constitutional: Appears well-developed and well-nourished.  Psych: Affect appropriate to situation Eyes: No scleral injection HENT: No OP obstrucion Head: Normocephalic.  Cardiovascular: Normal rate and regular rhythm.  Respiratory: Effort normal, non-labored breathing GI: Soft.  No distension. There is no tenderness.  Skin: WDI  Neuro: Mental Status: Patient is intubated and sedated.  Does not follow verbal commands.  Intermittently will move right leg but does not move left stump Cranial Nerves: II: No blink to threat III,IV, VI: He was equal round and reactive.  Initially they were forced gaze to the right and then slowly came back to midline however never crossed midline to doll's maneuver VII: Facial movement is symmetric.   Motor: Spontaneously moving right leg and to noxious stimuli.  Does not react to  stimuli bilateral upper extremities.  Does lift stump to noxious stimuli on the left Sensory: As above Deep Tendon Reflexes: None noted throughout Plantars: Mute on the right Cerebellar: Unable to assess  Labs I have reviewed labs in epic and the results pertinent to this consultation are:   CBC    Component Value Date/Time   WBC 17.7 (H) 04/27/2019 0502   RBC 3.40 (L) 04/27/2019 0502   HGB 8.8 (L) 04/27/2019 0502   HCT 27.3 (L) 04/27/2019 0502   PLT 270 04/27/2019 0502   MCV 80.3 04/27/2019 0502   MCH 25.9 (L) 04/27/2019 0502   MCHC 32.2 04/27/2019 0502   RDW 18.7 (H) 04/27/2019 0502   LYMPHSABS 0.7 04/27/2019 0502   MONOABS 0.1  04/27/2019 0502   EOSABS 0.3 04/27/2019 0502   BASOSABS 0.1 04/27/2019 0502    CMP     Component Value Date/Time   NA 139 04/27/2019 0502   K 3.9 04/27/2019 0502   CL 109 04/27/2019 0502   CO2 18 (L) 04/27/2019 0502   GLUCOSE 86 04/27/2019 0502   BUN 33 (H) 04/27/2019 0502   CREATININE 2.44 (H) 04/27/2019 0502   CALCIUM 7.2 (L) 04/27/2019 0502   PROT 5.1 (L) 04/25/2019 1853   ALBUMIN 1.2 (L) 04/27/2019 0502   AST 102 (H) 04/25/2019 1853   ALT 21 04/25/2019 1853   ALKPHOS 136 (H) 04/25/2019 1853   BILITOT 0.7 04/25/2019 1853   GFRNONAA 28 (L) 04/27/2019 0502   GFRAA 33 (L) 04/27/2019 0502    Lipid Panel     Component Value Date/Time   CHOL (H) 06/20/2010 0513    202        ATP III CLASSIFICATION:  <200     mg/dL   Desirable  200-239  mg/dL   Borderline High  >=240    mg/dL   High          TRIG 150 (H) 06/20/2010 0513   HDL 53 06/20/2010 0513   CHOLHDL 3.8 06/20/2010 0513   VLDL 30 06/20/2010 0513   LDLCALC (H) 06/20/2010 0513    119        Total Cholesterol/HDL:CHD Risk Coronary Heart Disease Risk Table                     Men   Women  1/2 Average Risk   3.4   3.3  Average Risk       5.0   4.4  2 X Average Risk   9.6   7.1  3 X Average Risk  23.4   11.0        Use the calculated Patient Ratio above and the CHD Risk  Table to determine the patient's CHD Risk.        ATP III CLASSIFICATION (LDL):  <100     mg/dL   Optimal  100-129  mg/dL   Near or Above                    Optimal  130-159  mg/dL   Borderline  160-189  mg/dL   High  >190     mg/dL   Very High     Imaging I have reviewed the images obtained:   Etta Quill PA-C [facility] [phone] 04/27/2019, 1:49 PM     Assessment: 58 year old male who initially came to the hospital for a left above-knee amputation.  Prior to amputation patient did become septic.  That resolved.  Had surgery and then became septic for second time.  Also had yeast growing in his urine.  Currently being treated with ID recommendations.  Today was seen to have what appeared to be shivering per nurse and was given Versed prior to consultation and now not having the shivering versus shaking.  It is noted that his eyes are deviated to the right at times and do not cross midline to the left.   -- Differential includes possible intermittent ongoing subclinical seizures. However, EEG findings are most consistent with generalized non-specific cerebral dysfunction (encephalopathy). Please note that lack of epileptiform activity on EEG does not preclude the possibility of epilepsy.   -- Also on the DDx is multifocal strokes secondary to possible septic emboli.  -- A component of his  AMS most likely is secondary to septic encephalopathy. Toxic/metabolic encephalopathy also likely playing a role -- Technically limited TTE shows ejection fraction of 60-65%. The cavity size was normal.Technically difficult study due to limited windows. Cannot assess for focal wall motion abnormalities. The right ventricle has normal systolc function. The cavity was normal. There is no increase in right ventricular wall thickness. Right ventricular systolic pressure could not be assessed. The aortic valve was not well visualized. The mitral valve is grossly normal. The Pulmonary valve was  not visualized.  Recommendations: - He is on 250 mg of Keppra BID and has room to move up to 500 BID but would avoid higher doses as he does have renal dysfunction. - MRI brain when able. -- May need TEE if MRI shows findings consistent with cardioembolic strokes  I have seen and examined the patient. I have discussed the assessment and recommendations with Etta Quill, PA and made amendations as appropriate. 58 year old male with multiple medical comorbidities who manifested shivering-like movements that were concerning for possible seizure.  Electronically signed: Dr. Kerney Elbe

## 2019-04-27 NOTE — Progress Notes (Signed)
EEG complete - results pending 

## 2019-04-27 NOTE — Progress Notes (Signed)
[facility]  Date of Admission:  04/12/2019     Total days of antibiotics 18 Day 8 of metronidazole Day 6 of Cefepime Day 2 of Meropenem Day 2 of fluconazole         ASSESSMENT/PLAN  [name] continues to have tenuous respiratory function requiring increasing levels of mechanical ventilatory support and started on CRRT for worsening renal function and requiring vasopressor support to maintain blood pressure. This appears to be a combination of aspiration pneumonia and pulmonary edema. Cefepime/metronidazole changed yesterday to meropenem which will add coverage for ESBL if present. Fluconazole added to cover for candida found in urine and suprapubic cathter changed yesterday. Blood cultures and respiratory cultures have been without growth to date.  Surgical site remains without evidence of infection. No other clear source of infection is identified.   1. Continue meropenem and fluconzole. 2. Mechanical ventilation per CCM. 3. CRRT per nephrology. 4. Monitor culture results.   Principal Problem:   Aspiration pneumonia ([name]) Active Problems:   Postoperative fever   Intellectual disability   Acute kidney injury superimposed on CKD ([name])   Chronic indwelling Foley catheter   Diabetes mellitus out of control ([name])   Anemia, blood loss   Leukocytosis   History of seizure   Gangrene ([name])   Pressure injury of skin   Folate deficiency   Gas gangrene of foot ([name])   AKI (acute kidney injury) ([name])   . sodium chloride   Intravenous Once  . chlorhexidine gluconate (MEDLINE KIT)  15 mL Mouth Rinse BID  . Chlorhexidine Gluconate Cloth  6 each Topical Q0600  . docusate  50 mg Per Tube Daily  . feeding supplement (PRO-STAT SUGAR FREE 64)  30 mL Per Tube TID WC  . folic acid  1 mg Per Tube Daily  . heparin  5,000 Units Subcutaneous Q8H  . insulin aspart  0-9 Units Subcutaneous QID  . insulin aspart  2 Units Subcutaneous TID WC  . insulin detemir  7  Units Subcutaneous Daily  . mouth rinse  15 mL Mouth Rinse 10 times per day  . mirtazapine  15 mg Per Tube QHS  . multivitamin  15 mL Oral Daily  . nutrition supplement (JUVEN)  1 packet Per Tube BID BM  . pantoprazole (PROTONIX) IV  40 mg Intravenous Q24H  . sertraline  200 mg Per Tube Daily  . sodium chloride flush  3 mL Intravenous Q12H  . ziprasidone  40 mg Oral QHS    SUBJECTIVE:  Hypothermic overnight with stable WBC count of 17.7. Remains on ventilator with CRRT and vasopressor support. Not currently sedated with no significant response.   No Known Allergies   Review of Systems: Review of Systems  Unable to perform ROS: Critical illness    OBJECTIVE: Vitals:   04/27/19 0800 04/27/19 0900 04/27/19 1000 04/27/19 1103  BP: 1_0 104/64  Pulse: (!) 120 90 88   Resp: (!) 21 (!) 21 19   Temp:      TempSrc:      SpO2: 97% 95% 96% 94%  Weight:      Height:       Body mass index is 25.02 kg/m.  Physical Exam Constitutional:      General: He is not in acute distress.    Appearance: He is well-developed. He is ill-appearing.     Comments: Lying in bed with head of bed elevated  Cardiovascular:     Rate and Rhythm: Normal  rate and regular rhythm.     Heart sounds: Normal heart sounds.     Comments: Dialysis catheter in left side of neck with dressing that is clean and dry and site appears free from infection; generally edematous Pulmonary:     Effort: Pulmonary effort is normal.     Breath sounds: Wheezing and rhonchi present.     Comments: Ventilator on full ventilatory support with oxygen at 90% and PEEP of 14 Abdominal:     General: Bowel sounds are normal.  Genitourinary:    Comments: CRRT at bedside and functional Skin:    General: Skin is warm and dry.  Neurological:     Mental Status: He is alert and oriented to person, place, and time.  Psychiatric:        Behavior: Behavior normal.        Thought Content: Thought content normal.         Judgment: Judgment normal.     Lab Results Lab Results  Component Value Date   WBC 17.7 (H) 04/27/2019   HGB 8.8 (L) 04/27/2019   HCT 27.3 (L) 04/27/2019   MCV 80.3 04/27/2019   PLT 270 04/27/2019    Lab Results  Component Value Date   CREATININE 2.44 (H) 04/27/2019   BUN 33 (H) 04/27/2019   NA 139 04/27/2019   K 3.9 04/27/2019   CL 109 04/27/2019   CO2 18 (L) 04/27/2019    Lab Results  Component Value Date   ALT 21 04/25/2019   AST 102 (H) 04/25/2019   ALKPHOS 136 (H) 04/25/2019   BILITOT 0.7 04/25/2019     Microbiology: Recent Results (from the past 240 hour(s))  Culture, blood (routine x 2)     Status: None   Collection Time: 04/29/2019 10:18 PM   Specimen: BLOOD LEFT HAND  Result Value Ref Range Status   Specimen Description BLOOD LEFT HAND  Final   Special Requests   Final    BOTTLES DRAWN AEROBIC AND ANAEROBIC Blood Culture adequate volume   Culture   Final    NO GROWTH 5 DAYS Performed at [facility], [address] [name], [name] [address]    Report Status 04/26/2019 FINAL  Final  Culture, blood (routine x 2)     Status: None   Collection Time: 05/03/2019 10:25 PM   Specimen: BLOOD RIGHT HAND  Result Value Ref Range Status   Specimen Description BLOOD RIGHT HAND  Final   Special Requests   Final    BOTTLES DRAWN AEROBIC AND ANAEROBIC Blood Culture adequate volume   Culture   Final    NO GROWTH 5 DAYS Performed at [name], [place] [address] [place], [name] [address]    Report Status 04/26/2019 FINAL  Final  Culture, Urine     Status: Abnormal   Collection Time: 04/22/19  8:46 AM   Specimen: Urine, Suprapubic  Result Value Ref Range Status   Specimen Description URINE, SUPRAPUBIC  Final   Special Requests   Final    NONE Performed at [facility], [place] [address], [place], [place] [address]    Culture 70,000 COLONIES/mL YEAST (A)  Final   Report Status 04/23/2019 FINAL  Final  MRSA PCR Screening     Status: None    Collection Time: 04/22/19  3:56 PM   Specimen: Nasal Mucosa; Nasopharyngeal  Result Value Ref Range Status   MRSA by PCR NEGATIVE NEGATIVE Final    Comment:  The GeneXpert MRSA Assay (FDA approved for NASAL specimens only), is one component of a comprehensive MRSA colonization surveillance program. It is not intended to diagnose MRSA infection nor to guide or monitor treatment for MRSA infections. Performed at [facility], [place] [address], [place], [place] [address]   Culture, blood (routine x 2)     Status: None (Preliminary result)   Collection Time: 04/24/19  1:35 PM   Specimen: BLOOD  Result Value Ref Range Status   Specimen Description BLOOD RIGHT ANTECUBITAL  Final   Special Requests   Final    BOTTLES DRAWN AEROBIC ONLY Blood Culture adequate volume   Culture   Final    NO GROWTH 3 DAYS Performed at [facility], [place] [address] [place], [place] [address]    Report Status PENDING  Incomplete  Culture, blood (routine x 2)     Status: None (Preliminary result)   Collection Time: 04/24/19  1:35 PM   Specimen: BLOOD RIGHT HAND  Result Value Ref Range Status   Specimen Description BLOOD RIGHT HAND  Final   Special Requests   Final    BOTTLES DRAWN AEROBIC ONLY Blood Culture results may not be optimal due to an inadequate volume of blood received in culture bottles   Culture   Final    NO GROWTH 3 DAYS Performed at [facility], [place] [address] [place], [place] [address]    Report Status PENDING  Incomplete  Culture, respiratory     Status: None (Preliminary result)   Collection Time: 04/26/19 11:59 AM   Specimen: Endotracheal  Result Value Ref Range Status   Specimen Description ENDOTRACHEAL  Final   Special Requests NONE  Final   Gram Stain NO WBC SEEN NO ORGANISMS SEEN   Final   Culture   Final    CULTURE REINCUBATED FOR BETTER GROWTH Performed at [facility], [address], [place], [place] [address]    Report Status PENDING   Incomplete     Terri Piedra, [facility] [phone] Pager  04/27/2019  11:17 AM

## 2019-04-28 ENCOUNTER — Inpatient Hospital Stay (HOSPITAL_COMMUNITY): Payer: Medicaid Other

## 2019-04-28 DIAGNOSIS — J80 Acute respiratory distress syndrome: Secondary | ICD-10-CM

## 2019-04-28 DIAGNOSIS — Z7189 Other specified counseling: Secondary | ICD-10-CM

## 2019-04-28 DIAGNOSIS — U071 COVID-19: Secondary | ICD-10-CM

## 2019-04-28 DIAGNOSIS — Z515 Encounter for palliative care: Secondary | ICD-10-CM

## 2019-04-28 LAB — POCT I-STAT 7, (LYTES, BLD GAS, ICA,H+H)
Acid-base deficit: 18 mmol/L — ABNORMAL HIGH (ref 0.0–2.0)
Acid-base deficit: 16 mmol/L — ABNORMAL HIGH (ref 0.0–2.0)
Acid-base deficit: 19 mmol/L — ABNORMAL HIGH (ref 0.0–2.0)
Bicarbonate: 12.3 mmol/L — ABNORMAL LOW (ref 20.0–28.0)
Bicarbonate: 12.9 mmol/L — ABNORMAL LOW (ref 20.0–28.0)
Bicarbonate: 12.3 mmol/L — ABNORMAL LOW (ref 20.0–28.0)
Calcium, Ion: 1.04 mmol/L — ABNORMAL LOW (ref 1.15–1.40)
Calcium, Ion: 1.01 mmol/L — ABNORMAL LOW (ref 1.15–1.40)
Calcium, Ion: 0.98 mmol/L — ABNORMAL LOW (ref 1.15–1.40)
HCT: 30 % — ABNORMAL LOW (ref 39.0–52.0)
HCT: 30 % — ABNORMAL LOW (ref 39.0–52.0)
HCT: 28 % — ABNORMAL LOW (ref 39.0–52.0)
Hemoglobin: 10.2 g/dL — ABNORMAL LOW (ref 13.0–17.0)
Hemoglobin: 10.2 g/dL — ABNORMAL LOW (ref 13.0–17.0)
Hemoglobin: 9.5 g/dL — ABNORMAL LOW (ref 13.0–17.0)
O2 Saturation: 79 %
O2 Saturation: 80 %
O2 Saturation: 76 %
Patient temperature: 94
Potassium: 4 mmol/L (ref 3.5–5.1)
Potassium: 4 mmol/L (ref 3.5–5.1)
Potassium: 4.1 mmol/L (ref 3.5–5.1)
Sodium: 140 mmol/L (ref 135–145)
Sodium: 140 mmol/L (ref 135–145)
Sodium: 143 mmol/L (ref 135–145)
TCO2: 14 mmol/L — ABNORMAL LOW (ref 22–32)
TCO2: 14 mmol/L — ABNORMAL LOW (ref 22–32)
TCO2: 14 mmol/L — ABNORMAL LOW (ref 22–32)
pCO2 arterial: 50.4 mmHg — ABNORMAL HIGH (ref 32.0–48.0)
pCO2 arterial: 44.5 mmHg (ref 32.0–48.0)
pCO2 arterial: 49.6 mmHg — ABNORMAL HIGH (ref 32.0–48.0)
pH, Arterial: 6.995 — CL (ref 7.350–7.450)
pH, Arterial: 7.071 — CL (ref 7.350–7.450)
pH, Arterial: 6.985 — CL (ref 7.350–7.450)
pO2, Arterial: 66 mmHg — ABNORMAL LOW (ref 83.0–108.0)
pO2, Arterial: 62 mmHg — ABNORMAL LOW (ref 83.0–108.0)
pO2, Arterial: 53 mmHg — ABNORMAL LOW (ref 83.0–108.0)

## 2019-04-28 LAB — HEPATIC FUNCTION PANEL
ALT: 32 U/L (ref 0–44)
AST: <5 U/L — ABNORMAL LOW (ref 15–41)
Albumin: <1 g/dL — ABNORMAL LOW (ref 3.5–5.0)
Alkaline Phosphatase: 304 U/L — ABNORMAL HIGH (ref 38–126)
Bilirubin, Direct: 0.5 mg/dL — ABNORMAL HIGH (ref 0.0–0.2)
Indirect Bilirubin: 0.5 mg/dL (ref 0.3–0.9)
Total Bilirubin: 1 mg/dL (ref 0.3–1.2)
Total Protein: 3.6 g/dL — ABNORMAL LOW (ref 6.5–8.1)

## 2019-04-28 LAB — CBC WITH DIFFERENTIAL/PLATELET
Abs Immature Granulocytes: 0 10*3/uL (ref 0.00–0.07)
Basophils Absolute: 0 10*3/uL (ref 0.0–0.1)
Basophils Relative: 0 %
Eosinophils Absolute: 0.4 10*3/uL (ref 0.0–0.5)
Eosinophils Relative: 2 %
HCT: 31 % — ABNORMAL LOW (ref 39.0–52.0)
Hemoglobin: 9.5 g/dL — ABNORMAL LOW (ref 13.0–17.0)
Lymphocytes Relative: 2 %
Lymphs Abs: 0.4 10*3/uL — ABNORMAL LOW (ref 0.7–4.0)
MCH: 25.9 pg — ABNORMAL LOW (ref 26.0–34.0)
MCHC: 30.6 g/dL (ref 30.0–36.0)
MCV: 84.5 fL (ref 80.0–100.0)
Monocytes Absolute: 0.6 10*3/uL (ref 0.1–1.0)
Monocytes Relative: 3 %
Neutro Abs: 18.8 10*3/uL — ABNORMAL HIGH (ref 1.7–7.7)
Neutrophils Relative %: 93 %
Platelets: 270 10*3/uL (ref 150–400)
RBC: 3.67 MIL/uL — ABNORMAL LOW (ref 4.22–5.81)
RDW: 19.7 % — ABNORMAL HIGH (ref 11.5–15.5)
WBC: 20.2 10*3/uL — ABNORMAL HIGH (ref 4.0–10.5)
nRBC: 0 /100 WBC
nRBC: 1.4 % — ABNORMAL HIGH (ref 0.0–0.2)

## 2019-04-28 LAB — RENAL FUNCTION PANEL
Albumin: 1 g/dL — ABNORMAL LOW (ref 3.5–5.0)
Albumin: <1 g/dL — ABNORMAL LOW (ref 3.5–5.0)
Anion gap: 15 (ref 5–15)
Anion gap: 24 — ABNORMAL HIGH (ref 5–15)
BUN: 19 mg/dL (ref 6–20)
BUN: 12 mg/dL (ref 6–20)
CO2: 14 mmol/L — ABNORMAL LOW (ref 22–32)
CO2: 13 mmol/L — ABNORMAL LOW (ref 22–32)
Calcium: 7.2 mg/dL — ABNORMAL LOW (ref 8.9–10.3)
Calcium: 7 mg/dL — ABNORMAL LOW (ref 8.9–10.3)
Chloride: 106 mmol/L (ref 98–111)
Chloride: 104 mmol/L (ref 98–111)
Creatinine, Ser: 1.29 mg/dL — ABNORMAL HIGH (ref 0.61–1.24)
Creatinine, Ser: 1.06 mg/dL (ref 0.61–1.24)
GFR calc Af Amer: >60 mL/min (ref >60)
GFR calc Af Amer: >60 mL/min (ref >60)
GFR calc non Af Amer: >60 mL/min (ref >60)
GFR calc non Af Amer: >60 mL/min (ref >60)
Glucose, Bld: 92 mg/dL (ref 70–99)
Glucose, Bld: 62 mg/dL — ABNORMAL LOW (ref 70–99)
Phosphorus: 2.5 mg/dL (ref 2.5–4.6)
Phosphorus: 3.7 mg/dL (ref 2.5–4.6)
Potassium: 4.2 mmol/L (ref 3.5–5.1)
Potassium: 4.1 mmol/L (ref 3.5–5.1)
Sodium: 135 mmol/L (ref 135–145)
Sodium: 141 mmol/L (ref 135–145)

## 2019-04-28 LAB — SARS CORONAVIRUS 2 (TAT 6-24 HRS): SARS Coronavirus 2: POSITIVE — AB

## 2019-04-28 LAB — C-REACTIVE PROTEIN: CRP: 22.4 mg/dL — ABNORMAL HIGH (ref <1.0)

## 2019-04-28 LAB — GLUCOSE, CAPILLARY
Glucose-Capillary: 87 mg/dL (ref 70–99)
Glucose-Capillary: 121 mg/dL — ABNORMAL HIGH (ref 70–99)

## 2019-04-28 LAB — FERRITIN: Ferritin: >7500 ng/mL — ABNORMAL HIGH (ref 24–336)

## 2019-04-28 LAB — D-DIMER, QUANTITATIVE: D-Dimer, Quant: 2.51 ug/mL-FEU — ABNORMAL HIGH (ref 0.00–0.50)

## 2019-04-28 MED ORDER — INSULIN ASPART 100 UNIT/ML ~~LOC~~ SOLN: 0.0000 [IU] | SUBCUTANEOUS | Status: DC

## 2019-04-28 MED ORDER — HEPARIN SODIUM (PORCINE) 5000 UNIT/ML IJ SOLN
7500.0000 [IU] | Freq: Three times a day (TID) | INTRAMUSCULAR | Status: DC
  Administered 2019-04-28: 7500 [IU] via SUBCUTANEOUS
  Filled 2019-04-28: qty 2

## 2019-04-28 MED ORDER — SODIUM BICARBONATE 8.4 % IV SOLN
INTRAVENOUS | Status: AC
  Administered 2019-04-28: 18:00:00
  Filled 2019-04-28: qty 100

## 2019-04-28 MED ORDER — SODIUM CHLORIDE 0.9 % IV SOLN
200.0000 mg | Freq: Once | INTRAVENOUS | Status: AC
  Administered 2019-04-28: 11:00:00 200 mg via INTRAVENOUS
  Filled 2019-04-28: qty 40

## 2019-04-28 MED ORDER — STERILE WATER FOR INJECTION IV SOLN
INTRAVENOUS | Status: DC
  Administered 2019-04-28 (×2): via INTRAVENOUS
  Filled 2019-04-28 (×3): qty 850

## 2019-04-28 MED ORDER — HYDROCORTISONE NA SUCCINATE PF 100 MG IJ SOLR
50.0000 mg | Freq: Four times a day (QID) | INTRAMUSCULAR | Status: DC
  Administered 2019-04-28 (×2): 50 mg via INTRAVENOUS
  Filled 2019-04-28 (×2): qty 2

## 2019-04-28 MED ORDER — PANTOPRAZOLE SODIUM 40 MG PO PACK: 40.0000 mg | PACK | Freq: Every day | ORAL | Status: DC

## 2019-04-28 MED ORDER — SODIUM BICARBONATE 8.4 % IV SOLN
INTRAVENOUS | Status: AC
  Administered 2019-04-28: 16:00:00
  Filled 2019-04-28: qty 50

## 2019-04-28 MED ORDER — DEXTROSE 50 % IV SOLN
INTRAVENOUS | Status: AC
  Administered 2019-04-28: 13:00:00
  Filled 2019-04-28: qty 50

## 2019-04-28 MED ORDER — SODIUM CHLORIDE 0.9 % IV SOLN: 100.0000 mg | INTRAVENOUS | Status: DC

## 2019-04-28 MED ORDER — SODIUM BICARBONATE 8.4 % IV SOLN
INTRAVENOUS | Status: AC
  Administered 2019-04-28: 09:00:00 50 meq
  Filled 2019-04-28: qty 50

## 2019-04-28 MED ORDER — SODIUM BICARBONATE 4.2 % IV SOLN
50.0000 meq | Freq: Once | INTRAVENOUS | Status: DC
  Filled 2019-04-28: qty 100

## 2019-04-28 MED ORDER — SODIUM BICARBONATE 8.4 % IV SOLN
INTRAVENOUS | Status: AC
  Administered 2019-04-28: 08:00:00 50 meq
  Filled 2019-04-28: qty 50

## 2019-04-28 MED ORDER — LEVETIRACETAM 100 MG/ML PO SOLN
250.0000 mg | Freq: Two times a day (BID) | ORAL | Status: DC
  Administered 2019-04-28: 10:00:00 250 mg
  Filled 2019-04-28 (×2): qty 2.5

## 2019-04-28 MED ORDER — SODIUM CHLORIDE 0.9 % IV SOLN
100.0000 mg | INTRAVENOUS | Status: DC
  Filled 2019-04-28: qty 20

## 2019-04-29 DIAGNOSIS — U071 COVID-19: Secondary | ICD-10-CM

## 2019-04-29 DIAGNOSIS — J80 Acute respiratory distress syndrome: Secondary | ICD-10-CM

## 2019-04-29 LAB — CULTURE, RESPIRATORY W GRAM STAIN: Gram Stain: NONE SEEN

## 2019-04-29 LAB — CULTURE, BLOOD (ROUTINE X 2)
Culture: NO GROWTH
Culture: NO GROWTH
Special Requests: ADEQUATE

## 2019-04-29 LAB — GLUCOSE, CAPILLARY
Glucose-Capillary: 59 mg/dL — ABNORMAL LOW (ref 70–99)
Glucose-Capillary: 136 mg/dL — ABNORMAL HIGH (ref 70–99)
Glucose-Capillary: 76 mg/dL (ref 70–99)

## 2019-05-05 NOTE — Progress Notes (Signed)
PCCM Interval Progress Note  [name], patient's Education officer, museum, contacted primary team. I updated her on his rapid decline including increased oxygen requirement and multiple pressor requirement. The likely cause of his condition is septic shock and acute respiratory distress syndrome secondary to COVID-19 pneumonia. Discussed DNR status which [name] agreed would be appropriate. She has sent over documents to complete to formally request DNR. I plan to complete these forms as soon as possible however in the event of his imminent death which is highly likely, I have discussed with the Ethics Committee that a two-physician invoked DNR would be appropriate.  Patient is COVID-19 positive and clinically deteriorating rapidly on bedside exam due to shock and respiratory failure related to this diagnosis. Meaningful recovery is unlikely and heroic measures such as CPR would be futile in this patient's case. Will transition patient to DNR status. Continue current medical management at this time.  [name], M.D. [facility] Pulmonary/Critical Care Medicine May 07, 2019 6:03 PM

## 2019-05-05 NOTE — Progress Notes (Signed)
Occupational Therapy Discharge Patient Details Name: [name] MRN: [id number] DOB: [birth year] Today's Date: 05/14/2019 Time:  -     Patient discharged from OT services secondary to medical decline - will need to re-order OT to resume therapy services.  Please see latest therapy progress note for current level of functioning and progress toward goals.    Progress and discharge plan discussed with patient and/or caregiver: Patient unable to participate in discharge planning and no caregivers available. OT orders discontinued.   [place], OTR/L Ricardo Pager [phone] Office [phone]  [name] 2019-05-14, 8:33 AM

## 2019-05-05 NOTE — Consult Note (Signed)
Palliative Care Reason for Consult: Ethics Requested by: CCM  I have reviewed [name]'s chart in detail and discussed his medical condition in detail with his care team. He is a long term care resident from [facility] in [place] and is a ward of the state related to severe cognitive delay and has baseline chronic health problems. He is rapidly declining due to Covid-19 infection and has progressive ARDS sepsis. His prognosis is very poor and he will not survive this hospitalization.The compassionate and humane thing to do would be to provide full comfort care in this situation. I support a medical order for DNR based on a nearly zero percent chance of benefit or surviving a resuscitation.   Time: 30 minutes Greater than 50%  of this time was spent reviewing and coordinating care related to the above assessment and plan.  Lane Hacker, DO Palliative Medicine

## 2019-05-05 NOTE — Progress Notes (Signed)
Progress Note  Patient Name: [name] Date of Encounter: May 13, 2019  Primary Cardiologist: Kirk Ruths, MD   Subjective   Sedate on vent Now COVID positive  Inpatient Medications    Scheduled Meds:  sodium chloride   Intravenous Once   chlorhexidine gluconate (MEDLINE KIT)  15 mL Mouth Rinse BID   Chlorhexidine Gluconate Cloth  6 each Topical Q0600   docusate  50 mg Per Tube Daily   feeding supplement (PRO-STAT SUGAR FREE 64)  30 mL Per Tube TID   folic acid  1 mg Per Tube Daily   heparin  7,500 Units Subcutaneous Q8H   hydrocortisone sod succinate (SOLU-CORTEF) inj  50 mg Intravenous Q6H   insulin aspart  0-9 Units Subcutaneous Q4H   insulin detemir  7 Units Subcutaneous Daily   mouth rinse  15 mL Mouth Rinse 10 times per day   mirtazapine  15 mg Per Tube QHS   multivitamin  15 mL Oral Daily   nutrition supplement (JUVEN)  1 packet Per Tube BID BM   pantoprazole (PROTONIX) IV  40 mg Intravenous Q24H   sertraline  200 mg Per Tube Daily   sodium chloride flush  3 mL Intravenous Q12H   ziprasidone  40 mg Oral QHS   Continuous Infusions:   prismasol BGK 4/2.5 400 mL/hr at 13-May-2019 0429    prismasol BGK 4/2.5 300 mL/hr at 13-May-2019 0018   sodium chloride 10 mL/hr at 2019-05-13 0900   amiodarone 30 mg/hr (13-May-2019 0900)   feeding supplement (VITAL AF 1.2 CAL) 1,000 mL (04/27/19 1516)   fluconazole (DIFLUCAN) IV Stopped (04/27/19 1245)   levETIRAcetam Stopped (04/27/19 2043)   magnesium sulfate bolus IVPB     meropenem (MERREM) IV Stopped (04/27/19 2224)   norepinephrine (LEVOPHED) Adult infusion 55 mcg/min (13-May-2019 0900)   phenylephrine (NEO-SYNEPHRINE) Adult infusion 250 mcg/min (2019-05-13 0908)   prismasol BGK 4/2.5 1,800 mL/hr at 05-13-19 0502   remdesivir 200 mg in NS 250 mL     Followed by   Derrill Memo ON 04/29/2019] remdesivir 100 mg in NS 250 mL      sodium bicarbonate (isotonic) infusion in sterile water 100 mL/hr at  05-13-2019 0907   vasopressin (PITRESSIN) infusion - *FOR SHOCK* 0.04 Units/min (2019/05/13 0900)   PRN Meds: sodium chloride, oxyCODONE **AND** acetaminophen, acetaminophen **OR** acetaminophen, bisacodyl, heparin, ipratropium-albuterol, LORazepam, magnesium sulfate bolus IVPB, morphine injection, ondansetron **OR** ondansetron (ZOFRAN) IV, oxyCODONE-acetaminophen, sennosides   Vital Signs    Vitals:   05/13/19 0700 13-May-2019 0800 May 13, 2019 0829 2019-05-13 0900  BP: (!) 78/51 (!) 73/51 (!) 86/58 93/62  Pulse:  67 71 76  Resp: (!) 24 (!) 26  (!) 24  Temp:      TempSrc:      SpO2:  92% 92% 98%  Weight:      Height:        Intake/Output Summary (Last 24 hours) at 2019-05-13 0932 Last data filed at 05/13/19 0900 Gross per 24 hour  Intake 3924.8 ml  Output 3560 ml  Net 364.8 ml   Last 3 Weights 2019/05/13 04/27/2019 04/24/2019  Weight (lbs) 175 lb 4.3 oz 174 lb 6.1 oz 180 lb  Weight (kg) 79.5 kg 79.1 kg 81.647 kg      Telemetry    Sinus rhythm, no adverse arrhythmias identified.  No further episodes of atrial fibrillation.- Personally Reviewed  ECG    No new- Personally Reviewed  Physical Exam   GEN:  Ill-appearing on ventilator Neck: No JVD Cardiac:  Telemetry with  normal rhythm.  Respiratory:  Rise and fall of chest noted with ventilator. Neuro:   Sedate Psych: Sedate  Labs    High Sensitivity Troponin:  No results for input(s): TROPONINIHS in the last 720 hours.    Chemistry Recent Labs  Lab 04/25/19 1853  04/27/19 0502 04/27/19 1551 05/01/2019 0443 2019-05-01 0842  NA 143   < > 139 136 135 140  K 3.6   < > 3.9 4.0 4.2 4.0  CL 113*   < > 109 106 106  --   CO2 20*   < > 18* 19* 14*  --   GLUCOSE 99   < > 86 121* 92  --   BUN 51*   < > 33* 24* 19  --   CREATININE 3.95*   < > 2.44* 1.76* 1.29*  --   CALCIUM 7.6*   < > 7.2* 7.2* 7.2*  --   PROT 5.1*  --   --   --   --   --   ALBUMIN 1.3*   < > 1.2* 1.1* 1.0*  --   AST 102*  --   --   --   --   --   ALT 21  --   --    --   --   --   ALKPHOS 136*  --   --   --   --   --   BILITOT 0.7  --   --   --   --   --   GFRNONAA 16*   < > 28* 42* >60  --   GFRAA 18*   < > 33* 49* >60  --   ANIONGAP 10   < > _0 --    < > = values in this interval not displayed.     Hematology Recent Labs  Lab 04/26/19 0400  04/27/19 0502 May 01, 2019 0443 01-May-2019 0842  WBC 20.9*  --  17.7* 20.2*  --   RBC 3.29*  --  3.40* 3.67*  --   HGB 8.5*   < > 8.8* 9.5* 10.2*  HCT 26.6*   < > 27.3* 31.0* 30.0*  MCV 80.9  --  80.3 84.5  --   MCH 25.8*  --  25.9* 25.9*  --   MCHC 32.0  --  32.2 30.6  --   RDW 18.6*  --  18.7* 19.7*  --   PLT 371  --  270 270  --    < > = values in this interval not displayed.    BNP Recent Labs  Lab 04/24/19 0741  BNP 1,044.4*     DDimer  Recent Labs  Lab 2019-05-01 0808  DDIMER 2.51*     Radiology    Ct Abdomen Pelvis Wo Contrast  Result Date: 04/26/2019 CLINICAL DATA:  Sepsis EXAM: CT CHEST, ABDOMEN AND PELVIS WITHOUT CONTRAST TECHNIQUE: Multidetector CT imaging of the chest, abdomen and pelvis was performed following the standard protocol without IV contrast. COMPARISON:  None. FINDINGS: CT CHEST FINDINGS Cardiovascular: Heart is normal size. Calcifications in the aorta. No aneurysm. Mediastinum/Nodes: Mildly prominent mediastinal lymph nodes, likely reactive. No axillary adenopathy. Difficult to assess the hila without intravenous contrast. Lungs/Pleura: Moderate bilateral pleural effusions. Extensive bilateral airspace disease. Air bronchograms bilaterally, right greater than left. Findings most compatible with pneumonia. Musculoskeletal: Chest wall soft tissues are unremarkable. No acute bony abnormality. CT ABDOMEN PELVIS FINDINGS Hepatobiliary: Layering gallstones within the gallbladder. No focal hepatic abnormality. Pancreas: No focal abnormality  or ductal dilatation. Spleen: No focal abnormality.  Normal size. Adrenals/Urinary Tract: No visible renal or adrenal mass. Suprapubic  catheter present within the bladder which is decompressed. No hydronephrosis. Stomach/Bowel: Stomach, large and small bowel grossly unremarkable. Vascular/Lymphatic: Aortic atherosclerosis. No enlarged abdominal or pelvic lymph nodes. Reproductive: No visible focal abnormality. Other: Perihepatic and perisplenic ascites.  No free air. Musculoskeletal: No acute bony abnormality. IMPRESSION: Dense bilateral airspace consolidations most compatible with pneumonia. Moderate bilateral pleural effusions. Cholelithiasis. Mild perihepatic and perisplenic ascites. Aortic atherosclerosis. Electronically Signed   By: Rolm Baptise M.D.   On: 04/26/2019 15:15   Ct Chest Wo Contrast  Result Date: 04/26/2019 CLINICAL DATA:  Sepsis EXAM: CT CHEST, ABDOMEN AND PELVIS WITHOUT CONTRAST TECHNIQUE: Multidetector CT imaging of the chest, abdomen and pelvis was performed following the standard protocol without IV contrast. COMPARISON:  None. FINDINGS: CT CHEST FINDINGS Cardiovascular: Heart is normal size. Calcifications in the aorta. No aneurysm. Mediastinum/Nodes: Mildly prominent mediastinal lymph nodes, likely reactive. No axillary adenopathy. Difficult to assess the hila without intravenous contrast. Lungs/Pleura: Moderate bilateral pleural effusions. Extensive bilateral airspace disease. Air bronchograms bilaterally, right greater than left. Findings most compatible with pneumonia. Musculoskeletal: Chest wall soft tissues are unremarkable. No acute bony abnormality. CT ABDOMEN PELVIS FINDINGS Hepatobiliary: Layering gallstones within the gallbladder. No focal hepatic abnormality. Pancreas: No focal abnormality or ductal dilatation. Spleen: No focal abnormality.  Normal size. Adrenals/Urinary Tract: No visible renal or adrenal mass. Suprapubic catheter present within the bladder which is decompressed. No hydronephrosis. Stomach/Bowel: Stomach, large and small bowel grossly unremarkable. Vascular/Lymphatic: Aortic atherosclerosis.  No enlarged abdominal or pelvic lymph nodes. Reproductive: No visible focal abnormality. Other: Perihepatic and perisplenic ascites.  No free air. Musculoskeletal: No acute bony abnormality. IMPRESSION: Dense bilateral airspace consolidations most compatible with pneumonia. Moderate bilateral pleural effusions. Cholelithiasis. Mild perihepatic and perisplenic ascites. Aortic atherosclerosis. Electronically Signed   By: Rolm Baptise M.D.   On: 04/26/2019 15:15   Dg Chest Port 1 View  Result Date: 04-30-19 CLINICAL DATA:  Intubation EXAM: PORTABLE CHEST 1 VIEW COMPARISON:  Portable exam 0519 hours compared to 04/26/2019 FINDINGS: Tip of endotracheal tube projects 5.7 cm above carina. Nasogastric tube extends into abdomen. RIGHT jugular central venous catheter with tip projecting over SVC. Dual-lumen LEFT jugular central venous catheter with tip projecting over SVC. Numerous EKG leads project over chest. Normal heart size and mediastinal contours. Mild volume loss in RIGHT hemithorax unchanged. Diffuse BILATERAL airspace infiltrates again identified. No pleural effusion or pneumothorax. IMPRESSION: Persistent severe diffuse BILATERAL pulmonary infiltrates. Electronically Signed   By: Lavonia Dana M.D.   On: April 30, 2019 08:50   Dg Chest Port 1 View  Result Date: 04/26/2019 CLINICAL DATA:  Hemodialysis catheter placement. EXAM: PORTABLE CHEST 1 VIEW COMPARISON:  Earlier same day FINDINGS: 1157 hours. Diffuse bilateral airspace disease again noted with volume loss right hemithorax. Endotracheal tube tip is 6.5 cm above the base of the carina. Right IJ central line tip overlies the mid SVC level. Left IJ dialysis catheter is new in the interval with tip overlying the mid-distal SVC level. No evidence for left pneumothorax although ventilator tubing overlies the left apex. The NG tube passes into the stomach although the distal tip position is not included on the film. The visualized bony structures of the thorax  are intact. Telemetry leads overlie the chest. IMPRESSION: 1. New left IJ dialysis catheter tip overlies the mid to distal SVC level. No pneumothorax. 2. Similar appearance of volume loss in the right hemithorax with  diffuse bilateral airspace disease. Electronically Signed   By: Misty Stanley M.D.   On: 04/26/2019 12:32    Cardiac Studies     Patient Profile     58 y.o. male with arrhythmia in the setting of recent AKA, pneumonia, hemodialysis now COVID +May 15, 2019  Assessment & Plan    Arrhythmias/paroxysmal atrial tachycardia/possible atrial fibrillation - Currently in sinus rhythm.  Yesterday extensive review of telemetry revealed multiple different rhythms including PACs and perhaps multifocal atrial tachycardia.  There was 1 episode of potential atrial fibrillation but this is challenging to commit as a diagnosis. - Nonetheless, continue with IV amiodarone which seems to be calming down tachycardia.  Further inotropic support agents of course given that they are beta agonist can exacerbate arrhythmias as well.  Hopefully as he continues to improve, arrhythmias will follow.  I would suspect that amiodarone would only need to be used on a short-term basis.  COVID positive -On isolation.     For questions or updates, please contact [facility] Please consult [email or web address] for contact info under        Signed, Candee Furbish, MD  05/15/19, 9:32 AM

## 2019-05-05 NOTE — Progress Notes (Signed)
Hypoglycemic Event  CBG: 59  Treatment: amp D50  Symptoms: none  Follow-up CBG: Time:1248 CBG Result:136  Possible Reasons for Event: sepsis  Comments/MD notified: Loanne Drilling- hold insulin and continue to monitor    Guadalupe Dawn

## 2019-05-05 NOTE — Progress Notes (Signed)
[facility]    NEPHROLOGY PROGRESS NOTE  SUBJECTIVE:  Tested covid positive yesterday afternoon. Intubated and sedated requiring vasopressors. Unable to obtain ROS due to intubation.     OBJECTIVE:  Vitals:   May 01, 2019 0800 05-01-19 0829  BP: (!) 73/51 (!) 86/58  Pulse: 67 71  Resp: (!) 26   Temp:    SpO2: 92% 92%    Intake/Output Summary (Last 24 hours) at 05-01-2019 2094 Last data filed at 05-01-19 0800 Gross per 24 hour  Intake 3753.91 ml  Output 3700 ml  Net 53.91 ml      General: intubated, acutely ill appearing  HEENT: ETT in place, [place]/AT, no icterus Neck:  No JVD, no adenopathy CV:  Heart RRR, no JVD Lungs: Decreased breath sounds, +wheeze/coarse breath sounds bilaterally Abd:  abd SNT/ND with normal BS GU:  Suprapubic catheter in place MSK: left AKA, no erythema or swelling to wound  MEDICATIONS:  . sodium chloride   Intravenous Once  . chlorhexidine gluconate (MEDLINE KIT)  15 mL Mouth Rinse BID  . Chlorhexidine Gluconate Cloth  6 each Topical Q0600  . docusate  50 mg Per Tube Daily  . feeding supplement (PRO-STAT SUGAR FREE 64)  30 mL Per Tube TID  . folic acid  1 mg Per Tube Daily  . heparin  5,000 Units Subcutaneous Q8H  . hydrocortisone sod succinate (SOLU-CORTEF) inj  50 mg Intravenous Q6H  . insulin aspart  0-9 Units Subcutaneous QID  . insulin aspart  2 Units Subcutaneous TID WC  . insulin detemir  7 Units Subcutaneous Daily  . mouth rinse  15 mL Mouth Rinse 10 times per day  . mirtazapine  15 mg Per Tube QHS  . multivitamin  15 mL Oral Daily  . nutrition supplement (JUVEN)  1 packet Per Tube BID BM  . pantoprazole (PROTONIX) IV  40 mg Intravenous Q24H  . sertraline  200 mg Per Tube Daily  . sodium bicarbonate      . sodium bicarbonate  50 mEq Intravenous Once  . sodium chloride flush  3 mL Intravenous Q12H  . ziprasidone  40 mg Oral QHS       LABS:   CBC Latest Ref Rng & Units 05-01-19 04/27/2019 04/26/2019  WBC 4.0 -  10.5 K/uL 20.2(H) 17.7(H) -  Hemoglobin 13.0 - 17.0 g/dL 9.5(L) 8.8(L) 8.2(L)  Hematocrit 39.0 - 52.0 % 31.0(L) 27.3(L) 24.0(L)  Platelets 150 - 400 K/uL 270 270 -    CMP Latest Ref Rng & Units 2019/05/01 04/27/2019 04/27/2019  Glucose 70 - 99 mg/dL 92 121(H) 86  BUN 6 - 20 mg/dL 19 24(H) 33(H)  Creatinine 0.61 - 1.24 mg/dL 1.29(H) 1.76(H) 2.44(H)  Sodium 135 - 145 mmol/L 135 136 139  Potassium 3.5 - 5.1 mmol/L 4.2 4.0 3.9  Chloride 98 - 111 mmol/L 106 106 109  CO2 22 - 32 mmol/L 14(L) 19(L) 18(L)  Calcium 8.9 - 10.3 mg/dL 7.2(L) 7.2(L) 7.2(L)  Total Protein 6.5 - 8.1 g/dL - - -  Total Bilirubin 0.3 - 1.2 mg/dL - - -  Alkaline Phos 38 - 126 U/L - - -  AST 15 - 41 U/L - - -  ALT 0 - 44 U/L - - -    Lab Results  Component Value Date   CALCIUM 7.2 (L) 05/01/2019   CAION 1.10 (L) 04/26/2019   PHOS 2.5 05-01-2019       Component Value Date/Time   COLORURINE YELLOW 04/26/2019 1205   APPEARANCEUR CLOUDY (A)  04/26/2019 1205   LABSPEC 1.014 04/26/2019 1205   PHURINE 5.0 04/26/2019 1205   [place] 04/26/2019 1205   HGBUR MODERATE (A) 04/26/2019 1205   BILIRUBINUR NEGATIVE 04/26/2019 1205   [place] 04/26/2019 1205   PROTEINUR 100 (A) 04/26/2019 1205   UROBILINOGEN 0.2 01/21/2012 1250   NITRITE NEGATIVE 04/26/2019 1205   LEUKOCYTESUR LARGE (A) 04/26/2019 1205      Component Value Date/Time   PHART 7.237 (L) 04/26/2019 1524   PCO2ART 41.1 04/26/2019 1524   PO2ART 66.0 (L) 04/26/2019 1524   HCO3 17.6 (L) 04/26/2019 1524   TCO2 19 (L) 04/26/2019 1524   ACIDBASEDEF 9.0 (H) 04/26/2019 1524   O2SAT 90.0 04/26/2019 1524       Component Value Date/Time   IRON 13 (L) 05/03/2019 1756   TIBC 162 (L) 05/03/2019 1756   FERRITIN 1,671 (H) 04/12/2019 1756   IRONPCTSAT 8 (L) 04/20/2019 1756       ASSESSMENT/PLAN:     58yo non-verbal M with autism, CKD stage II, chronic suprapubic catheter for urethral strictures under guardianship admitted for acute anemia after  planned CTA for left heel ulcer. Developed gangrene requiring AKA. Received vancomycin. Developed AKI and worsening respiratory distress with transfer to ICU. Renal US showed worsening of renal atrophy. Being treated for aspiration pneumonia with covid + test yesterday.   Acute Kidney Injury Baseline CKD Stage II with Cr thought to be ~2.5. AKI multifactorial with ischemic and pre-renal insults including hypotension, sepsis, and medications progressing to anuria on 8/22 with initiation of CRRT Currently requiring multiple vasopressors with ongoing hypotension.    - cont. CRRT  Anion-Gapped Metabolic Acidosis Worsened with lactic acidosis trending up yesterday. Corrected for hypoalbuminemia gap is 21. Will continue CRRT with further management per primary.   CKD Stage III  Most recent prior to admission Cr 2.6, eGFR 30 02/24/19 while at rehab but no trend available. 2.16 at admission.   Chronic Suprapubic Catheter Possible candidal cystitis with small amount yeast cultured. Diflucan started per ID.   Acute Hypoxic Respiratory Failure 2/2 Aspiration Pneumonia and COVID-19 Intubated for respiratory insufficiency. Being treated for aspiration pneumonia, diagnosed covid-19 positive yesterday. On full support with O2 now 100%. On remdesevir and steroids.   Septic Shock 2/2 Pneumonia Candidal Cystitis  Covid+ On meropenem per ID, diflucan stopped. Resp culture 8/23 reincubated, blood cx 8/21 with NGTD. Covid+ with treatment as above.   Arrythmia Sinus tach with atrial tachycardia and some PACs which started shortly after beginning CRRT. Cardiology consulted and plan to continue amiodarone.   Anemia 2/2 ABL and Chronic Disease  S/p prbcs. Acute from recent surgery and chronic 2/2 CKD and IDA s/p iron infusion prior to admission. Hemoglobin currently stable.   Marty Heck, DO 17-May-2019, 8:52 AM Pager: [phone]

## 2019-05-05 NOTE — Progress Notes (Signed)
PCCM Interval Progress Note  Asked by Dr. Loanne Drilling to call [name] regarding goals of care as pt is ward of state [phone]).  Social worker on his case is [employer].  No answer despite multiple calls.   [name], [facility] Pager: [phone].  If no answer, [phone] 2019-05-28, 4:23 PM

## 2019-05-05 NOTE — Death Summary Note (Signed)
DEATH SUMMARY   Patient Details  Name: [name] MRN: [id number] DOB: 31-Dec-1960  Admission/Discharge Information   Admit Date:  Apr 22, 2019  Date of Death: Date of Death: 10-May-2019  Time of Death: Time of Death: 12-08-13  Length of Stay: Nov 29, 2022  Referring Physician: Rosita Fire, MD   Reason(s) for Hospitalization  Gangrene of left heel  Diagnoses  Preliminary cause of death: Septic shock and acute respiratory distress syndrome secondary to COVID-19 pneumonia Secondary Diagnoses (including complications and co-morbidities):  Principal Problem:   COVID-19 virus infection Active Problems:   Aspiration pneumonia ([place])   Intellectual disability   Acute kidney injury superimposed on CKD ([place])   Chronic indwelling Foley catheter   Diabetes mellitus out of control (HCC)   Anemia, blood loss   Leukocytosis   History of seizure   Gangrene ([place])   Pressure injury of skin   Folate deficiency   Gas gangrene of foot ([place])   Postoperative fever   AKI (acute kidney injury) ([place])   Goals of care, counseling/discussion   Palliative care encounter   Acute respiratory distress syndrome (ARDS) due to COVID-19 virus   Brief Hospital Course (including significant findings, care, treatment, and services provided and events leading to death)  [name] is a 58 y.o. year old male who was initially admitted for progressive gangrene of the left heel, acute on chronic blood loss anemia and AKI with metabolic acidosis. He was started on IVF and IV antibiotics. 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 Post-op patient had episode of emesis, fever and worsening leukocytosis. CXR obtained and demonstrated right-sided opacity. He was started on broad-spectrum antibiotics for suspected aspiration/HCAP/left heel gangrene. ID consulted for persistent fever. On 8/21 patient had respiratory decompensation and started on BiPAP for work of breathing and transferred to  the ICU for respiratory failure and AKI. On 8/22 patient was intubated and on 8/23 dialysis catheter placed and started on CRRT. Also started on pressor support for suspected septic shock. CT Chest obtained and showed dense bilateral airspace consolidations concerning for pneumonia. Patient's guardian updated team that she was being quarantined and tested for COVID as well as other residents in patient's SNF. On 8/24 SARS COVID-19 test returned positive. Patient continued to deteriorate with increased O2 and pressor requirement until medically maxed.   Patient expired on 12/08/1813.  Pertinent Labs and Studies  Significant Diagnostic Studies Ct Abdomen Pelvis Wo Contrast  Result Date: 04/26/2019 CLINICAL DATA:  Sepsis EXAM: CT CHEST, ABDOMEN AND PELVIS WITHOUT CONTRAST TECHNIQUE: Multidetector CT imaging of the chest, abdomen and pelvis was performed following the standard protocol without IV contrast. COMPARISON:  None. FINDINGS: CT CHEST FINDINGS Cardiovascular: Heart is normal size. Calcifications in the aorta. No aneurysm. Mediastinum/Nodes: Mildly prominent mediastinal lymph nodes, likely reactive. No axillary adenopathy. Difficult to assess the hila without intravenous contrast. Lungs/Pleura: Moderate bilateral pleural effusions. Extensive bilateral airspace disease. Air bronchograms bilaterally, right greater than left. Findings most compatible with pneumonia. Musculoskeletal: Chest wall soft tissues are unremarkable. No acute bony abnormality. CT ABDOMEN PELVIS FINDINGS Hepatobiliary: Layering gallstones within the gallbladder. No focal hepatic abnormality. Pancreas: No focal abnormality or ductal dilatation. Spleen: No focal abnormality.  Normal size. Adrenals/Urinary Tract: No visible renal or adrenal mass. Suprapubic catheter present within the bladder which is decompressed. No hydronephrosis. Stomach/Bowel: Stomach, large and small bowel grossly unremarkable. Vascular/Lymphatic: Aortic atherosclerosis.  No enlarged abdominal or pelvic lymph nodes. Reproductive: No visible focal abnormality. Other: Perihepatic and perisplenic ascites.  No free  air. Musculoskeletal: No acute bony abnormality. IMPRESSION: Dense bilateral airspace consolidations most compatible with pneumonia. Moderate bilateral pleural effusions. Cholelithiasis. Mild perihepatic and perisplenic ascites. Aortic atherosclerosis. Electronically Signed   By: Rolm Baptise M.D.   On: 04/26/2019 15:15   Dg Abd 1 View  Result Date: 04/15/2019 CLINICAL DATA:  Emesis EXAM: ABDOMEN - 1 VIEW COMPARISON:  None. FINDINGS: Scattered large and small bowel gas is noted. No abnormal mass or abnormal calcifications are seen. The degree of dilatation has improved in the interval from the prior exam. No free air is noted. IMPRESSION: Persistent large and small bowel gas although the degree of small bowel dilatation has improved in the interval. No free air is noted. Electronically Signed   By: Inez Catalina M.D.   On: 04/15/2019 09:18   Ct Chest Wo Contrast  Result Date: 04/26/2019 CLINICAL DATA:  Sepsis EXAM: CT CHEST, ABDOMEN AND PELVIS WITHOUT CONTRAST TECHNIQUE: Multidetector CT imaging of the chest, abdomen and pelvis was performed following the standard protocol without IV contrast. COMPARISON:  None. FINDINGS: CT CHEST FINDINGS Cardiovascular: Heart is normal size. Calcifications in the aorta. No aneurysm. Mediastinum/Nodes: Mildly prominent mediastinal lymph nodes, likely reactive. No axillary adenopathy. Difficult to assess the hila without intravenous contrast. Lungs/Pleura: Moderate bilateral pleural effusions. Extensive bilateral airspace disease. Air bronchograms bilaterally, right greater than left. Findings most compatible with pneumonia. Musculoskeletal: Chest wall soft tissues are unremarkable. No acute bony abnormality. CT ABDOMEN PELVIS FINDINGS Hepatobiliary: Layering gallstones within the gallbladder. No focal hepatic abnormality. Pancreas: No  focal abnormality or ductal dilatation. Spleen: No focal abnormality.  Normal size. Adrenals/Urinary Tract: No visible renal or adrenal mass. Suprapubic catheter present within the bladder which is decompressed. No hydronephrosis. Stomach/Bowel: Stomach, large and small bowel grossly unremarkable. Vascular/Lymphatic: Aortic atherosclerosis. No enlarged abdominal or pelvic lymph nodes. Reproductive: No visible focal abnormality. Other: Perihepatic and perisplenic ascites.  No free air. Musculoskeletal: No acute bony abnormality. IMPRESSION: Dense bilateral airspace consolidations most compatible with pneumonia. Moderate bilateral pleural effusions. Cholelithiasis. Mild perihepatic and perisplenic ascites. Aortic atherosclerosis. Electronically Signed   By: Rolm Baptise M.D.   On: 04/26/2019 15:15   US Renal  Result Date: 04/24/2019 CLINICAL DATA:  Acute kidney injury. Peripheral vascular disease with gangrene. EXAM: RENAL / URINARY TRACT ULTRASOUND COMPLETE COMPARISON:  01/30/2012 renal ultrasound FINDINGS: Right Kidney: Renal measurements: 9.0 x 3.7 x 4.2 centimeters = volume: 92.9 mL . Echogenicity within normal limits. No mass or hydronephrosis visualized. Left Kidney: Renal measurements: 9.3 x 5.4 x 5.6 centimeters = volume: 148.7 mL. Echogenicity within normal limits. No mass or hydronephrosis visualized. Bladder: Bladder is decompressed by a Foley catheter. Additional: Study quality is degraded by patient motion artifact. IMPRESSION: 1. No acute abnormality. 2. Kidneys measure slightly smaller compared to prior. Kidney now 9.0 centimeters and previously 10.3 and 11.2 centimeters. LEFT kidney now measures 9.3 centimeters, previously 10.8 and 10.5 centimeters. 3. No hydronephrosis. 4. Foley catheter. Electronically Signed   By: Nolon Nations M.D.   On: 04/24/2019 13:32   Dg Chest Port 1 View  Result Date: 05-14-2019 CLINICAL DATA:  Intubation EXAM: PORTABLE CHEST 1 VIEW COMPARISON:  Portable exam 0519  hours compared to 04/26/2019 FINDINGS: Tip of endotracheal tube projects 5.7 cm above carina. Nasogastric tube extends into abdomen. RIGHT jugular central venous catheter with tip projecting over SVC. Dual-lumen LEFT jugular central venous catheter with tip projecting over SVC. Numerous EKG leads project over chest. Normal heart size and mediastinal contours. Mild volume loss in RIGHT hemithorax  unchanged. Diffuse BILATERAL airspace infiltrates again identified. No pleural effusion or pneumothorax. IMPRESSION: Persistent severe diffuse BILATERAL pulmonary infiltrates. Electronically Signed   By: Lavonia Dana M.D.   On: 2019-05-28 08:50   Dg Chest Port 1 View  Result Date: 04/26/2019 CLINICAL DATA:  Hemodialysis catheter placement. EXAM: PORTABLE CHEST 1 VIEW COMPARISON:  Earlier same day FINDINGS: 1157 hours. Diffuse bilateral airspace disease again noted with volume loss right hemithorax. Endotracheal tube tip is 6.5 cm above the base of the carina. Right IJ central line tip overlies the mid SVC level. Left IJ dialysis catheter is new in the interval with tip overlying the mid-distal SVC level. No evidence for left pneumothorax although ventilator tubing overlies the left apex. The NG tube passes into the stomach although the distal tip position is not included on the film. The visualized bony structures of the thorax are intact. Telemetry leads overlie the chest. IMPRESSION: 1. New left IJ dialysis catheter tip overlies the mid to distal SVC level. No pneumothorax. 2. Similar appearance of volume loss in the right hemithorax with diffuse bilateral airspace disease. Electronically Signed   By: Misty Stanley M.D.   On: 04/26/2019 12:32   Dg Chest Port 1 View  Result Date: 04/26/2019 CLINICAL DATA:  Patient status post ET tube placement. EXAM: PORTABLE CHEST 1 VIEW COMPARISON:  Chest radiograph 04/25/2019 FINDINGS: ETT terminates in the mid trachea. Enteric tube courses anterior to the diaphragm. Right IJ  central venous catheter tip projects over the superior vena cava. Monitoring leads overlie the patient. Stable cardiac and mediastinal contours. Similar-appearing bilateral airspace opacities. No definite pleural effusion or pneumothorax. IMPRESSION: Similar-appearing diffuse bilateral airspace consolidation. ET tube mid trachea. Electronically Signed   By: Lovey Newcomer M.D.   On: 04/26/2019 10:40   Portable Chest X-ray  Result Date: 04/25/2019 CLINICAL DATA:  ET tube and NG tube placement EXAM: PORTABLE CHEST 1 VIEW COMPARISON:  04/25/2019 FINDINGS: Interval placement of right central line with the tip in the SVC. No pneumothorax endotracheal tube tip is 7.5 cm above the carina. NG tube enters the stomach. Bilateral airspace opacities are noted, right greater than left. Small right effusion and moderate left effusion. Heart is normal size. Airspace disease slightly worsened since prior study. IMPRESSION: Support devices as above. Diffuse bilateral airspace disease, right greater than left, increasing since prior study. Small right effusion and moderate left effusion. Electronically Signed   By: Rolm Baptise M.D.   On: 04/25/2019 17:32   Dg Chest Port 1 View  Result Date: 04/25/2019 CLINICAL DATA:  BiPAP. EXAM: PORTABLE CHEST 1 VIEW COMPARISON:  Chest radiograph 04/24/2019 FINDINGS: Monitoring leads overlie the patient. Stable cardiac and mediastinal contours. Similar diffuse bilateral areas of consolidation. Probable small bilateral pleural effusions. No definite pneumothorax. IMPRESSION: Similar diffuse bilateral consolidative opacities compatible with pneumonia. Small bilateral pleural effusions. Electronically Signed   By: Lovey Newcomer M.D.   On: 04/25/2019 09:17   Dg Chest Port 1 View  Result Date: 04/24/2019 CLINICAL DATA:  Respiratory distress, pneumonia EXAM: PORTABLE CHEST 1 VIEW COMPARISON:  04/22/2019 FINDINGS: Patchy bilateral airspace opacities throughout both lungs, right greater than left  have increased slightly since prior study. Small left pleural effusion and moderate right pleural effusion, similar prior study. Heart is borderline in size. IMPRESSION: Patchy bilateral airspace opacities, right greater than left most compatible with pneumonia. Stable bilateral effusions also greater on the right. Electronically Signed   By: Rolm Baptise M.D.   On: 04/24/2019 11:36   Dg Chest  Port 1 View  Result Date: 04/23/2019 CLINICAL DATA:  Fever, wheezing. EXAM: PORTABLE CHEST 1 VIEW COMPARISON:  Radiograph April 21, 2019. FINDINGS: Stable cardiomediastinal silhouette. No pneumothorax is noted. Mildly increased bilateral perihilar opacities are noted concerning for possible edema or pneumonia. Increased mild loculated right pleural effusion is noted. Bony thorax is unremarkable. IMPRESSION: Mildly increased bilateral perihilar opacities are noted concerning for possible edema or pneumonia. Increased mild loculated right pleural effusion is noted. Electronically Signed   By: Marijo Conception M.D.   On: 04/23/2019 09:48   Dg Chest Port 1 View  Result Date: 05/04/2019 CLINICAL DATA:  Fever. EXAM: PORTABLE CHEST 1 VIEW COMPARISON:  Chest radiograph 04/13/2019 FINDINGS: Development of large right perihilar opacity with volume loss in the right lung. Suspected right pleural effusion. Development of heterogeneous left infrahilar opacities. Heart is normal in size. Unchanged mediastinal contours. No pneumothorax. No acute osseous abnormality. IMPRESSION: 1. Development of large right perihilar opacity, concerning for pneumonia. An element of atelectasis/collapse is also considered in the setting of volume loss. Right pleural effusion. 2. Heterogeneous left lung base opacities may be atelectasis or pneumonia. Electronically Signed   By: Keith Rake M.D.   On: 04/19/2019 22:14   Dg Chest Port 1 View  Result Date: 04/13/2019 CLINICAL DATA:  Leukocytosis. EXAM: PORTABLE CHEST 1 VIEW COMPARISON:  Chest  radiograph and CT 03/12/2019 FINDINGS: The patient is slightly rotated to the right. The cardiomediastinal silhouette is within normal limits. Minimal linear opacity in the left lung base suggests atelectasis. The lungs are otherwise clear. No sizable pleural effusion or pneumothorax is identified. No acute osseous abnormality is seen. IMPRESSION: No active disease. Electronically Signed   By: Logan Bores M.D.   On: 04/13/2019 08:51   Dg Abd 2 Views  Result Date: 04/14/2019 CLINICAL DATA:  Nausea and vomiting today, no pain EXAM: ABDOMEN - 2 VIEW COMPARISON:  Chest radiograph 1 day prior FINDINGS: Diffuse air-filled bowel loops throughout the abdomen several of which measure greater than 3 cm. Air and stool projects over the rectal vault. No free intraperitoneal air. No suspicious calcifications overlying the urinary tract or gallbladder fossa. Vascular calcium is noted in the pelvis. The bones are diffusely demineralized. Degenerative changes are present in the lower lumbar spine and pelvic girdle. IMPRESSION: Diffuse air distended loops bowel throughout the abdomen could reflect developing ileus, obstruction is less favored given the diffuse appearance and air and stool projecting over the rectal vault. Electronically Signed   By: Lovena Le M.D.   On: 04/14/2019 14:33   Dg Abd Portable 1v  Result Date: 04/25/2019 CLINICAL DATA:  ETT and NG tube placement EXAM: PORTABLE ABDOMEN - 1 VIEW COMPARISON:  04/15/2019 FINDINGS: Enteric tube terminates in the proximal gastric body. Residual contrast within visualized bowel. IMPRESSION: Enteric tube terminates in the proximal gastric body. Electronically Signed   By: Julian Hy M.D.   On: 04/25/2019 17:38   Dg Swallowing Func-speech Pathology  Result Date: 04/23/2019 Objective Swallowing Evaluation: Type of Study: MBS-Modified Barium Swallow Study  Patient Details Name: Branigan Scholes MRN: [id number] Date of Birth: [birth year] Today's Date: 04/23/2019  Time: SLP Start Time (ACUTE ONLY): 1340 -SLP Stop Time (ACUTE ONLY): 1400 SLP Time Calculation (min) (ACUTE ONLY): 20 min Past Medical History: Past Medical History: Diagnosis Date . Autism  . BPH (benign prostatic hyperplasia)  . Depression  . Diabetes mellitus  . Elective mutism  . Gastroparesis  . Hyperglycemia  . Hyperosmolar (nonketotic) coma ([place])  . Hyponatremia  .  Mental retardation  . Mental retardation  . Pneumonia  . Renal disorder  . Urinary retention  . UTI (urinary tract infection)  Past Surgical History: Past Surgical History: Procedure Laterality Date . AMPUTATION Left 04/17/2019  Procedure: AMPUTATION ABOVE KNEE LEFT LEG;  Surgeon: Angelia Mould, MD;  Location: [facility] OR;  Service: Vascular;  Laterality: Left; HPI: 58 y.o.male, SNF resident,with medical history significant forsevere autism with elective mutism, schizoaffective disorder, diabetes mellitus type 2, chronic kidney disease, chronic suprapubic catheter for urinary strictures, admitted with left heel gangrene.  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 MBS from June 2013 with moderate dysphagia after acute hospitalization with multiple intubations.   Pt noted by nursing to be coughing frequently with meals.   Subjective: pleasant, cooperative Assessment / Plan / Recommendation CHL IP CLINICAL IMPRESSIONS 04/23/2019 Clinical Impression Paient presents with a mild oral and a mild-moderate pharyngeal dysphagia. During oral phase, patient exhibited mastication delay with regular solids and delay in anterior to posterior transit of puree and regular solids boluses with piecemeal swallowing. Pharyngeal phase consisted of delays in swallow initiation to level of vallecular sinus with all tested boluses and flash penetration with thin liquids via cup or straw sip, which occured approximately 50% of the time, but with full clearance of penetrate.  SLP Visit Diagnosis Dysphagia, oropharyngeal phase (R13.12) Attention and concentration deficit following -- Frontal  lobe and executive function deficit following -- Impact on safety and function Mild aspiration risk   CHL IP TREATMENT RECOMMENDATION 04/23/2019 Treatment Recommendations Therapy as outlined in treatment plan below   Prognosis 04/23/2019 Prognosis for Safe Diet Advancement Good Barriers to Reach Goals Cognitive deficits;Severity of deficits Barriers/Prognosis Comment -- CHL IP DIET RECOMMENDATION 04/23/2019 SLP Diet Recommendations Regular solids;Thin liquid Liquid Administration via Cup;Straw Medication Administration Whole meds with liquid Compensations Minimize environmental distractions;Slow rate;Small sips/bites Postural Changes --   CHL IP OTHER RECOMMENDATIONS 02/12/2012 Recommended Consults -- Oral Care Recommendations -- Other Recommendations Order thickener from pharmacy;Clarify dietary restrictions   CHL IP FOLLOW UP RECOMMENDATIONS 04/23/2019 Follow up Recommendations Other (comment)   CHL IP FREQUENCY AND DURATION 04/23/2019 Speech Therapy Frequency (ACUTE ONLY) min 2x/week Treatment Duration 1 week      CHL IP ORAL PHASE 04/23/2019 Oral Phase Impaired Oral - Pudding Teaspoon -- Oral - Pudding Cup -- Oral - Honey Teaspoon -- Oral - Honey Cup -- Oral - Nectar Teaspoon -- Oral - Nectar Cup -- Oral - Nectar Straw -- Oral - Thin Teaspoon NT Oral - Thin Cup WFL Oral - Thin Straw WFL Oral - Puree Reduced posterior propulsion;Delayed oral transit;Piecemeal swallowing Oral - Mech Soft -- Oral - Regular Piecemeal swallowing;Delayed oral transit;Impaired mastication Oral - Multi-Consistency -- Oral - Pill -- Oral Phase - Comment --  CHL IP PHARYNGEAL PHASE 04/23/2019 Pharyngeal Phase Impaired Pharyngeal- Pudding Teaspoon -- Pharyngeal -- Pharyngeal- Pudding Cup -- Pharyngeal -- Pharyngeal- Honey Teaspoon -- Pharyngeal -- Pharyngeal- Honey Cup -- Pharyngeal -- Pharyngeal- Nectar Teaspoon -- Pharyngeal -- Pharyngeal- Nectar Cup -- Pharyngeal -- Pharyngeal- Nectar Straw -- Pharyngeal -- Pharyngeal- Thin Teaspoon --  Pharyngeal -- Pharyngeal- Thin Cup Delayed swallow initiation-vallecula;Penetration/Aspiration during swallow Pharyngeal Material enters airway, remains ABOVE vocal cords then ejected out Pharyngeal- Thin Straw Penetration/Aspiration during swallow;Delayed swallow initiation-vallecula Pharyngeal Material enters airway, remains ABOVE vocal cords then ejected out Pharyngeal- Puree Delayed swallow initiation-vallecula Pharyngeal -- Pharyngeal- Mechanical Soft -- Pharyngeal -- Pharyngeal- Regular Delayed swallow initiation-vallecula Pharyngeal -- Pharyngeal- Multi-consistency -- Pharyngeal -- Pharyngeal- Pill -- Pharyngeal -- Pharyngeal Comment --  CHL IP CERVICAL  ESOPHAGEAL PHASE 04/23/2019 Cervical Esophageal Phase WFL Pudding Teaspoon -- Pudding Cup -- Honey Teaspoon -- Honey Cup -- Nectar Teaspoon -- Nectar Cup -- Nectar Straw -- Thin Teaspoon -- Thin Cup -- Thin Straw -- Puree -- Mechanical Soft -- Regular -- Multi-consistency -- Pill -- Cervical Esophageal Comment -- Dannial Monarch 04/23/2019, 4:32 PM  Sonia Baller, MA, CCC-SLP Speech Therapy [facility] Acute Rehab Pager: [phone]             Vas [place] Abi With/wo Tbi  Result Date: 04/23/2019 LOWER EXTREMITY DOPPLER STUDY Indications: Peripheral artery disease.  Vascular Interventions: Left AKA. Comparison Study: No previous study available for comparison Performing Technologist: Birdena Crandall, [place]  Examination Guidelines: A complete evaluation includes at minimum, Doppler waveform signals and systolic blood pressure reading at the level of bilateral brachial, anterior tibial, and posterior tibial arteries, when vessel segments are accessible. Bilateral testing is considered an integral part of a complete examination. Photoelectric Plethysmograph (PPG) waveforms and toe systolic pressure readings are included as required and additional duplex testing as needed. Limited examinations for reoccurring indications may be performed as noted.  ABI Findings:  +---------+------------------+-----+---------+----------------+ Right    Rt Pressure (mmHg)IndexWaveform Comment          +---------+------------------+-----+---------+----------------+ Brachial 155                    triphasic                 +---------+------------------+-----+---------+----------------+ PTA      255               1.65 biphasic non compressible +---------+------------------+-----+---------+----------------+ DP       255               1.65 triphasicnon compressible +---------+------------------+-----+---------+----------------+ Great Toe51                0.33                           +---------+------------------+-----+---------+----------------+ +---------+------------------+-----+---------+-------+ Left     Lt Pressure (mmHg)IndexWaveform Comment +---------+------------------+-----+---------+-------+ Brachial 115                    triphasic        +---------+------------------+-----+---------+-------+ PTA                                      AKA     +---------+------------------+-----+---------+-------+ DP                                       AKA     +---------+------------------+-----+---------+-------+ Great Toe                                AKA     +---------+------------------+-----+---------+-------+ +-------+-----------+-----------+------------+------------+ ABI/TBIToday's ABIToday's TBIPrevious ABIPrevious TBI +-------+-----------+-----------+------------+------------+ Right  1.65       0.33       no previous no previous  +-------+-----------+-----------+------------+------------+ Left   AKA                   no previous no previous  +-------+-----------+-----------+------------+------------+ Arterial wall calcification precludes accurate ankle pressures and ABIs.  Summary: Right: Resting right ankle-brachial index indicates noncompressible right lower extremity arteries.The right  toe-brachial index is  abnormal. Left: Left Above the knee amputation.  *See table(s) above for measurements and observations.  Electronically signed by Servando Snare MD on 04/23/2019 at 1:25:35 PM.    Final    Vas [place] Lower Extremity Arterial Duplex  Result Date: 04/24/2019 LOWER EXTREMITY ARTERIAL DUPLEX STUDY Indications: Peripheral artery disease.  Current ABI: Non compressible Limitations: Positionimg of the patient and respiratory interference.              Overpowering venous signal Comparison       No right lower extremity arterial duplex available for Study:           comparison Performing Technologist: Toma Copier RVS  Examination Guidelines: A complete evaluation includes B-mode imaging, spectral Doppler, color Doppler, and power Doppler as needed of all accessible portions of each vessel. Bilateral testing is considered an integral part of a complete examination. Limited examinations for reoccurring indications may be performed as noted.  +----------+--------+-----+--------+---------+---------------------------------+ RIGHT     PSV cm/sRatioStenosisWaveform Comments                          +----------+--------+-----+--------+---------+---------------------------------+ CFA Prox  93                   triphasicmild plaque                       +----------+--------+-----+--------+---------+---------------------------------+ DFA       107                  triphasicmild plaque                       +----------+--------+-----+--------+---------+---------------------------------+ SFA Prox  120                  triphasicmild plaque                       +----------+--------+-----+--------+---------+---------------------------------+ SFA Mid   92                   triphasicmild plaque                       +----------+--------+-----+--------+---------+---------------------------------+ SFA Distal100                  triphasicmild plaque                        +----------+--------+-----+--------+---------+---------------------------------+ POP Prox  78                   triphasicmild plaque                       +----------+--------+-----+--------+---------+---------------------------------+ POP Distal79                   biphasic mild plaque                       +----------+--------+-----+--------+---------+---------------------------------+ ATA Distal37                                                              +----------+--------+-----+--------+---------+---------------------------------+ PTA Prox  50  Difficult to evaluate waveforms                                           due to strong venous signal                                               moderate plaque                   +----------+--------+-----+--------+---------+---------------------------------+ PTA Mid   29                            mild plaque                       +----------+--------+-----+--------+---------+---------------------------------+ PTA Distal66                                                              +----------+--------+-----+--------+---------+---------------------------------+  Summary: Right: ABIs are unreliable. There is no obvious evidence of significant stenosis. However there is calcification of the arteries worsening moving distally from the thigh to the ankle.  See table(s) above for measurements and observations. Electronically signed by Monica Martinez MD on 04/24/2019 at 4:13:19 PM.    Final     Microbiology Recent Results (from the past 240 hour(s))  Culture, blood (routine x 2)     Status: None   Collection Time: 05/02/2019 10:18 PM   Specimen: BLOOD LEFT HAND  Result Value Ref Range Status   Specimen Description BLOOD LEFT HAND  Final   Special Requests   Final    BOTTLES DRAWN AEROBIC AND ANAEROBIC Blood Culture adequate volume   Culture   Final    NO GROWTH 5  DAYS Performed at [facility], [address] [place], [place] [address]    Report Status 04/26/2019 FINAL  Final  Culture, blood (routine x 2)     Status: None   Collection Time: 04/12/2019 10:25 PM   Specimen: BLOOD RIGHT HAND  Result Value Ref Range Status   Specimen Description BLOOD RIGHT HAND  Final   Special Requests   Final    BOTTLES DRAWN AEROBIC AND ANAEROBIC Blood Culture adequate volume   Culture   Final    NO GROWTH 5 DAYS Performed at [facility], [place] [address] [place], [place] [address]    Report Status 04/26/2019 FINAL  Final  Culture, Urine     Status: Abnormal   Collection Time: 04/22/19  8:46 AM   Specimen: Urine, Suprapubic  Result Value Ref Range Status   Specimen Description URINE, SUPRAPUBIC  Final   Special Requests   Final    NONE Performed at [facility], [place] [address], [place], [place] [address]    Culture 70,000 COLONIES/mL YEAST (A)  Final   Report Status 04/23/2019 FINAL  Final  MRSA PCR Screening     Status: None   Collection Time: 04/22/19  3:56 PM   Specimen: Nasal Mucosa; Nasopharyngeal  Result Value Ref Range  Status   MRSA by PCR NEGATIVE NEGATIVE Final    Comment:        The GeneXpert MRSA Assay (FDA approved for NASAL specimens only), is one component of a comprehensive MRSA colonization surveillance program. It is not intended to diagnose MRSA infection nor to guide or monitor treatment for MRSA infections. Performed at [facility], [place] [address] [place], [place] [address]   Culture, blood (routine x 2)     Status: None   Collection Time: 04/24/19  1:35 PM   Specimen: BLOOD  Result Value Ref Range Status   Specimen Description BLOOD RIGHT ANTECUBITAL  Final   Special Requests   Final    BOTTLES DRAWN AEROBIC ONLY Blood Culture adequate volume   Culture   Final    NO GROWTH 5 DAYS Performed at [facility], [place] [address], [place], [place] [address]    Report Status 04/29/2019 FINAL   Final  Culture, blood (routine x 2)     Status: None   Collection Time: 04/24/19  1:35 PM   Specimen: BLOOD RIGHT HAND  Result Value Ref Range Status   Specimen Description BLOOD RIGHT HAND  Final   Special Requests   Final    BOTTLES DRAWN AEROBIC ONLY Blood Culture results may not be optimal due to an inadequate volume of blood received in culture bottles   Culture   Final    NO GROWTH 5 DAYS Performed at [facility], [place] [address] [place], [place] [address]    Report Status 04/29/2019 FINAL  Final  Culture, respiratory     Status: None   Collection Time: 04/26/19 11:59 AM   Specimen: Endotracheal  Result Value Ref Range Status   Specimen Description ENDOTRACHEAL  Final   Special Requests NONE  Final   Gram Stain   Final    NO WBC SEEN NO ORGANISMS SEEN Performed at [facility], [place] [address], [place], [place] [address]    Culture FEW CANDIDA ALBICANS FEW ENTEROCOCCUS FAECALIS   Final   Report Status 04/29/2019 FINAL  Final   Organism ID, Bacteria ENTEROCOCCUS FAECALIS  Final      Susceptibility   Enterococcus faecalis - MIC*    AMPICILLIN <=2 SENSITIVE Sensitive     VANCOMYCIN 2 SENSITIVE Sensitive     GENTAMICIN SYNERGY RESISTANT Resistant     * FEW ENTEROCOCCUS FAECALIS  SARS CORONAVIRUS 2 (TAT 6-12 HRS)     Status: Abnormal   Collection Time: 04/27/19  5:55 PM  Result Value Ref Range Status   SARS Coronavirus 2 POSITIVE (A) NEGATIVE Final    Comment: (NOTE) SARS-CoV-2 target nucleic acids are DETECTED. The SARS-CoV-2 RNA is generally detectable in upper and lower respiratory specimens during the acute phase of infection. Positive results are indicative of active infection with SARS-CoV-2. Clinical  correlation with patient history and other diagnostic information is necessary to determine patient infection status. Positive results do  not rule out bacterial infection or co-infection with other viruses. The expected result is Negative. Fact Sheet  for Patients: SugarRoll.be Fact Sheet for Healthcare Providers: https://www.woods-mathews.com/ This test is not yet approved or cleared by the [place] FDA and  has been authorized for detection and/or diagnosis of SARS-CoV-2 by FDA under an Emergency Use Authorization (EUA). This EUA will remain  in effect (meaning this test can be used) for the duration of the COVID-19 declaration under Section 564(b)(1) of the Act, 21 U.S.C.  section 360bbb-3(b)(1), unless  the authorization is terminated or revoked sooner. Performed at [facility], [place] [address] [place], [place] [address]     Lab Basic Metabolic Panel: Recent Labs  Lab 04/25/19 0018  04/26/19 1022  04/26/19 1728 04/27/19 0502 04/27/19 1551 May 01, 2019 0443 05/01/2019 0842 05-01-2019 1034 2019-05-01 1520 2019/05/01 1552  NA 142   < >  --    < > 143 139 136 135 140 140 143 141  K 3.8   < >  --    < > 3.9 3.9 4.0 4.2 4.0 4.0 4.1 4.1  CL 113*   < >  --   --  113* 109 106 106  --   --   --  104  CO2 16*   < >  --   --  17* 18* 19* 14*  --   --   --  13*  GLUCOSE 186*   < >  --   --  139* 86 121* 92  --   --   --  62*  BUN 49*   < >  --   --  54* 33* 24* 19  --   --   --  12  CREATININE 3.60*   < >  --   --  3.71* 2.44* 1.76* 1.29*  --   --   --  1.06  CALCIUM 8.0*   < >  --   --  7.3* 7.2* 7.2* 7.2*  --   --   --  7.0*  MG 1.8  --  1.9  --  2.0 2.2 2.3  --   --   --   --   --   PHOS 3.6  --  4.7*  --  4.3 2.7 1.9* 2.5  --   --   --  3.7   < > = values in this interval not displayed.   Liver Function Tests: Recent Labs  Lab 04/25/19 1853  04/27/19 0502 04/27/19 1551 05-01-2019 0443 2019/05/01 1551 2019/05/01 1552  AST 102*  --   --   --   --  <5*  --   ALT 21  --   --   --   --  32  --   ALKPHOS 136*  --   --   --   --  304*  --   BILITOT 0.7  --   --   --   --  1.0  --   PROT 5.1*  --   --   --   --  3.6*  --   ALBUMIN 1.3*   < > 1.2* 1.1* 1.0* <1.0* <1.0*   < > = values in  this interval not displayed.   No results for input(s): LIPASE, AMYLASE in the last 168 hours. No results for input(s): AMMONIA in the last 168 hours. CBC: Recent Labs  Lab 04/24/19 0543  04/25/19 1853 04/26/19 0400  04/27/19 0502 2019/05/01 0443 05-01-19 0842 05/01/19 1034 05/01/19 1520  WBC 12.7*  --  20.8* 20.9*  --  17.7* 20.2*  --   --   --   NEUTROABS 11.0*  --  20.0* 19.1*  --  16.4* 18.8*  --   --   --   HGB 7.5*   < > 7.8* 8.5*   < > 8.8* 9.5* 10.2* 10.2* 9.5*  HCT 23.9*   < > 24.1* 26.6*   < > 27.3* 31.0* 30.0* 30.0* 28.0*  MCV 82.4  --  79.8* 80.9  --  80.3 84.5  --   --   --   PLT 320  --  330 371  --  270 270  --   --   --    < > = values in this interval not displayed.   Cardiac Enzymes: No results for input(s): CKTOTAL, CKMB, CKMBINDEX, TROPONINI in the last 168 hours. Sepsis Labs: Recent Labs  Lab 04/25/19 1853 04/26/19 0400 04/27/19 0502 04/27/19 1149 04/27/19 1440 2019/05/11 0443  WBC 20.8* 20.9* 17.7*  --   --  20.2*  LATICACIDVEN  --   --   --  3.7* 4.8*  --     Procedures/Operations  8/22 Intubation 8/22 Dialysis cathether placement   Louanna Vanliew Rodman Pickle 04/29/2019, 8:46 PM

## 2019-05-05 NOTE — Progress Notes (Signed)
    7 days S/P left AKA Viable stump Retained staples Plan for f/u in 3 weeks for staple removal and incision check.  Roxy Horseman PA-C

## 2019-05-05 NOTE — Progress Notes (Signed)
Physical Therapy Discharge Patient Details Name: [name] MRN: [id number] DOB: Dec 28, 1960 Today's Date: May 11, 2019 Time:  -     Patient discharged from PT services secondary to medical decline - will need to re-order PT to resume therapy services.  Please see latest therapy progress note for current level of functioning and progress toward goals.    Progress and discharge plan discussed with patient and/or caregiver: Patient unable to participate in discharge planning and no caregivers available  [place] 2019/05/11, 8:48 AM  [place] Pager [phone] Office [phone]

## 2019-05-05 NOTE — Progress Notes (Signed)
[facility] for Infectious Disease  Date of Admission:  04/14/2019     Total days of antibiotics 19         ASSESSMENT/PLAN  Mr. Varricchio remains critically ill requiring mechanical ventilation, CRRT and vasopressor support. Now with positive coronavirus test and started on remdesivir. This gives plausible explanation of why there has not been any improvements with broad spectrum antibiotics. Discussed with Dr. Loanne Drilling and will continue current dose of meropenem given his continued worsening status and stop the fluconazole. Surgical wound remains without evidence of infection. Notified Infection Prevention as Mr. Roots is a resident of [facility] facility.  1. Continue remdesivir and meropenem.  2. Discontinue fluconazole  3. Ventilatory and vasopressor support per CCM. 4. CCRT per nephrology.   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 sodium chloride   Intravenous Once  . chlorhexidine gluconate (MEDLINE KIT)  15 mL Mouth Rinse BID  . Chlorhexidine Gluconate Cloth  6 each Topical Q0600  . docusate  50 mg Per Tube Daily  . feeding supplement (PRO-STAT SUGAR FREE 64)  30 mL Per Tube TID  . folic acid  1 mg Per Tube Daily  . heparin  7,500 Units Subcutaneous Q8H  . hydrocortisone sod succinate (SOLU-CORTEF) inj  50 mg Intravenous Q6H  . insulin aspart  0-9 Units Subcutaneous Q4H  . insulin detemir  7 Units Subcutaneous Daily  . levETIRAcetam  250 mg Per Tube BID  . mouth rinse  15 mL Mouth Rinse 10 times per day  . mirtazapine  15 mg Per  Tube QHS  . multivitamin  15 mL Oral Daily  . nutrition supplement (JUVEN)  1 packet Per Tube BID BM  . pantoprazole sodium  40 mg Per Tube QHS  . sertraline  200 mg Per Tube Daily  . sodium chloride flush  3 mL Intravenous Q12H  . ziprasidone  40 mg Oral QHS    SUBJECTIVE:  Hypothermic overnight with continued leukocytosis. Continues to have worsening acidosis. Sars-CoV-2 testing now positive. He is intubated and not responding at present.   No Known Allergies   Review of Systems: Review of Systems  Unable to perform ROS: Critical illness      OBJECTIVE: Vitals:   2019/05/14 0900 05/14/19 1000 05/14/19 1033 05/14/19 1100  BP: 93/62 (!) 88/61 (!) 116/38 100/64  Pulse: 76 75 74 75  Resp: (!) 24 (!) 25 (!) 25 (!) 25  Temp:      TempSrc:      SpO2: 98% 94% 94% 94%  Weight:      Height:       Body mass index is 25.15 kg/m.  Physical Exam Constitutional:      General: He is not in acute distress.    Appearance: He is well-developed. He is ill-appearing and toxic-appearing.     Interventions: He is intubated.  Cardiovascular:     Rate and Rhythm: Normal rate and regular rhythm.     Heart sounds: Normal heart sounds.  Comments: Dialysis catheter and central line appear with dressings that are clean and dry. No evidence of infection.  Pulmonary:     Effort: Pulmonary effort is normal. He is intubated.     Breath sounds: Rhonchi and rales present.     Comments: Ventilator setting PRVC with 100% O2 and 14 Peep Skin:    General: Skin is warm and dry.  Neurological:     Mental Status: He is alert and oriented to person, place, and time.  Psychiatric:        Behavior: Behavior normal.        Thought Content: Thought content normal.        Judgment: Judgment normal.     Lab Results Lab Results  Component Value Date   WBC 20.2 (H) May 24, 2019   HGB 10.2 (L) 2019-05-24   HCT 30.0 (L) 05/24/19   MCV 84.5 2019/05/24   PLT 270 24-May-2019    Lab Results   Component Value Date   CREATININE 1.29 (H) 05-24-19   BUN 19 05-24-2019   NA 140 2019-05-24   K 4.0 24-May-2019   CL 106 05-24-2019   CO2 14 (L) 05-24-2019    Lab Results  Component Value Date   ALT 21 04/25/2019   AST 102 (H) 04/25/2019   ALKPHOS 136 (H) 04/25/2019   BILITOT 0.7 04/25/2019     Microbiology: Recent Results (from the past 240 hour(s))  Culture, blood (routine x 2)     Status: None   Collection Time: 04/25/2019 10:18 PM   Specimen: BLOOD LEFT HAND  Result Value Ref Range Status   Specimen Description BLOOD LEFT HAND  Final   Special Requests   Final    BOTTLES DRAWN AEROBIC AND ANAEROBIC Blood Culture adequate volume   Culture   Final    NO GROWTH 5 DAYS Performed at [facility], [address], [place], [place] [address]    Report Status 04/26/2019 FINAL  Final  Culture, blood (routine x 2)     Status: None   Collection Time: 04/26/2019 10:25 PM   Specimen: BLOOD RIGHT HAND  Result Value Ref Range Status   Specimen Description BLOOD RIGHT HAND  Final   Special Requests   Final    BOTTLES DRAWN AEROBIC AND ANAEROBIC Blood Culture adequate volume   Culture   Final    NO GROWTH 5 DAYS Performed at [facility], [place] [address] [place], [place] [address]    Report Status 04/26/2019 FINAL  Final  Culture, Urine     Status: Abnormal   Collection Time: 04/22/19  8:46 AM   Specimen: Urine, Suprapubic  Result Value Ref Range Status   Specimen Description URINE, SUPRAPUBIC  Final   Special Requests   Final    NONE Performed at [facility], [place] [address], [place], [place] [address]    Culture 70,000 COLONIES/mL YEAST (A)  Final   Report Status 04/23/2019 FINAL  Final  MRSA PCR Screening     Status: None   Collection Time: 04/22/19  3:56 PM   Specimen: Nasal Mucosa; Nasopharyngeal  Result Value Ref Range Status   MRSA by PCR NEGATIVE NEGATIVE Final    Comment:        The GeneXpert MRSA Assay (FDA approved for NASAL specimens only),  is one component of a comprehensive MRSA colonization surveillance program. It is not intended to diagnose MRSA infection nor to guide or monitor treatment for MRSA infections. Performed at [facility], [place] [address], [place], [place] [address]   Culture, blood (routine x 2)     Status: None (Preliminary result)   Collection Time: 04/24/19  1:35 PM   Specimen: BLOOD  Result Value Ref Range Status   Specimen Description BLOOD RIGHT ANTECUBITAL  Final   Special Requests   Final    BOTTLES DRAWN AEROBIC ONLY Blood Culture adequate volume   Culture   Final    NO GROWTH 4 DAYS Performed at [facility], [place] [address], [place], [place] [address]    Report Status PENDING  Incomplete  Culture, blood (routine x 2)     Status: None (Preliminary result)   Collection Time: 04/24/19  1:35 PM   Specimen: BLOOD RIGHT HAND  Result Value Ref Range Status   Specimen Description BLOOD RIGHT HAND  Final   Special Requests   Final    BOTTLES DRAWN AEROBIC ONLY Blood Culture results may not be optimal due to an inadequate volume of blood received in culture bottles   Culture   Final    NO GROWTH 4 DAYS Performed at [facility], [place] [address] [place], [place] [address]    Report Status PENDING  Incomplete  Culture, respiratory     Status: None (Preliminary result)   Collection Time: 04/26/19 11:59 AM   Specimen: Endotracheal  Result Value Ref Range Status   Specimen Description ENDOTRACHEAL  Final   Special Requests NONE  Final   Gram Stain NO WBC SEEN NO ORGANISMS SEEN   Final   Culture   Final    CULTURE REINCUBATED FOR BETTER GROWTH Performed at [facility], [address], [place], [place] [address]    Report Status PENDING  Incomplete  SARS CORONAVIRUS 2 (TAT 6-12 HRS)     Status: Abnormal   Collection Time: 04/27/19  5:55 PM  Result Value Ref Range Status   SARS Coronavirus 2 POSITIVE (A) NEGATIVE Final    Comment: (NOTE) SARS-CoV-2 target nucleic  acids are DETECTED. The SARS-CoV-2 RNA is generally detectable in upper and lower respiratory specimens during the acute phase of infection. Positive results are indicative of active infection with SARS-CoV-2. Clinical  correlation with patient history and other diagnostic information is necessary to determine patient infection status. Positive results do  not rule out bacterial infection or co-infection with other viruses. The expected result is Negative. Fact Sheet for Patients: SugarRoll.be Fact Sheet for Healthcare Providers: https://www.woods-mathews.com/ This test is not yet approved or cleared by the [place] FDA and  has been authorized for detection and/or diagnosis of SARS-CoV-2 by FDA under an Emergency Use Authorization (EUA). This EUA will remain  in effect (meaning this test can be used) for the duration of the COVID-19 declaration under Section 564(b)(1) of the Act, 21 U.S.C.  section 360bbb-3(b)(1), unless the authorization is terminated or revoked sooner. Performed at [facility], [place] [address] [place], [place] [address]      Terri Piedra, [place] [phone] Pager  05-27-2019  11:38 AM

## 2019-05-05 NOTE — Progress Notes (Signed)
Pt BP 60's/30s maxed on levophed, neosynephrine, and vasopressin. Notified Dr. Loanne Drilling, received orders to give amp of bicarb, obtain ABG, increase bicarb gtt to 197ml/hr, ask RT to increase RR on vent up to 35 if able. ABG results pH 6.85, pCO2 55.5, pO2 64, HCO3 12.3.

## 2019-05-05 NOTE — Progress Notes (Signed)
NAME:  [name], MRN:  [id number], DOB:  07/14/1961, LOS: 40 ADMISSION DATE:  04/07/2019, CONSULTATION DATE:  May 16, 2019 REFERRING MD: Dr. Horris Latino , CHIEF COMPLAINT:   Acute hypoxic respiratory failure  Brief History   58 y/o M admitted 8/7 for gangrenous, non-healing L foot ulcer, eventually underwent L AKA, developed likely aspiration PNA.  PCCM consulted for worsening respiratory status requiring Bipap, worsening sepsis 2/2 likely aspiration PNA and UTI,  intubated 8/22, CRRT started 8/23.    History of present illness   59 y.o. M with PMH of severe autism and elective mutism, anemia on iron infusions outpatient, Type 2 DM, CKD, Schizoaffective disorder, and suprapubic catheter for urinary strictures, wheelchair bound who was admitted on 04/09/2019 for non-healing L heel ulcer.  This initially began in June 2020 per notes and  was previously treated outpatient and underwent surgical debridement on 6/25.   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 Scot Dock.  He has been treated with IV abx, noted to have some fevers and chills despite surgery, and CXR showed possible R opacity. Seen by ID, concern for possible aspiration and was continued on Cefepime and Flagyl.  He has also been anemic since admission, received 2 units PRBC's on 8/7 and one unit on 8/19.    pt noted to be febrile to 101.9 and was having worsening respiratory distress and required Bipap.  He was not hypoxic by pulse ox, but  ABG concerning for PO2 of 40, so PCCM was consulted.  Past Medical History  Schizoaffective Disorder, severe autism and selective mutism, CKD, indwelling suprapubic catheter  Significant Hospital Events   04/23/2019 Admitted to hospitalists 8/18 L AKA 8/21 Respiratory distress, txfr to ICU 8/22 respiratory status and mental status cont to worsen despite diuresis, intubated.  Started on low dose neosynephrine/  8/23: started CRRT, Suprapubic catheter changed   Consults:   8/7 Vascular Surgery  8/20 Infectious Disease 8/21 PCCM  Procedures:  8/18 L AKA  Significant Diagnostic Tests:  8/20 Echocardiogram>>EF 60-65%, unable to assess RV systolic pressure, but normal RV systolic function [id number] CXR>>patchy bilateral infiltrates increased since CXR on 8/19 8/21 Renal US>> 8/23 CT chest abd pelv: Dense bilateral airspace consolidations most compatible with pneumonia. Moderate bilateral pleural effusions. Cholelithiasis. Mild perihepatic and perisplenic ascites. Aortic atherosclerosis.  Micro Data:  8/7 Sars-CoV-2>>neg 8/12 BCx2>>negative  8/18 Sars-CoV-2>>negative 8/18 BCx2>> 8/19 UC>>70,000 colonies yeast 8/19 MRSA>>neg 8/21 BCx2>>pending 8/23 RCx>>pending 8/24 Sars-CoV-2 > Positive  Antimicrobials:  Zosyn 8/7>8/13 Ceftriaxone 8/14>8/18 Vancomycin 8/7>8/11, 8/18 x1 Flagyl 8/15>8/23  Cefepime 8/18> 8/23 Diflucan 8/23> Meropenem 8/24 >>  Interim history/subjective:  Worsening hypotension. Remains on high vent settings  Objective   Blood pressure (!) 78/51, pulse 78, temperature (!) 94.4 F (34.7 C), temperature source Axillary, resp. rate (!) 24, height 5\' 10"  (1.778 m), weight 79.5 kg, SpO2 90 %.   Vent Mode: PRVC FiO2 (%):  [90 %-100 %] 90 % Set Rate:  [22 bmp] 22 bmp Vt Set:  [510 mL] 510 mL PEEP:  [14 cmH20] 14 cmH20 Plateau Pressure:  [20 cmH20-26 cmH20] 20 cmH20   Intake/Output Summary (Last 24 hours) at May 16, 2019 0738 Last data filed at May 16, 2019 0700 Gross per 24 hour  Intake 3735.97 ml  Output 3771 ml  Net -35.03 ml   Filed Weights   04/24/19 0433 04/27/19 0500 2019-05-16 0500  Weight: 81.6 kg 79.1 kg 79.5 kg   Physical Exam: General: Critically ill-appearing, obtunded HENT: [place], AT, ETT in  place Eyes: EOMI, no scleral icterus Respiratory: Diminished breath sounds. Coarse breath sounds bilaterally Cardiovascular: RRR, -M/R/G, no JVD GI: BS+, soft, nontender Extremities: S/p left AKA, stump wound c/d/i Neuro:  Obtunded GU: Suprapubic catheter in place  Resolved Hospital Problem list   L heel ulcer  Assessment & Plan:   ARDS due to COVID-19 pneumonia c/b pulmonary edema and co-comitant bacterial pneumonia: Worsening hypoxemia Airborne and contact precautions Continue mechanical ventilation per ARDS protocol with TV 6-8cc/kg and Pplat <30 Target PaO2 55-80: titrate PEEP/FiO2 per protocol Ventilator associated pneumonia prevention protocol Holding on volume removal via CRRT due to hypotension Continue antibiotics per ID ABG reviewed. Increased TV to 8cc/kg Repeat ABG in 1 hour  COVID-19 and aspiration pneumonia Administer Remdesivir protocol x 5 days Continue meropenem and diflucan. Appreciate ID input Follow-up cultures Trend D-dimer, ferritin and CRP If D-dimer >5 ug/ml +/- critical illness, start weight based DVT ppx  Septic Shock secondary to COVID-19 pneumonia Start stress dose steroids Continue pressor support for MAP goal >65: Levophed, Vaso and Neo   Tachycardia Telemetry Appreciate Cardiology input Continue amiodarone gtt  Acute on chronic renal failure with metabolic acidosis Holding on volume removal via CRRT due to shock Start sodium bicarbonate gtt Appreciate nephrology input  Acute toxic metabolic encephalopathy: Concerned for CVA Hx seizures: No evidence of active seizures Continue keppra BID Holding on MRI due to critical illness  S/p left AKA amputation Post-op care per Vascular Surgery  Anemia:  Cont to monitor closely.  Possibly 2/2 renal failure.  HB 8.8 today.   Type 2 DM -continue Levemir and SSI  Urinary strictures s/p suprapubic exchange on 8/23 Monitor UOP  Scrotal edema and skin erosions:  Small sacral wound.   Cont to monitor. Volume removal being attempted with CRRT.  Rectal tube in place to prevent moisture and stool in area.   Schizoaffective Disorder, Autism, selective mutism -Continue  Geodon (switch to per G tube) and prn Ativan,  remeron qhS, zoloft daily    Best practice:  Diet: NPO, Tube feeds  Pain/Anxiety/Delirium protocol (if indicated): Geodon prn meds for pain or agitation. Patient is very fearful of hospitals and MDs at baseline.  VAP protocol (if indicated): n/a DVT prophylaxis: heparin TID GI prophylaxis: protonix Glucose control: SSI Mobility: bed rest Code Status: Full Code Family Communication: [place] Disposition:  ICU  Labs   CBC: Recent Labs  Lab 04/24/19 0543  04/25/19 1853 04/26/19 0400 04/26/19 1359 04/26/19 1524 04/27/19 0502 2019/05/10 0443  WBC 12.7*  --  20.8* 20.9*  --   --  17.7* 20.2*  NEUTROABS 11.0*  --  20.0* 19.1*  --   --  16.4* 18.8*  HGB 7.5*   < > 7.8* 8.5* 8.5* 8.2* 8.8* 9.5*  HCT 23.9*   < > 24.1* 26.6* 25.0* 24.0* 27.3* 31.0*  MCV 82.4  --  79.8* 80.9  --   --  80.3 84.5  PLT 320  --  330 371  --   --  270 270   < > = values in this interval not displayed.    Basic Metabolic Panel: Recent Labs  Lab 04/25/19 0018  04/26/19 0400 04/26/19 1022  04/26/19 1524 04/26/19 1728 04/27/19 0502 04/27/19 1551 05-10-2019 0443  NA 142   < > 143  --    < > 146* 143 139 136 135  K 3.8   < > 4.2  --    < > 3.9 3.9 3.9 4.0 4.2  CL 113*   < >  114*  --   --   --  113* 109 106 106  CO2 16*   < > 19*  --   --   --  17* 18* 19* 14*  GLUCOSE 186*   < > 94  --   --   --  139* 86 121* 92  BUN 49*   < > 53*  --   --   --  54* 33* 24* 19  CREATININE 3.60*   < > 4.31*  --   --   --  3.71* 2.44* 1.76* 1.29*  CALCIUM 8.0*   < > 7.5*  --   --   --  7.3* 7.2* 7.2* 7.2*  MG 1.8  --   --  1.9  --   --  2.0 2.2 2.3  --   PHOS 3.6  --   --  4.7*  --   --  4.3 2.7 1.9* 2.5   < > = values in this interval not displayed.   GFR: Estimated Creatinine Clearance: 65.2 mL/min (A) (by C-G formula based on SCr of 1.29 mg/dL (H)). Recent Labs  Lab 04/07/2019 2234 04/22/19 0202  04/25/19 1853 04/26/19 0400 04/27/19 0502 04/27/19 1149 04/27/19 1440 05-14-19 0443  WBC 12.3* 12.1*   <  > 20.8* 20.9* 17.7*  --   --  20.2*  LATICACIDVEN 3.0* 0.9  --   --   --   --  3.7* 4.8*  --    < > = values in this interval not displayed.    Liver Function Tests: Recent Labs  Lab 04/25/19 1853 04/26/19 1728 04/27/19 0502 04/27/19 1551 May 14, 2019 0443  AST 102*  --   --   --   --   ALT 21  --   --   --   --   ALKPHOS 136*  --   --   --   --   BILITOT 0.7  --   --   --   --   PROT 5.1*  --   --   --   --   ALBUMIN 1.3* 1.2* 1.2* 1.1* 1.0*   No results for input(s): LIPASE, AMYLASE in the last 168 hours. No results for input(s): AMMONIA in the last 168 hours.  ABG    Component Value Date/Time   PHART 7.237 (L) 04/26/2019 1524   PCO2ART 41.1 04/26/2019 1524   PO2ART 66.0 (L) 04/26/2019 1524   HCO3 17.6 (L) 04/26/2019 1524   TCO2 19 (L) 04/26/2019 1524   ACIDBASEDEF 9.0 (H) 04/26/2019 1524   O2SAT 90.0 04/26/2019 1524     Coagulation Profile: No results for input(s): INR, PROTIME in the last 168 hours.  Cardiac Enzymes: No results for input(s): CKTOTAL, CKMB, CKMBINDEX, TROPONINI in the last 168 hours.  HbA1C: Hgb A1c MFr Bld  Date/Time Value Ref Range Status  04/11/2019 04:09 AM 6.7 (H) 4.8 - 5.6 % Final    Comment:    (NOTE) Pre diabetes:          5.7%-6.4% Diabetes:              >6.4% Glycemic control for   <7.0% adults with diabetes   10/22/2011 09:12 AM 8.0 (H) <5.7 % Final    Comment:    (NOTE)  According to the ADA Clinical Practice Recommendations for 2011, when HbA1c is used as a screening test:  >=6.5%   Diagnostic of Diabetes Mellitus           (if abnormal result is confirmed) 5.7-6.4%   Increased risk of developing Diabetes Mellitus References:Diagnosis and Classification of Diabetes Mellitus,Diabetes [id number] 1):S62-S69 and Standards of Medical Care in         Diabetes - 2011,Diabetes [id number] (Suppl 1):S11-S61.    CBG: Recent Labs  Lab 04/27/19 1115  04/27/19 1524 04/27/19 2031 04/27/19 2331 05/13/19 0433  GLUCAP 96 111* 93 121* 87    CC Time: 50 min  The patient is critically ill with multiple organ systems failure and requires high complexity decision making for assessment and support, frequent evaluation and titration of therapies, application of advanced monitoring technologies and extensive interpretation of multiple databases.   Critical Care Time devoted to patient care services described in this note is 50 Minutes.   Rodman Pickle, M.D. [facility] Pulmonary/Critical Care Medicine 05/13/19 7:38 AM  Pager: [phone] After hours pager: [phone]

## 2019-05-05 NOTE — Progress Notes (Signed)
Asked by primary attending Dr. Loanne Drilling to evaluate patient as a second opinion for code status.  Patient evaluated.  Regardless of baseline, he is maxed on 3 pressors with refractory shock to the point that he can not tolerate CRRT.  He is maxed out on full vent support with PEEP of 14 and FiO2 100% with persistent hypoxemia on maximum support.  I do not believe that the patient will survive today.  Conversation between Dr. Loanne Drilling and Guardian/social worker was such that the patient is to remain full code until paper work is filled.  I do not believe that coding this patient is appropriate and in my opinion would constitute cruel and unusual measures and futile and should never be offered.  Spoke with Dr. Armandina Gemma, she will leave a note with opinion regarding futility of care.  I do believe that DNR status is the appropriate course of action purely based on futility of care.  No further escalation of care at this point.  Will place DNR order.  Discussed with Dr. Loanne Drilling, Dr. Hilma Favors and bedside RN.  The patient is critically ill with multiple organ systems failure and requires high complexity decision making for assessment and support, frequent evaluation and titration of therapies, application of advanced monitoring technologies and extensive interpretation of multiple databases.   Critical Care Time devoted to patient care services described in this note is  40  Minutes. This time reflects time of care of this signee Dr Jennet Maduro. This critical care time does not reflect procedure time, or teaching time or supervisory time of PA/NP/Med student/Med Resident etc but could involve care discussion time.  Rush Farmer, M.D. [facility] Pulmonary/Critical Care Medicine. Pager: [phone]. After hours pager: [phone].

## 2019-05-05 NOTE — Consult Note (Signed)
[facility] Nurse wound consult note Reason for Consult: Scrotal edema.  S/P L AKA, managed by surgical services and not a source of fever.   Wound type: Inflammatory  Edema to scrotum Pressure Injury POA: NA Measurement: generalized edema to scrotum.  S/P catheter in place, chronically.  Wound bed:N/A Drainage (amount, consistency, odor) Scant weeping from edema Periwound:intact   Dressing procedure/placement/frequency: Roll kerlix beneath scrotum to provide elevation.   Will not follow at this time.  Please re-consult if needed.  Domenic Moras MSN, RN, FNP-BC CWON Wound, Ostomy, Continence Nurse Pager [phone]

## 2019-05-05 DEATH — deceased

## 2019-05-19 ENCOUNTER — Telehealth: Payer: Self-pay

## 2019-05-19 NOTE — Telephone Encounter (Signed)
Received dc from [facility].  Dc is for cremation and a patient of Doctor Loanne Drilling.  DC will be taken to [facility] ICU for signature.  On 05/20/2019 Received dc back from Doctor Loanne Drilling. I called the funeral home to let them know the dc is ready for pickup.
